# Patient Record
Sex: Male | Born: 1950 | Race: White | Hispanic: No | Smoking: Current every day smoker
Health system: Southern US, Community
[De-identification: ages and names within clinical notes are randomized; demographics above are authoritative.]

## PROBLEM LIST (undated history)

## (undated) DIAGNOSIS — L819 Disorder of pigmentation, unspecified: Secondary | ICD-10-CM

## (undated) DIAGNOSIS — R270 Ataxia, unspecified: Secondary | ICD-10-CM

## (undated) DIAGNOSIS — F329 Major depressive disorder, single episode, unspecified: Secondary | ICD-10-CM

## (undated) DIAGNOSIS — I739 Peripheral vascular disease, unspecified: Secondary | ICD-10-CM

## (undated) DIAGNOSIS — M4640 Discitis, unspecified, site unspecified: Secondary | ICD-10-CM

## (undated) DIAGNOSIS — E7800 Pure hypercholesterolemia, unspecified: Secondary | ICD-10-CM

## (undated) DIAGNOSIS — F411 Generalized anxiety disorder: Secondary | ICD-10-CM

## (undated) DIAGNOSIS — M79606 Pain in leg, unspecified: Secondary | ICD-10-CM

## (undated) DIAGNOSIS — F32A Depression, unspecified: Secondary | ICD-10-CM

## (undated) DIAGNOSIS — G8929 Other chronic pain: Secondary | ICD-10-CM

## (undated) DIAGNOSIS — F17200 Nicotine dependence, unspecified, uncomplicated: Secondary | ICD-10-CM

## (undated) DIAGNOSIS — R7301 Impaired fasting glucose: Secondary | ICD-10-CM

## (undated) DIAGNOSIS — I10 Essential (primary) hypertension: Secondary | ICD-10-CM

## (undated) DIAGNOSIS — M549 Dorsalgia, unspecified: Secondary | ICD-10-CM

## (undated) DIAGNOSIS — T847XXA Infection and inflammatory reaction due to other internal orthopedic prosthetic devices, implants and grafts, initial encounter: Secondary | ICD-10-CM

## (undated) DIAGNOSIS — H3320 Serous retinal detachment, unspecified eye: Secondary | ICD-10-CM

## (undated) DIAGNOSIS — D126 Benign neoplasm of colon, unspecified: Secondary | ICD-10-CM

## (undated) DIAGNOSIS — R030 Elevated blood-pressure reading, without diagnosis of hypertension: Secondary | ICD-10-CM

## (undated) DIAGNOSIS — G5640 Causalgia of unspecified upper limb: Secondary | ICD-10-CM

## (undated) DIAGNOSIS — M1990 Unspecified osteoarthritis, unspecified site: Secondary | ICD-10-CM

## (undated) DIAGNOSIS — L270 Generalized skin eruption due to drugs and medicaments taken internally: Secondary | ICD-10-CM

## (undated) HISTORY — DX: Nicotine dependence, unspecified, uncomplicated: F17.200

## (undated) HISTORY — PX: ANKLE SURGERY: SHX546

## (undated) HISTORY — DX: Depression, unspecified: F32.A

## (undated) HISTORY — DX: Pure hypercholesterolemia, unspecified: E78.00

## (undated) HISTORY — DX: Causalgia of unspecified upper limb: G56.40

## (undated) HISTORY — DX: Disorder of pigmentation, unspecified: L81.9

## (undated) HISTORY — DX: Infection and inflammatory reaction due to other internal orthopedic prosthetic devices, implants and grafts, initial encounter: T84.7XXA

## (undated) HISTORY — DX: Major depressive disorder, single episode, unspecified: F32.9

## (undated) HISTORY — PX: APPENDECTOMY: SHX54

## (undated) HISTORY — DX: Benign neoplasm of colon, unspecified: D12.6

## (undated) HISTORY — PX: COLONOSCOPY: SHX174

## (undated) HISTORY — DX: Elevated blood-pressure reading, without diagnosis of hypertension: R03.0

## (undated) HISTORY — PX: KNEE SURGERY: SHX244

## (undated) HISTORY — DX: Essential (primary) hypertension: I10

## (undated) HISTORY — DX: Peripheral vascular disease, unspecified: I73.9

## (undated) HISTORY — DX: Impaired fasting glucose: R73.01

## (undated) HISTORY — PX: BACK SURGERY: SHX140

## (undated) HISTORY — DX: Serous retinal detachment, unspecified eye: H33.20

## (undated) HISTORY — DX: Generalized anxiety disorder: F41.1

## (undated) HISTORY — DX: Generalized skin eruption due to drugs and medicaments taken internally: L27.0

## (undated) HISTORY — DX: Discitis, unspecified, site unspecified: M46.40

---

## 2001-07-11 ENCOUNTER — Encounter: Admission: RE | Admit: 2001-07-11 | Discharge: 2001-07-11 | Payer: Self-pay | Admitting: Family Medicine

## 2001-07-11 ENCOUNTER — Encounter: Payer: Self-pay | Admitting: Family Medicine

## 2002-05-04 ENCOUNTER — Encounter: Payer: Self-pay | Admitting: Orthopaedic Surgery

## 2002-05-04 ENCOUNTER — Ambulatory Visit (HOSPITAL_COMMUNITY): Admission: RE | Admit: 2002-05-04 | Discharge: 2002-05-04 | Payer: Self-pay | Admitting: Orthopaedic Surgery

## 2002-07-10 ENCOUNTER — Ambulatory Visit (HOSPITAL_BASED_OUTPATIENT_CLINIC_OR_DEPARTMENT_OTHER): Admission: RE | Admit: 2002-07-10 | Discharge: 2002-07-11 | Payer: Self-pay | Admitting: Orthopedic Surgery

## 2002-10-09 ENCOUNTER — Ambulatory Visit (HOSPITAL_BASED_OUTPATIENT_CLINIC_OR_DEPARTMENT_OTHER): Admission: RE | Admit: 2002-10-09 | Discharge: 2002-10-09 | Payer: Self-pay | Admitting: Orthopedic Surgery

## 2002-12-11 ENCOUNTER — Encounter (HOSPITAL_COMMUNITY): Admission: RE | Admit: 2002-12-11 | Discharge: 2003-01-10 | Payer: Self-pay | Admitting: *Deleted

## 2003-05-14 ENCOUNTER — Encounter: Admission: RE | Admit: 2003-05-14 | Discharge: 2003-06-25 | Payer: Self-pay | Admitting: *Deleted

## 2004-11-04 ENCOUNTER — Ambulatory Visit (HOSPITAL_COMMUNITY): Admission: RE | Admit: 2004-11-04 | Discharge: 2004-11-04 | Payer: Self-pay | Admitting: Gastroenterology

## 2009-06-05 ENCOUNTER — Emergency Department (HOSPITAL_COMMUNITY): Admission: EM | Admit: 2009-06-05 | Discharge: 2009-06-05 | Payer: Self-pay | Admitting: Emergency Medicine

## 2010-05-22 ENCOUNTER — Emergency Department (HOSPITAL_COMMUNITY)
Admission: EM | Admit: 2010-05-22 | Discharge: 2010-05-22 | Payer: Self-pay | Source: Home / Self Care | Admitting: Emergency Medicine

## 2010-07-02 ENCOUNTER — Encounter: Payer: Medicare Other | Attending: Family Medicine | Admitting: *Deleted

## 2010-07-02 DIAGNOSIS — Z713 Dietary counseling and surveillance: Secondary | ICD-10-CM | POA: Insufficient documentation

## 2010-07-02 DIAGNOSIS — R7301 Impaired fasting glucose: Secondary | ICD-10-CM | POA: Insufficient documentation

## 2010-07-23 LAB — POCT CARDIAC MARKERS
CKMB, poc: 2.2 ng/mL (ref 1.0–8.0)
Troponin i, poc: 0.07 ng/mL (ref 0.00–0.09)

## 2010-07-23 LAB — BASIC METABOLIC PANEL
BUN: 9 mg/dL (ref 6–23)
CO2: 30 mEq/L (ref 19–32)
Calcium: 9.3 mg/dL (ref 8.4–10.5)
Chloride: 103 mEq/L (ref 96–112)
Creatinine, Ser: 0.96 mg/dL (ref 0.4–1.5)
GFR calc Af Amer: >60 mL/min (ref >60)
GFR calc non Af Amer: >60 mL/min (ref >60)
Glucose, Bld: 100 mg/dL — ABNORMAL HIGH (ref 70–99)
Potassium: 3.4 mEq/L — ABNORMAL LOW (ref 3.5–5.1)
Sodium: 140 mEq/L (ref 135–145)

## 2010-07-23 LAB — CBC
Hemoglobin: 14.6 g/dL (ref 13.0–17.0)
MCHC: 33.9 g/dL (ref 30.0–36.0)
RBC: 4.41 MIL/uL (ref 4.22–5.81)
WBC: 6 10*3/uL (ref 4.0–10.5)

## 2010-07-23 LAB — DIFFERENTIAL
Basophils Absolute: 0 10*3/uL (ref 0.0–0.1)
Basophils Relative: 1 % (ref 0–1)
Lymphocytes Relative: 37 % (ref 12–46)
Monocytes Relative: 11 % (ref 3–12)
Neutro Abs: 3 10*3/uL (ref 1.7–7.7)
Neutrophils Relative %: 50 % (ref 43–77)

## 2010-07-23 LAB — HEPATIC FUNCTION PANEL
ALT: 19 U/L (ref 0–53)
AST: 24 U/L (ref 0–37)
Albumin: 4.1 g/dL (ref 3.5–5.2)
Total Bilirubin: 0.4 mg/dL (ref 0.3–1.2)

## 2010-07-23 LAB — PROTIME-INR: INR: 1 (ref 0.00–1.49)

## 2010-08-11 ENCOUNTER — Encounter: Payer: Medicare Other | Attending: Family Medicine | Admitting: *Deleted

## 2010-08-11 DIAGNOSIS — Z713 Dietary counseling and surveillance: Secondary | ICD-10-CM | POA: Insufficient documentation

## 2010-08-11 DIAGNOSIS — R7301 Impaired fasting glucose: Secondary | ICD-10-CM | POA: Insufficient documentation

## 2010-09-10 ENCOUNTER — Encounter: Payer: Medicare Other | Attending: Family Medicine | Admitting: *Deleted

## 2010-09-10 DIAGNOSIS — R7301 Impaired fasting glucose: Secondary | ICD-10-CM | POA: Insufficient documentation

## 2010-09-10 DIAGNOSIS — Z713 Dietary counseling and surveillance: Secondary | ICD-10-CM | POA: Insufficient documentation

## 2010-09-18 NOTE — Op Note (Signed)
NAMESOUA, CALTAGIRONE                        ACCOUNT NO.:  [id number]   MEDICAL RECORD NO.:  [id number]                   PATIENT TYPE:  AMB   LOCATION:  DSC                                  FACILITY:  [facility]   PHYSICIAN:  Leonides Grills, M.D.                  DATE OF BIRTH:  1951/05/03   DATE OF PROCEDURE:  07/10/2002  DATE OF DISCHARGE:                                 OPERATIVE REPORT   PREOPERATIVE DIAGNOSES:  1. Left ankle instability.  2. Left cavus foot.  3. Left tight gastrocnemius.  4. Anterolateral ankle impingement syndrome.   POSTOPERATIVE DIAGNOSES:  1. Left ankle instability.  2. Left cavus foot.  3. Left tight gastrocnemius.  4. Anterolateral ankle impingement syndrome.   OPERATION PERFORMED:  1. Left modified Brostrom procedure.  2. Left dorsiflexion first metatarsal osteotomy.  3. Left gastroc slide.  4. Left lateralizing calcaneal osteotomy.  5. Excision of accessory tib-fib ligament.   SURGEON:  Leonides Grills, M.D.   ANESTHESIA:  General endotracheal tube.   ASSISTANT:  Lianne Cure, P.A.   ESTIMATED BLOOD LOSS:  Minimal.   TOURNIQUET TIME:  Approximately an hour and 45 minutes.   COMPLICATIONS:  None.   DISPOSITION:  Stable to the PR.   INDICATIONS FOR PROCEDURE:  The patient is a 60 year old gentleman who has  had longstanding left ankle instability despite physical therapy and  conservative management.  He was consented for the above procedure.  All  risks which include infection, neurovascular injury, recurrence of  instability, nonunion, malunion, hardware irritation, hardware failure,  persistent pain, worsening of pain, DVT or possible PE again were all  explained, questions encouraged and answered.   DESCRIPTION OF PROCEDURE:  The patient was brought to the operating room and  placed in supine position after adequate endotracheal tube anesthesia was  administered as well as vancomycin 500 mg IV piggyback.  The left lower  extremity  was prepped and draped in sterile manner with proximally placed  thigh tourniquet after the patient was placed in a sloppy lateral position  with the left operative side up.  We started the procedure with a medial  incision over the medial border of the gastroc musculotendinous junction.  Dissection was carried down to fascia.  The fascia was opened in line with  the incision.  The gastrocnemius tendon was identified, soft tissues then  elevated off the posterior aspect of the gastroc tendon.  Sural nerve was  identified and protected posteriorly.  The conjoined region between the  gastroc soleus was then developed and with two knee retractors placed, using  Mayo scissors, the gastrocnemius was then released.  This had an excellent  release of a tight gastroc.  The wound was copiously irrigated with normal  saline.  The subcu was closed with 3-0 Vicryl, the skin was closed with 4-0  Monocryl stitch.  We then gravity exsanguinated the left lower extremity and  the tourniquet was elevated to 290 mmHg.  We started the procedure with a  longitudinal incision lateral to the EHL tendon.  Dissection was carried  down lateral to the EHL tendon.  The periosteum was incised in line with the  incision as well.  The first tarsometatarsal joint was identified and  approximately 5 to 8 mm distal to this, a closing wedge dorsally based  osteotomy was then made.  This was then closed down and held with a two-  point reduction clamp.  A bur was then used to create a notch approximately  1.5 cm distal to the osteotomy and a 2.5 mm drill hole was then placed.  This was a set screw.  A 3.5 mm fully threaded cortical 45 mm long set screw  was then placed.  This had excellent maintenance of the compression across  the osteotomy site.  We then made a curvilinear incision over the  anterolateral aspect of the ankle.  Dissection was carried down through  skin.  A branch of the superficial peroneal nerve was  identified and  protected throughout the main portion of the case.  The extensor retinaculum  was identified and was freed up for later advancement to the anterior aspect  of the fibula.  The capsule was then opened in a curvilinear fashion and the  accessory tib-fib ligament was identified.  The ankle was then ranged and  was impinging on the anterolateral aspect of the ankle.  The accessory tib-  fib ligament was excised.  Once the accessory tib-fib ligament was identified, the syndesmosis was then  visualized.  With the ankle in full dorsiflexion, this adequately translated  2 to 3 mm.  No scar tissue within this area was identified.  The lateral  gutter was then visualized.  There was spur off the anterolateral aspect of  the fibula which was then debrided with the rongeur.  Once this was done,  the wound was copiously irrigated with normal saline.  A trough was then  made in the anterior aspect of the fibula.  The anterior talofibular  ligament and calcaneofibular ligament with its capsule was then advanced in  a vest over pants manner once 2-0 FiberWire suture anchors were placed  within the fibular trough.  Once this was advanced and repaired, this had an  excellent repair of the ligaments.  We then advanced the extensor  retinaculum to the fibula.  Again, this was repaired with 2-0 FiberWire.  Once this was done, the wound was copiously irrigated with normal saline.  The ankle was ranged and had excellent range of motion.  We then copiously  irrigated the wound with normal saline.  The subcu was closed with 3-0  Vicryl and the skin was closed with 4-0 nylon. We then visualized the heel  and it was still in varus.  Because of this, we did a lateralizing calcaneal  osteotomy.  Through a longitudinal incision over the lateral aspect of the  calcaneal tuber, dissection was carried down to bone.  Soft tissue was elevated both superiorly and inferiorly.  Hohmann's were then placed, then   using the 4000 saw, the osteotomy was then made.  A large straight osteotome  was then placed as well as a large lamina spreader.  The heel was then  translated laterally provisionally held with a 2-0 K-wire and then through a  longitudinal incision, the midline heel area, two 6.5 mm partially threaded  cancellous screws were then placed.  This had excellent maintenance  of  compression across the osteotomy site.  J-wire was then removed.  Final x-  rays in AP and lateral of the foot showed excellent alignment of the  dorsiflexion first metatarsal osteotomy and axillary and lateral views of  the _____ were obtained and showed excellent alignment of the calcaneal  fixation.  The wounds were copiously irrigated with normal saline.  Skin was  closed with 4-0 nylon.  Sterile dressing was applied.  Steri-Strips were  applied to the gastroc slide.  A modified Jones dressing was applied with  the ankle in neutral dorsiflexion.  The patient was stable to the PR.                                               Leonides Grills, M.D.    PB/MEDQ  D:  07/10/2002  T:  07/10/2002  Job:  [id number]

## 2010-09-18 NOTE — Op Note (Signed)
NAME:  Marc Rose, Marc Rose                        ACCOUNT NO.:  [id number]   MEDICAL RECORD NO.:  [id number]                   PATIENT TYPE:  AMB   LOCATION:  DSC                                  FACILITY:  [facility]   PHYSICIAN:  Leonides Grills, M.D.                  DATE OF BIRTH:  27-Nov-1950   DATE OF PROCEDURE:  10/09/2002  DATE OF DISCHARGE:                                 OPERATIVE REPORT   PREOPERATIVE DIAGNOSIS:  Complication of hardware, left foot, deep, status  post cavus foot reconstruction.   POSTOPERATIVE DIAGNOSIS:  Complication of hardware, left foot, deep, status  post cavus foot reconstruction.   OPERATION:  Hardware removal, deep, left foot.   ANESTHESIA:  General endotracheal tube.   SURGEON:  Leonides Grills, M.D.   ASSISTANT:  Lianne Cure, P.A.   ESTIMATED BLOOD LOSS:  Minimal.   TOURNIQUET:  None.   COMPLICATIONS:  None.   DISPOSITION:  Stable to PAR.   INDICATIONS:  This is a 60 year old male who underwent cavus foot  reconstructive procedure with a lateralizing calcaneal osteotomy and  dorsiflexion first metatarsal osteotomy with a modified Brostrom procedure  for ankle instability.  He has been doing well.  He now presents for  hardware removal.  He was consented for the above procedure.  All risks,  which include infection, nerve or vessel injury, nonunion, malunion,  recurrence of instability, were all explained, questions were encouraged and  answered.   OPERATION:  The patient was brought to the operating room and placed in the  supine position after adequate general endotracheal tube anesthesia was  administered as well as vancomycin 500 mg IV piggyback.  The left lower  extremity was then prepped and draped in a sterile manner.  A longitudinal  incision through the previous incision over the heel was then made,  dissection was carried down to screw heads.  Screw heads were identified and  debrided of soft tissue.  Then with the large fragment  screwdriver, the two  calcaneal screws were then removed.  They were intact without any evidence  of hardware failure.  Then through a small incision through the distal  aspect of the dorsal medial foot wound, an incision was made, dissection was  carried down bluntly to the screw head, and with the small fragment  screwdriver, the screw was removed.  The screw was also intact, no evidence  of hardware failure.  The wounds were copiously irrigated with normal  saline.  Skin was closed with 4-0 nylon.  A sterile dressing was applied, a  hard-soled shoe was applied.  Patient stable to the PAR.                                              Leonides Grills, M.D.  PB/MEDQ  D:  10/09/2002  T:  10/09/2002  Job:  [id number]

## 2010-09-18 NOTE — Op Note (Signed)
NAMEKRIST, Marc Rose              ACCOUNT NO.:  [id number]   MEDICAL RECORD NO.:  [id number]          PATIENT TYPE:  AMB   LOCATION:  ENDO                         FACILITY:  [facility]   PHYSICIAN:  Anselmo Rod, M.D.  DATE OF BIRTH:  09-08-1950   DATE OF PROCEDURE:  11/04/2004  DATE OF DISCHARGE:                                 OPERATIVE REPORT   PROCEDURE:  Screening colonoscopy.   ENDOSCOPIST:  Anselmo Rod, M.D.   INSTRUMENT USED:  Olympus video colonoscope.   INDICATIONS FOR PROCEDURE:  This 60 year old white male underwent a  screening colonoscopy to rule ou colonic polyps, masses, etc.   PRE-PROCEDURE PREPARATION:  An informed consent was procured from the  patient.  The patient was fasted for eight hours prior to the procedure and  prepped with a bottle of magnesium citrate and one gallon of GoLYTELY on the  night prior to the procedure.  The risks and benefits of the procedure,  including a 10% mis-rate of cancer and polyps was discussed with him as  well.   PRE-PROCEDURE PHYSICAL EXAMINATION:  VITAL SIGNS:  Stable.  NECK:  Supple.  CHEST:  Clear to auscultation.  HEART:  S1 and S2 regular.  ABDOMEN:  Soft, with normal bowel sounds.   DESCRIPTION OF PROCEDURE:  The patient was placed in the left lateral  decubitus position and sedated with 75 mg of Demerol and 10 mg of Versed in  slow incremental doses.  Once the patient was adequately sedated and  maintained on low-flow oxygen and continuous cardiac monitoring, the Olympus  video colonoscope was advanced from the rectum to the cecum with difficulty.  There was a large amount of residual stool in the colon.  Multiple washings  were done.  The patient's position was changed from the left lateral to the  supine position to view the cecum.  The appendicular orifice and ileocecal  valve were visualized and photographed.  Multiple washings were done.  No  masses, polyps, erosions, ulcerations or diverticula were seen.   Small  lesions could have been missed.  Retroflexion in the rectum revealed small  internal hemorrhoids.   The patient tolerated the procedure well without complications.   IMPRESSION:  1.  Normal colonoscopy to the cecum except for small internal hemorrhoids.      No masses, polyps or diverticula seen.  2.  Significant amount of residual stool in the colon.  Multiple washings      done.  Small lesions could have been missed.   RECOMMENDATIONS:  1.  Continue on a high-fiber diet with liberal fluid intake.  2.  Repeat colonoscopy in the next 10 years, unless the patient develops any      abnormal symptoms in the interim.  3.  Outpatient followup as the need arises in the future.      Anselmo Rod, M.D.  Electronically Signed     JNM/MEDQ  D:  11/04/2004  T:  11/04/2004  Job:  [id number]   cc:   Marjory Lies, M.D.  [address]  [place]  [place] [address]  Fax: [phone]

## 2010-11-02 ENCOUNTER — Encounter: Payer: Medicare HMO | Attending: Family Medicine | Admitting: *Deleted

## 2010-11-02 DIAGNOSIS — R7301 Impaired fasting glucose: Secondary | ICD-10-CM | POA: Insufficient documentation

## 2010-11-02 DIAGNOSIS — Z713 Dietary counseling and surveillance: Secondary | ICD-10-CM | POA: Insufficient documentation

## 2010-12-08 ENCOUNTER — Encounter: Payer: Medicare Other | Attending: Family Medicine | Admitting: *Deleted

## 2010-12-08 DIAGNOSIS — R7301 Impaired fasting glucose: Secondary | ICD-10-CM | POA: Insufficient documentation

## 2010-12-08 DIAGNOSIS — Z713 Dietary counseling and surveillance: Secondary | ICD-10-CM | POA: Insufficient documentation

## 2010-12-08 NOTE — Patient Instructions (Addendum)
Goals:   Continue previous nutrition goals.  Add 1 Tbsp MCT Oil 3 times a day at meals.  Make sure to get enough fluids daily to replace losses through sweat (2400 mL minimum).  Continue to watch carbohydrate and sodium intake.

## 2010-12-08 NOTE — Progress Notes (Signed)
  Medical Nutrition Therapy:  Appt start time:  0900  end time:  10:00.  Assessment:  Primary concerns today: Impaired fasting blood glucose; Follow up.  Pt here for follow up on impaired fasting glucose. FBGs running ~120 mg/dL per pt and last BP was 139/82.  Pt has h/o unintended weight loss and fatigue, and since first visit (07/02/10), pt has only maintained weight.  No weight gain reported even on highly increased calorie intake of ~2200-2400 cal/day. Both mother and sister have hyperthyroidism, but patient's labs negative per MD. Works p/t at [employer] and reports excessive sweating. No GI sx present and energy ok.    MEDICATIONS: Diazepam, Flexeril, Benazepril, Pravastatin, ASA (81 mg), Aleve (prn), Milk Thistle, Fish Oil  DIETARY INTAKE: 5-6 Ensure Plus and 3 meals/3 snacks daily.  Estimated energy needs: 2200-2400 calories 245-270 g carbohydrates 110-120 g protein 70-80 g fat  Progress Towards Goal(s):  Minimal progress.   Nutritional Diagnosis:  [place]-2.2 Altered nutrition-related laboratory related to glucose as evidenced by an A1c of 6.0% and MD referral for dietary counseling..    Intervention/Goals:   Continue previous nutrition goals.  Add 1 Tbsp MCT Oil 3 times a day at meals.  Make sure to get enough fluids daily to replace losses through sweat (2400 mL minimum).  Continue to watch carbohydrate and sodium intake.  Monitoring/Evaluation:  Dietary intake, exercise, A1c, and body weight in 4 week(s).

## 2010-12-09 ENCOUNTER — Encounter: Payer: Self-pay | Admitting: *Deleted

## 2011-01-05 ENCOUNTER — Encounter: Payer: Self-pay | Admitting: *Deleted

## 2011-01-05 ENCOUNTER — Encounter: Payer: Medicare Other | Attending: Family Medicine | Admitting: *Deleted

## 2011-01-05 DIAGNOSIS — Z713 Dietary counseling and surveillance: Secondary | ICD-10-CM | POA: Insufficient documentation

## 2011-01-05 DIAGNOSIS — E119 Type 2 diabetes mellitus without complications: Secondary | ICD-10-CM | POA: Insufficient documentation

## 2011-01-05 NOTE — Progress Notes (Signed)
Medical Nutrition Therapy:  Appt start time:  09:00a  End time:  10:00a.  Assessment:  Primary concerns today: Impaired fasting blood glucose; Follow up.  Pt here for follow up on impaired fasting glucose.  States last BP was 139/82 and HR 119 taken on machine at [facility]. Was drinking 5-6 Boost High Protein plus 2-3 CIB (no added sugar) daily and reports he gained up to 164 lbs. Pt d/c'd Boost last Tues. and now down to 152 lbs.  Pt is highly active (neg labs for hyperthyroid) in and out of work and has been skipping lunch since last visit.  Reports excessive sweating while active. Repletes with Gatorade G2 and water.  No GI sx present and energy ok, though c/o feet hurting and states, "It feels like I'm walking on rocks".  MEDICATIONS:  Diazepam, Flexeril, Benazepril, Pravastatin, ASA (81 mg), Aleve (prn), Milk Thistle, Fish Oil - No changes  DIETARY INTAKE: Stopped Boost; just drinking 2-3 CIB with NAS now; eating 2 meals/0-2+ snacks daily.  Estimated Energy Needs: 2400 calories 270 g carbohydrates 130 g protein 80 g fat  Progress Towards Goal(s):  Minimal progress.   Nutritional Diagnosis:  [place]-2.2 Altered nutrition-related laboratory related to CHO intake as evidenced by an A1c of 6.0% and MD referral for dietary counseling.    Intervention/Goals:   Continue previous nutrition goals - 2400 calories, 275 g carb, 130 g protein, 80 g fat daily.  Resume eating dinner daily - no meal skipping.   If no weight gain after resuming dinner, add Instant Breakfast (no sugar added) at snacks.  Make sure to get enough fluids daily to replace losses through sweat (80+ oz daily - minimum).  Continue to watch carbohydrate and sodium intake.  Recommend contacting MD regarding pain in shoulders and feet.    Monitoring/Evaluation:  Dietary intake, exercise, A1c, and body weight in 4-6 week(s).

## 2011-01-05 NOTE — Patient Instructions (Addendum)
Goals:   Continue previous nutrition goals - 2400 calories, 275 g carb, 130 g protein, 80 g fat daily.  Resume eating dinner daily - no meal skipping.   If no weight gain after resuming dinner, add Instant Breakfast (no sugar added) at snacks.  Make sure to get enough fluids daily to replace losses through sweat (80+ oz daily - minimum).  Continue to watch carbohydrate and sodium intake.  Take breaks between yard work to keep hydrated and have snacks.  Recommend contacting MD regarding pain in shoulders and feet.

## 2011-02-02 ENCOUNTER — Ambulatory Visit: Payer: Medicare HMO | Admitting: *Deleted

## 2011-02-16 ENCOUNTER — Encounter: Payer: Self-pay | Admitting: *Deleted

## 2011-02-16 ENCOUNTER — Encounter: Payer: Medicare Other | Attending: Family Medicine | Admitting: *Deleted

## 2011-02-16 DIAGNOSIS — Z713 Dietary counseling and surveillance: Secondary | ICD-10-CM | POA: Insufficient documentation

## 2011-02-16 DIAGNOSIS — R7301 Impaired fasting glucose: Secondary | ICD-10-CM | POA: Insufficient documentation

## 2011-02-16 NOTE — Progress Notes (Addendum)
Samples given: - CIB (NAS):  10 pkts  LOT: [id number] A Exp:  10/27/11

## 2011-02-16 NOTE — Patient Instructions (Addendum)
Goals:  Continue previous nutrition goals - 2400 calories, 275 g carb, 130 g protein, 80 g fat daily.  Add protein to all snacks and continue to aim for 2000 mg or less of sodium daily.  Follow up with MD regarding referral to Endocrinologist.   Call me if condition worsens.

## 2011-02-16 NOTE — Progress Notes (Addendum)
Medical Nutrition Therapy:  Appt start time:  08:00a  End time:  9:00a.  Primary concerns today: Impaired fasting blood glucose; Follow up.  Pt reports was up to 166 lbs with only CIB 3x/day. States will lose 3-4 lbs overnight; likely d/t intake of processed foods. States MD at [facility] said to d/c statins and supplements (1+ mo ago) and see Endocrinologist. Pt states resolution of pain in arms since off statin. Does c/o DM sx (tingling in feet and fingers; blurry vision). States average BP of 113/83 and HR ~78-80. Still walking ~2 miles/day and on feet all day at work.  MEDICATIONS:  No Pravastatin, Milk Thistle, or Fish Oil for over 1 mo (per MD at [facility])   DIETARY INTAKE: Still no Boost; just drinking 2-3 CIB with NAS now; eating 2 meals + 1 meal replacement bar /0-2+ snacks daily. Watching sodium. Eating fish, [employer] chicken filets (seasoned), sometimes cheeseburgers; no sodas - only G2 low calorie, water, milk.   Estimated Energy Needs: 2400 calories 270 g carbohydrates 130 g protein 80 g fat  Progress Towards Goal(s): Some progress.   Nutritional Diagnosis:  [place]-2.2 Altered nutrition-related laboratory related to CHO intake as evidenced by an A1c of 6.0% and MD referral for dietary counseling.    Intervention/Goals:   Continue previous nutrition goals - 2400 calories, 275 g carb, 130 g protein, 80 g fat daily.  Add protein to all snacks and continue to aim for 2000 mg or less of sodium daily.  Follow up with MD regarding referral to Endocrinologist.   Call me if condition worsens.  Monitoring/Evaluation:  Dietary intake, exercise, A1c, and body weight after appointment with MD.

## 2011-05-28 ENCOUNTER — Emergency Department (HOSPITAL_COMMUNITY)
Admission: EM | Admit: 2011-05-28 | Discharge: 2011-05-28 | Disposition: A | Discharge status: Home / Self Care | Payer: Medicare Other | Attending: Emergency Medicine | Admitting: Emergency Medicine

## 2011-05-28 ENCOUNTER — Encounter (HOSPITAL_COMMUNITY): Payer: Self-pay | Admitting: *Deleted

## 2011-05-28 DIAGNOSIS — M129 Arthropathy, unspecified: Secondary | ICD-10-CM | POA: Insufficient documentation

## 2011-05-28 DIAGNOSIS — M545 Low back pain, unspecified: Secondary | ICD-10-CM | POA: Insufficient documentation

## 2011-05-28 DIAGNOSIS — M25559 Pain in unspecified hip: Secondary | ICD-10-CM | POA: Insufficient documentation

## 2011-05-28 DIAGNOSIS — F17200 Nicotine dependence, unspecified, uncomplicated: Secondary | ICD-10-CM | POA: Insufficient documentation

## 2011-05-28 DIAGNOSIS — Z7982 Long term (current) use of aspirin: Secondary | ICD-10-CM | POA: Insufficient documentation

## 2011-05-28 DIAGNOSIS — E119 Type 2 diabetes mellitus without complications: Secondary | ICD-10-CM | POA: Insufficient documentation

## 2011-05-28 DIAGNOSIS — E7800 Pure hypercholesterolemia, unspecified: Secondary | ICD-10-CM | POA: Insufficient documentation

## 2011-05-28 DIAGNOSIS — F341 Dysthymic disorder: Secondary | ICD-10-CM | POA: Insufficient documentation

## 2011-05-28 DIAGNOSIS — I10 Essential (primary) hypertension: Secondary | ICD-10-CM | POA: Insufficient documentation

## 2011-05-28 DIAGNOSIS — M79605 Pain in left leg: Secondary | ICD-10-CM

## 2011-05-28 DIAGNOSIS — M79609 Pain in unspecified limb: Secondary | ICD-10-CM | POA: Insufficient documentation

## 2011-05-28 DIAGNOSIS — Z79899 Other long term (current) drug therapy: Secondary | ICD-10-CM | POA: Insufficient documentation

## 2011-05-28 HISTORY — DX: Unspecified osteoarthritis, unspecified site: M19.90

## 2011-05-28 HISTORY — DX: Other chronic pain: G89.29

## 2011-05-28 HISTORY — DX: Pain in leg, unspecified: M79.606

## 2011-05-28 MED ORDER — OXYCODONE-ACETAMINOPHEN 5-325 MG PO TABS: 1.0000 | ORAL_TABLET | ORAL | Status: AC | PRN

## 2011-05-28 MED ORDER — HYDROMORPHONE HCL PF 2 MG/ML IJ SOLN
2.0000 mg | Freq: Once | INTRAMUSCULAR | Status: AC
  Administered 2011-05-28: 2 mg via INTRAMUSCULAR
  Filled 2011-05-28: qty 1

## 2011-05-28 NOTE — ED Provider Notes (Signed)
History   This chart was scribed for Marc Co, MD by Clarita Crane. The patient was seen in room APA17/APA17 and the patient's care was started at 9:02AM.   CSN: [id number]  Arrival date & time 05/28/11  0845   First MD Initiated Contact with Patient 05/28/11 [phone]      Chief Complaint  Patient presents with  . Hip Pain    (Consider location/radiation/quality/duration/timing/severity/associated sxs/prior treatment) HPI ELFORD EVILSIZER is a 61 y.o. male who presents to the Emergency Department complaining of constant moderate to severe left hip pain radiating down LLE to level of left ankle onset 3 days ago and worsening since with associated lower back pain. Patient notes history of chronic left hip and LLE pain but notes severity of pain has increased over the past several days. Notes pain not relieved with application of heat. Denies recent injury/trauma, incontinence, urinary symptoms, fever, SOB. Patient with h/o left ankle fusion, chronic leg pain, diabetes, HTN.  PCP- Dr. Almira Bar  Past Medical History  Diagnosis Date  . Depression   . Impaired fasting glucose   . Hypertension   . Benign neoplasm of colon   . Unspecified retinal detachment   . Tobacco use disorder   . Anxiety state, unspecified   . Essential hypertension, benign   . Elevated blood pressure reading without diagnosis of hypertension   . Causalgia of upper limb     Left limb  . Pure hypercholesterolemia   . Lower back pain   . Diabetes mellitus   . Arthritis   . Chronic leg pain     Past Surgical History  Procedure Date  . Ankle surgery     left  . Knee surgery     left  . Appendectomy   . Colonoscopy     Family History  Problem Relation Age of Onset  . COPD Mother   . Hyperthyroidism Mother   . Hyperthyroidism Sister     History  Substance Use Topics  . Smoking status: Current Everyday Smoker -- 0.5 packs/day    Types: Cigarettes  . Smokeless tobacco: Not on file  . Alcohol Use: No        Review of Systems 10 Systems reviewed and are negative for acute change except as noted in the HPI.  Allergies  Codeine; Latex; and Penicillins  Home Medications   Current Outpatient Rx  Name Route Sig Dispense Refill  . ASPIRIN 81 MG PO TABS Oral Take 81 mg by mouth daily.      Marland Kitchen BENAZEPRIL HCL 40 MG PO TABS Oral Take 40 mg by mouth daily.      . CYCLOBENZAPRINE HCL 10 MG PO TABS Oral Take 10 mg by mouth 3 (three) times daily.      Marland Kitchen DIAZEPAM 10 MG PO TABS Oral Take 10 mg by mouth every 6 (six) hours.      Marland Kitchen MILK THISTLE PO Oral Take by mouth daily.      . ALEVE PO Oral Take by mouth as needed.      Marland Kitchen FISH OIL PO Oral Take by mouth daily.      Marland Kitchen PRAVASTATIN SODIUM 40 MG PO TABS Oral Take 40 mg by mouth daily.        BP 145/93  Pulse 100  Temp(Src) 97.7 F (36.5 C) (Oral)  Resp 20  Ht 6' (1.829 m)  Wt 161 lb (73.029 kg)  BMI 21.84 kg/m2  SpO2 100%  Physical Exam  Nursing note and vitals reviewed.  Constitutional: He is oriented to person, place, and time. He appears well-developed and well-nourished.  HENT:  Head: Normocephalic and atraumatic.  Eyes: EOM are normal.  Neck: Normal range of motion. Neck supple.  Pulmonary/Chest: Effort normal. No respiratory distress.  Musculoskeletal:       DP and PT pulses intact within LLE. Feet are symmetric in warmth and size. Pain with passive ROM of left knee.    Neurological: He is alert and oriented to person, place, and time. No sensory deficit.  Skin:       Chronic mottling of bilateral lower extremities.  Psychiatric: He has a normal mood and affect.    ED Course  Procedures (including critical care time)  DIAGNOSTIC STUDIES: Oxygen Saturation is 100% on room air, normal by my interpretation.    COORDINATION OF CARE: 9:12AM- Patient informed of current plan for treatment in ED and agrees with plan set forth at this time.  10:06AM- Patient notes LLE pain has improved mildly after administration of 2mg  Dilaudid.  Patient informed of intent to d/c home. Patient agrees with plan set forth at this time.   Labs Reviewed - No data to display No results found.   1. Leg pain, left       MDM  The patient reports left lower extremity pain.  Is no obvious to formally.  He has no swelling to suggest DVT.  He has no risk factors for DVT.  His pain radiates from his left back down to his thigh this may represent sciatica.  His had long-standing chronic pain in his left lower extremity and this may represent acute exacerbation of that.  He is only on NSAIDs at home for pain.  Has normal pulses in his left foot and thus this is nonischemic leg.  A secondary signs of infection.  He has mottling of his bilateral lower extremities this has been present for years.  His thighs and tibias are warm he does have cool feet bilaterally.  He has strong PT and DP pulses bilaterally that are equal.  His pain is improved in the ER.  His been instructed to followup with his primary care Dr.      I personally performed the services described in this documentation, which was scribed in my presence. The recorded information has been reviewed and considered.    Marc Co, MD 05/28/11 1013

## 2011-05-28 NOTE — ED Notes (Signed)
Pt c/o chronic pain to left hip/leg x 3 days ago.  Denies new injury.

## 2011-05-28 NOTE — ED Notes (Signed)
Pt states pain med has not helped much

## 2013-03-15 ENCOUNTER — Other Ambulatory Visit: Payer: Self-pay | Admitting: Family Medicine

## 2013-03-15 ENCOUNTER — Ambulatory Visit
Admission: RE | Admit: 2013-03-15 | Discharge: 2013-03-15 | Disposition: A | Discharge status: Home / Self Care | Payer: Medicare Other | Source: Ambulatory Visit | Attending: Family Medicine | Admitting: Family Medicine

## 2013-03-15 DIAGNOSIS — R319 Hematuria, unspecified: Secondary | ICD-10-CM

## 2013-03-15 DIAGNOSIS — R1031 Right lower quadrant pain: Secondary | ICD-10-CM

## 2013-03-15 DIAGNOSIS — R1011 Right upper quadrant pain: Secondary | ICD-10-CM

## 2013-04-06 ENCOUNTER — Emergency Department (HOSPITAL_COMMUNITY)
Admission: EM | Admit: 2013-04-06 | Discharge: 2013-04-06 | Disposition: A | Discharge status: Home / Self Care | Payer: Medicare Other | Attending: Emergency Medicine | Admitting: Emergency Medicine

## 2013-04-06 ENCOUNTER — Emergency Department (HOSPITAL_COMMUNITY): Payer: Medicare Other

## 2013-04-06 ENCOUNTER — Encounter (HOSPITAL_COMMUNITY): Payer: Self-pay | Admitting: Emergency Medicine

## 2013-04-06 DIAGNOSIS — G5640 Causalgia of unspecified upper limb: Secondary | ICD-10-CM | POA: Insufficient documentation

## 2013-04-06 DIAGNOSIS — E119 Type 2 diabetes mellitus without complications: Secondary | ICD-10-CM | POA: Insufficient documentation

## 2013-04-06 DIAGNOSIS — K5900 Constipation, unspecified: Secondary | ICD-10-CM | POA: Insufficient documentation

## 2013-04-06 DIAGNOSIS — G8929 Other chronic pain: Secondary | ICD-10-CM | POA: Insufficient documentation

## 2013-04-06 DIAGNOSIS — E7800 Pure hypercholesterolemia, unspecified: Secondary | ICD-10-CM | POA: Insufficient documentation

## 2013-04-06 DIAGNOSIS — Z9889 Other specified postprocedural states: Secondary | ICD-10-CM | POA: Insufficient documentation

## 2013-04-06 DIAGNOSIS — M129 Arthropathy, unspecified: Secondary | ICD-10-CM | POA: Insufficient documentation

## 2013-04-06 DIAGNOSIS — Z79899 Other long term (current) drug therapy: Secondary | ICD-10-CM | POA: Insufficient documentation

## 2013-04-06 DIAGNOSIS — F17200 Nicotine dependence, unspecified, uncomplicated: Secondary | ICD-10-CM | POA: Insufficient documentation

## 2013-04-06 DIAGNOSIS — M79609 Pain in unspecified limb: Secondary | ICD-10-CM | POA: Insufficient documentation

## 2013-04-06 DIAGNOSIS — F411 Generalized anxiety disorder: Secondary | ICD-10-CM | POA: Insufficient documentation

## 2013-04-06 DIAGNOSIS — Z791 Long term (current) use of non-steroidal anti-inflammatories (NSAID): Secondary | ICD-10-CM | POA: Insufficient documentation

## 2013-04-06 DIAGNOSIS — Z7982 Long term (current) use of aspirin: Secondary | ICD-10-CM | POA: Insufficient documentation

## 2013-04-06 DIAGNOSIS — Z860100 Personal history of colon polyps, unspecified: Secondary | ICD-10-CM | POA: Insufficient documentation

## 2013-04-06 DIAGNOSIS — Z91040 Latex allergy status: Secondary | ICD-10-CM | POA: Insufficient documentation

## 2013-04-06 DIAGNOSIS — I10 Essential (primary) hypertension: Secondary | ICD-10-CM | POA: Insufficient documentation

## 2013-04-06 DIAGNOSIS — Z880 Allergy status to penicillin: Secondary | ICD-10-CM | POA: Insufficient documentation

## 2013-04-06 LAB — CBC WITH DIFFERENTIAL/PLATELET
Basophils Absolute: 0.1 10*3/uL (ref 0.0–0.1)
Basophils Relative: 1 % (ref 0–1)
MCHC: 34.8 g/dL (ref 30.0–36.0)
Neutro Abs: 4.7 10*3/uL (ref 1.7–7.7)
Neutrophils Relative %: 65 % (ref 43–77)
RDW: 13.8 % (ref 11.5–15.5)
WBC: 7.2 10*3/uL (ref 4.0–10.5)

## 2013-04-06 LAB — COMPREHENSIVE METABOLIC PANEL
ALT: 16 U/L (ref 0–53)
AST: 21 U/L (ref 0–37)
Albumin: 4.1 g/dL (ref 3.5–5.2)
Alkaline Phosphatase: 59 U/L (ref 39–117)
Chloride: 103 mEq/L (ref 96–112)
Potassium: 4.5 mEq/L (ref 3.5–5.1)
Sodium: 141 mEq/L (ref 135–145)
Total Bilirubin: 0.6 mg/dL (ref 0.3–1.2)

## 2013-04-06 LAB — URINALYSIS, ROUTINE W REFLEX MICROSCOPIC
Glucose, UA: NEGATIVE mg/dL
Hgb urine dipstick: NEGATIVE
Ketones, ur: NEGATIVE mg/dL
Protein, ur: NEGATIVE mg/dL

## 2013-04-06 MED ORDER — SODIUM CHLORIDE 0.9 % IV BOLUS (SEPSIS)
1000.0000 mL | Freq: Once | INTRAVENOUS | Status: AC
  Administered 2013-04-06: 1000 mL via INTRAVENOUS

## 2013-04-06 MED ORDER — DOCUSATE SODIUM 100 MG PO CAPS
100.0000 mg | ORAL_CAPSULE | Freq: Two times a day (BID) | ORAL | Status: DC
Start: 2013-04-06 — End: 2013-04-29

## 2013-04-06 MED ORDER — KETOROLAC TROMETHAMINE 30 MG/ML IJ SOLN
30.0000 mg | Freq: Once | INTRAMUSCULAR | Status: AC
  Administered 2013-04-06: 30 mg via INTRAVENOUS
  Filled 2013-04-06: qty 1

## 2013-04-06 MED ORDER — MAGNESIUM CITRATE PO SOLN: 1.0000 | Freq: Once | ORAL | Status: DC

## 2013-04-06 MED ORDER — METRONIDAZOLE 500 MG PO TABS
500.0000 mg | ORAL_TABLET | Freq: Once | ORAL | Status: AC
  Administered 2013-04-06: 500 mg via ORAL
  Filled 2013-04-06: qty 1

## 2013-04-06 NOTE — ED Provider Notes (Signed)
CSN: [id number]     Arrival date & time 04/06/13  0944 History   First MD Initiated Contact with Patient 04/06/13 [phone]     Chief Complaint  Patient presents with  . Abdominal Pain    HPI  Pt seen by PCP 3 weeks ago with right mid abdominal pain.  Had trace HB on U-dip, but acellular UA.  Ct showed large stool burden. Given Mirilax, but has had continued poor stool output.  Continues with RT mid AP. colicky and intermittent. No Nausea or vomiting.  He states that it will occasionally double him over and then seemed to release and go away   Past Medical History  Diagnosis Date  . Depression   . Impaired fasting glucose   . Hypertension   . Benign neoplasm of colon   . Unspecified retinal detachment   . Tobacco use disorder   . Anxiety state, unspecified   . Essential hypertension, benign   . Elevated blood pressure reading without diagnosis of hypertension   . Causalgia of upper limb     Left limb  . Pure hypercholesterolemia   . Lower back pain   . Diabetes mellitus   . Arthritis   . Chronic leg pain    Past Surgical History  Procedure Laterality Date  . Ankle surgery      left  . Knee surgery      left  . Appendectomy    . Colonoscopy     Family History  Problem Relation Age of Onset  . COPD Mother   . Hyperthyroidism Mother   . Hyperthyroidism Sister    History  Substance Use Topics  . Smoking status: Current Every Day Smoker -- 0.50 packs/day    Types: Cigarettes  . Smokeless tobacco: Not on file  . Alcohol Use: No    Review of Systems  Constitutional: Negative for fever, chills, diaphoresis, appetite change and fatigue.  HENT: Negative for mouth sores, sore throat and trouble swallowing.   Eyes: Negative for visual disturbance.  Respiratory: Negative for cough, chest tightness, shortness of breath and wheezing.   Cardiovascular: Negative for chest pain.  Gastrointestinal: Positive for abdominal pain and constipation. Negative for nausea, vomiting,  diarrhea and abdominal distention.  Endocrine: Negative for polydipsia, polyphagia and polyuria.  Genitourinary: Negative for dysuria, frequency and hematuria.  Musculoskeletal: Negative for gait problem.  Skin: Negative for color change, pallor and rash.  Neurological: Negative for dizziness, syncope, light-headedness and headaches.  Hematological: Does not bruise/bleed easily.  Psychiatric/Behavioral: Negative for behavioral problems and confusion.    Allergies  Codeine; Penicillins; Latex; and Strawberry  Home Medications   Current Outpatient Rx  Name  Route  Sig  Dispense  Refill  . aspirin EC 81 MG tablet   Oral   Take 81 mg by mouth daily.         . benazepril (LOTENSIN) 40 MG tablet   Oral   Take 40 mg by mouth every evening.          . clobetasol cream (TEMOVATE) 0.05 %   Topical   Apply 1 application topically daily as needed (for skin irritation).         . cyclobenzaprine (FLEXERIL) 10 MG tablet   Oral   Take 10 mg by mouth 6 (six) times daily.          . diazepam (VALIUM) 10 MG tablet   Oral   Take 10 mg by mouth every 6 (six) hours as needed for anxiety.          Marland Kitchen  naproxen sodium (ANAPROX) 220 MG tablet   Oral   Take 220 mg by mouth 2 (two) times daily with a meal.         . pravastatin (PRAVACHOL) 20 MG tablet   Oral   Take 20 mg by mouth every evening.         . pregabalin (LYRICA) 75 MG capsule   Oral   Take 75 mg by mouth 2 (two) times daily.          BP 141/92  Pulse 78  Temp(Src) 98.3 F (36.8 C)  Resp 18  Ht 6\' 2"  (1.88 m)  Wt 158 lb (71.668 kg)  BMI 20.28 kg/m2  SpO2 97% Physical Exam  Constitutional: He is oriented to person, place, and time. He appears well-developed and well-nourished. No distress.  HENT:  Head: Normocephalic.  Eyes: Conjunctivae are normal. Pupils are equal, round, and reactive to light. No scleral icterus.  Neck: Normal range of motion. Neck supple. No thyromegaly present.  Cardiovascular:  Normal rate and regular rhythm.  Exam reveals no gallop and no friction rub.   No murmur heard. Pulmonary/Chest: Effort normal and breath sounds normal. No respiratory distress. He has no wheezes. He has no rales.  Abdominal: Soft. Bowel sounds are normal. He exhibits no distension. There is no tenderness. There is no rebound.  Abdomen soft and benign. No peritoneal irritation. He is currently asymptomatic.  Musculoskeletal: Normal range of motion.  Neurological: He is alert and oriented to person, place, and time.  Skin: Skin is warm and dry. No rash noted.  Psychiatric: He has a normal mood and affect. His behavior is normal.    ED Course  Procedures (including critical care time) Labs Review Labs Reviewed  CBC WITH DIFFERENTIAL  COMPREHENSIVE METABOLIC PANEL  URINALYSIS, ROUTINE W REFLEX MICROSCOPIC   Imaging Review Ct Abdomen Pelvis Wo Contrast  04/06/2013   CLINICAL DATA:  Right flank pain, hematuria  EXAM: CT ABDOMEN AND PELVIS WITHOUT CONTRAST  TECHNIQUE: Multidetector CT imaging of the abdomen and pelvis was performed following the standard protocol without intravenous contrast.  COMPARISON:  03/15/2013  FINDINGS: Mild hypoventilation is appreciated within the lung bases.  Noncontrast evaluation of the liver, spleen, adrenals, pancreas is unremarkable. Small bilateral extrarenal pelvises identified within the kidneys. There is no evidence of hydronephrosis, nephrolithiasis, hydroureter, nor ureteral lithiasis. There does not appear to be evidence of an abdominal aortic aneurysm. Areas of the aorta are obscured by volume averaging with surrounding non attenuated soft tissue. Scattered punctate areas of atherosclerotic calcifications appreciated within the aorta.  There is no evidence of bowel obstruction nor secondary signs reflecting enteritis, colitis or diverticulitis. A moderate to large amount of fecal retention is appreciated within the colon. The urinary bladder is distended.   IMPRESSION: No CT evidence of obstructive or inflammatory abnormalities. Specifically there is no evidence of hydronephrosis, nephrolithiasis, nor ureteral lithiasis. Moderate amount of fecal retention within the colon. When compared to the previous study slightly decreased.   Electronically Signed   By: Salome Holmes M.D.   On: 04/06/2013 11:03    EKG Interpretation   None       MDM   1. Constipation    A CT scan showing large amount of stool. Today's CT scan shows no acute abnormalities. Still has large stool burden. He has no is no dilated renal cortex no hydroureteronephrosis no visible stone. Because of episodes again related to intestinal colic related to his constipation well-appearing his medications to a softener  as well as magnesium citrate primary care on Monday if not improved    Roney Marion, MD 04/06/13 1258

## 2013-04-06 NOTE — ED Notes (Signed)
The pt reports that he is very sleepy no pain

## 2013-04-06 NOTE — ED Notes (Signed)
Patient states RLQ pain x 3 wks.   Patient states it got worse last night.  Patient states "bended over double with pain".

## 2013-04-16 ENCOUNTER — Other Ambulatory Visit: Payer: Self-pay | Admitting: Orthopedic Surgery

## 2013-04-16 DIAGNOSIS — M549 Dorsalgia, unspecified: Secondary | ICD-10-CM

## 2013-04-27 ENCOUNTER — Ambulatory Visit
Admission: RE | Admit: 2013-04-27 | Discharge: 2013-04-27 | Disposition: A | Discharge status: Home / Self Care | Payer: Medicare Other | Source: Ambulatory Visit | Attending: Orthopedic Surgery | Admitting: Orthopedic Surgery

## 2013-04-27 DIAGNOSIS — M549 Dorsalgia, unspecified: Secondary | ICD-10-CM

## 2013-04-29 ENCOUNTER — Emergency Department (HOSPITAL_COMMUNITY)
Admission: EM | Admit: 2013-04-29 | Discharge: 2013-04-29 | Disposition: A | Discharge status: Home / Self Care | Payer: Medicare Other | Attending: Emergency Medicine | Admitting: Emergency Medicine

## 2013-04-29 ENCOUNTER — Emergency Department (HOSPITAL_COMMUNITY): Payer: Medicare Other

## 2013-04-29 ENCOUNTER — Encounter (HOSPITAL_COMMUNITY): Payer: Self-pay | Admitting: Emergency Medicine

## 2013-04-29 DIAGNOSIS — R300 Dysuria: Secondary | ICD-10-CM | POA: Insufficient documentation

## 2013-04-29 DIAGNOSIS — Z91040 Latex allergy status: Secondary | ICD-10-CM | POA: Insufficient documentation

## 2013-04-29 DIAGNOSIS — E119 Type 2 diabetes mellitus without complications: Secondary | ICD-10-CM | POA: Insufficient documentation

## 2013-04-29 DIAGNOSIS — I10 Essential (primary) hypertension: Secondary | ICD-10-CM | POA: Insufficient documentation

## 2013-04-29 DIAGNOSIS — G8929 Other chronic pain: Secondary | ICD-10-CM | POA: Insufficient documentation

## 2013-04-29 DIAGNOSIS — Z7982 Long term (current) use of aspirin: Secondary | ICD-10-CM | POA: Insufficient documentation

## 2013-04-29 DIAGNOSIS — Z860100 Personal history of colon polyps, unspecified: Secondary | ICD-10-CM | POA: Insufficient documentation

## 2013-04-29 DIAGNOSIS — M545 Low back pain, unspecified: Secondary | ICD-10-CM | POA: Insufficient documentation

## 2013-04-29 DIAGNOSIS — M549 Dorsalgia, unspecified: Secondary | ICD-10-CM

## 2013-04-29 DIAGNOSIS — Z880 Allergy status to penicillin: Secondary | ICD-10-CM | POA: Insufficient documentation

## 2013-04-29 DIAGNOSIS — R339 Retention of urine, unspecified: Secondary | ICD-10-CM | POA: Insufficient documentation

## 2013-04-29 DIAGNOSIS — F411 Generalized anxiety disorder: Secondary | ICD-10-CM | POA: Insufficient documentation

## 2013-04-29 DIAGNOSIS — F17200 Nicotine dependence, unspecified, uncomplicated: Secondary | ICD-10-CM | POA: Insufficient documentation

## 2013-04-29 DIAGNOSIS — M129 Arthropathy, unspecified: Secondary | ICD-10-CM | POA: Insufficient documentation

## 2013-04-29 DIAGNOSIS — E7800 Pure hypercholesterolemia, unspecified: Secondary | ICD-10-CM | POA: Insufficient documentation

## 2013-04-29 DIAGNOSIS — Z79899 Other long term (current) drug therapy: Secondary | ICD-10-CM | POA: Insufficient documentation

## 2013-04-29 DIAGNOSIS — K5900 Constipation, unspecified: Secondary | ICD-10-CM | POA: Insufficient documentation

## 2013-04-29 LAB — BASIC METABOLIC PANEL
BUN: 11 mg/dL (ref 6–23)
CO2: 31 mEq/L (ref 19–32)
Calcium: 9.5 mg/dL (ref 8.4–10.5)
Chloride: 103 mEq/L (ref 96–112)
Creatinine, Ser: 0.94 mg/dL (ref 0.50–1.35)
GFR calc Af Amer: >90 mL/min (ref >90)
GFR calc non Af Amer: 88 mL/min — ABNORMAL LOW (ref >90)
Glucose, Bld: 90 mg/dL (ref 70–99)
Potassium: 4.8 mEq/L (ref 3.5–5.1)
Sodium: 140 mEq/L (ref 135–145)

## 2013-04-29 LAB — URINE MICROSCOPIC-ADD ON

## 2013-04-29 LAB — URINALYSIS, ROUTINE W REFLEX MICROSCOPIC
Bilirubin Urine: NEGATIVE
Glucose, UA: NEGATIVE mg/dL
Ketones, ur: NEGATIVE mg/dL
Leukocytes, UA: NEGATIVE
Nitrite: NEGATIVE
Protein, ur: NEGATIVE mg/dL
Specific Gravity, Urine: <1.005 — ABNORMAL LOW (ref 1.005–1.030)
Urobilinogen, UA: 0.2 mg/dL (ref 0.0–1.0)
pH: 7 (ref 5.0–8.0)

## 2013-04-29 NOTE — ED Notes (Signed)
Leg bag placed. Teaching performed re: foley care and changing bags. Pt verbalized understanding with demonstration also. NAD.

## 2013-04-29 NOTE — ED Notes (Signed)
Pt denies any abd pain or tenderness.  Says doesn't feel like has to void or have BM at present time.

## 2013-04-29 NOTE — ED Provider Notes (Signed)
CSN: [id number]     Arrival date & time 04/29/13  0840 History   This chart was scribed for Raeford Razor, MD by Bennett Scrape, ED Scribe. This patient was seen in room APA01/APA01 and the patient's care was started at 1:19 PM.   Chief Complaint  Patient presents with  . Fecal Impaction    The history is provided by the patient. No language interpreter was used.    HPI Comments: Marc Rose is a 62 y.o. male who presents to the Emergency Department complaining of persistent constipation since Dec. 1, 2014. He states that he was seen at [facility] for the same and tried Miralax but started having diffuse pruritis. He also tried magnesium citrate, a warm bath, trying enemas and increasing the fiber in his diet with no improvement. He reports passing a small amount of stool this morning but has otherwise not passed any stool in several days. He states that he has associated lower back pain that radiates into his right groin and down his leg with pushing to have a BM. He reports that he had a MRI on Dec 26th for his back that showed 2 lower lumbar slipped discs and arthritic changes. He has an appointment for f/u next month to have a "cage put in my back". He denies any leg weakness or numbness, abdominal pain or changes in appetite. He reports having a colon resection performed on July 5th, 2014 to remove a neoplasm and denies any postsurgery complications.   Pt also states that he has been having issues with difficulty urinating described as trouble emptying the bladder. He was seen by PCP and told that he had blood noted in his urine. He has been f/u with urology (Dr. Loreta Ave) for the same and was told that he need to have his bladder stretched.   Past Medical History  Diagnosis Date  . Depression   . Impaired fasting glucose   . Hypertension   . Benign neoplasm of colon   . Unspecified retinal detachment   . Tobacco use disorder   . Anxiety state, unspecified   . Essential hypertension,  benign   . Elevated blood pressure reading without diagnosis of hypertension   . Causalgia of upper limb     Left limb  . Pure hypercholesterolemia   . Lower back pain   . Diabetes mellitus   . Arthritis   . Chronic leg pain    Past Surgical History  Procedure Laterality Date  . Ankle surgery      left  . Knee surgery      left  . Appendectomy    . Colonoscopy     Family History  Problem Relation Age of Onset  . COPD Mother   . Hyperthyroidism Mother   . Hyperthyroidism Sister    History  Substance Use Topics  . Smoking status: Current Every Day Smoker -- 0.50 packs/day    Types: Cigarettes  . Smokeless tobacco: Not on file  . Alcohol Use: No    Review of Systems  Constitutional: Negative for appetite change.  Gastrointestinal: Positive for constipation.  Genitourinary: Positive for difficulty urinating.  Musculoskeletal: Positive for back pain.  Neurological: Negative for weakness and numbness.  All other systems reviewed and are negative.    Allergies  Codeine; Penicillins; Latex; and Strawberry  Home Medications   Current Outpatient Rx  Name  Route  Sig  Dispense  Refill  . aspirin EC 81 MG tablet   Oral   Take  81 mg by mouth daily.         . benazepril (LOTENSIN) 40 MG tablet   Oral   Take 40 mg by mouth every evening.          . clobetasol cream (TEMOVATE) 0.05 %   Topical   Apply 1 application topically daily as needed (for skin irritation).         . cyclobenzaprine (FLEXERIL) 10 MG tablet   Oral   Take 10 mg by mouth 6 (six) times daily.          . diazepam (VALIUM) 10 MG tablet   Oral   Take 10 mg by mouth every 6 (six) hours as needed for anxiety.          . docusate sodium (COLACE) 100 MG capsule   Oral   Take 1 capsule (100 mg total) by mouth every 12 (twelve) hours.   60 capsule   0   . magnesium citrate SOLN   Oral   Take 296 mLs (1 Bottle total) by mouth once.   195 mL   3     Repeat daily until bowel  movement.   . naproxen sodium (ANAPROX) 220 MG tablet   Oral   Take 220 mg by mouth 2 (two) times daily with a meal.         . pravastatin (PRAVACHOL) 20 MG tablet   Oral   Take 20 mg by mouth every evening.         . pregabalin (LYRICA) 75 MG capsule   Oral   Take 75 mg by mouth 2 (two) times daily.          Triage vitals: BP 143/96  Pulse 107  Temp(Src) 98.4 F (36.9 C) (Oral)  Resp 18  Ht 6\' 2"  (1.88 m)  Wt 165 lb (74.844 kg)  BMI 21.18 kg/m2  SpO2 100%  Physical Exam  Nursing note and vitals reviewed. Constitutional: He appears well-developed and well-nourished. No distress.  HENT:  Head: Normocephalic and atraumatic.  Eyes: Conjunctivae are normal. Right eye exhibits no discharge. Left eye exhibits no discharge.  Neck: Neck supple.  Cardiovascular: Normal rate, regular rhythm and normal heart sounds.  Exam reveals no gallop and no friction rub.   No murmur heard. Pulmonary/Chest: Effort normal and breath sounds normal. No respiratory distress.  Abdominal: Soft. He exhibits no distension. There is no tenderness.  Genitourinary:  Bladder feels distended but is non-tender  Musculoskeletal: He exhibits no edema and no tenderness.  Neurological: He is alert.  5/5 lower extremity strength, sensation is intact to touch in "saddle" distribution. patellar reflexes 1+.   Skin: Skin is warm and dry.  Psychiatric: He has a normal mood and affect. His behavior is normal. Thought content normal.    ED Course  Procedures (including critical care time)  DIAGNOSTIC STUDIES: Oxygen Saturation is 100% on RA, normal by my interpretation.    COORDINATION OF CARE: 1:27 PM- Advised pt that his bladder and bowel symptoms could be caused by his back. Performed US exam at bedside. Pt voided 600 cc of urine but still c/o bladder fullness.   1:30 PM-Bedside US: Bladder visualized and appears distended.   1:33 PM-Advised pt to do enemas regularly. Will check blood work. Pt is  agreeable.  Labs Review Labs Reviewed - No data to display Imaging Review Dg Abd Acute W/chest  04/29/2013   CLINICAL DATA:  Back pain inferior urinary retention.  Constipation.  EXAM: ACUTE ABDOMEN SERIES (ABDOMEN  2 VIEW & CHEST 1 VIEW)  COMPARISON:  CT abdomen and pelvis 04/06/2013 Single view of the abdomen 04/06/2013. Single view of the chest 06/05/2009.  FINDINGS: The chest is hyperexpanded with bullous change in the apices. Prominent nipple shadows are noted. Lungs are clear. No pneumothorax or pleural effusion.  Two views of the abdomen show no free intraperitoneal air. The bowel gas pattern is normal. No focal bony abnormality is identified. No abnormal abdominal calcification is seen.  IMPRESSION: No acute finding.  Emphysema.   Electronically Signed   By: Drusilla Kanner M.D.   On: 04/29/2013 12:52    EKG Interpretation   None       MDM   1. Urinary retention   2. Back pain    This 62 year old male with urinary retention and constipation. I suspect that symptoms are neurogenic. 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 Patient was able to spontaneously void approximately 600 cc in the emergency room. He still had a distended bladder on my exam just after this. Bedside ultrasound was performed which showed over 900 cc of urine. Foley catheter was subsequently placed with return of over 1 liter. Patient will be discharged with catheter. He has an appointment with his neurosurgeon on December 31. Stressed importance of keeping this appointment and precautions to return to the emergency room discussed as well.  I personally preformed the services scribed in my presence. The recorded information has been reviewed is accurate. Raeford Razor, MD.    Raeford Razor, MD 04/29/13 [phone]

## 2013-04-29 NOTE — ED Notes (Deleted)
Pt c/o constipation "f

## 2013-04-29 NOTE — ED Notes (Signed)
Pt reports had xray on Dec 5th and was told he was impacted.  Reports was sent to [facility].  Reports was given IVF, filled a urinal full and was prescribed stool softener and mag citrate to go home with.   Reports did not have good results.   Pt says now is only voiding small amounts.  Reports had very small BM this morning.

## 2013-05-19 ENCOUNTER — Encounter (HOSPITAL_COMMUNITY): Payer: Self-pay | Admitting: Emergency Medicine

## 2013-05-19 ENCOUNTER — Emergency Department (HOSPITAL_COMMUNITY)
Admission: EM | Admit: 2013-05-19 | Discharge: 2013-05-19 | Disposition: A | Discharge status: Home / Self Care | Payer: Medicare Other | Attending: Emergency Medicine | Admitting: Emergency Medicine

## 2013-05-19 DIAGNOSIS — R1032 Left lower quadrant pain: Secondary | ICD-10-CM | POA: Insufficient documentation

## 2013-05-19 DIAGNOSIS — G8929 Other chronic pain: Secondary | ICD-10-CM | POA: Insufficient documentation

## 2013-05-19 DIAGNOSIS — Z8669 Personal history of other diseases of the nervous system and sense organs: Secondary | ICD-10-CM | POA: Insufficient documentation

## 2013-05-19 DIAGNOSIS — Z9889 Other specified postprocedural states: Secondary | ICD-10-CM | POA: Insufficient documentation

## 2013-05-19 DIAGNOSIS — E119 Type 2 diabetes mellitus without complications: Secondary | ICD-10-CM | POA: Insufficient documentation

## 2013-05-19 DIAGNOSIS — Z7982 Long term (current) use of aspirin: Secondary | ICD-10-CM | POA: Insufficient documentation

## 2013-05-19 DIAGNOSIS — Z880 Allergy status to penicillin: Secondary | ICD-10-CM | POA: Insufficient documentation

## 2013-05-19 DIAGNOSIS — F411 Generalized anxiety disorder: Secondary | ICD-10-CM | POA: Insufficient documentation

## 2013-05-19 DIAGNOSIS — Z79899 Other long term (current) drug therapy: Secondary | ICD-10-CM | POA: Insufficient documentation

## 2013-05-19 DIAGNOSIS — I10 Essential (primary) hypertension: Secondary | ICD-10-CM | POA: Insufficient documentation

## 2013-05-19 DIAGNOSIS — M129 Arthropathy, unspecified: Secondary | ICD-10-CM | POA: Insufficient documentation

## 2013-05-19 DIAGNOSIS — F3289 Other specified depressive episodes: Secondary | ICD-10-CM | POA: Insufficient documentation

## 2013-05-19 DIAGNOSIS — Z91040 Latex allergy status: Secondary | ICD-10-CM | POA: Insufficient documentation

## 2013-05-19 DIAGNOSIS — F329 Major depressive disorder, single episode, unspecified: Secondary | ICD-10-CM | POA: Insufficient documentation

## 2013-05-19 DIAGNOSIS — K5900 Constipation, unspecified: Secondary | ICD-10-CM | POA: Insufficient documentation

## 2013-05-19 DIAGNOSIS — Z791 Long term (current) use of non-steroidal anti-inflammatories (NSAID): Secondary | ICD-10-CM | POA: Insufficient documentation

## 2013-05-19 DIAGNOSIS — E7800 Pure hypercholesterolemia, unspecified: Secondary | ICD-10-CM | POA: Insufficient documentation

## 2013-05-19 DIAGNOSIS — F17200 Nicotine dependence, unspecified, uncomplicated: Secondary | ICD-10-CM | POA: Insufficient documentation

## 2013-05-19 LAB — URINALYSIS, ROUTINE W REFLEX MICROSCOPIC
Bilirubin Urine: NEGATIVE
Glucose, UA: NEGATIVE mg/dL
Hgb urine dipstick: NEGATIVE
Ketones, ur: NEGATIVE mg/dL
Leukocytes, UA: NEGATIVE
Nitrite: NEGATIVE
Protein, ur: NEGATIVE mg/dL
Specific Gravity, Urine: 1.015 (ref 1.005–1.030)
Urobilinogen, UA: 0.2 mg/dL (ref 0.0–1.0)
pH: 8 (ref 5.0–8.0)

## 2013-05-19 NOTE — ED Provider Notes (Signed)
CSN: [id number]     Arrival date & time 05/19/13  1400 History  This chart was scribed for Marc Manifold, MD by Jenne Campus, ED Scribe. This patient was seen in room APA16A/APA16A and the patient's care was started at 4:44 PM.   Chief Complaint  Patient presents with  . Abdominal Pain    The history is provided by the patient. No language interpreter was used.    HPI Comments: Marc Rose is a 63 y.o. male with a h/o recent back surgery which required an urinary catheter who presents to the Emergency Department complaining of left inguinal pain that radiates over the left hip and into the LLQ that started after having the urinary catheter removed 8 to 9 days ago. He describes the pain as a sharp intermittent pain with certain positions. During episodes of pain, pt reports that he changes position with resolution. He denies having any pain currently. Last episode occurred this morning. Upon further questioning, the pt also reports that he notices a bulge with standing in the same area. He reports that he called his surgeon's office and was advised to have an UA to rule out an UTI. He also reports that he has plans to f/u with his Urologist (Dr. Collene Mares) as well.  Since his last visit to the ED in December 2014, he states that his constipation and urinary retention problems since his surgery have improved. He is still drinking prune juice with subsequent BMs and has decreased the amount of pain medication that he takes. He states that he feels like he completely empties his bladder during urination. He denies any urine discoloration, malodorous urine or other urinary symptoms. Last BM was this morning. Last colonoscopy was 8 years ago. Plans to have another one soon. He is a current everyday smoker.   Dr. Tollie Pizza is PCP, has office visit in 4 days for 6 month check-up.    Past Medical History  Diagnosis Date  . Depression   . Impaired fasting glucose   . Hypertension   . Benign neoplasm  of colon   . Unspecified retinal detachment   . Tobacco use disorder   . Anxiety state, unspecified   . Essential hypertension, benign   . Elevated blood pressure reading without diagnosis of hypertension   . Causalgia of upper limb     Left limb  . Pure hypercholesterolemia   . Lower back pain   . Diabetes mellitus   . Arthritis   . Chronic leg pain    Past Surgical History  Procedure Laterality Date  . Ankle surgery      left  . Knee surgery      left  . Appendectomy    . Colonoscopy     Family History  Problem Relation Age of Onset  . COPD Mother   . Hyperthyroidism Mother   . Hyperthyroidism Sister    History  Substance Use Topics  . Smoking status: Current Every Day Smoker -- 0.50 packs/day    Types: Cigarettes  . Smokeless tobacco: Not on file  . Alcohol Use: No    Review of Systems  Gastrointestinal: Positive for constipation (ongoing, improving).  Genitourinary: Negative for dysuria, urgency, frequency and decreased urine volume.       + for left groin pain  All other systems reviewed and are negative.    Allergies  Codeine; Penicillins; Latex; and Strawberry  Home Medications   Current Outpatient Rx  Name  Route  Sig  Dispense  Refill  .  aspirin EC 81 MG tablet   Oral   Take 81 mg by mouth daily.         . benazepril (LOTENSIN) 40 MG tablet   Oral   Take 40 mg by mouth every evening.          . cyclobenzaprine (FLEXERIL) 10 MG tablet   Oral   Take 10 mg by mouth 4 (four) times daily.          . diazepam (VALIUM) 10 MG tablet   Oral   Take 10 mg by mouth every 6 (six) hours as needed for anxiety.          . docusate sodium (COLACE) 100 MG capsule   Oral   Take 200 mg by mouth 2 (two) times daily.         . naproxen sodium (ANAPROX) 220 MG tablet   Oral   Take 220 mg by mouth 2 (two) times daily with a meal.         . pravastatin (PRAVACHOL) 20 MG tablet   Oral   Take 20 mg by mouth every evening.         .  pregabalin (LYRICA) 75 MG capsule   Oral   Take 75 mg by mouth 2 (two) times daily.         . tamsulosin (FLOMAX) 0.4 MG CAPS capsule   Oral   Take 0.4 mg by mouth daily.         . traMADol (ULTRAM) 50 MG tablet   Oral   Take 50 mg by mouth every 6 (six) hours as needed. pain          Triage Vitals: BP 140/109  Pulse 89  Temp(Src) 98.8 F (37.1 C) (Oral)  Resp 18  Ht 6\' 2"  (1.88 m)  Wt 161 lb (73.029 kg)  BMI 20.66 kg/m2  SpO2 100%  Physical Exam  Nursing note and vitals reviewed. Constitutional: He is oriented to person, place, and time. He appears well-developed and well-nourished. No distress.  HENT:  Head: Normocephalic and atraumatic.  Eyes: Conjunctivae and EOM are normal.  Neck: Normal range of motion.  Cardiovascular: Normal rate, regular rhythm, normal heart sounds and intact distal pulses.   Pulmonary/Chest: Effort normal and breath sounds normal. No respiratory distress.  Abdominal: Soft. He exhibits no distension. There is no tenderness.  No CVA tenderness   Musculoskeletal: Normal range of motion.  Upper lumbar/lower thoracic midline incision that appears to be healing without complication   Neurological: He is alert and oriented to person, place, and time.  Skin: Skin is warm and dry.  Psychiatric: He has a normal mood and affect. Judgment normal.    ED Course  Procedures (including critical care time)  DIAGNOSTIC STUDIES: Oxygen Saturation is 100% on RA, normal by my interpretation.    COORDINATION OF CARE: 4:52 PM-Discussed treatment plan which includes UA with pt at bedside and pt agreed to plan. Advised pt that the UA may not show any good answer and that he will have to f/u with his Urologist. Pt verbalized his understanding.  Labs Review Labs Reviewed  URINALYSIS, ROUTINE W REFLEX MICROSCOPIC - Abnormal; Notable for the following:    APPearance HAZY (*)    All other components within normal limits   Imaging Review No results  found.  EKG Interpretation   None       MDM   1. Abdominal pain, LLQ (left lower quadrant)    63 year old male known to me  from a recent ER evaluation. He subsequently had surgery. His resolution urinary issues and constipation have improved. His abdominal exam is benign. His urinalysis is unremarkable. Symptoms have been ongoing for at least several weeks. I feel that further workup or imaging was of much utility are indicated. Return cautions were discussed. Outpatient followup otherwise.   I personally preformed the services scribed in my presence. The recorded information has been reviewed is accurate. Marc Manifold, MD.    Marc Manifold, MD 05/22/13 [phone]

## 2013-05-19 NOTE — Discharge Instructions (Signed)
Abdominal Pain, Adult °Many things can cause abdominal pain. Usually, abdominal pain is not caused by a disease and will improve without treatment. It can often be observed and treated at home. Your health care provider will do a physical exam and possibly order blood tests and X-rays to help determine the seriousness of your pain. However, in many cases, more time must pass before a clear cause of the pain can be found. Before that point, your health care provider may not know if you need more testing or further treatment. °HOME CARE INSTRUCTIONS  °Monitor your abdominal pain for any changes. The following actions may help to alleviate any discomfort you are experiencing: °· Only take over-the-counter or prescription medicines as directed by your health care provider. °· Do not take laxatives unless directed to do so by your health care provider. °· Try a clear liquid diet (broth, tea, or water) as directed by your health care provider. Slowly move to a bland diet as tolerated. °SEEK MEDICAL CARE IF: °· You have unexplained abdominal pain. °· You have abdominal pain associated with nausea or diarrhea. °· You have pain when you urinate or have a bowel movement. °· You experience abdominal pain that wakes you in the night. °· You have abdominal pain that is worsened or improved by eating food. °· You have abdominal pain that is worsened with eating fatty foods. °SEEK IMMEDIATE MEDICAL CARE IF:  °· Your pain does not go away within 2 hours. °· You have a fever. °· You keep throwing up (vomiting). °· Your pain is felt only in portions of the abdomen, such as the right side or the left lower portion of the abdomen. °· You pass bloody or black tarry stools. °MAKE SURE YOU: °· Understand these instructions.   °· Will watch your condition.   °· Will get help right away if you are not doing well or get worse.   °Document Released: 01/27/2005 Document Revised: 02/07/2013 Document Reviewed: 12/27/2012 °[employer]® Patient  Information ©2014 [employer]. ° °

## 2013-05-19 NOTE — ED Notes (Signed)
C/o LLQ pain onset this morning. Pt had recent back surgery which required urinary catheter; states has had this pain since having catheter removed the beginning of this month.

## 2013-06-05 ENCOUNTER — Encounter (HOSPITAL_COMMUNITY): Payer: Self-pay | Admitting: Emergency Medicine

## 2013-06-05 ENCOUNTER — Emergency Department (HOSPITAL_COMMUNITY)
Admission: EM | Admit: 2013-06-05 | Discharge: 2013-06-05 | Disposition: A | Discharge status: Home / Self Care | Payer: Medicare Other | Attending: Emergency Medicine | Admitting: Emergency Medicine

## 2013-06-05 DIAGNOSIS — Z791 Long term (current) use of non-steroidal anti-inflammatories (NSAID): Secondary | ICD-10-CM | POA: Insufficient documentation

## 2013-06-05 DIAGNOSIS — I10 Essential (primary) hypertension: Secondary | ICD-10-CM | POA: Insufficient documentation

## 2013-06-05 DIAGNOSIS — F17200 Nicotine dependence, unspecified, uncomplicated: Secondary | ICD-10-CM | POA: Insufficient documentation

## 2013-06-05 DIAGNOSIS — Z79899 Other long term (current) drug therapy: Secondary | ICD-10-CM | POA: Insufficient documentation

## 2013-06-05 DIAGNOSIS — G8929 Other chronic pain: Secondary | ICD-10-CM | POA: Insufficient documentation

## 2013-06-05 DIAGNOSIS — Z880 Allergy status to penicillin: Secondary | ICD-10-CM | POA: Insufficient documentation

## 2013-06-05 DIAGNOSIS — Z7982 Long term (current) use of aspirin: Secondary | ICD-10-CM | POA: Insufficient documentation

## 2013-06-05 DIAGNOSIS — R262 Difficulty in walking, not elsewhere classified: Secondary | ICD-10-CM | POA: Insufficient documentation

## 2013-06-05 DIAGNOSIS — E7800 Pure hypercholesterolemia, unspecified: Secondary | ICD-10-CM | POA: Insufficient documentation

## 2013-06-05 DIAGNOSIS — R339 Retention of urine, unspecified: Secondary | ICD-10-CM

## 2013-06-05 DIAGNOSIS — E119 Type 2 diabetes mellitus without complications: Secondary | ICD-10-CM | POA: Insufficient documentation

## 2013-06-05 DIAGNOSIS — F411 Generalized anxiety disorder: Secondary | ICD-10-CM | POA: Insufficient documentation

## 2013-06-05 DIAGNOSIS — Z9889 Other specified postprocedural states: Secondary | ICD-10-CM | POA: Insufficient documentation

## 2013-06-05 DIAGNOSIS — M129 Arthropathy, unspecified: Secondary | ICD-10-CM | POA: Insufficient documentation

## 2013-06-05 DIAGNOSIS — Z91040 Latex allergy status: Secondary | ICD-10-CM | POA: Insufficient documentation

## 2013-06-05 LAB — URINALYSIS, ROUTINE W REFLEX MICROSCOPIC
BILIRUBIN URINE: NEGATIVE
Glucose, UA: NEGATIVE mg/dL
Ketones, ur: NEGATIVE mg/dL
Nitrite: NEGATIVE
Protein, ur: NEGATIVE mg/dL
Specific Gravity, Urine: 1.01 (ref 1.005–1.030)
Urobilinogen, UA: 0.2 mg/dL (ref 0.0–1.0)
pH: 8 (ref 5.0–8.0)

## 2013-06-05 LAB — URINE MICROSCOPIC-ADD ON

## 2013-06-05 LAB — POCT I-STAT, CHEM 8
BUN: 13 mg/dL (ref 6–23)
CALCIUM ION: 1.15 mmol/L (ref 1.13–1.30)
CREATININE: 1.1 mg/dL (ref 0.50–1.35)
Chloride: 104 mEq/L (ref 96–112)
GLUCOSE: 85 mg/dL (ref 70–99)
HCT: 39 % (ref 39.0–52.0)
HEMOGLOBIN: 13.3 g/dL (ref 13.0–17.0)
POTASSIUM: 4.1 meq/L (ref 3.7–5.3)
Sodium: 142 mEq/L (ref 137–147)
TCO2: 27 mmol/L (ref 0–100)

## 2013-06-05 NOTE — ED Notes (Signed)
Patient had foley cath placed at Dr Marcelle Smiling yesterday, woke this morning with foley cath out. Denies any blood. Patient states "I even was able to pee a little." Denies any pain from foley coming out but reports feeling some retention.

## 2013-06-05 NOTE — ED Notes (Signed)
Catheter switched to leg bag at d/c. Pt given a standard drainage bag kit at d/c. Pt educated on how to switch from leg bag to drainage bag, and to wear drainage bag at night. Pt verbalized understanding. nad noted. 

## 2013-06-05 NOTE — ED Provider Notes (Signed)
CSN: [id number]     Arrival date & time 06/05/13  0730 History   First MD Initiated Contact with Patient 06/05/13 [phone]     Chief Complaint  Patient presents with  . Foley Cath replacement    (Consider location/radiation/quality/duration/timing/severity/associated sxs/prior Treatment) HPI Comments: He reports difficulty with urination and defecation ever since back surgery December 31. He had a cyst excised and he was told that he had nerve damage. He has had a foley catheter ever since. He went to see the urologist yesterday and the catheter was removed. He was unable to void so the catheter was replaced. Overnight the catheter fell out. The balloon was deflated. He denies any bleeding from his penis or penile pain. Denies abdominal pain. Denies any changes back pain. No weakness, numbness or tingling. No saddle anesthesia. No fevers or vomiting.  The history is provided by the patient.    Past Medical History  Diagnosis Date  . Depression   . Impaired fasting glucose   . Hypertension   . Benign neoplasm of colon   . Unspecified retinal detachment   . Tobacco use disorder   . Anxiety state, unspecified   . Essential hypertension, benign   . Elevated blood pressure reading without diagnosis of hypertension   . Causalgia of upper limb     Left limb  . Pure hypercholesterolemia   . Lower back pain   . Diabetes mellitus   . Arthritis   . Chronic leg pain    Past Surgical History  Procedure Laterality Date  . Ankle surgery      left  . Knee surgery      left  . Appendectomy    . Colonoscopy    . Back surgery     Family History  Problem Relation Age of Onset  . COPD Mother   . Hyperthyroidism Mother   . Hyperthyroidism Sister    History  Substance Use Topics  . Smoking status: Current Every Day Smoker -- 0.50 packs/day for 39 years    Types: Cigarettes  . Smokeless tobacco: Never Used  . Alcohol Use: No    Review of Systems  Constitutional: Negative for fever,  activity change and appetite change.  Respiratory: Negative for cough, chest tightness and shortness of breath.   Gastrointestinal: Negative for nausea, vomiting and abdominal pain.  Genitourinary: Positive for difficulty urinating. Negative for dysuria, urgency, hematuria, genital sores and testicular pain.  Musculoskeletal: Positive for back pain.  Skin: Negative for rash.  Neurological: Negative for dizziness, weakness and headaches.  A complete 10 system review of systems was obtained and all systems are negative except as noted in the HPI and PMH.    Allergies  Codeine; Penicillins; Latex; and Strawberry  Home Medications   Current Outpatient Rx  Name  Route  Sig  Dispense  Refill  . aspirin EC 81 MG tablet   Oral   Take 81 mg by mouth daily.         . benazepril (LOTENSIN) 40 MG tablet   Oral   Take 40 mg by mouth every evening.          . cyclobenzaprine (FLEXERIL) 10 MG tablet   Oral   Take 10 mg by mouth 4 (four) times daily.          . diazepam (VALIUM) 10 MG tablet   Oral   Take 10 mg by mouth every 6 (six) hours as needed for anxiety.          Marland Kitchen  docusate sodium (COLACE) 100 MG capsule   Oral   Take 200 mg by mouth 2 (two) times daily.         . naproxen sodium (ANAPROX) 220 MG tablet   Oral   Take 220 mg by mouth 2 (two) times daily with a meal.         . pravastatin (PRAVACHOL) 20 MG tablet   Oral   Take 20 mg by mouth every evening.         . pregabalin (LYRICA) 75 MG capsule   Oral   Take 75 mg by mouth 2 (two) times daily.         . tamsulosin (FLOMAX) 0.4 MG CAPS capsule   Oral   Take 0.4 mg by mouth daily.         . traMADol (ULTRAM) 50 MG tablet   Oral   Take 50 mg by mouth every 6 (six) hours as needed. pain          BP 142/100  Pulse 87  Temp(Src) 97.7 F (36.5 C) (Oral)  Resp 16  Ht 6\' 2"  (1.88 m)  Wt 162 lb (73.483 kg)  BMI 20.79 kg/m2  SpO2 98% Physical Exam  Constitutional: He is oriented to person,  place, and time. He appears well-developed and well-nourished. No distress.  HENT:  Head: Normocephalic and atraumatic.  Mouth/Throat: Oropharynx is clear and moist. No oropharyngeal exudate.  Eyes: Conjunctivae and EOM are normal. Pupils are equal, round, and reactive to light.  Neck: Normal range of motion. Neck supple.  Cardiovascular: Normal rate, regular rhythm and normal heart sounds.   Pulmonary/Chest: Effort normal and breath sounds normal. No respiratory distress.  Abdominal: Soft. There is no tenderness. There is no rebound and no guarding.  Genitourinary:  Normal rectal tone, no fecal impaction, no anesthesia No testicular pain  Musculoskeletal: Normal range of motion. He exhibits tenderness.  CN 2-12 intact, no ataxia on finger to nose, no nystagmus, 5/5 strength throughout, no pronator drift, Romberg negative, normal gait.  Healing lower thoracic upper lumbar incision without evidence of infection  Neurological: He is alert and oriented to person, place, and time. No cranial nerve deficit. He exhibits normal muscle tone. Coordination normal.  Skin: Skin is warm.    ED Course  Procedures (including critical care time) Labs Review Labs Reviewed  URINALYSIS, ROUTINE W REFLEX MICROSCOPIC - Abnormal; Notable for the following:    Hgb urine dipstick LARGE (*)    Leukocytes, UA TRACE (*)    All other components within normal limits  URINE MICROSCOPIC-ADD ON  POCT I-STAT, CHEM 8   Imaging Review No results found.  EKG Interpretation   None       MDM   1. Urinary retention    Hx of urinary retention after back surgery, Foley catheter came out overnight.  Denies new symptoms.  No weakness, numbness, tingling.  Equal strength in legs.  No saddle anesthesia.  Urinary retention is ongoing issue since back surgery.  Foley catheter replaced with greater than 600 cc of urine output. Negative for infection Creatinine is stable. Patient is appropriate for discharge as his  urinary retention has been ongoing issue since his surgery. He has no new weakness, numbness or tingling. Stable for follow up with urology.  Ezequiel Essex, MD 06/05/13 [phone]

## 2013-06-05 NOTE — Discharge Instructions (Signed)
Acute Urinary Retention, Male °Acute urinary retention is the temporary inability to urinate. °This is a common problem in older men. As men age their prostates become larger and block the flow of urine from the bladder. This is usually a problem that has come on gradually.  °HOME CARE INSTRUCTIONS °If you are sent home with a Foley catheter and a drainage system, you will need to discuss the best course of action with your health care provider. While the catheter is in, maintain a good intake of fluids. Keep the drainage bag emptied and lower than your catheter. This is so that contaminated urine will not flow back into your bladder, which could lead to a urinary tract infection. °There are two main types of drainage bags. One is a large bag that usually is used at night. It has a good capacity that will allow you to sleep through the night without having to empty it. The second type is called a leg bag. It has a smaller capacity, so it needs to be emptied more frequently. However, the main advantage is that it can be attached by a leg strap and can go underneath your clothing, allowing you the freedom to move about or leave your home. °Only take over-the-counter or prescription medicines for pain, discomfort, or fever as directed by your health care provider.  °SEEK MEDICAL CARE IF: °· You develop a low-grade fever. °· You experience spasms or leakage of urine with the spasms. °SEEK IMMEDIATE MEDICAL CARE IF:  °· You develop chills or fever. °· Your catheter stops draining urine. °· Your catheter falls out. °· You start to develop increased bleeding that does not respond to rest and increased fluid intake. °MAKE SURE YOU: °· Understand these instructions. °· Will watch your condition. °· Will get help right away if you are not doing well or get worse. °Document Released: 07/26/2000 Document Revised: 12/20/2012 Document Reviewed: 09/28/2012 °Exit[employer]® Patient Information ©2014 [employer]. ° °

## 2013-06-13 ENCOUNTER — Ambulatory Visit: Payer: Medicare Other | Admitting: Gastroenterology

## 2013-07-18 ENCOUNTER — Ambulatory Visit: Payer: Medicare Other | Admitting: Gastroenterology

## 2013-08-20 ENCOUNTER — Other Ambulatory Visit: Payer: Self-pay | Admitting: Orthopedic Surgery

## 2013-08-20 DIAGNOSIS — M545 Low back pain, unspecified: Secondary | ICD-10-CM

## 2013-08-22 ENCOUNTER — Ambulatory Visit
Admission: RE | Admit: 2013-08-22 | Discharge: 2013-08-22 | Disposition: A | Discharge status: Home / Self Care | Payer: Medicare Other | Source: Ambulatory Visit | Attending: Orthopedic Surgery | Admitting: Orthopedic Surgery

## 2013-08-22 DIAGNOSIS — M545 Low back pain, unspecified: Secondary | ICD-10-CM

## 2013-08-24 ENCOUNTER — Emergency Department (HOSPITAL_COMMUNITY): Payer: Medicare Other

## 2013-08-24 ENCOUNTER — Emergency Department (HOSPITAL_COMMUNITY)
Admission: EM | Admit: 2013-08-24 | Discharge: 2013-08-24 | Disposition: A | Discharge status: Home / Self Care | Payer: Medicare Other | Attending: Emergency Medicine | Admitting: Emergency Medicine

## 2013-08-24 ENCOUNTER — Encounter (HOSPITAL_COMMUNITY): Payer: Self-pay | Admitting: Emergency Medicine

## 2013-08-24 DIAGNOSIS — Z7982 Long term (current) use of aspirin: Secondary | ICD-10-CM | POA: Insufficient documentation

## 2013-08-24 DIAGNOSIS — F17200 Nicotine dependence, unspecified, uncomplicated: Secondary | ICD-10-CM | POA: Insufficient documentation

## 2013-08-24 DIAGNOSIS — Z79899 Other long term (current) drug therapy: Secondary | ICD-10-CM | POA: Insufficient documentation

## 2013-08-24 DIAGNOSIS — Z91040 Latex allergy status: Secondary | ICD-10-CM | POA: Insufficient documentation

## 2013-08-24 DIAGNOSIS — Z880 Allergy status to penicillin: Secondary | ICD-10-CM | POA: Insufficient documentation

## 2013-08-24 DIAGNOSIS — K5900 Constipation, unspecified: Secondary | ICD-10-CM

## 2013-08-24 DIAGNOSIS — M545 Low back pain, unspecified: Secondary | ICD-10-CM | POA: Insufficient documentation

## 2013-08-24 DIAGNOSIS — E119 Type 2 diabetes mellitus without complications: Secondary | ICD-10-CM | POA: Insufficient documentation

## 2013-08-24 DIAGNOSIS — Z9889 Other specified postprocedural states: Secondary | ICD-10-CM | POA: Insufficient documentation

## 2013-08-24 DIAGNOSIS — IMO0002 Reserved for concepts with insufficient information to code with codable children: Secondary | ICD-10-CM | POA: Insufficient documentation

## 2013-08-24 DIAGNOSIS — Z791 Long term (current) use of non-steroidal anti-inflammatories (NSAID): Secondary | ICD-10-CM | POA: Insufficient documentation

## 2013-08-24 DIAGNOSIS — G8929 Other chronic pain: Secondary | ICD-10-CM | POA: Insufficient documentation

## 2013-08-24 DIAGNOSIS — M129 Arthropathy, unspecified: Secondary | ICD-10-CM | POA: Insufficient documentation

## 2013-08-24 DIAGNOSIS — Z9089 Acquired absence of other organs: Secondary | ICD-10-CM | POA: Insufficient documentation

## 2013-08-24 DIAGNOSIS — E7800 Pure hypercholesterolemia, unspecified: Secondary | ICD-10-CM | POA: Insufficient documentation

## 2013-08-24 DIAGNOSIS — Z8669 Personal history of other diseases of the nervous system and sense organs: Secondary | ICD-10-CM | POA: Insufficient documentation

## 2013-08-24 DIAGNOSIS — F411 Generalized anxiety disorder: Secondary | ICD-10-CM | POA: Insufficient documentation

## 2013-08-24 LAB — COMPREHENSIVE METABOLIC PANEL
ALT: 10 U/L (ref 0–53)
AST: 14 U/L (ref 0–37)
Albumin: 3.1 g/dL — ABNORMAL LOW (ref 3.5–5.2)
Alkaline Phosphatase: 77 U/L (ref 39–117)
BUN: 15 mg/dL (ref 6–23)
CALCIUM: 9.1 mg/dL (ref 8.4–10.5)
CO2: 30 mEq/L (ref 19–32)
CREATININE: 0.95 mg/dL (ref 0.50–1.35)
Chloride: 100 mEq/L (ref 96–112)
GFR calc Af Amer: >90 mL/min (ref >90)
GFR, EST NON AFRICAN AMERICAN: 87 mL/min — AB (ref >90)
Glucose, Bld: 133 mg/dL — ABNORMAL HIGH (ref 70–99)
Potassium: 4.2 mEq/L (ref 3.7–5.3)
Sodium: 140 mEq/L (ref 137–147)
Total Bilirubin: 0.2 mg/dL — ABNORMAL LOW (ref 0.3–1.2)
Total Protein: 7.1 g/dL (ref 6.0–8.3)

## 2013-08-24 LAB — CBC WITH DIFFERENTIAL/PLATELET
Basophils Absolute: 0 10*3/uL (ref 0.0–0.1)
Basophils Relative: 0 % (ref 0–1)
EOS PCT: 2 % (ref 0–5)
Eosinophils Absolute: 0.2 10*3/uL (ref 0.0–0.7)
HEMATOCRIT: 37.7 % — AB (ref 39.0–52.0)
HEMOGLOBIN: 12.5 g/dL — AB (ref 13.0–17.0)
LYMPHS PCT: 16 % (ref 12–46)
Lymphs Abs: 1.6 10*3/uL (ref 0.7–4.0)
MCH: 31.7 pg (ref 26.0–34.0)
MCHC: 33.2 g/dL (ref 30.0–36.0)
MCV: 95.7 fL (ref 78.0–100.0)
MONO ABS: 1.1 10*3/uL — AB (ref 0.1–1.0)
MONOS PCT: 11 % (ref 3–12)
Neutro Abs: 6.9 10*3/uL (ref 1.7–7.7)
Neutrophils Relative %: 70 % (ref 43–77)
Platelets: 373 10*3/uL (ref 150–400)
RBC: 3.94 MIL/uL — ABNORMAL LOW (ref 4.22–5.81)
RDW: 14.2 % (ref 11.5–15.5)
WBC: 9.8 10*3/uL (ref 4.0–10.5)

## 2013-08-24 MED ORDER — MINERAL OIL RE ENEM: ENEMA | RECTAL | Status: DC

## 2013-08-24 MED ORDER — DOCUSATE SODIUM 250 MG PO CAPS: 250.0000 mg | ORAL_CAPSULE | Freq: Every day | ORAL | Status: DC

## 2013-08-24 MED ORDER — IOHEXOL 300 MG/ML  SOLN
50.0000 mL | Freq: Once | INTRAMUSCULAR | Status: AC | PRN
  Administered 2013-08-24: 50 mL via ORAL

## 2013-08-24 MED ORDER — POLYETHYLENE GLYCOL 3350 17 G PO PACK: 17.0000 g | PACK | Freq: Every day | ORAL | Status: DC

## 2013-08-24 MED ORDER — IOHEXOL 300 MG/ML  SOLN
100.0000 mL | Freq: Once | INTRAMUSCULAR | Status: AC | PRN
  Administered 2013-08-24: 100 mL via INTRAVENOUS

## 2013-08-24 NOTE — ED Notes (Signed)
Patient c/o right lower back pain since having back surgery in December. Patient also reports not constipation with no BM since August 04, 2013. Per patient eating well with no nausea or vomiting.

## 2013-08-24 NOTE — ED Provider Notes (Signed)
CSN: [id number]     Arrival date & time 08/24/13  0907 History   First MD Initiated Contact with Patient 08/24/13 [phone]     Chief Complaint  Patient presents with  . Constipation  . Back Pain     (Consider location/radiation/quality/duration/timing/severity/associated sxs/prior Treatment) Patient is a 63 y.o. male presenting with constipation and back pain. The history is provided by the patient. No language interpreter was used.  Constipation Severity:  Moderate Time since last bowel movement:  3 weeks Timing:  Constant Progression:  Worsening Chronicity:  New Context: not dehydration   Stool description:  None produced Relieved by:  Nothing Worsened by:  Nothing tried Ineffective treatments:  None tried Associated symptoms: back pain   Risk factors: change in medication, recent illness and recent surgery   Back Pain Pt reports he has been on pain medication since his back surgery.   Pt complains of constipation.   Pt reports his MD had him take mineral oil, mom and another laxative yesterday.   Pt reports no results.    Past Medical History  Diagnosis Date  . Depression   . Impaired fasting glucose   . Hypertension   . Benign neoplasm of colon   . Unspecified retinal detachment   . Tobacco use disorder   . Anxiety state, unspecified   . Essential hypertension, benign   . Elevated blood pressure reading without diagnosis of hypertension   . Causalgia of upper limb     Left limb  . Pure hypercholesterolemia   . Lower back pain   . Diabetes mellitus   . Arthritis   . Chronic leg pain    Past Surgical History  Procedure Laterality Date  . Ankle surgery      left  . Knee surgery      left  . Appendectomy    . Colonoscopy    . Back surgery     Family History  Problem Relation Age of Onset  . COPD Mother   . Hyperthyroidism Mother   . Hyperthyroidism Sister    History  Substance Use Topics  . Smoking status: Current Every Day Smoker -- 0.50 packs/day for 39  years    Types: Cigarettes  . Smokeless tobacco: Never Used  . Alcohol Use: No    Review of Systems  Gastrointestinal: Positive for constipation.  Musculoskeletal: Positive for back pain.  All other systems reviewed and are negative.     Allergies  Codeine; Penicillins; Latex; and Strawberry  Home Medications   Prior to Admission medications   Medication Sig Start Date End Date Taking? Authorizing Provider  aspirin EC 81 MG tablet Take 81 mg by mouth daily.   Yes Historical Provider, MD  benazepril (LOTENSIN) 40 MG tablet Take 40 mg by mouth every evening.    Yes Historical Provider, MD  cyclobenzaprine (FLEXERIL) 10 MG tablet Take 10 mg by mouth 4 (four) times daily.    Yes Historical Provider, MD  diazepam (VALIUM) 10 MG tablet Take 10 mg by mouth every 6 (six) hours as needed for anxiety.    Yes Historical Provider, MD  lactulose (CHRONULAC) 10 GM/15ML solution Take 30 mLs by mouth once. 08/23/13  Yes Historical Provider, MD  naproxen sodium (ANAPROX) 220 MG tablet Take 220 mg by mouth 2 (two) times daily with a meal.   Yes Historical Provider, MD  pravastatin (PRAVACHOL) 20 MG tablet Take 20 mg by mouth every evening.   Yes Historical Provider, MD  predniSONE (STERAPRED UNI-PAK) 5 MG  TABS tablet Take 5 mg by mouth daily. 08/17/13  Yes Historical Provider, MD  pregabalin (LYRICA) 75 MG capsule Take 75 mg by mouth 3 (three) times daily.    Yes Historical Provider, MD  sulfamethoxazole-trimethoprim (BACTRIM DS) 800-160 MG per tablet Take 1 tablet by mouth daily. 08/23/13  Yes Historical Provider, MD   BP 110/85  Pulse 92  Temp(Src) 98.2 F (36.8 C) (Oral)  Resp 16  Ht 6\' 2"  (1.88 m)  Wt 155 lb (70.308 kg)  BMI 19.89 kg/m2  SpO2 100% Physical Exam  Nursing note and vitals reviewed. Constitutional: He is oriented to person, place, and time. He appears well-developed and well-nourished.  HENT:  Head: Normocephalic and atraumatic.  Right Ear: External ear normal.  Left Ear:  External ear normal.  Nose: Nose normal.  Mouth/Throat: Oropharynx is clear and moist.  Eyes: Conjunctivae and EOM are normal. Pupils are equal, round, and reactive to light.  Neck: Normal range of motion.  Cardiovascular: Normal rate and normal heart sounds.   Pulmonary/Chest: Effort normal and breath sounds normal.  Abdominal: Soft. He exhibits no distension.  Musculoskeletal: Normal range of motion.  Neurological: He is alert and oriented to person, place, and time.  Skin: Skin is warm.  Psychiatric: He has a normal mood and affect.    ED Course  Procedures (including critical care time) Labs Review Labs Reviewed  CBC WITH DIFFERENTIAL - Abnormal; Notable for the following:    RBC 3.94 (*)    Hemoglobin 12.5 (*)    HCT 37.7 (*)    Monocytes Absolute 1.1 (*)    All other components within normal limits  COMPREHENSIVE METABOLIC PANEL - Abnormal; Notable for the following:    Glucose, Bld 133 (*)    Albumin 3.1 (*)    Total Bilirubin 0.2 (*)    GFR calc non Af Amer 87 (*)    All other components within normal limits    Imaging Review Ct Abdomen Pelvis W Contrast  08/24/2013   CLINICAL DATA:  Right side abdominal pain, change in bowel habit  EXAM: CT ABDOMEN AND PELVIS WITH CONTRAST  TECHNIQUE: Multidetector CT imaging of the abdomen and pelvis was performed using the standard protocol following bolus administration of intravenous contrast.  CONTRAST:  35mL OMNIPAQUE IOHEXOL 300 MG/ML SOLN, 128mL OMNIPAQUE IOHEXOL 300 MG/ML SOLN  COMPARISON:  04/06/2013  FINDINGS: Sagittal images of the spine shows significant disc space flattening with endplate sclerotic changes vacuum disc phenomenon anterior and posterior spurring at L5-S1 level. Mild disc space flattening with mild posterior disc bulge at L4-L5 level. Posterior metallic fixation material noted L1-L2 level.  Lung bases are unremarkable. There is a hiatal hernia measures 3 cm.  Enhanced liver shows no biliary ductal dilatation.  Tiny hepatic cysts are noted. No calcified gallstones are noted within gallbladder. CBD measures 1 cm in diameter.  No aortic aneurysm. The pancreas, spleen and adrenal glands are unremarkable. Significant colonic distention especially right colon and transverse colon with abundant stool. There is some focal narrowing of the colonic lumen in axial image 37. There is some similar narrowing of colonic lumen in right colon axial image 60. Findings may be due to multifocal colonic spasm. Follow-up colonoscopy is recommended to exclude a neoplastic process. No definite colonic mass is identified.  Moderate stool noted in sigmoid colon and descending colon.  No small bowel obstruction. No ascites or free air. No adenopathy. Kidneys are symmetrical in size and enhancement. No hydronephrosis or hydroureter. There is a cyst  in midpole of the right kidney measures 8 mm. Delayed renal images shows bilateral renal symmetrical excretion. Bilateral visualized proximal ureter is unremarkable.  A distended urinary bladder is noted. Small right inguinal carinal hernia containing fat without evidence of acute complication.  IMPRESSION: 1. There is moderate distension of the right colon with abundant stool. Significant distension transverse colon with stool. There are at least 2 focal areas of narrowing of the lumen suspicious for colonic spasm rather than lesion. Follow-up nonemergent colonoscopy is recommended. 2. No small bowel obstruction. 3. No hydronephrosis or hydroureter. 4. Degenerative changes lumbar spine. 5. Mild CBD dilatation up to 1 cm.   Electronically Signed   By: Lahoma Crocker M.D.   On: 08/24/2013 11:58     EKG Interpretation None      MDM   Final diagnoses:  Constipation    Pt counseled,  I advised him to follow up with his GI doctor.   Pt advised of need of followup colonoscopy.   Pt given rx for miralax, enema's and colace.      [place], PA-C 08/24/13 1352

## 2013-08-24 NOTE — Discharge Instructions (Signed)
Constipation, Adult Constipation is when a person has fewer than 3 bowel movements a week; has difficulty having a bowel movement; or has stools that are dry, hard, or larger than normal. As people grow older, constipation is more common. If you try to fix constipation with medicines that make you have a bowel movement (laxatives), the problem may get worse. Long-term laxative use may cause the muscles of the colon to become weak. A low-fiber diet, not taking in enough fluids, and taking certain medicines may make constipation worse. CAUSES   Certain medicines, such as antidepressants, pain medicine, iron supplements, antacids, and water pills.   Certain diseases, such as diabetes, irritable bowel syndrome (IBS), thyroid disease, or depression.   Not drinking enough water.   Not eating enough fiber-rich foods.   Stress or travel.  Lack of physical activity or exercise.  Not going to the restroom when there is the urge to have a bowel movement.  Ignoring the urge to have a bowel movement.  Using laxatives too much. SYMPTOMS   Having fewer than 3 bowel movements a week.   Straining to have a bowel movement.   Having hard, dry, or larger than normal stools.   Feeling full or bloated.   Pain in the lower abdomen.  Not feeling relief after having a bowel movement. DIAGNOSIS  Your caregiver will take a medical history and perform a physical exam. Further testing may be done for severe constipation. Some tests may include:   A barium enema X-ray to examine your rectum, colon, and sometimes, your small intestine.  A sigmoidoscopy to examine your lower colon.  A colonoscopy to examine your entire colon. TREATMENT  Treatment will depend on the severity of your constipation and what is causing it. Some dietary treatments include drinking more fluids and eating more fiber-rich foods. Lifestyle treatments may include regular exercise. If these diet and lifestyle recommendations  do not help, your caregiver may recommend taking over-the-counter laxative medicines to help you have bowel movements. Prescription medicines may be prescribed if over-the-counter medicines do not work.  HOME CARE INSTRUCTIONS   Increase dietary fiber in your diet, such as fruits, vegetables, whole grains, and beans. Limit high-fat and processed sugars in your diet, such as [place] fries, hamburgers, cookies, candies, and soda.   A fiber supplement may be added to your diet if you cannot get enough fiber from foods.   Drink enough fluids to keep your urine clear or pale yellow.   Exercise regularly or as directed by your caregiver.   Go to the restroom when you have the urge to go. Do not hold it.  Only take medicines as directed by your caregiver. Do not take other medicines for constipation without talking to your caregiver first. [place] IF:   You have bright red blood in your stool.   Your constipation lasts for more than 4 days or gets worse.   You have abdominal or rectal pain.   You have thin, pencil-like stools.  You have unexplained weight loss. MAKE SURE YOU:   Understand these instructions.  Will watch your condition.  Will get help right away if you are not doing well or get worse. Document Released: 01/16/2004 Document Revised: 07/12/2011 Document Reviewed: 01/29/2013 [facility] Patient Information 2014 [place], [place].  Fiber Content in Foods Drinking plenty of fluids and consuming foods high in fiber can help with constipation. See the list below for the fiber content of some common foods. Starches and Grains / Dietary  Fiber (g)  Cheerios, 1 cup / 3 g  Kellogg's Corn Flakes, 1 cup / 0.7 g  Rice Krispies, 1  cup / 0.3 g  Quaker Oat Life Cereal,  cup / 2.1 g  Oatmeal, instant (cooked),  cup / 2 g  Kellogg's Frosted Mini Wheats, 1 cup / 5.1 g  Rice, brown, long-grain (cooked), 1 cup / 3.5 g  Rice, white, long-grain (cooked),  1 cup / 0.6 g  Macaroni, cooked, enriched, 1 cup / 2.5 g Legumes / Dietary Fiber (g)  Beans, baked, canned, plain or vegetarian,  cup / 5.2 g  Beans, kidney, canned,  cup / 6.8 g  Beans, pinto, dried (cooked),  cup / 7.7 g  Beans, pinto, canned,  cup / 5.5 g Breads and Crackers / Dietary Fiber (g)  Graham crackers, plain or honey, 2 squares / 0.7 g  Saltine crackers, 3 squares / 0.3 g  Pretzels, plain, salted, 10 pieces / 1.8 g  Bread, whole-wheat, 1 slice / 1.9 g  Bread, white, 1 slice / 0.7 g  Bread, raisin, 1 slice / 1.2 g  Bagel, plain, 3 oz / 2 g  Tortilla, flour, 1 oz / 0.9 g  Tortilla, corn, 1 small / 1.5 g  Bun, hamburger or hotdog, 1 small / 0.9 g Fruits / Dietary Fiber (g)  Apple, raw with skin, 1 medium / 4.4 g  Applesauce, sweetened,  cup / 1.5 g  Banana,  medium / 1.5 g  Grapes, 10 grapes / 0.4 g  Orange, 1 small / 2.3 g  Raisin, 1.5 oz / 1.6 g  Melon, 1 cup / 1.4 g Vegetables / Dietary Fiber (g)  Green beans, canned,  cup / 1.3 g  Carrots (cooked),  cup / 2.3 g  Broccoli (cooked),  cup / 2.8 g  Peas, frozen (cooked),  cup / 4.4 g  Potatoes, mashed,  cup / 1.6 g  Lettuce, 1 cup / 0.5 g  Corn, canned,  cup / 1.6 g  Tomato,  cup / 1.1 g Document Released: 09/05/2006 Document Revised: 07/12/2011 Document Reviewed: 10/31/2006 [employer] Patient Information 2014 [place], [place].

## 2013-08-26 NOTE — ED Provider Notes (Signed)
Medical screening examination/treatment/procedure(s) were performed by non-physician practitioner and as supervising physician I was immediately available for consultation/collaboration.   EKG Interpretation None        Alfonzo Feller, DO 08/26/13 1248

## 2013-09-16 ENCOUNTER — Encounter (HOSPITAL_COMMUNITY): Payer: Self-pay | Admitting: Emergency Medicine

## 2013-09-16 ENCOUNTER — Emergency Department (HOSPITAL_COMMUNITY): Payer: Medicare Other

## 2013-09-16 ENCOUNTER — Inpatient Hospital Stay (HOSPITAL_COMMUNITY)
Admission: EM | Admit: 2013-09-16 | Discharge: 2013-09-19 | DRG: 640 | Disposition: A | Discharge status: Other Acute Inpatient Hospital | Payer: Medicare Other | Attending: Internal Medicine | Admitting: Internal Medicine

## 2013-09-16 DIAGNOSIS — R32 Unspecified urinary incontinence: Secondary | ICD-10-CM | POA: Diagnosis present

## 2013-09-16 DIAGNOSIS — F17200 Nicotine dependence, unspecified, uncomplicated: Secondary | ICD-10-CM | POA: Diagnosis present

## 2013-09-16 DIAGNOSIS — E871 Hypo-osmolality and hyponatremia: Principal | ICD-10-CM | POA: Diagnosis present

## 2013-09-16 DIAGNOSIS — K5900 Constipation, unspecified: Secondary | ICD-10-CM | POA: Diagnosis present

## 2013-09-16 DIAGNOSIS — S32009A Unspecified fracture of unspecified lumbar vertebra, initial encounter for closed fracture: Secondary | ICD-10-CM

## 2013-09-16 DIAGNOSIS — E1169 Type 2 diabetes mellitus with other specified complication: Secondary | ICD-10-CM | POA: Diagnosis present

## 2013-09-16 DIAGNOSIS — K6812 Psoas muscle abscess: Secondary | ICD-10-CM | POA: Diagnosis present

## 2013-09-16 DIAGNOSIS — M869 Osteomyelitis, unspecified: Secondary | ICD-10-CM | POA: Diagnosis present

## 2013-09-16 DIAGNOSIS — E7800 Pure hypercholesterolemia, unspecified: Secondary | ICD-10-CM | POA: Diagnosis present

## 2013-09-16 DIAGNOSIS — I723 Aneurysm of iliac artery: Secondary | ICD-10-CM

## 2013-09-16 DIAGNOSIS — I10 Essential (primary) hypertension: Secondary | ICD-10-CM | POA: Diagnosis present

## 2013-09-16 DIAGNOSIS — Z79899 Other long term (current) drug therapy: Secondary | ICD-10-CM

## 2013-09-16 DIAGNOSIS — E119 Type 2 diabetes mellitus without complications: Secondary | ICD-10-CM

## 2013-09-16 DIAGNOSIS — M9080 Osteopathy in diseases classified elsewhere, unspecified site: Secondary | ICD-10-CM | POA: Diagnosis present

## 2013-09-16 DIAGNOSIS — M4640 Discitis, unspecified, site unspecified: Secondary | ICD-10-CM

## 2013-09-16 DIAGNOSIS — S32000A Wedge compression fracture of unspecified lumbar vertebra, initial encounter for closed fracture: Secondary | ICD-10-CM

## 2013-09-16 DIAGNOSIS — M549 Dorsalgia, unspecified: Secondary | ICD-10-CM | POA: Diagnosis present

## 2013-09-16 DIAGNOSIS — F411 Generalized anxiety disorder: Secondary | ICD-10-CM | POA: Diagnosis present

## 2013-09-16 DIAGNOSIS — I959 Hypotension, unspecified: Secondary | ICD-10-CM | POA: Diagnosis present

## 2013-09-16 DIAGNOSIS — M129 Arthropathy, unspecified: Secondary | ICD-10-CM | POA: Diagnosis present

## 2013-09-16 DIAGNOSIS — F419 Anxiety disorder, unspecified: Secondary | ICD-10-CM

## 2013-09-16 DIAGNOSIS — G8929 Other chronic pain: Secondary | ICD-10-CM | POA: Diagnosis present

## 2013-09-16 DIAGNOSIS — Z7982 Long term (current) use of aspirin: Secondary | ICD-10-CM

## 2013-09-16 HISTORY — DX: Dorsalgia, unspecified: M54.9

## 2013-09-16 HISTORY — DX: Other chronic pain: G89.29

## 2013-09-16 LAB — URINALYSIS, ROUTINE W REFLEX MICROSCOPIC
BILIRUBIN URINE: NEGATIVE
Glucose, UA: 100 mg/dL — AB
Leukocytes, UA: NEGATIVE
NITRITE: NEGATIVE
PH: 6 (ref 5.0–8.0)
Specific Gravity, Urine: 1.025 (ref 1.005–1.030)
Urobilinogen, UA: 1 mg/dL (ref 0.0–1.0)

## 2013-09-16 LAB — COMPREHENSIVE METABOLIC PANEL
ALT: 21 U/L (ref 0–53)
AST: 21 U/L (ref 0–37)
Albumin: 2.9 g/dL — ABNORMAL LOW (ref 3.5–5.2)
Alkaline Phosphatase: 143 U/L — ABNORMAL HIGH (ref 39–117)
BUN: 23 mg/dL (ref 6–23)
CALCIUM: 9.7 mg/dL (ref 8.4–10.5)
CO2: 26 mEq/L (ref 19–32)
CREATININE: 0.9 mg/dL (ref 0.50–1.35)
Chloride: 93 mEq/L — ABNORMAL LOW (ref 96–112)
GFR calc Af Amer: >90 mL/min (ref >90)
GFR calc non Af Amer: 89 mL/min — ABNORMAL LOW (ref >90)
Glucose, Bld: 120 mg/dL — ABNORMAL HIGH (ref 70–99)
Potassium: 4.7 mEq/L (ref 3.7–5.3)
SODIUM: 132 meq/L — AB (ref 137–147)
TOTAL PROTEIN: 7.9 g/dL (ref 6.0–8.3)
Total Bilirubin: 0.4 mg/dL (ref 0.3–1.2)

## 2013-09-16 LAB — CBC WITH DIFFERENTIAL/PLATELET
Basophils Absolute: 0 10*3/uL (ref 0.0–0.1)
Basophils Relative: 0 % (ref 0–1)
EOS ABS: 0 10*3/uL (ref 0.0–0.7)
Eosinophils Relative: 0 % (ref 0–5)
HEMATOCRIT: 31.9 % — AB (ref 39.0–52.0)
HEMOGLOBIN: 11.1 g/dL — AB (ref 13.0–17.0)
LYMPHS ABS: 1 10*3/uL (ref 0.7–4.0)
Lymphocytes Relative: 8 % — ABNORMAL LOW (ref 12–46)
MCH: 31.4 pg (ref 26.0–34.0)
MCHC: 34.8 g/dL (ref 30.0–36.0)
MCV: 90.4 fL (ref 78.0–100.0)
MONO ABS: 1.5 10*3/uL — AB (ref 0.1–1.0)
MONOS PCT: 12 % (ref 3–12)
NEUTROS PCT: 80 % — AB (ref 43–77)
Neutro Abs: 9.8 10*3/uL — ABNORMAL HIGH (ref 1.7–7.7)
Platelets: 591 10*3/uL — ABNORMAL HIGH (ref 150–400)
RBC: 3.53 MIL/uL — AB (ref 4.22–5.81)
RDW: 14 % (ref 11.5–15.5)
WBC: 12.2 10*3/uL — ABNORMAL HIGH (ref 4.0–10.5)

## 2013-09-16 LAB — URINE MICROSCOPIC-ADD ON

## 2013-09-16 LAB — LIPASE, BLOOD: Lipase: 11 U/L (ref 11–59)

## 2013-09-16 LAB — TROPONIN I: Troponin I: <0.3 ng/mL (ref <0.30)

## 2013-09-16 MED ORDER — DOCUSATE SODIUM 100 MG PO CAPS
100.0000 mg | ORAL_CAPSULE | Freq: Two times a day (BID) | ORAL | Status: DC
  Administered 2013-09-17 – 2013-09-19 (×6): 100 mg via ORAL
  Filled 2013-09-16 (×10): qty 1

## 2013-09-16 MED ORDER — CYCLOBENZAPRINE HCL 10 MG PO TABS: 10.0000 mg | ORAL_TABLET | Freq: Four times a day (QID) | ORAL | Status: DC

## 2013-09-16 MED ORDER — IOHEXOL 300 MG/ML  SOLN
50.0000 mL | Freq: Once | INTRAMUSCULAR | Status: AC | PRN
  Administered 2013-09-16: 50 mL via ORAL

## 2013-09-16 MED ORDER — SODIUM CHLORIDE 0.9 % IV SOLN
INTRAVENOUS | Status: DC
  Administered 2013-09-16 – 2013-09-18 (×6): via INTRAVENOUS

## 2013-09-16 MED ORDER — SODIUM CHLORIDE 0.9 % IV BOLUS (SEPSIS)
500.0000 mL | Freq: Once | INTRAVENOUS | Status: AC
  Administered 2013-09-16: 500 mL via INTRAVENOUS

## 2013-09-16 MED ORDER — HEPARIN SODIUM (PORCINE) 5000 UNIT/ML IJ SOLN
5000.0000 [IU] | Freq: Three times a day (TID) | INTRAMUSCULAR | Status: DC
  Administered 2013-09-17 – 2013-09-19 (×8): 5000 [IU] via SUBCUTANEOUS
  Filled 2013-09-16 (×8): qty 1

## 2013-09-16 MED ORDER — SIMVASTATIN 10 MG PO TABS
10.0000 mg | ORAL_TABLET | Freq: Every day | ORAL | Status: DC
  Administered 2013-09-17 – 2013-09-18 (×3): 10 mg via ORAL
  Filled 2013-09-16 (×5): qty 1

## 2013-09-16 MED ORDER — IOHEXOL 300 MG/ML  SOLN
100.0000 mL | Freq: Once | INTRAMUSCULAR | Status: AC | PRN
  Administered 2013-09-16: 100 mL via INTRAVENOUS

## 2013-09-16 MED ORDER — ASPIRIN EC 81 MG PO TBEC
81.0000 mg | DELAYED_RELEASE_TABLET | Freq: Every day | ORAL | Status: DC
  Administered 2013-09-17 – 2013-09-19 (×4): 81 mg via ORAL
  Filled 2013-09-16 (×7): qty 1

## 2013-09-16 MED ORDER — ONDANSETRON HCL 4 MG/2ML IJ SOLN: 4.0000 mg | Freq: Four times a day (QID) | INTRAMUSCULAR | Status: DC | PRN

## 2013-09-16 MED ORDER — ONDANSETRON HCL 4 MG PO TABS: 4.0000 mg | ORAL_TABLET | Freq: Four times a day (QID) | ORAL | Status: DC | PRN

## 2013-09-16 MED ORDER — PREGABALIN 75 MG PO CAPS
75.0000 mg | ORAL_CAPSULE | Freq: Three times a day (TID) | ORAL | Status: DC
  Administered 2013-09-17 – 2013-09-19 (×8): 75 mg via ORAL
  Filled 2013-09-16 (×8): qty 1

## 2013-09-16 MED ORDER — NAPROXEN 250 MG PO TABS
250.0000 mg | ORAL_TABLET | Freq: Two times a day (BID) | ORAL | Status: DC
  Administered 2013-09-17 – 2013-09-19 (×5): 250 mg via ORAL
  Filled 2013-09-16 (×5): qty 1

## 2013-09-16 MED ORDER — CYCLOBENZAPRINE HCL 10 MG PO TABS
10.0000 mg | ORAL_TABLET | Freq: Three times a day (TID) | ORAL | Status: DC | PRN
  Administered 2013-09-17 – 2013-09-19 (×4): 10 mg via ORAL
  Filled 2013-09-16 (×5): qty 1

## 2013-09-16 MED ORDER — DIAZEPAM 5 MG PO TABS
10.0000 mg | ORAL_TABLET | Freq: Four times a day (QID) | ORAL | Status: DC | PRN
  Administered 2013-09-18: 10 mg via ORAL
  Filled 2013-09-16: qty 2

## 2013-09-16 MED ORDER — SODIUM CHLORIDE 0.9 % IV SOLN: INTRAVENOUS | Status: DC

## 2013-09-16 MED ORDER — NAPROXEN SODIUM 220 MG PO TABS: 220.0000 mg | ORAL_TABLET | Freq: Two times a day (BID) | ORAL | Status: DC

## 2013-09-16 NOTE — ED Notes (Signed)
Drinking coke cola, tolerating well.

## 2013-09-16 NOTE — ED Provider Notes (Signed)
CSN: [id number]     Arrival date & time 09/16/13  1757 History   First MD Initiated Contact with Patient 09/16/13 1813     Chief Complaint  Patient presents with  . Emesis      HPI Pt was seen at 1830. Per pt, c/o gradual onset and persistence of constant nausea and decreased appetite for the past 5 to 6 months. Pt states he "just doesn't feel like eating."  Pt c/o mild nausea and "feeling like I'm dehydrated." States he was evaluated by his Neurosurgeon last month for his routine chronic back pain and "he told me I looked dehydrated." Pt states he has an appointment with his PMD in 2 days. Pt also c/o feeing like he "can't empty my bladder all the way" for the past 5 to 6 months. Endorses he "needed a foley catheter once for this" and "think I need another one." Pt also c/o gradual onset and persistence of constant acute flair of his chronic low back "pain" for the past 1 month. Pain worsens with palpation of the area and body position changes. Denies vomiting/diarrhea, no abd pain, no CP/palpitations, no SOB/cough, no fevers, no rash, no dysuria/hematuria, no testicular pain/swelling, no new injury, no saddle anesthesia, no incont of bowel/bladder.    Past Medical History  Diagnosis Date  . Depression   . Impaired fasting glucose   . Hypertension   . Benign neoplasm of colon   . Unspecified retinal detachment   . Tobacco use disorder   . Anxiety state, unspecified   . Essential hypertension, benign   . Elevated blood pressure reading without diagnosis of hypertension   . Causalgia of upper limb     Left limb  . Pure hypercholesterolemia   . Diabetes mellitus   . Arthritis   . Chronic leg pain   . Chronic back pain    Past Surgical History  Procedure Laterality Date  . Ankle surgery      left  . Knee surgery      left  . Appendectomy    . Colonoscopy    . Back surgery     Family History  Problem Relation Age of Onset  . COPD Mother   . Hyperthyroidism Mother   .  Hyperthyroidism Sister    History  Substance Use Topics  . Smoking status: Current Every Day Smoker -- 0.50 packs/day for 39 years    Types: Cigarettes  . Smokeless tobacco: Never Used  . Alcohol Use: No    Review of Systems ROS: Statement: All systems negative except as marked or noted in the HPI; Constitutional: Negative for fever and chills. ; ; Eyes: Negative for eye pain, redness and discharge. ; ; ENMT: Negative for ear pain, hoarseness, nasal congestion, sinus pressure and sore throat. ; ; Cardiovascular: Negative for chest pain, palpitations, diaphoresis, dyspnea and peripheral edema. ; ; Respiratory: Negative for cough, wheezing and stridor. ; ; Gastrointestinal: +nausea, decreased appetite. Negative for vomiting, diarrhea, abdominal pain, blood in stool, hematemesis, jaundice and rectal bleeding. . ; ; Genitourinary: +intemittent urinary retention. Negative for dysuria, flank pain and hematuria. ; ; Genital:  No penile drainage or rash, no testicular pain or swelling, no scrotal rash or swelling.;;  Musculoskeletal: +LBP. Negative for neck pain. Negative for swelling and trauma.; ; Skin: Negative for pruritus, rash, abrasions, blisters, bruising and skin lesion.; ; Neuro: Negative for headache, lightheadedness and neck stiffness. Negative for weakness, altered level of consciousness , altered mental status, extremity weakness, paresthesias, involuntary  movement, seizure and syncope.      Allergies  Codeine; Penicillins; Latex; and Strawberry  Home Medications   Prior to Admission medications   Medication Sig Start Date End Date Taking? Authorizing Provider  aspirin EC 81 MG tablet Take 81 mg by mouth daily.    Historical Provider, MD  benazepril (LOTENSIN) 40 MG tablet Take 40 mg by mouth every evening.     Historical Provider, MD  cyclobenzaprine (FLEXERIL) 10 MG tablet Take 10 mg by mouth 4 (four) times daily.     Historical Provider, MD  diazepam (VALIUM) 10 MG tablet Take 10 mg  by mouth every 6 (six) hours as needed for anxiety.     Historical Provider, MD  docusate sodium (COLACE) 250 MG capsule Take 1 capsule (250 mg total) by mouth daily. 08/24/13   Fransico Meadow, PA-C  lactulose (CHRONULAC) 10 GM/15ML solution Take 30 mLs by mouth once. 08/23/13   Historical Provider, MD  mineral oil enema One enema every 12 hours until relief 08/24/13   Fransico Meadow, PA-C  naproxen sodium (ANAPROX) 220 MG tablet Take 220 mg by mouth 2 (two) times daily with a meal.    Historical Provider, MD  polyethylene glycol (MIRALAX / GLYCOLAX) packet Take 17 g by mouth daily. 08/24/13   Fransico Meadow, PA-C  pravastatin (PRAVACHOL) 20 MG tablet Take 20 mg by mouth every evening.    Historical Provider, MD  predniSONE (STERAPRED UNI-PAK) 5 MG TABS tablet Take 5 mg by mouth daily. 08/17/13   Historical Provider, MD  pregabalin (LYRICA) 75 MG capsule Take 75 mg by mouth 3 (three) times daily.     Historical Provider, MD  sulfamethoxazole-trimethoprim (BACTRIM DS) 800-160 MG per tablet Take 1 tablet by mouth daily. 08/23/13   Historical Provider, MD   BP 100/71  Pulse 105  Temp(Src) 98.8 F (37.1 C) (Oral)  Resp 20  Ht 6\' 2"  (1.88 m)  Wt 148 lb (67.132 kg)  BMI 18.99 kg/m2  SpO2 100% Physical Exam 1835: Physical examination:  Nursing notes reviewed; Vital signs and O2 SAT reviewed;  Constitutional: Well developed, Well nourished, Well hydrated, In no acute distress; Head:  Normocephalic, atraumatic; Eyes: EOMI, PERRL, No scleral icterus; ENMT: Mouth and pharynx normal, Mucous membranes moist; Neck: Supple, Full range of motion, No lymphadenopathy; Cardiovascular: Regular rate and rhythm, No gallop; Respiratory: Breath sounds clear & equal bilaterally, No rales, rhonchi, wheezes.  Speaking full sentences with ease, Normal respiratory effort/excursion; Chest: Nontender, Movement normal; Abdomen: Soft, Nontender, Nondistended, Normal bowel sounds; Genitourinary: No CVA tenderness; Spine:  No midline  CS, TS, LS tenderness. +TTP bilat lumbar paraspinal muscles.;; Extremities: Pulses normal, No tenderness, No edema, No calf edema or asymmetry.; Neuro: AA&Ox3, vague, rambling historian. Major CN grossly intact.  Speech clear. No gross focal motor or sensory deficits in extremities. Climbs on and off stretcher easily by himself. Gait steady..; Skin: Color normal, Warm, Dry.   ED Course  Procedures   1915:  Pt with 272ml urine in his bladder. Pt refuses to try to urinate and is insisting he needs a foley catheter. Will place before CT scan completed.    1930:  Per ED RN, as foley was being placed pt began to spontaneously urinate approximately 29ml+.   2100:  New compression fx on CT scan today compared to MRI and CT scan last month; will need repeat MRI. Pt hypotensive and tachycardic on orthostatic VS with c/o lightheadedness. Will continue IVF, admit. T/C to [facility] Dr. Anastasio Champion,  case discussed, including:  HPI, pertinent PM/SHx, VS/PE, dx testing, ED course and treatment:  Agreeable to observation admit, requests to write temporary orders, obtain medical bed.      EKG Interpretation None      MDM  MDM Reviewed: previous chart, nursing note and vitals Reviewed previous: labs, ECG and MRI Interpretation: labs, ECG and x-ray    Results for orders placed during the hospital encounter of 09/16/13  URINALYSIS, ROUTINE W REFLEX MICROSCOPIC      Result Value Ref Range   Color, Urine YELLOW  YELLOW   APPearance CLEAR  CLEAR   Specific Gravity, Urine 1.025  1.005 - 1.030   pH 6.0  5.0 - 8.0   Glucose, UA 100 (*) NEGATIVE mg/dL   Hgb urine dipstick MODERATE (*) NEGATIVE   Bilirubin Urine NEGATIVE  NEGATIVE   Ketones, ur TRACE (*) NEGATIVE mg/dL   Protein, ur TRACE (*) NEGATIVE mg/dL   Urobilinogen, UA 1.0  0.0 - 1.0 mg/dL   Nitrite NEGATIVE  NEGATIVE   Leukocytes, UA NEGATIVE  NEGATIVE  CBC WITH DIFFERENTIAL      Result Value Ref Range   WBC 12.2 (*) 4.0 - 10.5 K/uL   RBC 3.53  (*) 4.22 - 5.81 MIL/uL   Hemoglobin 11.1 (*) 13.0 - 17.0 g/dL   HCT 31.9 (*) 39.0 - 52.0 %   MCV 90.4  78.0 - 100.0 fL   MCH 31.4  26.0 - 34.0 pg   MCHC 34.8  30.0 - 36.0 g/dL   RDW 14.0  11.5 - 15.5 %   Platelets 591 (*) 150 - 400 K/uL   Neutrophils Relative % 80 (*) 43 - 77 %   Neutro Abs 9.8 (*) 1.7 - 7.7 K/uL   Lymphocytes Relative 8 (*) 12 - 46 %   Lymphs Abs 1.0  0.7 - 4.0 K/uL   Monocytes Relative 12  3 - 12 %   Monocytes Absolute 1.5 (*) 0.1 - 1.0 K/uL   Eosinophils Relative 0  0 - 5 %   Eosinophils Absolute 0.0  0.0 - 0.7 K/uL   Basophils Relative 0  0 - 1 %   Basophils Absolute 0.0  0.0 - 0.1 K/uL  COMPREHENSIVE METABOLIC PANEL      Result Value Ref Range   Sodium 132 (*) 137 - 147 mEq/L   Potassium 4.7  3.7 - 5.3 mEq/L   Chloride 93 (*) 96 - 112 mEq/L   CO2 26  19 - 32 mEq/L   Glucose, Bld 120 (*) 70 - 99 mg/dL   BUN 23  6 - 23 mg/dL   Creatinine, Ser 0.90  0.50 - 1.35 mg/dL   Calcium 9.7  8.4 - 10.5 mg/dL   Total Protein 7.9  6.0 - 8.3 g/dL   Albumin 2.9 (*) 3.5 - 5.2 g/dL   AST 21  0 - 37 U/L   ALT 21  0 - 53 U/L   Alkaline Phosphatase 143 (*) 39 - 117 U/L   Total Bilirubin 0.4  0.3 - 1.2 mg/dL   GFR calc non Af Amer 89 (*) >90 mL/min   GFR calc Af Amer >90  >90 mL/min  LIPASE, BLOOD      Result Value Ref Range   Lipase 11  11 - 59 U/L  TROPONIN I      Result Value Ref Range   Troponin I <0.30  <0.30 ng/mL  URINE MICROSCOPIC-ADD ON      Result Value Ref Range   WBC,  UA 0-2  <3 WBC/hpf   RBC / HPF 3-6  <3 RBC/hpf   Bacteria, UA FEW (*) RARE   Mr Lumbar Spine Wo Contrast 08/22/2013   CLINICAL DATA:  Severe low back pain since surgery 05/02/2013. Bilateral leg pain and weakness.  EXAM: MRI LUMBAR SPINE WITHOUT CONTRAST  TECHNIQUE: Multiplanar, multisequence MR imaging was performed. No intravenous contrast was administered.  COMPARISON:  Lumbar spine radiographs 05/04/2013. Preoperative MRI of the lumbar spine 04/27/2013.  FINDINGS: Normal signal is present  in the conus medullaris which terminates at L1-2. Patient is status post PLIF at L1-2. Slight anterolisthesis at L3-4 and L4-5 is stable. Limited imaging of the abdomen is unremarkable.  Facet hypertrophy at T11-12 results and mild foraminal narrowing bilaterally, right greater than left.  T12-L1:  Negative.  L1-2: The patient is status post laminectomy. Metallic artifact somewhat obscures the level. The posterior impression on the central canal has been removed. The central canal is widely patent. Mild residual foraminal narrowing is present on the right.  L2-3: A broad-based disc herniation and asymmetric right-sided facet hypertrophy are stable. Mild right lateral recess and foraminal stenosis are again seen.  L3-4: Mild disc bulging and moderate facet hypertrophy is present. Mild right foraminal narrowing is stable.  L4-5: A far left lateral disc protrusion and annular tear is noted. Mild facet hypertrophy is present. This results in mild left foraminal narrowing without change.  L5-S1: Mild disc bulging and facet hypertrophy is present. There is no significant stenosis or change.  IMPRESSION: 1. Status post PLIF at L1-2 with decompression of the spinal canal. 2. Residual mild right foraminal narrowing at L1-2. 3. Broad-based disc herniation and asymmetric right-sided facet hypertrophy at L2-3 a lead to mild stable right lateral recess and foraminal narrowing. 4. Mild right foraminal narrowing at L3-4. 5. Stable left foraminal narrowing at L4-5.   Electronically Signed   By: Lawrence Santiago M.D.   On: 08/22/2013 10:32   Ct Abdomen Pelvis W Contrast 09/16/2013   CLINICAL DATA:  Nausea and vomiting.  Difficulty emptying bladder.  EXAM: CT ABDOMEN AND PELVIS WITH CONTRAST  TECHNIQUE: Multidetector CT imaging of the abdomen and pelvis was performed using the standard protocol following bolus administration of intravenous contrast.  CONTRAST:  60mL OMNIPAQUE IOHEXOL 300 MG/ML SOLN, 153mL OMNIPAQUE IOHEXOL 300 MG/ML  SOLN  COMPARISON:  CT ABD - PELV W/ CM dated 08/24/2013; MR L SPINE W/O dated 08/22/2013  FINDINGS: Wall thickening in the distal esophagus, possible hiatal hernia.  Stable small benign-appearing hypodense lesions in the liver left hepatic lobe, likely cysts.  The spleen, pancreas, and adrenal glands appear unremarkable.  Stable 9 mm hypodense lesion of the right mid kidney posteriorly is technically too small to characterize.  Mild infrarenal abdominal aortic ectasia. 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  Prominent stool throughout the colon favors constipation. Urinary bladder is relatively empty. Appendix surgically absent. If  Prior posterior decompression at the L1-2 level with posterolateral rod and pedicle screw fixation. There is worsened lucency around the left L1 pedicle screw and the right L2 pedicle screw. New compression fracture and demineralization of the superior endplate of L2 with surrounding paraspinal soft tissue density probably reflecting hematoma, less likely infection/phlegmon. There is 4 mm of posterior subluxation at L1-2, previously 3 mm. The spinal canal is difficult to assess at this level due to the streak artifact. There filling defects in the small bowel contrast column probably from solid materials in the fecal stream rather than  polyps.  IMPRESSION: 1. New compression fracture along the superior endplate of L2, with abnormal lucency around the left L1 pedicle screw and right L2 pedicle screw which could be due to loosening or infection. There is new paraspinal soft tissue density in the vicinity of the suspected compression fracture, favoring paraspinal hematoma, with paraspinal infection less likely. 2. Prominent constipation. 3. Wall thickening in the distal esophagus and probable small hiatal hernia. 4. 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   Electronically Signed   By: Sherryl Barters M.D.   On:  09/16/2013 20:51   Dg Chest 2 View 09/16/2013   CLINICAL DATA:  Nausea and vomiting since Tuesday  EXAM: CHEST  2 VIEW  COMPARISON:  June 05, 2009  FINDINGS: The heart size and mediastinal contours are within normal limits. There is no focal infiltrate, pulmonary edema, or pleural effusion. Bilateral symmetrical nipple shadows are noted. Patient status post prior fixation of lower thoracic upper lumbar spine.  IMPRESSION: No active cardiopulmonary disease.   Electronically Signed   By: Abelardo Diesel M.D.   On: 09/16/2013 21:10      Alfonzo Feller, DO 09/17/13 [phone]

## 2013-09-16 NOTE — ED Notes (Signed)
Pt c/o n/v since Tuesday, is also having problems with not emptying his bladder since before march, states that he has had to have a foley bag.

## 2013-09-16 NOTE — H&P (Signed)
[facility] History and Physical  Marc Rose PRF:[id number] DOB: 1951-01-25 DOA: 09/16/2013  Referring physician: ER. PCP: Stephens Shire, MD   Chief Complaint: Low back pain, constipation, urinary incontinence.  HPI: Marc Rose is a 63 y.o. male  This is a 63 year old man who is rather vague with his history. Apparently, he had low back surgery at the end of 2014. This was in [facility]. He says that for the last week he has been constipated, has had a poor intake and also has had difficulty in passing urine. When he came to the emergency room, he was actually able to pass urine. It was felt that he was dehydrated and when he was being evaluated in the emergency room, he was found to be hypotensive. He is now being admitted for further management.   Review of Systems:  Constitutional:  No weight loss, night sweats, Fevers, chills, fatigue.  HEENT:  No headaches, Difficulty swallowing,Tooth/dental problems,Sore throat,  No sneezing, itching, ear ache, nasal congestion, post nasal drip,  Cardio-vascular:  No chest pain, Orthopnea, PND, swelling in lower extremities, anasarca, dizziness, palpitations  GI:  No heartburn, indigestion, abdominal pain, nausea, vomiting, diarrhea, change in bowel habits, loss of appetite  Resp:  No shortness of breath with exertion or at rest. No excess mucus, no productive cough, No non-productive cough, No coughing up of blood.No change in color of mucus.No wheezing.No chest wall deformity  Skin:  no rash or lesions.  GU:  no dysuria, change in color of urine, no urgency or frequency. No flank pain.  Musculoskeletal:  No joint pain or swelling. No decreased range of motion. No back pain.  Psych:  No change in mood or affect. No depression or anxiety. No memory loss.   Past Medical History  Diagnosis Date  . Depression   . Impaired fasting glucose   . Hypertension   . Benign neoplasm of colon   . Unspecified  retinal detachment   . Tobacco use disorder   . Anxiety state, unspecified   . Essential hypertension, benign   . Elevated blood pressure reading without diagnosis of hypertension   . Causalgia of upper limb     Left limb  . Pure hypercholesterolemia   . Diabetes mellitus   . Arthritis   . Chronic leg pain   . Chronic back pain    Past Surgical History  Procedure Laterality Date  . Ankle surgery      left  . Knee surgery      left  . Appendectomy    . Colonoscopy    . Back surgery     Social History:  reports that he has been smoking Cigarettes.  He has a 19.5 pack-year smoking history. He has never used smokeless tobacco. He reports that he uses illicit drugs (Marijuana). He reports that he does not drink alcohol.  Allergies  Allergen Reactions  . Codeine Anaphylaxis, Hives and Swelling  . Penicillins Anaphylaxis, Hives and Swelling  . Latex Itching  . Strawberry Hives    Family History  Problem Relation Age of Onset  . COPD Mother   . Hyperthyroidism Mother   . Hyperthyroidism Sister      Prior to Admission medications   Medication Sig Start Date End Date Taking? Authorizing Provider  aspirin EC 81 MG tablet Take 81 mg by mouth daily.   Yes Historical Provider, MD  benazepril (LOTENSIN) 40 MG tablet Take 40 mg by mouth every evening.    Yes Historical  Provider, MD  cyclobenzaprine (FLEXERIL) 10 MG tablet Take 10 mg by mouth 4 (four) times daily.    Yes Historical Provider, MD  diazepam (VALIUM) 10 MG tablet Take 10 mg by mouth every 6 (six) hours as needed for anxiety.    Yes Historical Provider, MD  docusate sodium (COLACE) 100 MG capsule Take 100 mg by mouth 2 (two) times daily.   Yes Historical Provider, MD  mineral oil enema One enema every 12 hours until relief 08/24/13  Yes Fransico Meadow, PA-C  naproxen sodium (ANAPROX) 220 MG tablet Take 220 mg by mouth 2 (two) times daily with a meal.   Yes Historical Provider, MD  pravastatin (PRAVACHOL) 20 MG tablet Take  20 mg by mouth every evening.   Yes Historical Provider, MD  pregabalin (LYRICA) 75 MG capsule Take 75 mg by mouth 3 (three) times daily.    Yes Historical Provider, MD   Physical Exam: Filed Vitals:   09/16/13 2142  BP: 112/68  Pulse: 94  Temp:   Resp: 18    BP 112/68  Pulse 94  Temp(Src) 98.8 F (37.1 C) (Oral)  Resp 18  Ht 6\' 2"  (1.88 m)  Wt 67.132 kg (148 lb)  BMI 18.99 kg/m2  SpO2 99%  General:  Appears calm and comfortable. Anxious. Blood pressure has improved since he was given IV fluids. He is not clinically shocked. There is no increased work of breathing. Eyes: PERRL, normal lids, irises & conjunctiva ENT: grossly normal hearing, lips & tongue Neck: no LAD, masses or thyromegaly Cardiovascular: RRR, no m/r/g. No LE edema. Telemetry: SR, no arrhythmias  Respiratory: CTA bilaterally, no w/r/r. Normal respiratory effort. Abdomen: soft, ntnd Skin: no rash or induration seen on limited exam Musculoskeletal: grossly normal tone BUE/BLE Psychiatric: grossly normal mood and affect, speech fluent and appropriate Neurologic: grossly non-focal.In particular there is no  leg weakness. There is no facial asymmetry. There is no arm weakness.           Labs on Admission:  Basic Metabolic Panel:  Recent Labs Lab 09/16/13 1850  NA 132*  K 4.7  CL 93*  CO2 26  GLUCOSE 120*  BUN 23  CREATININE 0.90  CALCIUM 9.7   Liver Function Tests:  Recent Labs Lab 09/16/13 1850  AST 21  ALT 21  ALKPHOS 143*  BILITOT 0.4  PROT 7.9  ALBUMIN 2.9*    Recent Labs Lab 09/16/13 1850  LIPASE 11    CBC:  Recent Labs Lab 09/16/13 1850  WBC 12.2*  NEUTROABS 9.8*  HGB 11.1*  HCT 31.9*  MCV 90.4  PLT 591*   Cardiac Enzymes:  Recent Labs Lab 09/16/13 1850  TROPONINI <0.30      Radiological Exams on Admission: Dg Chest 2 View  09/16/2013   CLINICAL DATA:  Nausea and vomiting since Tuesday  EXAM: CHEST  2 VIEW  COMPARISON:  June 05, 2009  FINDINGS: The  heart size and mediastinal contours are within normal limits. There is no focal infiltrate, pulmonary edema, or pleural effusion. Bilateral symmetrical nipple shadows are noted. Patient status post prior fixation of lower thoracic upper lumbar spine.  IMPRESSION: No active cardiopulmonary disease.   Electronically Signed   By: Abelardo Diesel M.D.   On: 09/16/2013 21:10   Ct Abdomen Pelvis W Contrast  09/16/2013   CLINICAL DATA:  Nausea and vomiting.  Difficulty emptying bladder.  EXAM: CT ABDOMEN AND PELVIS WITH CONTRAST  TECHNIQUE: Multidetector CT imaging of the abdomen and pelvis was  performed using the standard protocol following bolus administration of intravenous contrast.  CONTRAST:  70mL OMNIPAQUE IOHEXOL 300 MG/ML SOLN, 163mL OMNIPAQUE IOHEXOL 300 MG/ML SOLN  COMPARISON:  CT ABD - PELV W/ CM dated 08/24/2013; MR L SPINE W/O dated 08/22/2013  FINDINGS: Wall thickening in the distal esophagus, possible hiatal hernia.  Stable small benign-appearing hypodense lesions in the liver left hepatic lobe, likely cysts.  The spleen, pancreas, and adrenal glands appear unremarkable.  Stable 9 mm hypodense lesion of the right mid kidney posteriorly is technically too small to characterize.  Mild infrarenal abdominal aortic ectasia. 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  Prominent stool throughout the colon favors constipation. Urinary bladder is relatively empty. Appendix surgically absent. If  Prior posterior decompression at the L1-2 level with posterolateral rod and pedicle screw fixation. There is worsened lucency around the left L1 pedicle screw and the right L2 pedicle screw. New compression fracture and demineralization of the superior endplate of L2 with surrounding paraspinal soft tissue density probably reflecting hematoma, less likely infection/phlegmon. There is 4 mm of posterior subluxation at L1-2, previously 3 mm. The spinal canal is difficult to assess at this level due to the streak  artifact. There filling defects in the small bowel contrast column probably from solid materials in the fecal stream rather than polyps.  IMPRESSION: 1. New compression fracture along the superior endplate of L2, with abnormal lucency around the left L1 pedicle screw and right L2 pedicle screw which could be due to loosening or infection. There is new paraspinal soft tissue density in the vicinity of the suspected compression fracture, favoring paraspinal hematoma, with paraspinal infection less likely. 2. Prominent constipation. 3. Wall thickening in the distal esophagus and probable small hiatal hernia. 4. 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   Electronically Signed   By: Sherryl Barters M.D.   On: 09/16/2013 20:51    EKG: Independently reviewed. Normal sinus rhythm, no acute ST-T wave changes.  Assessment/Plan   1. Hypotension. 2. Dehydration from poor by mouth intake leading to #1. 3. Low back pain with new compression fracture along the superior endplate of L2. There is also a new paraspinal soft tissue density in the vicinity of the suspected L2 fracture.  Plan: 1. Admit to medical floor. 2. IV fluids. 3. MRI lumbar spine tomorrow to further evaluate the abnormality seen on the CT scan of his abdomen/pelvis.  Further recommendations will depend on patient's hospital progress.  Code Status: Full code.  Family Communication: I discussed the plan with the patient at the bedside.  Disposition Plan: Home when medically stable.   Time spent: 60 minutes.  [facility] Pager [phone].

## 2013-09-16 NOTE — ED Notes (Addendum)
Marc Rose, NT and this nurse attempted to insert foley. Unable to get any urine output. Pt was asked to try really hard and urinate. Pt urinated catheter out, urinated all over himself, his bed, nurse, and the floor. Total collected output was 263ml. EDP notified and is aware.

## 2013-09-17 ENCOUNTER — Encounter (HOSPITAL_COMMUNITY): Payer: Self-pay | Admitting: *Deleted

## 2013-09-17 ENCOUNTER — Observation Stay (HOSPITAL_COMMUNITY): Payer: Medicare Other

## 2013-09-17 DIAGNOSIS — K6812 Psoas muscle abscess: Secondary | ICD-10-CM

## 2013-09-17 DIAGNOSIS — M519 Unspecified thoracic, thoracolumbar and lumbosacral intervertebral disc disorder: Secondary | ICD-10-CM

## 2013-09-17 LAB — COMPREHENSIVE METABOLIC PANEL
ALBUMIN: 2.4 g/dL — AB (ref 3.5–5.2)
ALT: 18 U/L (ref 0–53)
AST: 21 U/L (ref 0–37)
Alkaline Phosphatase: 125 U/L — ABNORMAL HIGH (ref 39–117)
BUN: 15 mg/dL (ref 6–23)
CALCIUM: 9.1 mg/dL (ref 8.4–10.5)
CO2: 27 mEq/L (ref 19–32)
CREATININE: 0.8 mg/dL (ref 0.50–1.35)
Chloride: 96 mEq/L (ref 96–112)
GFR calc Af Amer: >90 mL/min (ref >90)
GFR calc non Af Amer: >90 mL/min (ref >90)
Glucose, Bld: 103 mg/dL — ABNORMAL HIGH (ref 70–99)
Potassium: 4 mEq/L (ref 3.7–5.3)
Sodium: 136 mEq/L — ABNORMAL LOW (ref 137–147)
TOTAL PROTEIN: 7 g/dL (ref 6.0–8.3)
Total Bilirubin: 0.5 mg/dL (ref 0.3–1.2)

## 2013-09-17 LAB — CBC
HCT: 30 % — ABNORMAL LOW (ref 39.0–52.0)
HEMOGLOBIN: 10.2 g/dL — AB (ref 13.0–17.0)
MCH: 30.7 pg (ref 26.0–34.0)
MCHC: 34 g/dL (ref 30.0–36.0)
MCV: 90.4 fL (ref 78.0–100.0)
Platelets: 604 10*3/uL — ABNORMAL HIGH (ref 150–400)
RBC: 3.32 MIL/uL — AB (ref 4.22–5.81)
RDW: 13.8 % (ref 11.5–15.5)
WBC: 10.8 10*3/uL — ABNORMAL HIGH (ref 4.0–10.5)

## 2013-09-17 LAB — HEMOGLOBIN A1C
HEMOGLOBIN A1C: 6.5 % — AB (ref <5.7)
MEAN PLASMA GLUCOSE: 140 mg/dL — AB (ref <117)

## 2013-09-17 LAB — GLUCOSE, CAPILLARY: GLUCOSE-CAPILLARY: 110 mg/dL — AB (ref 70–99)

## 2013-09-17 MED ORDER — INSULIN ASPART 100 UNIT/ML ~~LOC~~ SOLN: 0.0000 [IU] | Freq: Every day | SUBCUTANEOUS | Status: DC

## 2013-09-17 MED ORDER — SODIUM CHLORIDE 0.9 % IV BOLUS (SEPSIS)
1000.0000 mL | Freq: Once | INTRAVENOUS | Status: AC
  Administered 2013-09-17: 1000 mL via INTRAVENOUS

## 2013-09-17 MED ORDER — INSULIN ASPART 100 UNIT/ML ~~LOC~~ SOLN
0.0000 [IU] | Freq: Three times a day (TID) | SUBCUTANEOUS | Status: DC
  Administered 2013-09-18: 1 [IU] via SUBCUTANEOUS
  Administered 2013-09-18 (×2): 2 [IU] via SUBCUTANEOUS

## 2013-09-17 MED ORDER — LEVOFLOXACIN IN D5W 750 MG/150ML IV SOLN
750.0000 mg | INTRAVENOUS | Status: DC
  Administered 2013-09-17 – 2013-09-18 (×2): 750 mg via INTRAVENOUS
  Filled 2013-09-17 (×2): qty 150

## 2013-09-17 MED ORDER — VANCOMYCIN HCL 10 G IV SOLR
1250.0000 mg | Freq: Two times a day (BID) | INTRAVENOUS | Status: DC
  Administered 2013-09-17 – 2013-09-19 (×4): 1250 mg via INTRAVENOUS
  Filled 2013-09-17 (×6): qty 1250

## 2013-09-17 NOTE — Care Management Note (Addendum)
    Page 1 of 1   09/18/2013     1:55:24 PM CARE MANAGEMENT NOTE 09/18/2013  Patient:  Marc Rose, Marc Rose   Account Number:  [id number]  Date Initiated:  09/17/2013  Documentation initiated by:  Claretha Cooper  Subjective/Objective Assessment:   Pt admitted from home where he lives at home. Lives alone but states there is a Marine scientist that lives upstairs, currently watching his dog. Will follow for needs at home     Action/Plan:   Anticipated DC Date:     Anticipated DC Plan:  [place]  CM consult      Choice offered to / List presented to:             Status of service:  Completed, signed off Medicare Important Message given?  NA - LOS <3 / Initial given by admissions (If response is "NO", the following Medicare IM given date fields will be blank) Date Medicare IM given:   Date Additional Medicare IM given:    Discharge Disposition:  ACUTE TO ACUTE TRANS  Per UR Regulation:    If discussed at Long Length of Stay Meetings, dates discussed:    Comments:  09/18/13 Claretha Cooper RN BSN CM Pt transferring to [facility] where his spine surgeon is located.  09/17/13 Claretha Cooper RN BNS CM

## 2013-09-17 NOTE — Progress Notes (Signed)
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 Johnnye Sima recommended disc aspiration IR or Orthopedics here at [facility]>> to hold off antibiotic prior to aspiration -I called the patient's orthopedic/Spine surgeon Dr. Sherlyn Lick [phone] and after reviewing his MRI he states that he does not recommend aspiration as the infection/osteo is anteriorly and the laminectomy was the posterior area and he is concerned that 2 in an aspiration will cause contamination at the surgical site>> he recommends to start patient on empiric antibiotics  and have him transfer to the Hospitalist service at [facility] and he will see him in consultation -Have ordered her blood cultures and start the patient on empiric Levaquin and vancomycin -I called [facility] hospitalists>> discussed patient with Dr. Reece Levy [phone] accepted him to a telemetry bed, but they have no beds available today.  Volume depletion/hyponatremia  -continue hydration, sodium improved, follow and recheck Hypotension -Infection versus hypovolemia  -Blood cultures and empiric antibiotics as above  -bolus, Continue hydration and follow  Status post L1-2 laminectomy-12/31 per Dr. Sherlyn Lick History of Diabetes mellitus -No meds listed on his preadmission list -Placed on modified carb diet, monitor Accu-Cheks, obtain A1c and cover with SSI follow and further treat accordingly. History of hypertension Code Status: Is full Family Communication: None at bed side Disposition Plan: Transfer to [facility] when Tele bed available   Consultants:  Orthopedics/spine surgery- Dr. Sherlyn Lick via phone [phone] >>To see pt in consultation at [facility]  Procedures:  none  Antibiotics:  Levaquin started 5/18  Vancomycin  started 5/18  HPI/Subjective: Complaints of back pain, he denies leg weakness, also no numbness or tingling. denies nausea vomiting  Objective: Filed Vitals:   09/17/13 0511  BP: 111/73  Pulse: 88  Temp: 98.8 F (37.1 C)  Resp: 18    Intake/Output Summary (Last 24 hours) at 09/17/13 1319 Last data filed at 09/17/13 0221  Gross per 24 hour  Intake      0 ml  Output    850 ml  Net   -850 ml   Filed Weights   09/16/13 1802 09/16/13 2249  Weight: 67.132 kg (148 lb) 63.7 kg (140 lb 6.9 oz)    Exam:  General: alert & oriented x 3In NAD Cardiovascular: RRR, nl S1 s2 Respiratory: CTAB Abdomen: soft +BS NT/ND, no masses palpable Extremities: No cyanosis and no edema Neuro: Alert and oriented x3 strength 5 out of 5 and symmetric, sensory grossly intact.    Data Reviewed: Basic Metabolic Panel:  Recent Labs Lab 09/16/13 1850 09/17/13 0537  NA 132* 136*  K 4.7 4.0  CL 93* 96  CO2 26 27  GLUCOSE 120* 103*  BUN 23 15  CREATININE 0.90 0.80  CALCIUM 9.7 9.1   Liver Function Tests:  Recent Labs Lab 09/16/13 1850 09/17/13 0537  AST 21 21  ALT 21 18  ALKPHOS 143* 125*  BILITOT 0.4 0.5  PROT 7.9 7.0  ALBUMIN 2.9* 2.4*    Recent Labs Lab 09/16/13 1850  LIPASE 11   No results found for this basename: AMMONIA,  in the last 168 hours CBC:  Recent Labs Lab 09/16/13 1850 09/17/13 0537  WBC 12.2* 10.8*  NEUTROABS 9.8*  --   HGB 11.1* 10.2*  HCT 31.9* 30.0*  MCV 90.4 90.4  PLT 591* 604*   Cardiac Enzymes:  Recent Labs Lab  09/16/13 1850  TROPONINI <0.30   BNP (last 3 results) No results found for this basename: PROBNP,  in the last 8760 hours CBG: No results found for this basename: GLUCAP,  in the last 168 hours  No results found for this or any previous visit (from the past 240 hour(s)).   Studies: Dg Chest 2 View  09/16/2013   CLINICAL DATA:  Nausea and vomiting since Tuesday  EXAM: CHEST  2 VIEW  COMPARISON:  June 05, 2009  FINDINGS: The  heart size and mediastinal contours are within normal limits. There is no focal infiltrate, pulmonary edema, or pleural effusion. Bilateral symmetrical nipple shadows are noted. Patient status post prior fixation of lower thoracic upper lumbar spine.  IMPRESSION: No active cardiopulmonary disease.   Electronically Signed   By: Abelardo Diesel M.D.   On: 09/16/2013 21:10   Mr Lumbar Spine Wo Contrast  09/17/2013   CLINICAL DATA:  Low back pain with constipation and urinary incontinence. Prior lumbar spine surgery.  EXAM: MRI LUMBAR SPINE WITHOUT CONTRAST  TECHNIQUE: Multiplanar, multisequence MR imaging of the lumbar spine was performed. No intravenous contrast was administered.  COMPARISON:  Lumbar spine MRI 08/22/2013. CT abdomen and pelvis 09/16/2013.  FINDINGS: Trace anterolisthesis of L3 on L4 and L4 on L5 is unchanged. There is grade 1 retrolisthesis of L1 on L2, stable to minimally increased from prior MRI. New from the prior MRI is a mild compression deformity involving the L2 superior endplate with approximately 10% vertebral body height loss. There is abnormal fluid signal within the L1-2 disc space, and there is also new edema throughout the L1 and L2 vertebral bodies. There is paraspinal inflammatory change/ phlegmon at this level, and there is a 1.9 x 1.3 cm fluid collection in the left psoas muscle at the L2 level (series 5, image 16).  Advanced disc space height loss at L5-S1 is unchanged, however there is increased fluid signal within the disc space compared to the prior MRI with mildly increased marrow edema within the adjacent L5 and S1 vertebral bodies. The conus medullaris is normal in signal and terminates at the superior aspect of L2.  T11-12: Only imaged sagittally. Facet hypertrophy results in mild bilateral neural foraminal narrowing, right greater than left and unchanged.  T12-L1:  Negative.  L1-2: Sequelae of prior laminectomy and posterior fusion are again identified. Posterior  retropulsion of the superior L2 endplate, greater on the right, with possibly small adjacent epidural fluid collection, results in new narrowing of the right lateral recess and increased right neural foraminal narrowing. No spinal canal stenosis.  L2-3: Mild disc bulge and facet hypertrophy result in mild right lateral recess and neural foraminal narrowing, unchanged.  L3-4: Mild disc bulge and moderate facet hypertrophy result in mild right neural foraminal narrowing, unchanged. No spinal canal stenosis.  L4-5: Left foraminal disc protrusion and mild-to-moderate facet hypertrophy result in mild to moderate left neural foraminal stenosis, unchanged. No spinal canal stenosis.  L5-S1: Disc bulge and mild facet hypertrophy result in mild to moderate bilateral neural foraminal stenosis, unchanged. No spinal canal stenosis.  IMPRESSION: 1. Changes at L1-2 as above concerning for discitis/osteomyelitis with small left psoas abscess. New, mild compression deformity of the L2 superior endplate and mild retropulsion results in new right lateral recess narrowing and increased right neural foraminal narrowing at L1-2. Small ventral epidural fluid collection is not excluded on the right. 2. Advanced degenerative disc disease at L5-S1 with a small amount of new fluid signal in the disc  space and mildly increased adjacent marrow changes. Early discitis/osteomyelitis is not excluded. These results will be called to the ordering clinician or representative by the Radiologist Assistant, and communication documented in the PACS or zVision Dashboard.   Electronically Signed   By: Logan Bores   On: 09/17/2013 09:20   Ct Abdomen Pelvis W Contrast  09/16/2013   CLINICAL DATA:  Nausea and vomiting.  Difficulty emptying bladder.  EXAM: CT ABDOMEN AND PELVIS WITH CONTRAST  TECHNIQUE: Multidetector CT imaging of the abdomen and pelvis was performed using the standard protocol following bolus administration of intravenous contrast.   CONTRAST:  42mL OMNIPAQUE IOHEXOL 300 MG/ML SOLN, 113mL OMNIPAQUE IOHEXOL 300 MG/ML SOLN  COMPARISON:  CT ABD - PELV W/ CM dated 08/24/2013; MR L SPINE W/O dated 08/22/2013  FINDINGS: Wall thickening in the distal esophagus, possible hiatal hernia.  Stable small benign-appearing hypodense lesions in the liver left hepatic lobe, likely cysts.  The spleen, pancreas, and adrenal glands appear unremarkable.  Stable 9 mm hypodense lesion of the right mid kidney posteriorly is technically too small to characterize.  Mild infrarenal abdominal aortic ectasia. 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  Prominent stool throughout the colon favors constipation. Urinary bladder is relatively empty. Appendix surgically absent. If  Prior posterior decompression at the L1-2 level with posterolateral rod and pedicle screw fixation. There is worsened lucency around the left L1 pedicle screw and the right L2 pedicle screw. New compression fracture and demineralization of the superior endplate of L2 with surrounding paraspinal soft tissue density probably reflecting hematoma, less likely infection/phlegmon. There is 4 mm of posterior subluxation at L1-2, previously 3 mm. The spinal canal is difficult to assess at this level due to the streak artifact. There filling defects in the small bowel contrast column probably from solid materials in the fecal stream rather than polyps.  IMPRESSION: 1. New compression fracture along the superior endplate of L2, with abnormal lucency around the left L1 pedicle screw and right L2 pedicle screw which could be due to loosening or infection. There is new paraspinal soft tissue density in the vicinity of the suspected compression fracture, favoring paraspinal hematoma, with paraspinal infection less likely. 2. Prominent constipation. 3. Wall thickening in the distal esophagus and probable small hiatal hernia. 4. Infrarenal abdominal aortic ectasia with small right common iliac artery aneurysm,  2 cm in diameter.   Electronically Signed   By: Sherryl Barters M.D.   On: 09/16/2013 20:51    Scheduled Meds: . aspirin EC  81 mg Oral Daily  . docusate sodium  100 mg Oral BID  . heparin  5,000 Units Subcutaneous 3 times per day  . naproxen  250 mg Oral BID WC  . pregabalin  75 mg Oral TID  . simvastatin  10 mg Oral q1800   Continuous Infusions: . sodium chloride 100 mL/hr at 09/17/13 1117    Active Problems:   Hypotension   HTN (hypertension)   Anxiety   Back pain    Time spent: [place] [facility] Pager [phone]  7PM-7AM, please contact night-coverage at [email or web address] password [facility] 09/17/2013, 1:19 PM  LOS: 1 day

## 2013-09-17 NOTE — Progress Notes (Signed)
Patient receiving a 1 liter bolus of Normal Saline per Dr Christoper Fabian for blood pressure,bolus is to infuse over 2 hours,rate NS at 500 mL/hr.Will continue to monitor patient .No c/o pain or discomfort noted.

## 2013-09-17 NOTE — Care Management Utilization Note (Signed)
UR completed 

## 2013-09-17 NOTE — Progress Notes (Signed)
Patient's MRI results called to Dr Veleta Miners received,and given.Will continue to monitor patien

## 2013-09-17 NOTE — Progress Notes (Signed)
ANTIBIOTIC CONSULT NOTE - INITIAL  Pharmacy Consult for Vancomycin & Levaquin Indication: osteomyelitis. discitis  Allergies  Allergen Reactions  . Codeine Anaphylaxis, Hives and Swelling  . Penicillins Anaphylaxis, Hives and Swelling  . Latex Itching  . Strawberry Hives    Patient Measurements: Height: 6\' 2"  (188 cm) Weight: 140 lb 6.9 oz (63.7 kg) IBW/kg (Calculated) : 82.2  Vital Signs: Temp: 98.8 F (37.1 C) (05/18 0511) Temp src: Oral (05/18 0511) BP: 111/73 mmHg (05/18 0511) Pulse Rate: 88 (05/18 0511) Intake/Output from previous day: 05/17 0701 - 05/18 0700 In: -  Out: 850 [Urine:850] Intake/Output from this shift:    Labs:  Recent Labs  09/16/13 1850 09/17/13 0537  WBC 12.2* 10.8*  HGB 11.1* 10.2*  PLT 591* 604*  CREATININE 0.90 0.80   Estimated Creatinine Clearance: 85.2 ml/min (by C-G formula based on Cr of 0.8). No results found for this basename: VANCOTROUGH, VANCOPEAK, VANCORANDOM, GENTTROUGH, GENTPEAK, GENTRANDOM, TOBRATROUGH, TOBRAPEAK, TOBRARND, AMIKACINPEAK, AMIKACINTROU, AMIKACIN,  in the last 72 hours   Microbiology: No results found for this or any previous visit (from the past 720 hour(s)).  Medical History: Past Medical History  Diagnosis Date  . Depression   . Impaired fasting glucose   . Hypertension   . Benign neoplasm of colon   . Unspecified retinal detachment   . Tobacco use disorder   . Anxiety state, unspecified   . Essential hypertension, benign   . Elevated blood pressure reading without diagnosis of hypertension   . Causalgia of upper limb     Left limb  . Pure hypercholesterolemia   . Diabetes mellitus   . Arthritis   . Chronic leg pain   . Chronic back pain     Medications:  Scheduled:  . aspirin EC  81 mg Oral Daily  . docusate sodium  100 mg Oral BID  . heparin  5,000 Units Subcutaneous 3 times per day  . naproxen  250 mg Oral BID WC  . pregabalin  75 mg Oral TID  . simvastatin  10 mg Oral q1800    Assessment: 63 yo M with hx of spinal surgery in 2014 presented to [facility] with low back pain & dehydration.  MRI + discitis/osteomyelitis with small left psoas abscess at L1-2 and changes at L5-S1 that cannot be excluded as early discitis/osteomyelitis.  Patient is afebrile with mildly elevated WBC.  Renal function is at patient's baseline.  Antibiotics per ID recommendations.   Vancomycin 5/18>> Levaquin 5/18>>  Goal of Therapy:  Vancomycin trough level 15-20 mcg/ml  Plan:  Levaquin 750mg  IV q24h Vancomycin 1250mg  IV q12h Check Vancomycin trough at steady state Monitor renal function and cx data   Biagio Borg 09/17/2013,2:37 PM

## 2013-09-18 DIAGNOSIS — E119 Type 2 diabetes mellitus without complications: Secondary | ICD-10-CM

## 2013-09-18 LAB — URINE CULTURE
COLONY COUNT: NO GROWTH
CULTURE: NO GROWTH

## 2013-09-18 LAB — GLUCOSE, CAPILLARY
GLUCOSE-CAPILLARY: 108 mg/dL — AB (ref 70–99)
GLUCOSE-CAPILLARY: 123 mg/dL — AB (ref 70–99)
GLUCOSE-CAPILLARY: 146 mg/dL — AB (ref 70–99)
Glucose-Capillary: 167 mg/dL — ABNORMAL HIGH (ref 70–99)
Glucose-Capillary: 133 mg/dL — ABNORMAL HIGH (ref 70–99)

## 2013-09-18 LAB — CBC
HCT: 29.9 % — ABNORMAL LOW (ref 39.0–52.0)
Hemoglobin: 9.9 g/dL — ABNORMAL LOW (ref 13.0–17.0)
MCH: 30.5 pg (ref 26.0–34.0)
MCHC: 33.1 g/dL (ref 30.0–36.0)
MCV: 92 fL (ref 78.0–100.0)
PLATELETS: 588 10*3/uL — AB (ref 150–400)
RBC: 3.25 MIL/uL — ABNORMAL LOW (ref 4.22–5.81)
RDW: 14 % (ref 11.5–15.5)
WBC: 8.9 10*3/uL (ref 4.0–10.5)

## 2013-09-18 LAB — BASIC METABOLIC PANEL
BUN: 15 mg/dL (ref 6–23)
CHLORIDE: 104 meq/L (ref 96–112)
CO2: 26 mEq/L (ref 19–32)
Calcium: 8.7 mg/dL (ref 8.4–10.5)
Creatinine, Ser: 0.76 mg/dL (ref 0.50–1.35)
GFR calc non Af Amer: >90 mL/min (ref >90)
Glucose, Bld: 99 mg/dL (ref 70–99)
POTASSIUM: 4.2 meq/L (ref 3.7–5.3)
SODIUM: 141 meq/L (ref 137–147)

## 2013-09-18 MED ORDER — OXYCODONE HCL 5 MG PO TABS
5.0000 mg | ORAL_TABLET | Freq: Four times a day (QID) | ORAL | Status: DC | PRN
  Administered 2013-09-19 (×2): 5 mg via ORAL
  Filled 2013-09-18 (×2): qty 1

## 2013-09-18 MED ORDER — TRAMADOL HCL 50 MG PO TABS
50.0000 mg | ORAL_TABLET | Freq: Four times a day (QID) | ORAL | Status: DC | PRN
  Filled 2013-09-18: qty 1

## 2013-09-18 NOTE — Progress Notes (Signed)
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 Johnnye Sima recommended disc aspiration IR or Orthopedics here at [facility]>> to hold off antibiotic prior to aspiration, spoke with Dr.[facility] here, he dose not do back surgeries or aspiration. -I called the patient's orthopedic/Spine surgeon Dr. Sherlyn Lick [phone]  On 5/18 and after reviewing his MRI he states that he does not recommend aspiration as the infection/osteo is anteriorly and the laminectomy was in the posterior area and he is concerned that doing an aspiration will cause contamination of the surgical site>> he recommends to start patient on empiric antibiotics  and have him transfer to the Hospitalist service at [facility] and he will see him in consultation -blood cultures Ordered on 5/18, continue on 5/18 on empiric Levaquin and vancomycin -I called [place] hospitalists [phone]  on 5/18>> discussed patient with Dr. Reece Levy [phone] accepted him to a telemetry bed>>.  awaiting bed availability Volume depletion/hyponatremia  - hyponatremia resolved with hydration  Hypotension -Infection versus hypovolemia  -Improved with hydration -Blood cultures a pending continue  empiric antibiotics as above  -BP stable today Status post L1-2 laminectomy -12/31 per Dr. Sherlyn Lick History of Diabetes mellitus -No meds listed on his preadmission list - continue modified carb diet, monitoring Accu-Cheks,  A1c 6.5 and covering with SSI follow a History of hypertension Code Status: full Family Communication: None at bed side Disposition Plan: Transfer to [facility] when Tele bed available, spoke with transfer coordinator/nursing supervisor at [facility] today am 5/19-no beds at this time, she will call when a bed  becomes available he   Consultants: Orthopedics/spine surgery- Dr. Sherlyn Lick via phone [phone] on 5/18 >> he plans To see pt in consultation at [facility]  Procedures:  none  Antibiotics:  Levaquin started 5/18  Vancomycin started 5/18  HPI/Subjective: C/o of back pain, he denies leg weakness, today he admits to some tingling in his toes. denies nausea vomiting  Objective: Filed Vitals:   09/18/13 0526  BP: 119/78  Pulse: 85  Temp: 98.2 F (36.8 C)  Resp: 20    Intake/Output Summary (Last 24 hours) at 09/18/13 0924 Last data filed at 09/18/13 0457  Gross per 24 hour  Intake 3213.33 ml  Output   3050 ml  Net 163.33 ml   Filed Weights   09/16/13 1802 09/16/13 2249 09/18/13 0526  Weight: 67.132 kg (148 lb) 63.7 kg (140 lb 6.9 oz) 64.2 kg (141 lb 8.6 oz)    Exam:  General: alert & oriented x 3In NAD Cardiovascular: RRR, nl S1 s2 Respiratory: CTAB Abdomen: soft +BS NT/ND, no masses palpable Extremities: No cyanosis and no edema Neuro: Alert and oriented x3 strength 4-5 out of 5 and symmetric, sensory grossly intact.    Data Reviewed: Basic Metabolic Panel:  Recent Labs Lab 09/16/13 1850 09/17/13 0537 09/18/13 0541  NA 132* 136* 141  K 4.7 4.0 4.2  CL 93* 96 104  CO2 26 27 26   GLUCOSE 120* 103* 99  BUN 23 15 15   CREATININE 0.90 0.80 0.76  CALCIUM 9.7 9.1 8.7   Liver Function Tests:  Recent Labs Lab 09/16/13 1850 09/17/13 0537  AST 21 21  ALT 21 18  ALKPHOS 143* 125*  BILITOT 0.4 0.5  PROT 7.9 7.0  ALBUMIN 2.9* 2.4*    Recent Labs Lab 09/16/13 1850  LIPASE  11   No results found for this basename: AMMONIA,  in the last 168 hours CBC:  Recent Labs Lab 09/16/13 1850 09/17/13 0537 09/18/13 0541  WBC 12.2* 10.8* 8.9  NEUTROABS 9.8*  --   --   HGB 11.1* 10.2* 9.9*  HCT 31.9* 30.0* 29.9*  MCV 90.4 90.4 92.0  PLT 591* 604* 588*   Cardiac Enzymes:  Recent Labs Lab 09/16/13 1850  TROPONINI <0.30   BNP (last 3  results) No results found for this basename: PROBNP,  in the last 8760 hours CBG:  Recent Labs Lab 09/17/13 2150 09/18/13 0802  GLUCAP 110* 108*    No results found for this or any previous visit (from the past 240 hour(s)).   Studies: Dg Chest 2 View  09/16/2013   CLINICAL DATA:  Nausea and vomiting since Tuesday  EXAM: CHEST  2 VIEW  COMPARISON:  June 05, 2009  FINDINGS: The heart size and mediastinal contours are within normal limits. There is no focal infiltrate, pulmonary edema, or pleural effusion. Bilateral symmetrical nipple shadows are noted. Patient status post prior fixation of lower thoracic upper lumbar spine.  IMPRESSION: No active cardiopulmonary disease.   Electronically Signed   By: Abelardo Diesel M.D.   On: 09/16/2013 21:10   Mr Lumbar Spine Wo Contrast  09/17/2013   CLINICAL DATA:  Low back pain with constipation and urinary incontinence. Prior lumbar spine surgery.  EXAM: MRI LUMBAR SPINE WITHOUT CONTRAST  TECHNIQUE: Multiplanar, multisequence MR imaging of the lumbar spine was performed. No intravenous contrast was administered.  COMPARISON:  Lumbar spine MRI 08/22/2013. CT abdomen and pelvis 09/16/2013.  FINDINGS: Trace anterolisthesis of L3 on L4 and L4 on L5 is unchanged. There is grade 1 retrolisthesis of L1 on L2, stable to minimally increased from prior MRI. New from the prior MRI is a mild compression deformity involving the L2 superior endplate with approximately 10% vertebral body height loss. There is abnormal fluid signal within the L1-2 disc space, and there is also new edema throughout the L1 and L2 vertebral bodies. There is paraspinal inflammatory change/ phlegmon at this level, and there is a 1.9 x 1.3 cm fluid collection in the left psoas muscle at the L2 level (series 5, image 16).  Advanced disc space height loss at L5-S1 is unchanged, however there is increased fluid signal within the disc space compared to the prior MRI with mildly increased marrow edema  within the adjacent L5 and S1 vertebral bodies. The conus medullaris is normal in signal and terminates at the superior aspect of L2.  T11-12: Only imaged sagittally. Facet hypertrophy results in mild bilateral neural foraminal narrowing, right greater than left and unchanged.  T12-L1:  Negative.  L1-2: Sequelae of prior laminectomy and posterior fusion are again identified. Posterior retropulsion of the superior L2 endplate, greater on the right, with possibly small adjacent epidural fluid collection, results in new narrowing of the right lateral recess and increased right neural foraminal narrowing. No spinal canal stenosis.  L2-3: Mild disc bulge and facet hypertrophy result in mild right lateral recess and neural foraminal narrowing, unchanged.  L3-4: Mild disc bulge and moderate facet hypertrophy result in mild right neural foraminal narrowing, unchanged. No spinal canal stenosis.  L4-5: Left foraminal disc protrusion and mild-to-moderate facet hypertrophy result in mild to moderate left neural foraminal stenosis, unchanged. No spinal canal stenosis.  L5-S1: Disc bulge and mild facet hypertrophy result in mild to moderate bilateral neural foraminal stenosis, unchanged. No spinal canal stenosis.  IMPRESSION:  1. Changes at L1-2 as above concerning for discitis/osteomyelitis with small left psoas abscess. New, mild compression deformity of the L2 superior endplate and mild retropulsion results in new right lateral recess narrowing and increased right neural foraminal narrowing at L1-2. Small ventral epidural fluid collection is not excluded on the right. 2. Advanced degenerative disc disease at L5-S1 with a small amount of new fluid signal in the disc space and mildly increased adjacent marrow changes. Early discitis/osteomyelitis is not excluded. These results will be called to the ordering clinician or representative by the Radiologist Assistant, and communication documented in the PACS or zVision Dashboard.    Electronically Signed   By: Logan Bores   On: 09/17/2013 09:20   Ct Abdomen Pelvis W Contrast  09/16/2013   CLINICAL DATA:  Nausea and vomiting.  Difficulty emptying bladder.  EXAM: CT ABDOMEN AND PELVIS WITH CONTRAST  TECHNIQUE: Multidetector CT imaging of the abdomen and pelvis was performed using the standard protocol following bolus administration of intravenous contrast.  CONTRAST:  24mL OMNIPAQUE IOHEXOL 300 MG/ML SOLN, 197mL OMNIPAQUE IOHEXOL 300 MG/ML SOLN  COMPARISON:  CT ABD - PELV W/ CM dated 08/24/2013; MR L SPINE W/O dated 08/22/2013  FINDINGS: Wall thickening in the distal esophagus, possible hiatal hernia.  Stable small benign-appearing hypodense lesions in the liver left hepatic lobe, likely cysts.  The spleen, pancreas, and adrenal glands appear unremarkable.  Stable 9 mm hypodense lesion of the right mid kidney posteriorly is technically too small to characterize.  Mild infrarenal abdominal aortic ectasia. 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  Prominent stool throughout the colon favors constipation. Urinary bladder is relatively empty. Appendix surgically absent. If  Prior posterior decompression at the L1-2 level with posterolateral rod and pedicle screw fixation. There is worsened lucency around the left L1 pedicle screw and the right L2 pedicle screw. New compression fracture and demineralization of the superior endplate of L2 with surrounding paraspinal soft tissue density probably reflecting hematoma, less likely infection/phlegmon. There is 4 mm of posterior subluxation at L1-2, previously 3 mm. The spinal canal is difficult to assess at this level due to the streak artifact. There filling defects in the small bowel contrast column probably from solid materials in the fecal stream rather than polyps.  IMPRESSION: 1. New compression fracture along the superior endplate of L2, with abnormal lucency around the left L1 pedicle screw and right L2 pedicle screw which could be due  to loosening or infection. There is new paraspinal soft tissue density in the vicinity of the suspected compression fracture, favoring paraspinal hematoma, with paraspinal infection less likely. 2. Prominent constipation. 3. Wall thickening in the distal esophagus and probable small hiatal hernia. 4. 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   Electronically Signed   By: Sherryl Barters M.D.   On: 09/16/2013 20:51    Scheduled Meds: . aspirin EC  81 mg Oral Daily  . docusate sodium  100 mg Oral BID  . heparin  5,000 Units Subcutaneous 3 times per day  . insulin aspart  0-5 Units Subcutaneous QHS  . insulin aspart  0-9 Units Subcutaneous TID WC  . levofloxacin (LEVAQUIN) IV  750 mg Intravenous Q24H  . naproxen  250 mg Oral BID WC  . pregabalin  75 mg Oral TID  . simvastatin  10 mg Oral q1800  . vancomycin  1,250 mg Intravenous Q12H   Continuous Infusions: . sodium chloride 125 mL/hr at  09/18/13 0735    Active Problems:   Hypotension   HTN (hypertension)   Anxiety   Back pain    Time spent: [facility] Pager [phone]  7PM-7AM, please contact night-coverage at [email or web address] password [facility] 09/18/2013, 9:24 AM  LOS: 2 days

## 2013-09-19 LAB — BASIC METABOLIC PANEL
BUN: 11 mg/dL (ref 6–23)
CHLORIDE: 107 meq/L (ref 96–112)
CO2: 26 meq/L (ref 19–32)
CREATININE: 0.74 mg/dL (ref 0.50–1.35)
Calcium: 8.7 mg/dL (ref 8.4–10.5)
GFR calc Af Amer: >90 mL/min (ref >90)
GFR calc non Af Amer: >90 mL/min (ref >90)
Glucose, Bld: 98 mg/dL (ref 70–99)
Potassium: 4.3 mEq/L (ref 3.7–5.3)
SODIUM: 144 meq/L (ref 137–147)

## 2013-09-19 LAB — GLUCOSE, CAPILLARY
Glucose-Capillary: 90 mg/dL (ref 70–99)
Glucose-Capillary: 119 mg/dL — ABNORMAL HIGH (ref 70–99)

## 2013-09-19 MED ORDER — NAPROXEN 250 MG PO TABS: 250.0000 mg | ORAL_TABLET | Freq: Two times a day (BID) | ORAL | Status: DC

## 2013-09-19 MED ORDER — SODIUM CHLORIDE 0.9 % IV SOLN: 125.0000 mL | INTRAVENOUS | Status: DC

## 2013-09-19 MED ORDER — INSULIN ASPART 100 UNIT/ML ~~LOC~~ SOLN: 0.0000 [IU] | Freq: Every day | SUBCUTANEOUS | Status: DC

## 2013-09-19 MED ORDER — CYCLOBENZAPRINE HCL 10 MG PO TABS: 10.0000 mg | ORAL_TABLET | Freq: Three times a day (TID) | ORAL | Status: DC | PRN

## 2013-09-19 MED ORDER — ONDANSETRON HCL 4 MG PO TABS: 4.0000 mg | ORAL_TABLET | Freq: Four times a day (QID) | ORAL | Status: DC | PRN

## 2013-09-19 MED ORDER — VANCOMYCIN HCL 10 G IV SOLR: 1250.0000 mg | Freq: Two times a day (BID) | INTRAVENOUS | Status: DC

## 2013-09-19 MED ORDER — INSULIN ASPART 100 UNIT/ML ~~LOC~~ SOLN: 0.0000 [IU] | Freq: Three times a day (TID) | SUBCUTANEOUS | Status: DC

## 2013-09-19 MED ORDER — OXYCODONE HCL 5 MG PO TABS: 5.0000 mg | ORAL_TABLET | Freq: Four times a day (QID) | ORAL | Status: DC | PRN

## 2013-09-19 MED ORDER — LEVOFLOXACIN IN D5W 750 MG/150ML IV SOLN: 750.0000 mg | INTRAVENOUS | Status: DC

## 2013-09-19 MED ORDER — DIAZEPAM 10 MG PO TABS: 5.0000 mg | ORAL_TABLET | Freq: Four times a day (QID) | ORAL | Status: DC | PRN

## 2013-09-19 NOTE — Discharge Summary (Signed)
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 Apparently, he had low back surgery at the end of 2014. This was in [facility]. He says that for the last week he has been constipated, has had a poor intake and also has had difficulty in passing urine. When he came to the emergency room, he was actually able to pass urine. It was felt that he was dehydrated and when he was being evaluated in the emergency room, he was found to be hypotensive. He was admitted for further management.   Hospital Course:  L1 to 2 discitis/osteomyelitis and psoas abscess  -Initially discussed patient with ID on 5/18 and Dr. Johnnye Sima recommended disc aspiration IR or Orthopedics here at [facility]>> to hold off antibiotic prior to aspiration, spoke with Dr.[facility] here, he dose not do back surgeries or aspiration.  -I called the patient's orthopedic/Spine surgeon Dr. Sherlyn Lick [phone] On 5/18 and after reviewing his MRI he states that he does not recommend aspiration as the infection/osteo is anteriorly and the laminectomy was in the posterior area and he is concerned that doing an aspiration will cause contamination of the surgical site>> he recommends to start patient on empiric  antibiotics and have him transfer to the Hospitalist service at [facility] and he will see him in consultation  -blood cultures Ordered on 5/18 and to date show no growth, continue on 5/18 on empiric Levaquin and vancomycin  -I called [facility] hospitalists -[phone] on 5/18>> discussed patient with Dr. Reece Levy [phone] accepted him to a telemetry bed>> bed available at [facility] today and he will be transferred to Dr. Reddy's service.  -He remains afebrile and hemodynamically stable at this time. Volume depletion/hyponatremia  - hyponatremia resolved with hydration  Hypotension  -Infection versus hypovolemia  -Resolved with hydration  -Blood cultures obtained following admission show no growth to date. -BP remains stable today  Status post L1-2 laminectomy  -12/31 per Dr. Sherlyn Lick  History of Diabetes mellitus  -No meds listed on his preadmission list  - continue modified carb diet, monitoring Accu-Cheks, A1c 6.5 and covering with SSI follow a  History of hypertension -His been no thrill has been on hold secondary to hypotension as discussed above Consultants:  Orthopedics/spine surgery- Dr. Sherlyn Lick via phone [phone] on 5/18 >> he plans To see pt in consultation at [facility]  Procedures:  none Antibiotics:  Levaquin started 5/18  Vancomycin started 5/18   Discharge Exam: Filed Vitals:   09/19/13 0429  BP: 106/63  Pulse: 87  Temp: 98.1 F (36.7 C)  Resp: 20    Exam:  General: alert & oriented x 3In NAD  Cardiovascular: RRR, nl S1 s2  Respiratory: CTAB  Abdomen: soft +BS NT/ND, no masses palpable  Extremities: No cyanosis  and no edema  Neuro: Alert and oriented x3 strength 4-5 out of 5 and symmetric, sensory grossly intact.  Discharge Instructions You were cared for by a hospitalist during your hospital stay. If you have any questions about your discharge medications or the care you received while you were in the hospital after  you are discharged, you can call the unit and asked to speak with the hospitalist on call if the hospitalist that took care of you is not available. Once you are discharged, your primary care physician will handle any further medical issues. Please note that NO REFILLS for any discharge medications will be authorized once you are discharged, as it is imperative that you return to your primary care physician (or establish a relationship with a primary care physician if you do not have one) for your aftercare needs so that they can reassess your need for medications and monitor your lab values.  Discharge Instructions   Diet Carb Modified    Complete by:  As directed      Increase activity slowly    Complete by:  As directed             Medication List    STOP taking these medications       benazepril 40 MG tablet  Commonly known as:  LOTENSIN     docusate sodium 100 MG capsule  Commonly known as:  COLACE     naproxen sodium 220 MG tablet  Commonly known as:  ANAPROX      TAKE these medications       aspirin EC 81 MG tablet  Take 81 mg by mouth daily.     cyclobenzaprine 10 MG tablet  Commonly known as:  FLEXERIL  Take 1 tablet (10 mg total) by mouth 3 (three) times daily as needed for muscle spasms.     diazepam 10 MG tablet  Commonly known as:  VALIUM  Take 0.5 tablets (5 mg total) by mouth every 6 (six) hours as needed for anxiety (back spasms).     insulin aspart 100 UNIT/ML injection  Commonly known as:  novoLOG  Inject 0-5 Units into the skin at bedtime.     insulin aspart 100 UNIT/ML injection  Commonly known as:  novoLOG  Inject 0-9 Units into the skin 3 (three) times daily with meals.     levofloxacin 750 MG/150ML Soln  Commonly known as:  LEVAQUIN  Inject 150 mLs (750 mg total) into the vein daily.     mineral oil enema  One enema every 12 hours until relief     naproxen 250 MG tablet  Commonly known as:  NAPROSYN  Take 1 tablet (250 mg total) by mouth 2  (two) times daily with a meal.     ondansetron 4 MG tablet  Commonly known as:  ZOFRAN  Take 1 tablet (4 mg total) by mouth every 6 (six) hours as needed for nausea.     oxyCODONE 5 MG immediate release tablet  Commonly known as:  Oxy IR/ROXICODONE  Take 1 tablet (5 mg total) by mouth every 6 (six) hours as needed for severe pain.     pravastatin 20 MG tablet  Commonly known as:  PRAVACHOL  Take 20 mg by mouth every evening.     pregabalin 75 MG capsule  Commonly known as:  LYRICA  Take 75 mg by mouth 3 (three) times daily.     sodium chloride 0.9 % infusion  Inject 125 mLs into the vein continuous.  vancomycin 1,250 mg in sodium chloride 0.9 % 250 mL  Inject 1,250 mg into the vein every 12 (twelve) hours.       Allergies  Allergen Reactions  . Codeine Anaphylaxis, Hives and Swelling  . Penicillins Anaphylaxis, Hives and Swelling  . Latex Itching  . Strawberry Hives      The results of significant diagnostics from this hospitalization (including imaging, microbiology, ancillary and laboratory) are listed below for reference.    Significant Diagnostic Studies: Dg Chest 2 View  09/16/2013   CLINICAL DATA:  Nausea and vomiting since Tuesday  EXAM: CHEST  2 VIEW  COMPARISON:  June 05, 2009  FINDINGS: The heart size and mediastinal contours are within normal limits. There is no focal infiltrate, pulmonary edema, or pleural effusion. Bilateral symmetrical nipple shadows are noted. Patient status post prior fixation of lower thoracic upper lumbar spine.  IMPRESSION: No active cardiopulmonary disease.   Electronically Signed   By: Abelardo Diesel M.D.   On: 09/16/2013 21:10   Mr Lumbar Spine Wo Contrast  09/17/2013   CLINICAL DATA:  Low back pain with constipation and urinary incontinence. Prior lumbar spine surgery.  EXAM: MRI LUMBAR SPINE WITHOUT CONTRAST  TECHNIQUE: Multiplanar, multisequence MR imaging of the lumbar spine was performed. No intravenous contrast was  administered.  COMPARISON:  Lumbar spine MRI 08/22/2013. CT abdomen and pelvis 09/16/2013.  FINDINGS: Trace anterolisthesis of L3 on L4 and L4 on L5 is unchanged. There is grade 1 retrolisthesis of L1 on L2, stable to minimally increased from prior MRI. New from the prior MRI is a mild compression deformity involving the L2 superior endplate with approximately 10% vertebral body height loss. There is abnormal fluid signal within the L1-2 disc space, and there is also new edema throughout the L1 and L2 vertebral bodies. There is paraspinal inflammatory change/ phlegmon at this level, and there is a 1.9 x 1.3 cm fluid collection in the left psoas muscle at the L2 level (series 5, image 16).  Advanced disc space height loss at L5-S1 is unchanged, however there is increased fluid signal within the disc space compared to the prior MRI with mildly increased marrow edema within the adjacent L5 and S1 vertebral bodies. The conus medullaris is normal in signal and terminates at the superior aspect of L2.  T11-12: Only imaged sagittally. Facet hypertrophy results in mild bilateral neural foraminal narrowing, right greater than left and unchanged.  T12-L1:  Negative.  L1-2: Sequelae of prior laminectomy and posterior fusion are again identified. Posterior retropulsion of the superior L2 endplate, greater on the right, with possibly small adjacent epidural fluid collection, results in new narrowing of the right lateral recess and increased right neural foraminal narrowing. No spinal canal stenosis.  L2-3: Mild disc bulge and facet hypertrophy result in mild right lateral recess and neural foraminal narrowing, unchanged.  L3-4: Mild disc bulge and moderate facet hypertrophy result in mild right neural foraminal narrowing, unchanged. No spinal canal stenosis.  L4-5: Left foraminal disc protrusion and mild-to-moderate facet hypertrophy result in mild to moderate left neural foraminal stenosis, unchanged. No spinal canal stenosis.   L5-S1: Disc bulge and mild facet hypertrophy result in mild to moderate bilateral neural foraminal stenosis, unchanged. No spinal canal stenosis.  IMPRESSION: 1. Changes at L1-2 as above concerning for discitis/osteomyelitis with small left psoas abscess. New, mild compression deformity of the L2 superior endplate and mild retropulsion results in new right lateral recess narrowing and increased right neural foraminal narrowing at L1-2. Small ventral  epidural fluid collection is not excluded on the right. 2. Advanced degenerative disc disease at L5-S1 with a small amount of new fluid signal in the disc space and mildly increased adjacent marrow changes. Early discitis/osteomyelitis is not excluded. These results will be called to the ordering clinician or representative by the Radiologist Assistant, and communication documented in the PACS or zVision Dashboard.   Electronically Signed   By: Logan Bores   On: 09/17/2013 09:20   Mr Lumbar Spine Wo Contrast  08/22/2013   CLINICAL DATA:  Severe low back pain since surgery 05/02/2013. Bilateral leg pain and weakness.  EXAM: MRI LUMBAR SPINE WITHOUT CONTRAST  TECHNIQUE: Multiplanar, multisequence MR imaging was performed. No intravenous contrast was administered.  COMPARISON:  Lumbar spine radiographs 05/04/2013. Preoperative MRI of the lumbar spine 04/27/2013.  FINDINGS: Normal signal is present in the conus medullaris which terminates at L1-2. Patient is status post PLIF at L1-2. Slight anterolisthesis at L3-4 and L4-5 is stable. Limited imaging of the abdomen is unremarkable.  Facet hypertrophy at T11-12 results and mild foraminal narrowing bilaterally, right greater than left.  T12-L1:  Negative.  L1-2: The patient is status post laminectomy. Metallic artifact somewhat obscures the level. The posterior impression on the central canal has been removed. The central canal is widely patent. Mild residual foraminal narrowing is present on the right.  L2-3: A broad-based  disc herniation and asymmetric right-sided facet hypertrophy are stable. Mild right lateral recess and foraminal stenosis are again seen.  L3-4: Mild disc bulging and moderate facet hypertrophy is present. Mild right foraminal narrowing is stable.  L4-5: A far left lateral disc protrusion and annular tear is noted. Mild facet hypertrophy is present. This results in mild left foraminal narrowing without change.  L5-S1: Mild disc bulging and facet hypertrophy is present. There is no significant stenosis or change.  IMPRESSION: 1. Status post PLIF at L1-2 with decompression of the spinal canal. 2. Residual mild right foraminal narrowing at L1-2. 3. Broad-based disc herniation and asymmetric right-sided facet hypertrophy at L2-3 a lead to mild stable right lateral recess and foraminal narrowing. 4. Mild right foraminal narrowing at L3-4. 5. Stable left foraminal narrowing at L4-5.   Electronically Signed   By: Lawrence Santiago M.D.   On: 08/22/2013 10:32   Ct Abdomen Pelvis W Contrast  09/16/2013   CLINICAL DATA:  Nausea and vomiting.  Difficulty emptying bladder.  EXAM: CT ABDOMEN AND PELVIS WITH CONTRAST  TECHNIQUE: Multidetector CT imaging of the abdomen and pelvis was performed using the standard protocol following bolus administration of intravenous contrast.  CONTRAST:  65mL OMNIPAQUE IOHEXOL 300 MG/ML SOLN, 144mL OMNIPAQUE IOHEXOL 300 MG/ML SOLN  COMPARISON:  CT ABD - PELV W/ CM dated 08/24/2013; MR L SPINE W/O dated 08/22/2013  FINDINGS: Wall thickening in the distal esophagus, possible hiatal hernia.  Stable small benign-appearing hypodense lesions in the liver left hepatic lobe, likely cysts.  The spleen, pancreas, and adrenal glands appear unremarkable.  Stable 9 mm hypodense lesion of the right mid kidney posteriorly is technically too small to characterize.  Mild infrarenal abdominal aortic ectasia. 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  Prominent stool throughout the colon favors constipation.  Urinary bladder is relatively empty. Appendix surgically absent. If  Prior posterior decompression at the L1-2 level with posterolateral rod and pedicle screw fixation. There is worsened lucency around the left L1 pedicle screw and the right L2 pedicle screw. New compression fracture and demineralization of the superior endplate of  L2 with surrounding paraspinal soft tissue density probably reflecting hematoma, less likely infection/phlegmon. There is 4 mm of posterior subluxation at L1-2, previously 3 mm. The spinal canal is difficult to assess at this level due to the streak artifact. There filling defects in the small bowel contrast column probably from solid materials in the fecal stream rather than polyps.  IMPRESSION: 1. New compression fracture along the superior endplate of L2, with abnormal lucency around the left L1 pedicle screw and right L2 pedicle screw which could be due to loosening or infection. There is new paraspinal soft tissue density in the vicinity of the suspected compression fracture, favoring paraspinal hematoma, with paraspinal infection less likely. 2. Prominent constipation. 3. Wall thickening in the distal esophagus and probable small hiatal hernia. 4. 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   Electronically Signed   By: Sherryl Barters M.D.   On: 09/16/2013 20:51   Ct Abdomen Pelvis W Contrast  08/24/2013   CLINICAL DATA:  Right side abdominal pain, change in bowel habit  EXAM: CT ABDOMEN AND PELVIS WITH CONTRAST  TECHNIQUE: Multidetector CT imaging of the abdomen and pelvis was performed using the standard protocol following bolus administration of intravenous contrast.  CONTRAST:  85mL OMNIPAQUE IOHEXOL 300 MG/ML SOLN, 165mL OMNIPAQUE IOHEXOL 300 MG/ML SOLN  COMPARISON:  04/06/2013  FINDINGS: Sagittal images of the spine shows significant disc space flattening with endplate sclerotic changes vacuum disc phenomenon  anterior and posterior spurring at L5-S1 level. Mild disc space flattening with mild posterior disc bulge at L4-L5 level. Posterior metallic fixation material noted L1-L2 level.  Lung bases are unremarkable. There is a hiatal hernia measures 3 cm.  Enhanced liver shows no biliary ductal dilatation. Tiny hepatic cysts are noted. No calcified gallstones are noted within gallbladder. CBD measures 1 cm in diameter.  No aortic aneurysm. The pancreas, spleen and adrenal glands are unremarkable. Significant colonic distention especially right colon and transverse colon with abundant stool. There is some focal narrowing of the colonic lumen in axial image 37. There is some similar narrowing of colonic lumen in right colon axial image 60. Findings may be due to multifocal colonic spasm. Follow-up colonoscopy is recommended to exclude a neoplastic process. No definite colonic mass is identified.  Moderate stool noted in sigmoid colon and descending colon.  No small bowel obstruction. No ascites or free air. No adenopathy. Kidneys are symmetrical in size and enhancement. No hydronephrosis or hydroureter. There is a cyst in midpole of the right kidney measures 8 mm. Delayed renal images shows bilateral renal symmetrical excretion. Bilateral visualized proximal ureter is unremarkable.  A distended urinary bladder is noted. Small right inguinal carinal hernia containing fat without evidence of acute complication.  IMPRESSION: 1. There is moderate distension of the right colon with abundant stool. Significant distension transverse colon with stool. There are at least 2 focal areas of narrowing of the lumen suspicious for colonic spasm rather than lesion. Follow-up nonemergent colonoscopy is recommended. 2. No small bowel obstruction. 3. No hydronephrosis or hydroureter. 4. Degenerative changes lumbar spine. 5. Mild CBD dilatation up to 1 cm.   Electronically Signed   By: Lahoma Crocker M.D.   On: 08/24/2013 11:58     Microbiology: Recent Results (from the past 240 hour(s))  URINE CULTURE     Status: None   Collection Time    09/16/13  8:01 PM      Result Value Ref Range Status  Specimen Description URINE, CLEAN CATCH   Final   Special Requests NONE   Final   Culture  Setup Time     Final   Value: 09/17/2013 15:18     Performed at [facility] Count     Final   Value: NO GROWTH     Performed at [facility]   Culture     Final   Value: NO GROWTH     Performed at [facility]   Report Status 09/18/2013 FINAL   Final  CULTURE, BLOOD (ROUTINE X 2)     Status: None   Collection Time    09/17/13  6:34 PM      Result Value Ref Range Status   Specimen Description BLOOD LEFT BRACHIAL ARTERY   Final   Special Requests BOTTLES DRAWN AEROBIC AND ANAEROBIC 12CC   Final   Culture NO GROWTH 2 DAYS   Final   Report Status PENDING   Incomplete  CULTURE, BLOOD (ROUTINE X 2)     Status: None   Collection Time    09/17/13  6:45 PM      Result Value Ref Range Status   Specimen Description BLOOD LEFT ARM   Final   Special Requests BOTTLES DRAWN AEROBIC AND ANAEROBIC 10CC   Final   Culture NO GROWTH 2 DAYS   Final   Report Status PENDING   Incomplete     Labs: Basic Metabolic Panel:  Recent Labs Lab 09/16/13 1850 09/17/13 0537 09/18/13 0541 09/19/13 0541  NA 132* 136* 141 144  K 4.7 4.0 4.2 4.3  CL 93* 96 104 107  CO2 26 27 26 26   GLUCOSE 120* 103* 99 98  BUN 23 15 15 11   CREATININE 0.90 0.80 0.76 0.74  CALCIUM 9.7 9.1 8.7 8.7   Liver Function Tests:  Recent Labs Lab 09/16/13 1850 09/17/13 0537  AST 21 21  ALT 21 18  ALKPHOS 143* 125*  BILITOT 0.4 0.5  PROT 7.9 7.0  ALBUMIN 2.9* 2.4*    Recent Labs Lab 09/16/13 1850  LIPASE 11   No results found for this basename: AMMONIA,  in the last 168 hours CBC:  Recent Labs Lab 09/16/13 1850 09/17/13 0537 09/18/13 0541  WBC 12.2* 10.8* 8.9  NEUTROABS 9.8*  --   --   HGB 11.1* 10.2* 9.9*   HCT 31.9* 30.0* 29.9*  MCV 90.4 90.4 92.0  PLT 591* 604* 588*   Cardiac Enzymes:  Recent Labs Lab 09/16/13 1850  TROPONINI <0.30   BNP: BNP (last 3 results) No results found for this basename: PROBNP,  in the last 8760 hours CBG:  Recent Labs Lab 09/18/13 1112 09/18/13 1639 09/18/13 1807 09/18/13 2046 09/19/13 0742  GLUCAP 167* 123* 146* 133* 90       Signed:  Takyra Cantrall C Katrese Shell  [facility] 09/19/2013, 10:20 AM

## 2013-09-19 NOTE — Progress Notes (Signed)
Pt being d/c to [facility] room 721, report called to [place], [place]. Pt in stable condition at time of transport, and his belongings were sent with him. Pt was transported via [facility], report was also given to [facility] staff. Marc Rose

## 2013-09-22 LAB — CULTURE, BLOOD (ROUTINE X 2)
CULTURE: NO GROWTH
Culture: NO GROWTH

## 2014-07-04 ENCOUNTER — Telehealth (HOSPITAL_COMMUNITY): Payer: Self-pay | Admitting: Cardiovascular Disease

## 2014-07-04 NOTE — Telephone Encounter (Signed)
Received records from [facility] @ [place] for appointment with Dr Gwenlyn Found on 07/05/14.  Records given to [facility] (medical records) for Dr Kennon Holter schedule on 07/05/14.  lp

## 2014-07-05 ENCOUNTER — Ambulatory Visit (INDEPENDENT_AMBULATORY_CARE_PROVIDER_SITE_OTHER): Payer: Medicare Other | Admitting: Cardiovascular Disease

## 2014-07-05 ENCOUNTER — Encounter: Payer: Self-pay | Admitting: Cardiovascular Disease

## 2014-07-05 VITALS — BP 118/82 | HR 66 | Ht 74.0 in | Wt 169.4 lb

## 2014-07-05 DIAGNOSIS — E785 Hyperlipidemia, unspecified: Secondary | ICD-10-CM | POA: Insufficient documentation

## 2014-07-05 DIAGNOSIS — I739 Peripheral vascular disease, unspecified: Secondary | ICD-10-CM

## 2014-07-05 DIAGNOSIS — I10 Essential (primary) hypertension: Secondary | ICD-10-CM

## 2014-07-05 NOTE — Progress Notes (Signed)
07/05/2014 Marc Rose   [birth year]  [id number]  Primary Physician Stephens Shire, MD Primary Cardiologist: Lorretta Harp MD Renae Gloss   HPI:  Marc Rose is a 64 year old thin appearing widowed Caucasian male father of 2 stepchildren referred by Dr. Tollie Pizza at [facility] for evaluation of peripheral arterial disease. His cardiovascular risk factor profile is notable for 20-pack-years of tobacco abuse, smoking a few cigarettes a day. This was factor profile otherwise is benign. He has been disabled since 2006 when he had a work-related accident to his legs. He's had multiple back and spine operations beginning 05/02/13 and currently wears a brace. There is no family history of heart disease. He denies chest pain or shortness of breath. He does complain of lower extremity coolness but denies claudication.   Current Outpatient Prescriptions  Medication Sig Dispense Refill  . aspirin EC 81 MG tablet Take 81 mg by mouth daily.    . cyclobenzaprine (FLEXERIL) 10 MG tablet Take 1 tablet (10 mg total) by mouth 3 (three) times daily as needed for muscle spasms. 30 tablet 0  . diazepam (VALIUM) 10 MG tablet Take 0.5 tablets (5 mg total) by mouth every 6 (six) hours as needed for anxiety (back spasms). 30 tablet 0  . fluticasone (FLONASE) 50 MCG/ACT nasal spray Place into the nose.    . neomycin-polymyxin-hydrocortisone (CORTISPORIN) otic solution Place in ear(s).    . pravastatin (PRAVACHOL) 20 MG tablet Take 20 mg by mouth every evening.     No current facility-administered medications for this visit.    Allergies  Allergen Reactions  . Codeine Anaphylaxis, Hives and Swelling  . Penicillins Anaphylaxis, Hives and Swelling  . Latex Itching  . Strawberry Hives    History   Social History  . Marital Status: Widowed    Spouse Name: N/A  . Number of Children: N/A  . Years of Education: N/A   Occupational History  . Not on file.   Social History  Main Topics  . Smoking status: Current Every Day Smoker -- 0.50 packs/day for 39 years    Types: Cigarettes  . Smokeless tobacco: Never Used  . Alcohol Use: No     Comment: occasionally  . Drug Use: Yes    Special: Marijuana  . Sexual Activity: Not on file   Other Topics Concern  . Not on file   Social History Narrative     Review of Systems: General: negative for chills, fever, night sweats or weight changes.  Cardiovascular: negative for chest pain, dyspnea on exertion, edema, orthopnea, palpitations, paroxysmal nocturnal dyspnea or shortness of breath Dermatological: negative for rash Respiratory: negative for cough or wheezing Urologic: negative for hematuria Abdominal: negative for nausea, vomiting, diarrhea, bright red blood per rectum, melena, or hematemesis Neurologic: negative for visual changes, syncope, or dizziness All other systems reviewed and are otherwise negative except as noted above.    Blood pressure 118/82, pulse 66, height 6\' 2"  (1.88 m), weight 169 lb 6.4 oz (76.839 kg).  General appearance: alert and no distress Neck: no adenopathy, no carotid bruit, no JVD, supple, symmetrical, trachea midline and thyroid not enlarged, symmetric, no tenderness/mass/nodules Lungs: clear to auscultation bilaterally Heart: regular rate and rhythm, S1, S2 normal, no murmur, click, rub or gallop Extremities: extremities normal, atraumatic, no cyanosis or edema  EKG not performed today  ASSESSMENT AND PLAN:   HTN (hypertension) History of hypertension with blood pressure measured at 118/82. He is not on any antihypertensive medications currently  Hyperlipidemia History of hyperlipidemia on pravastatin 20 mg a day followed by his PCP   Peripheral arterial disease The patient was reversed referred for evaluation of PAD. He denies claudication but says his feet feel cold at night. He has had multiple back operations. He has palpable pedal pulses. I'm going to obtain  lower extremity arterial Doppler studies at these are abnormal we'll see him back in follow-up otherwise when necessary       Lorretta Harp MD [facility], [facility] 07/05/2014 10:06 AM

## 2014-07-05 NOTE — Patient Instructions (Signed)
Lower extremity arterial doppler- During this test, ultrasound is used to evaluate arterial blood flow in the legs. Allow approximately one hour for this exam.   Please follow up with Dr. Gwenlyn Found as needed.

## 2014-07-05 NOTE — Assessment & Plan Note (Signed)
The patient was reversed referred for evaluation of PAD. He denies claudication but says his feet feel cold at night. He has had multiple back operations. He has palpable pedal pulses. I'm going to obtain lower extremity arterial Doppler studies at these are abnormal we'll see him back in follow-up otherwise when necessary

## 2014-07-05 NOTE — Assessment & Plan Note (Signed)
History of hyperlipidemia on pravastatin 20 mg a day followed by his PCP

## 2014-07-05 NOTE — Assessment & Plan Note (Signed)
History of hypertension with blood pressure measured at 118/82. He is not on any antihypertensive medications currently

## 2014-07-11 ENCOUNTER — Inpatient Hospital Stay (HOSPITAL_COMMUNITY): Admission: RE | Admit: 2014-07-11 | Payer: Medicare Other | Source: Ambulatory Visit

## 2014-07-12 ENCOUNTER — Ambulatory Visit (HOSPITAL_COMMUNITY)
Admission: RE | Admit: 2014-07-12 | Discharge: 2014-07-12 | Disposition: A | Discharge status: Home / Self Care | Payer: Medicare Other | Source: Ambulatory Visit | Attending: Internal Medicine | Admitting: Internal Medicine

## 2014-07-12 DIAGNOSIS — I739 Peripheral vascular disease, unspecified: Secondary | ICD-10-CM

## 2014-07-12 NOTE — Progress Notes (Signed)
Lower Extremity Arterial Duplex Completed. °Brianna L Mazza,RVT °

## 2014-08-13 ENCOUNTER — Ambulatory Visit: Payer: Self-pay | Admitting: Cardiovascular Disease

## 2015-02-05 ENCOUNTER — Encounter: Payer: Self-pay | Admitting: Infectious Disease

## 2015-02-05 ENCOUNTER — Ambulatory Visit (INDEPENDENT_AMBULATORY_CARE_PROVIDER_SITE_OTHER): Payer: Medicare Other | Admitting: Infectious Disease

## 2015-02-05 VITALS — BP 136/91 | HR 96 | Temp 98.6°F | Ht 74.0 in | Wt 179.0 lb

## 2015-02-05 DIAGNOSIS — M545 Low back pain, unspecified: Secondary | ICD-10-CM | POA: Insufficient documentation

## 2015-02-05 DIAGNOSIS — K6812 Psoas muscle abscess: Secondary | ICD-10-CM | POA: Insufficient documentation

## 2015-02-05 DIAGNOSIS — Z23 Encounter for immunization: Secondary | ICD-10-CM | POA: Insufficient documentation

## 2015-02-05 DIAGNOSIS — M4646 Discitis, unspecified, lumbar region: Secondary | ICD-10-CM | POA: Diagnosis not present

## 2015-02-05 DIAGNOSIS — T847XXA Infection and inflammatory reaction due to other internal orthopedic prosthetic devices, implants and grafts, initial encounter: Secondary | ICD-10-CM

## 2015-02-05 DIAGNOSIS — R1084 Generalized abdominal pain: Secondary | ICD-10-CM | POA: Insufficient documentation

## 2015-02-05 DIAGNOSIS — K5900 Constipation, unspecified: Secondary | ICD-10-CM | POA: Insufficient documentation

## 2015-02-05 DIAGNOSIS — H9319 Tinnitus, unspecified ear: Secondary | ICD-10-CM | POA: Insufficient documentation

## 2015-02-05 DIAGNOSIS — M4800 Spinal stenosis, site unspecified: Secondary | ICD-10-CM | POA: Insufficient documentation

## 2015-02-05 DIAGNOSIS — J069 Acute upper respiratory infection, unspecified: Secondary | ICD-10-CM | POA: Insufficient documentation

## 2015-02-05 DIAGNOSIS — M792 Neuralgia and neuritis, unspecified: Secondary | ICD-10-CM | POA: Insufficient documentation

## 2015-02-05 DIAGNOSIS — G629 Polyneuropathy, unspecified: Secondary | ICD-10-CM | POA: Insufficient documentation

## 2015-02-05 DIAGNOSIS — Z792 Long term (current) use of antibiotics: Secondary | ICD-10-CM | POA: Insufficient documentation

## 2015-02-05 DIAGNOSIS — E639 Nutritional deficiency, unspecified: Secondary | ICD-10-CM | POA: Insufficient documentation

## 2015-02-05 DIAGNOSIS — M4640 Discitis, unspecified, site unspecified: Secondary | ICD-10-CM

## 2015-02-05 DIAGNOSIS — R799 Abnormal finding of blood chemistry, unspecified: Secondary | ICD-10-CM | POA: Insufficient documentation

## 2015-02-05 DIAGNOSIS — I10 Essential (primary) hypertension: Secondary | ICD-10-CM | POA: Insufficient documentation

## 2015-02-05 DIAGNOSIS — M549 Dorsalgia, unspecified: Secondary | ICD-10-CM

## 2015-02-05 DIAGNOSIS — Z91199 Patient's noncompliance with other medical treatment and regimen due to unspecified reason: Secondary | ICD-10-CM | POA: Insufficient documentation

## 2015-02-05 DIAGNOSIS — G8929 Other chronic pain: Secondary | ICD-10-CM

## 2015-02-05 DIAGNOSIS — R600 Localized edema: Secondary | ICD-10-CM

## 2015-02-05 DIAGNOSIS — L259 Unspecified contact dermatitis, unspecified cause: Secondary | ICD-10-CM | POA: Insufficient documentation

## 2015-02-05 DIAGNOSIS — R739 Hyperglycemia, unspecified: Secondary | ICD-10-CM | POA: Insufficient documentation

## 2015-02-05 DIAGNOSIS — R339 Retention of urine, unspecified: Secondary | ICD-10-CM

## 2015-02-05 DIAGNOSIS — E7800 Pure hypercholesterolemia, unspecified: Secondary | ICD-10-CM | POA: Insufficient documentation

## 2015-02-05 DIAGNOSIS — G5640 Causalgia of unspecified upper limb: Secondary | ICD-10-CM | POA: Insufficient documentation

## 2015-02-05 DIAGNOSIS — R110 Nausea: Secondary | ICD-10-CM | POA: Insufficient documentation

## 2015-02-05 DIAGNOSIS — F17200 Nicotine dependence, unspecified, uncomplicated: Secondary | ICD-10-CM | POA: Insufficient documentation

## 2015-02-05 DIAGNOSIS — Z9119 Patient's noncompliance with other medical treatment and regimen: Secondary | ICD-10-CM | POA: Insufficient documentation

## 2015-02-05 DIAGNOSIS — R634 Abnormal weight loss: Secondary | ICD-10-CM | POA: Insufficient documentation

## 2015-02-05 DIAGNOSIS — I739 Peripheral vascular disease, unspecified: Secondary | ICD-10-CM | POA: Insufficient documentation

## 2015-02-05 DIAGNOSIS — R1031 Right lower quadrant pain: Secondary | ICD-10-CM | POA: Insufficient documentation

## 2015-02-05 HISTORY — DX: Discitis, unspecified, site unspecified: M46.40

## 2015-02-05 HISTORY — DX: Infection and inflammatory reaction due to other internal orthopedic prosthetic devices, implants and grafts, initial encounter: T84.7XXA

## 2015-02-05 LAB — COMPLETE METABOLIC PANEL WITH GFR
ALBUMIN: 4.1 g/dL (ref 3.6–5.1)
ALT: 12 U/L (ref 9–46)
AST: 17 U/L (ref 10–35)
Alkaline Phosphatase: 79 U/L (ref 40–115)
BUN: 17 mg/dL (ref 7–25)
CALCIUM: 9 mg/dL (ref 8.6–10.3)
CO2: 30 mmol/L (ref 20–31)
CREATININE: 1.07 mg/dL (ref 0.70–1.25)
Chloride: 106 mmol/L (ref 98–110)
GFR, Est African American: 84 mL/min (ref >=60)
GFR, Est Non African American: 73 mL/min (ref >=60)
Glucose, Bld: 87 mg/dL (ref 65–99)
Potassium: 4.7 mmol/L (ref 3.5–5.3)
Sodium: 143 mmol/L (ref 135–146)
Total Bilirubin: 0.3 mg/dL (ref 0.2–1.2)
Total Protein: 6 g/dL — ABNORMAL LOW (ref 6.1–8.1)

## 2015-02-05 LAB — CBC WITH DIFFERENTIAL/PLATELET
Basophils Absolute: 0.1 10*3/uL (ref 0.0–0.1)
Basophils Relative: 1 % (ref 0–1)
EOS PCT: 2 % (ref 0–5)
Eosinophils Absolute: 0.1 10*3/uL (ref 0.0–0.7)
HEMATOCRIT: 38.2 % — AB (ref 39.0–52.0)
Hemoglobin: 13.3 g/dL (ref 13.0–17.0)
Lymphocytes Relative: 24 % (ref 12–46)
Lymphs Abs: 1.3 10*3/uL (ref 0.7–4.0)
MCH: 32.5 pg (ref 26.0–34.0)
MCHC: 34.8 g/dL (ref 30.0–36.0)
MCV: 93.4 fL (ref 78.0–100.0)
MONO ABS: 0.6 10*3/uL (ref 0.1–1.0)
MPV: 9.3 fL (ref 8.6–12.4)
Monocytes Relative: 10 % (ref 3–12)
Neutro Abs: 3.5 10*3/uL (ref 1.7–7.7)
Neutrophils Relative %: 63 % (ref 43–77)
Platelets: 220 10*3/uL (ref 150–400)
RBC: 4.09 MIL/uL — AB (ref 4.22–5.81)
RDW: 15 % (ref 11.5–15.5)
WBC: 5.5 10*3/uL (ref 4.0–10.5)

## 2015-02-05 LAB — C-REACTIVE PROTEIN: CRP: <0.5 mg/dL (ref <0.60)

## 2015-02-05 MED ORDER — MINOCYCLINE HCL 100 MG PO TABS: 100.0000 mg | ORAL_TABLET | Freq: Two times a day (BID) | ORAL | Status: DC

## 2015-02-05 NOTE — Progress Notes (Signed)
Reason for consult: diskitis associated with hardware  Requesting physician: Juanita Craver Subjective:    Patient ID: Marc Rose, male    DOB: 22-Mar-1951, 64 y.o.   MRN: [id number]  HPI  64 year old man with history of DM, multiple spinal surgeries who was admitted to [facility] on May 20th, 2015. At that time he was found to have abnormal fluid in L1-L2 concerning for osteomyelitis and left psoas muscle abscess. The abscess was felt not amenable to IR guided aspiration and he had already been on IV antibiotics so yield of cultures was felt to be low and it was not  Pursued and he was treated with I Vancomycin and oral levaquin.  Then on June 29th, 2015 he was again admitted. At that time he was found to have a pathological fracture in the L1-L2 area. He was taken to the OR and underwent T10-L4 posterior spinal fusion and instrumentation as well as removal of hardware at L1 and L2 area and bone grafting. Afterwards on July 6th, 2015 he had L1, L2 corpectomy, placement of pyarmesh cage in corpectomy defect, anterior plating from T12-L3 with Medtronic vantage plate and local rib graft. Multiple surgical cultures were negative including AFB, fungal and bacterial cultures (though I do not know if he had antecedent antibiotics--that is unfortunately typical including for our own hospital), He was initially placed on IV daptomycin and cefepime in the hospital. However at time of DC this was too costly a regimen and he was changed to IV vancomycin and oral ciprofloxacin. He then left AMA but ID at [facility],--Dr. Garnette Scheuermann, was able to start him on his IV vanco and po cipro.   He finished an 8 week course of IV vancomycin and oral cipro on 01/19/14 and was then changed to oral minocycline and cipro. The patient tells me that he felt the best when he was getting the IV abx. He was followed very closely by Dr. Garnette Scheuermann who monitored response to abx and followed ESR, CRP. Dr Garnette Scheuermann changed the patient to oral minocycline  alone this June of 2016.   The patient apparently has been frustrated with long drive to [facility] in [place] from where he currently lives. He is also frustrated that "they cant figure out what kind of infection I have after 2 years and somehow expects blood work to be something that we could use to pin down a specific infection. "   He has been referred to [facility] and seen by a physician there though I am not familiar with her name. He was told that he should never have spinal surgery again--which is not surprising.   Pt is worried about the idea of longterm potentially lifelong abx but I told him there are some people for whom this is needed and that he will need protracted if not lifelong suppressive abx.   Past Medical History  Diagnosis Date  . Depression   . Impaired fasting glucose   . Hypertension   . Benign neoplasm of colon   . Unspecified retinal detachment   . Tobacco use disorder   . Anxiety state, unspecified   . Essential hypertension, benign   . Elevated blood pressure reading without diagnosis of hypertension   . Causalgia of upper limb     Left limb  . Pure hypercholesterolemia   . Diabetes mellitus   . Arthritis   . Chronic leg pain   . Chronic back pain   . Peripheral arterial disease (HCC)     Past  Surgical History  Procedure Laterality Date  . Ankle surgery      left  . Knee surgery      left  . Appendectomy    . Colonoscopy    . Back surgery      Family History  Problem Relation Age of Onset  . COPD Mother   . Hyperthyroidism Mother   . Hyperthyroidism Sister       Social History   Social History  . Marital Status: Widowed    Spouse Name: N/A  . Number of Children: N/A  . Years of Education: N/A   Social History Main Topics  . Smoking status: Current Every Day Smoker -- 0.50 packs/day for 39 years    Types: Cigarettes  . Smokeless tobacco: Never Used  . Alcohol Use: No     Comment: occasionally  . Drug Use: Yes     Special: Marijuana  . Sexual Activity: Not Asked   Other Topics Concern  . None   Social History Narrative    Allergies  Allergen Reactions  . Codeine Anaphylaxis, Hives and Swelling  . Penicillins Anaphylaxis, Hives and Swelling  . Latex Itching  . Strawberry Hives     Current outpatient prescriptions:  .  aspirin EC 81 MG tablet, Take 81 mg by mouth daily., Disp: , Rfl:  .  cyclobenzaprine (FLEXERIL) 10 MG tablet, Take 1 tablet (10 mg total) by mouth 3 (three) times daily as needed for muscle spasms., Disp: 30 tablet, Rfl: 0 .  cyclobenzaprine (FLEXERIL) 10 MG tablet, Take by mouth., Disp: , Rfl:  .  diazepam (VALIUM) 10 MG tablet, Take 0.5 tablets (5 mg total) by mouth every 6 (six) hours as needed for anxiety (back spasms)., Disp: 30 tablet, Rfl: 0 .  fluticasone (FLONASE) 50 MCG/ACT nasal spray, Place into the nose., Disp: , Rfl:  .  fluticasone (FLONASE) 50 MCG/ACT nasal spray, Place into the nose., Disp: , Rfl:  .  minocycline (DYNACIN) 100 MG tablet, Take 1 tablet (100 mg total) by mouth 2 (two) times daily., Disp: 60 tablet, Rfl: 11 .  neomycin-polymyxin-hydrocortisone (CORTISPORIN) otic solution, Place in ear(s)., Disp: , Rfl:  .  pravastatin (PRAVACHOL) 20 MG tablet, Take 20 mg by mouth every evening., Disp: , Rfl:     Review of Systems  Constitutional: Positive for fatigue. Negative for fever, chills, diaphoresis, activity change, appetite change and unexpected weight change.  HENT: Negative for congestion, rhinorrhea, sinus pressure, sneezing, sore throat and trouble swallowing.   Eyes: Negative for photophobia and visual disturbance.  Respiratory: Negative for cough, chest tightness, shortness of breath, wheezing and stridor.   Cardiovascular: Positive for leg swelling. Negative for chest pain and palpitations.  Gastrointestinal: Negative for nausea, vomiting, abdominal pain, diarrhea, constipation, blood in stool, abdominal distention and anal bleeding.    Genitourinary: Negative for dysuria, hematuria, flank pain and difficulty urinating.  Musculoskeletal: Positive for myalgias, back pain, joint swelling and arthralgias. Negative for gait problem.  Skin: Negative for color change, pallor, rash and wound.  Neurological: Negative for dizziness, tremors, weakness and light-headedness.  Hematological: Negative for adenopathy. Does not bruise/bleed easily.  Psychiatric/Behavioral: Negative for behavioral problems, confusion, sleep disturbance, dysphoric mood, decreased concentration and agitation.       Objective:   Physical Exam  Constitutional: He is oriented to person, place, and time. He appears well-developed and well-nourished.  HENT:  Head: Normocephalic and atraumatic.  Eyes: Conjunctivae and EOM are normal.  Neck: Normal range of motion. Neck supple.  Cardiovascular:  Normal rate and regular rhythm.   Pulmonary/Chest: Effort normal. No respiratory distress. He has no wheezes.  Abdominal: Soft. He exhibits no distension.  Musculoskeletal: Normal range of motion. He exhibits edema. He exhibits no tenderness.       Arms: Neurological: He is alert and oriented to person, place, and time.  Skin: Skin is warm and dry. No rash noted. No erythema. No pallor.  Psychiatric: He exhibits a depressed mood.          Assessment & Plan:   Hardware associated diskitis, vertebral osteomyelitis, psoas abscess:  I agree with Dr. Laney Pastor choice of minocycline given that many features of the patient's story would point to MSSA, MRSA or Coag neg staph as likely culprit. I explained exhaustively to the pt that the only way to pin point a pathogen would be to take him off abx for several weeks to [facility], find a target in his back and obtain a deep specimen for culture--which would likely require surgery unless there was an easy target for IR and that even then we might not isolate a pathogen. Since repeat surgery is not something that is in the works  I would continue him on oral minocycline. He feels no worse on this than when on combined therapy with cipro  I will check labs today and he can followup in 2-3 months time  If he has significant worsening we can get a repeat CT with contrast of his L spine to re-asess this area.   I would not launch into repeat IV abx in this patient without a very good reason. I am somewhat suspicious of the fact that he stated that he "felt better on iV" abs though this is possible. That fact along with his having left AMA would make me concerned that he might be someone who would use IVD. I do not see opiates on his profile though he certainly perseverates about how much pain he is in   Lower extremity edema: he states that this is due to his lyrica but that his PCP and pain management MDs want him taking it. I will defer to them  I spent greater than 60 minutes with the patient including greater than 50% of time in face to face counsel of the patient re his hardware associate diskitis, chronic pain, edema and in coordination of their care.

## 2015-02-06 LAB — SEDIMENTATION RATE: Sed Rate: 4 mm/hr (ref 0–20)

## 2015-02-26 ENCOUNTER — Telehealth: Payer: Self-pay | Admitting: *Deleted

## 2015-02-26 NOTE — Telephone Encounter (Signed)
Excellent

## 2015-02-26 NOTE — Telephone Encounter (Signed)
Patient called back, RN relayed message. Confirmed return appointment. Landis Gandy, RN

## 2015-02-26 NOTE — Telephone Encounter (Signed)
Pt wanting to know results of recent lab work.  Phone call to pt.  Left message with information related to his results, infection markers are back to normal.  Pt has a return appt in January.

## 2015-05-12 ENCOUNTER — Ambulatory Visit: Payer: Medicare Other | Admitting: Infectious Disease

## 2015-05-15 ENCOUNTER — Ambulatory Visit (INDEPENDENT_AMBULATORY_CARE_PROVIDER_SITE_OTHER): Payer: PPO | Admitting: Infectious Disease

## 2015-05-15 ENCOUNTER — Encounter: Payer: Self-pay | Admitting: Infectious Disease

## 2015-05-15 VITALS — BP 143/88 | HR 93 | Temp 98.5°F | Ht 74.0 in | Wt 187.5 lb

## 2015-05-15 DIAGNOSIS — T847XXS Infection and inflammatory reaction due to other internal orthopedic prosthetic devices, implants and grafts, sequela: Secondary | ICD-10-CM

## 2015-05-15 DIAGNOSIS — L819 Disorder of pigmentation, unspecified: Secondary | ICD-10-CM | POA: Diagnosis not present

## 2015-05-15 DIAGNOSIS — K6812 Psoas muscle abscess: Secondary | ICD-10-CM

## 2015-05-15 DIAGNOSIS — M4646 Discitis, unspecified, lumbar region: Secondary | ICD-10-CM

## 2015-05-15 HISTORY — DX: Disorder of pigmentation, unspecified: L81.9

## 2015-05-15 MED ORDER — MINOCYCLINE HCL 100 MG PO TABS: 100.0000 mg | ORAL_TABLET | Freq: Two times a day (BID) | ORAL | Status: DC

## 2015-05-15 NOTE — Progress Notes (Signed)
Chief complaint: persistent lower back pain and swelling in his left leg (chronic) along with darker pigmentation of his left foot  Subjective:    Patient ID: Marc Rose, male    DOB: 08/14/1950, 65 y.o.   MRN: [id number]  HPI   64 year old man with history of DM, multiple spinal surgeries who was admitted to [facility] on May 20th, 2015. At that time he was found to have abnormal fluid in L1-L2 concerning for osteomyelitis and left psoas muscle abscess. The abscess was felt not amenable to IR guided aspiration and he had already been on IV antibiotics so yield of cultures was felt to be low and it was not  Pursued and he was treated with I Vancomycin and oral levaquin.  Then on June 29th, 2015 he was again admitted. At that time he was found to have a pathological fracture in the L1-L2 area. He was taken to the OR and underwent T10-L4 posterior spinal fusion and instrumentation as well as removal of hardware at L1 and L2 area and bone grafting. Afterwards on July 6th, 2015 he had L1, L2 corpectomy, placement of pyarmesh cage in corpectomy defect, anterior plating from T12-L3 with Medtronic vantage plate and local rib graft. Multiple surgical cultures were negative including AFB, fungal and bacterial cultures (though I do not know if he had antecedent antibiotics--that is unfortunately typical including for our own hospital), He was initially placed on IV daptomycin and cefepime in the hospital. However at time of DC this was too costly a regimen and he was changed to IV vancomycin and oral ciprofloxacin. He then left AMA but ID at [facility],--Dr. Garnette Scheuermann, was able to start him on his IV vanco and po cipro.   He finished an 8 week course of IV vancomycin and oral cipro on 01/19/14 and was then changed to oral minocycline and cipro. The patient tells me that he felt the best when he was getting the IV abx. He was followed very closely by Dr. Garnette Scheuermann who monitored response to abx and followed ESR, CRP. Dr Garnette Scheuermann  changed the patient to oral minocycline alone this June of 2016.   The patient apparently had been frustrated with long drive to [facility] in [place] from where he currently lives. He is also frustrated that "they cant figure out what kind of infection I have after 2 years and somehow expects blood work to be something that we could use to pin down a specific infection. "   He has been referred to [facility] and seen by a physician there though I am not familiar with her name. He was told that he should never have spinal surgery again--which is not surprising.   Pt is worried about the idea of longterm potentially lifelong abx but I told him there are some people for whom this is needed and that he will need protracted if not lifelong suppressive abx.   His ESR and CRP were normal when we checked them though I DO NOT think he should come off abx at this point.   He has continued to have LBP and he also has swelling and pain in his left leg and darker pigmentation of his left foot. He has been seen by [facility] for this and had this worked up.  Past Medical History  Diagnosis Date  . Depression   . Impaired fasting glucose   . Hypertension   . Benign neoplasm of colon   . Unspecified retinal detachment   . Tobacco use  disorder   . Anxiety state, unspecified   . Essential hypertension, benign   . Elevated blood pressure reading without diagnosis of hypertension   . Causalgia of upper limb     Left limb  . Pure hypercholesterolemia   . Diabetes mellitus   . Arthritis   . Chronic leg pain   . Chronic back pain   . Peripheral arterial disease ([place])   . Diskitis 02/05/2015  . Hardware complicating wound infection ([place]) 02/05/2015    Past Surgical History  Procedure Laterality Date  . Ankle surgery      left  . Knee surgery      left  . Appendectomy    . Colonoscopy    . Back surgery      Family History  Problem Relation Age of Onset  . COPD Mother   .  Hyperthyroidism Mother   . Hyperthyroidism Sister       Social History   Social History  . Marital Status: Widowed    Spouse Name: N/A  . Number of Children: N/A  . Years of Education: N/A   Social History Main Topics  . Smoking status: Current Every Day Smoker -- 0.50 packs/day for 39 years    Types: Cigarettes  . Smokeless tobacco: Never Used  . Alcohol Use: No     Comment: occasionally  . Drug Use: Yes    Special: Marijuana  . Sexual Activity: Not Asked   Other Topics Concern  . None   Social History Narrative    Allergies  Allergen Reactions  . Codeine Anaphylaxis, Hives and Swelling  . Penicillins Anaphylaxis, Hives and Swelling  . Latex Itching  . Strawberry Extract Hives     Current outpatient prescriptions:  .  aspirin EC 81 MG tablet, Take 81 mg by mouth daily., Disp: , Rfl:  .  cyclobenzaprine (FLEXERIL) 10 MG tablet, Take 1 tablet (10 mg total) by mouth 3 (three) times daily as needed for muscle spasms., Disp: 30 tablet, Rfl: 0 .  cyclobenzaprine (FLEXERIL) 10 MG tablet, Take by mouth., Disp: , Rfl:  .  diazepam (VALIUM) 10 MG tablet, Take 0.5 tablets (5 mg total) by mouth every 6 (six) hours as needed for anxiety (back spasms)., Disp: 30 tablet, Rfl: 0 .  fluticasone (FLONASE) 50 MCG/ACT nasal spray, Place into the nose., Disp: , Rfl:  .  fluticasone (FLONASE) 50 MCG/ACT nasal spray, Place into the nose., Disp: , Rfl:  .  minocycline (DYNACIN) 100 MG tablet, Take 1 tablet (100 mg total) by mouth 2 (two) times daily., Disp: 60 tablet, Rfl: 11 .  NEOMYCIN-POLYMYXIN-HYDROCORTISONE (CORTISPORIN) 1 % SOLN otic solution, Place in ear(s)., Disp: , Rfl:  .  neomycin-polymyxin-hydrocortisone (CORTISPORIN) otic solution, Place in ear(s)., Disp: , Rfl:  .  pravastatin (PRAVACHOL) 20 MG tablet, Take 20 mg by mouth every evening., Disp: , Rfl:  .  pregabalin (LYRICA) 75 MG capsule, Take by mouth., Disp: , Rfl:     Review of Systems  Constitutional: Positive for  fatigue. Negative for fever, chills, diaphoresis, activity change, appetite change and unexpected weight change.  HENT: Negative for congestion, rhinorrhea, sinus pressure, sneezing, sore throat and trouble swallowing.   Eyes: Negative for photophobia and visual disturbance.  Respiratory: Negative for cough, chest tightness, shortness of breath, wheezing and stridor.   Cardiovascular: Positive for leg swelling. Negative for chest pain and palpitations.  Gastrointestinal: Negative for nausea, vomiting, abdominal pain, diarrhea, constipation, blood in stool, abdominal distention and anal bleeding.  Genitourinary: Negative  for dysuria, hematuria, flank pain and difficulty urinating.  Musculoskeletal: Positive for myalgias, back pain, joint swelling and arthralgias. Negative for gait problem.  Skin: Positive for color change and rash. Negative for pallor and wound.  Neurological: Negative for dizziness, tremors, weakness and light-headedness.  Hematological: Negative for adenopathy. Does not bruise/bleed easily.  Psychiatric/Behavioral: Negative for behavioral problems, confusion, sleep disturbance, dysphoric mood, decreased concentration and agitation.       Objective:   Physical Exam  Constitutional: He is oriented to person, place, and time. He appears well-developed and well-nourished.  HENT:  Head: Normocephalic and atraumatic.  Eyes: Conjunctivae and EOM are normal.  Neck: Normal range of motion. Neck supple.  Cardiovascular: Normal rate and regular rhythm.   Pulmonary/Chest: Effort normal. No respiratory distress. He has no wheezes.  Abdominal: Soft. He exhibits no distension.  Musculoskeletal: Normal range of motion. He exhibits edema. He exhibits no tenderness.       Arms: Neurological: He is alert and oriented to person, place, and time.  Skin: Skin is warm and dry. No rash noted. No erythema. No pallor.  Psychiatric: He exhibits a depressed mood.    Skin with hyperpigmentation  left foot 05/15/15:          Assessment & Plan:   Hardware associated diskitis, vertebral osteomyelitis, psoas abscess:  Continue minocycline and recheck labs and RTC in 6 months  Lower extremity edema: he states that this is due to his lyrica but that his PCP and pain management MDs want him taking it. I will defer to them  Darker pigmented skin on foot. I DONT THINK this is due to minocycline but if thought to be due to this could change to bactrim DS BID for his suppressive abx  I spent greater than 25 minutes with the patient including greater than 50% of time in face to face counsel of the patient re his hardware associate diskitis, chronic pain, edema, hyperpigmented skin rash and in coordination of their care.

## 2015-05-16 LAB — SEDIMENTATION RATE: Sed Rate: 4 mm/hr (ref 0–20)

## 2015-05-16 LAB — BASIC METABOLIC PANEL WITH GFR
BUN: 14 mg/dL (ref 7–25)
CHLORIDE: 105 mmol/L (ref 98–110)
CO2: 25 mmol/L (ref 20–31)
Calcium: 9.2 mg/dL (ref 8.6–10.3)
Creat: 1 mg/dL (ref 0.70–1.25)
GFR, Est Non African American: 79 mL/min (ref >=60)
GLUCOSE: 96 mg/dL (ref 65–99)
POTASSIUM: 4.4 mmol/L (ref 3.5–5.3)
Sodium: 144 mmol/L (ref 135–146)

## 2015-05-16 LAB — C-REACTIVE PROTEIN

## 2015-09-18 DIAGNOSIS — M5442 Lumbago with sciatica, left side: Secondary | ICD-10-CM | POA: Diagnosis not present

## 2015-09-18 DIAGNOSIS — R7301 Impaired fasting glucose: Secondary | ICD-10-CM | POA: Diagnosis not present

## 2015-09-18 DIAGNOSIS — I10 Essential (primary) hypertension: Secondary | ICD-10-CM | POA: Diagnosis not present

## 2015-09-18 DIAGNOSIS — E7800 Pure hypercholesterolemia, unspecified: Secondary | ICD-10-CM | POA: Diagnosis not present

## 2015-09-18 DIAGNOSIS — M5441 Lumbago with sciatica, right side: Secondary | ICD-10-CM | POA: Diagnosis not present

## 2015-09-18 DIAGNOSIS — G63 Polyneuropathy in diseases classified elsewhere: Secondary | ICD-10-CM | POA: Diagnosis not present

## 2015-09-18 DIAGNOSIS — K5904 Chronic idiopathic constipation: Secondary | ICD-10-CM | POA: Diagnosis not present

## 2015-09-18 DIAGNOSIS — R600 Localized edema: Secondary | ICD-10-CM | POA: Diagnosis not present

## 2015-09-18 DIAGNOSIS — G8929 Other chronic pain: Secondary | ICD-10-CM | POA: Diagnosis not present

## 2015-09-18 DIAGNOSIS — F419 Anxiety disorder, unspecified: Secondary | ICD-10-CM | POA: Diagnosis not present

## 2015-10-08 ENCOUNTER — Ambulatory Visit: Payer: PPO | Admitting: Infectious Disease

## 2015-10-22 ENCOUNTER — Ambulatory Visit: Payer: PPO | Admitting: Infectious Disease

## 2015-10-29 ENCOUNTER — Ambulatory Visit: Payer: PPO | Admitting: Infectious Disease

## 2015-11-12 ENCOUNTER — Ambulatory Visit (INDEPENDENT_AMBULATORY_CARE_PROVIDER_SITE_OTHER): Payer: PPO | Admitting: Infectious Disease

## 2015-11-12 ENCOUNTER — Encounter: Payer: Self-pay | Admitting: Infectious Disease

## 2015-11-12 VITALS — BP 112/78 | HR 84 | Temp 98.0°F | Wt 178.0 lb

## 2015-11-12 DIAGNOSIS — T847XXD Infection and inflammatory reaction due to other internal orthopedic prosthetic devices, implants and grafts, subsequent encounter: Secondary | ICD-10-CM | POA: Diagnosis not present

## 2015-11-12 DIAGNOSIS — M4646 Discitis, unspecified, lumbar region: Secondary | ICD-10-CM | POA: Diagnosis not present

## 2015-11-12 DIAGNOSIS — M549 Dorsalgia, unspecified: Secondary | ICD-10-CM | POA: Diagnosis not present

## 2015-11-12 DIAGNOSIS — L270 Generalized skin eruption due to drugs and medicaments taken internally: Secondary | ICD-10-CM

## 2015-11-12 DIAGNOSIS — K6812 Psoas muscle abscess: Secondary | ICD-10-CM | POA: Diagnosis not present

## 2015-11-12 DIAGNOSIS — G8929 Other chronic pain: Secondary | ICD-10-CM

## 2015-11-12 HISTORY — DX: Generalized skin eruption due to drugs and medicaments taken internally: L27.0

## 2015-11-12 LAB — BASIC METABOLIC PANEL WITH GFR
BUN: 14 mg/dL (ref 7–25)
CHLORIDE: 107 mmol/L (ref 98–110)
CO2: 27 mmol/L (ref 20–31)
CREATININE: 1.11 mg/dL (ref 0.70–1.25)
Calcium: 8.8 mg/dL (ref 8.6–10.3)
GFR, EST NON AFRICAN AMERICAN: 69 mL/min (ref >=60)
GFR, Est African American: 80 mL/min (ref >=60)
Glucose, Bld: 76 mg/dL (ref 65–99)
Potassium: 4.5 mmol/L (ref 3.5–5.3)
SODIUM: 140 mmol/L (ref 135–146)

## 2015-11-12 LAB — C-REACTIVE PROTEIN: CRP: <0.5 mg/dL (ref <0.60)

## 2015-11-12 MED ORDER — SULFAMETHOXAZOLE-TRIMETHOPRIM 800-160 MG PO TABS: 1.0000 | ORAL_TABLET | Freq: Two times a day (BID) | ORAL | Status: DC

## 2015-11-12 NOTE — Progress Notes (Signed)
Chief complaint: persistent lower back pain and swelling in his left lega(chronic) along with darker pigmentation of his left foot  Subjective:    Patient ID: Marc Rose, male    DOB: 1950/11/18, 65 y.o.   MRN: [id number]  HPI   65 year old man with history of DM, multiple spinal surgeries who was admitted to [facility] on May 20th, 2015. At that time he was found to have abnormal fluid in L1-L2 concerning for osteomyelitis and left psoas muscle abscess. The abscess was felt not amenable to IR guided aspiration and he had already been on IV antibiotics so yield of cultures was felt to be low and it was not  Pursued and he was treated with I Vancomycin and oral levaquin.  Then on June 29th, 2015 he was again admitted. At that time he was found to have a pathological fracture in the L1-L2 area. He was taken to the OR and underwent T10-L4 posterior spinal fusion and instrumentation as well as removal of hardware at L1 and L2 area and bone grafting. Afterwards on July 6th, 2015 he had L1, L2 corpectomy, placement of pyarmesh cage in corpectomy defect, anterior plating from T12-L3 with Medtr[employer] vantage plate and local rib graft. Multiple surgical cultures were negative including AFB, fungal and bacterial cultures (though I do not know if he had antecedent antibiotics--that is unfortunately typical including for our own hospital), He was initially placed on IV daptomycin and cefepime in the hospital. However at time of DC this was too costly a regimen and he was changed to IV vancomycin and oral ciprofloxacin. He then left AMA but ID at [facility],--Dr. Garnette Scheuermann, was able to start him on his IV vanco and po cipro.   He finished an 8 week course of IV vancomycin and oral cipro on 01/19/14 and was then changed to oral minocycline and cipro. The patient tells me that he felt the best when he was getting the IV abx. He was followed very closely by Dr. Garnette Scheuermann who monitored response to abx and followed ESR, CRP. Dr Garnette Scheuermann  changed the patient to oral minocycline alone this June of 2016.   The patient apparently had been frustrated with long drive to [facility] from where he currently lives. He is also frustrated that "they cant figure out what kind of infection I have after 2 years and somehow expects blood work to be something that we could use to pin down a specific infection. "   He has been referred to [facility] and seen by a physician there though I am not familiar with her name. He was told that he should never have spinal surgery again--which is not surprising.   Pt is worried about the idea of longterm potentially lifelong abx but I told him there are some people for whom this is needed and that he will need protracted if not lifelong suppressive abx.   His ESR and CRP were normal when we checked them though I DID NOT think he should come off abx at this point.   He has continued to have LBP and he also has swelling and pain in his left leg and darker pigmentation of his left foot. He has been seen by Vascular for this and had this worked up. Today he has persistent pain and again asks me if I have "found something out re his infection" to which I explained once again that I will not be able to ever do so unless he comes off infection  and has surgical bx for culture.  Past Medical History  Diagnosis Date  . Depression   . Impaired fasting glucose   . Hypertension   . Benign neoplasm of colon   . Unspecified retinal detachment   . Tobacco use disorder   . Anxiety state, unspecified   . Essential hypertension, benign   . Elevated blood pressure reading without diagnosis of hypertension   . Causalgia of upper limb     Left limb  . Pure hypercholesterolemia   . Diabetes mellitus   . Arthritis   . Chronic leg pain   . Chronic back pain   . Peripheral arterial disease ([place])   . Diskitis 02/05/2015  . Hardware complicating wound infection ([place]) 02/05/2015  . Hyperpigmentation  05/15/2015  . Drug rash 11/12/2015    Past Surgical History  Procedure Laterality Date  . Ankle surgery      left  . Knee surgery      left  . Appendectomy    . Colonoscopy    . Back surgery      Family History  Problem Relation Age of Onset  . COPD Mother   . Hyperthyroidism Mother   . Hyperthyroidism Sister       Social History   Social History  . Marital Status: Widowed    Spouse Name: N/A  . Number of Children: N/A  . Years of Education: N/A   Social History Main Topics  . Smoking status: Current Every Day Smoker -- 0.50 packs/day for 39 years    Types: Cigarettes  . Smokeless tobacco: Never Used  . Alcohol Use: No     Comment: occasionally  . Drug Use: Yes    Special: Marijuana  . Sexual Activity: Not Asked   Other Topics Concern  . None   Social History Narrative    Allergies  Allergen Reactions  . Codeine Anaphylaxis, Hives and Swelling  . Penicillins Anaphylaxis, Hives and Swelling  . Latex Itching  . Strawberry Extract Hives     Current outpatient prescriptions:  .  aspirin EC 81 MG tablet, Take 81 mg by mouth daily., Disp: , Rfl:  .  cyclobenzaprine (FLEXERIL) 10 MG tablet, Take 1 tablet (10 mg total) by mouth 3 (three) times daily as needed for muscle spasms., Disp: 30 tablet, Rfl: 0 .  cyclobenzaprine (FLEXERIL) 10 MG tablet, Take by mouth., Disp: , Rfl:  .  diazepam (VALIUM) 10 MG tablet, Take 0.5 tablets (5 mg total) by mouth every 6 (six) hours as needed for anxiety (back spasms)., Disp: 30 tablet, Rfl: 0 .  fluticasone (FLONASE) 50 MCG/ACT nasal spray, Place into the nose., Disp: , Rfl:  .  fluticasone (FLONASE) 50 MCG/ACT nasal spray, Place into the nose., Disp: , Rfl:  .  pravastatin (PRAVACHOL) 20 MG tablet, Take 20 mg by mouth every evening., Disp: , Rfl:  .  pregabalin (LYRICA) 75 MG capsule, Take by mouth., Disp: , Rfl:  .  NEOMYCIN-POLYMYXIN-HYDROCORTISONE (CORTISPORIN) 1 % SOLN otic solution, Place in ear(s). Reported on  11/12/2015, Disp: , Rfl:  .  neomycin-polymyxin-hydrocortisone (CORTISPORIN) otic solution, Place in ear(s). Reported on 11/12/2015, Disp: , Rfl:  .  sulfamethoxazole-trimethoprim (BACTRIM DS,SEPTRA DS) 800-160 MG tablet, Take 1 tablet by mouth 2 (two) times daily., Disp: 60 tablet, Rfl: 11    Review of Systems  Constitutional: Positive for fatigue. Negative for fever, chills, diaphoresis, activity change, appetite change and unexpected weight change.  HENT: Negative for congestion, rhinorrhea, sinus pressure, sneezing, sore throat and  trouble swallowing.   Eyes: Negative for photophobia and visual disturbance.  Respiratory: Negative for cough, chest tightness, shortness of breath, wheezing and stridor.   Cardiovascular: Positive for leg swelling. Negative for chest pain and palpitations.  Gastrointestinal: Negative for nausea, vomiting, abdominal pain, diarrhea, constipation, blood in stool, abdominal distention and anal bleeding.  Genitourinary: Negative for dysuria, hematuria, flank pain and difficulty urinating.  Musculoskeletal: Positive for myalgias, back pain, joint swelling and arthralgias. Negative for gait problem.  Skin: Positive for color change and rash. Negative for pallor and wound.  Neurological: Positive for numbness. Negative for dizziness, tremors, weakness and light-headedness.  Hematological: Negative for adenopathy. Does not bruise/bleed easily.  Psychiatric/Behavioral: Negative for behavioral problems, confusion, sleep disturbance, dysphoric mood, decreased concentration and agitation.       Objective:   Physical Exam  Constitutional: He is oriented to person, place, and time. He appears well-developed and well-nourished.  HENT:  Head: Normocephalic and atraumatic.  Eyes: Conjunctivae and EOM are normal.  Neck: Normal range of motion. Neck supple.  Cardiovascular: Normal rate and regular rhythm.   Pulmonary/Chest: Effort normal. No respiratory distress. He has no  wheezes.  Abdominal: Soft. He exhibits no distension.  Musculoskeletal: Normal range of motion. He exhibits edema. He exhibits no tenderness.  Neurological: He is alert and oriented to person, place, and time.  Skin: Skin is warm and dry. Rash noted. No erythema. No pallor.  Psychiatric: He exhibits a depressed mood.    Skin with hyperpigmentation left foot 05/15/15:     11/12/15:             Assessment & Plan:   Hardware associated diskitis, vertebral osteomyelitis, psoas abscess:  Recheck labs, change to Bactrim DS BID and check BMP in a week  Lower extremity edema:defer to PCP  Darker pigmented skin on foot: IN CASE this is due to minocycline will change from this to bactrim DS bid  I spent greater than 25 minutes with the patient including greater than 50% of time in face to face counsel of the patient re his hardware associate diskitis, chronic pain, edema, hyperpigmented skin rash and in coordination of his care.

## 2015-11-12 NOTE — Patient Instructions (Signed)
We will get labs today  I will change you from MInocycline which I would like you to stop since it may be causing the dark rash to   Septra DS 1 tablet twice daily  Please come back for repeat blood work in 2 weeks to ensure that you are doing ok on these meds

## 2015-11-13 LAB — SEDIMENTATION RATE: Sed Rate: 1 mm/hr (ref 0–20)

## 2015-11-26 ENCOUNTER — Other Ambulatory Visit: Payer: PPO

## 2015-11-26 DIAGNOSIS — T847XXD Infection and inflammatory reaction due to other internal orthopedic prosthetic devices, implants and grafts, subsequent encounter: Secondary | ICD-10-CM

## 2015-11-26 LAB — BASIC METABOLIC PANEL WITH GFR
BUN: 13 mg/dL (ref 7–25)
CO2: 24 mmol/L (ref 20–31)
Calcium: 9.2 mg/dL (ref 8.6–10.3)
Chloride: 106 mmol/L (ref 98–110)
Creat: 1.52 mg/dL — ABNORMAL HIGH (ref 0.70–1.25)
GFR, Est African American: 55 mL/min — ABNORMAL LOW (ref >=60)
GFR, Est Non African American: 47 mL/min — ABNORMAL LOW (ref >=60)
Glucose, Bld: 109 mg/dL — ABNORMAL HIGH (ref 65–99)
Potassium: 4.6 mmol/L (ref 3.5–5.3)
Sodium: 139 mmol/L (ref 135–146)

## 2015-11-27 ENCOUNTER — Other Ambulatory Visit: Payer: Self-pay | Admitting: Infectious Disease

## 2015-11-27 ENCOUNTER — Other Ambulatory Visit: Payer: Self-pay | Admitting: *Deleted

## 2015-11-27 ENCOUNTER — Telehealth: Payer: Self-pay | Admitting: *Deleted

## 2015-11-27 DIAGNOSIS — R7989 Other specified abnormal findings of blood chemistry: Secondary | ICD-10-CM

## 2015-11-27 MED ORDER — DOXYCYCLINE HYCLATE 100 MG PO TABS: 100.0000 mg | ORAL_TABLET | Freq: Two times a day (BID) | ORAL | 11 refills | Status: DC

## 2015-11-27 NOTE — Telephone Encounter (Signed)
Patient notified and stated he lived too far to come in to speak to pharmacist and does not really have any questions. Rx sent

## 2015-11-27 NOTE — Telephone Encounter (Signed)
Ok that is OK but he SHOULD have a repeat BMP drawn regardless

## 2015-11-27 NOTE — Telephone Encounter (Signed)
-----   Message from Truman Hayward, MD sent at 11/27/2015 12:56 PM EDT ----- Can we call Marc Rose and have him #1 stop his Bactrim, #2 start on script of doxycycline 100mg  twice daily with #60 and 11 refills #3 come back for repeat labs one week after making this change. If he needs someone to talk this over with maybe good idea to have him meet with Minh or Cassie> I had stopped his minocycline to imporove his rash but his kidney fxn looks worseon the bactrim

## 2015-11-28 NOTE — Telephone Encounter (Signed)
Ok very good thanks! 

## 2015-11-28 NOTE — Telephone Encounter (Signed)
He will come in one week for a repeat BMP.

## 2015-12-05 ENCOUNTER — Other Ambulatory Visit (INDEPENDENT_AMBULATORY_CARE_PROVIDER_SITE_OTHER): Payer: PPO

## 2015-12-05 DIAGNOSIS — R7989 Other specified abnormal findings of blood chemistry: Secondary | ICD-10-CM

## 2015-12-05 DIAGNOSIS — R748 Abnormal levels of other serum enzymes: Secondary | ICD-10-CM | POA: Diagnosis not present

## 2015-12-05 LAB — BASIC METABOLIC PANEL
BUN: 11 mg/dL (ref 7–25)
CALCIUM: 8.7 mg/dL (ref 8.6–10.3)
CO2: 28 mmol/L (ref 20–31)
CREATININE: 1.09 mg/dL (ref 0.70–1.25)
Chloride: 107 mmol/L (ref 98–110)
Glucose, Bld: 115 mg/dL — ABNORMAL HIGH (ref 65–99)
Potassium: 4.2 mmol/L (ref 3.5–5.3)
Sodium: 142 mmol/L (ref 135–146)

## 2015-12-18 DIAGNOSIS — L308 Other specified dermatitis: Secondary | ICD-10-CM | POA: Diagnosis not present

## 2015-12-18 DIAGNOSIS — L853 Xerosis cutis: Secondary | ICD-10-CM | POA: Diagnosis not present

## 2015-12-31 ENCOUNTER — Ambulatory Visit: Payer: PPO | Admitting: Infectious Disease

## 2016-01-21 ENCOUNTER — Encounter: Payer: Self-pay | Admitting: Infectious Disease

## 2016-01-21 ENCOUNTER — Ambulatory Visit (INDEPENDENT_AMBULATORY_CARE_PROVIDER_SITE_OTHER): Payer: PPO | Admitting: Infectious Disease

## 2016-01-21 VITALS — BP 139/96 | HR 108 | Temp 98.6°F | Wt 175.0 lb

## 2016-01-21 DIAGNOSIS — Z792 Long term (current) use of antibiotics: Secondary | ICD-10-CM | POA: Diagnosis not present

## 2016-01-21 DIAGNOSIS — M5441 Lumbago with sciatica, right side: Secondary | ICD-10-CM

## 2016-01-21 DIAGNOSIS — T847XXD Infection and inflammatory reaction due to other internal orthopedic prosthetic devices, implants and grafts, subsequent encounter: Secondary | ICD-10-CM | POA: Diagnosis not present

## 2016-01-21 DIAGNOSIS — M5442 Lumbago with sciatica, left side: Secondary | ICD-10-CM

## 2016-01-21 DIAGNOSIS — M4802 Spinal stenosis, cervical region: Secondary | ICD-10-CM | POA: Diagnosis not present

## 2016-01-21 LAB — CBC WITH DIFFERENTIAL/PLATELET
BASOS PCT: 1 %
Basophils Absolute: 50 cells/uL (ref 0–200)
EOS PCT: 2 %
Eosinophils Absolute: 100 cells/uL (ref 15–500)
HEMATOCRIT: 42.9 % (ref 38.5–50.0)
Hemoglobin: 14.3 g/dL (ref 13.2–17.1)
Lymphocytes Relative: 31 %
Lymphs Abs: 1550 cells/uL (ref 850–3900)
MCH: 32.2 pg (ref 27.0–33.0)
MCHC: 33.3 g/dL (ref 32.0–36.0)
MCV: 96.6 fL (ref 80.0–100.0)
MONO ABS: 600 {cells}/uL (ref 200–950)
MONOS PCT: 12 %
MPV: 9.2 fL (ref 7.5–12.5)
Neutro Abs: 2700 cells/uL (ref 1500–7800)
Neutrophils Relative %: 54 %
PLATELETS: 268 10*3/uL (ref 140–400)
RBC: 4.44 MIL/uL (ref 4.20–5.80)
RDW: 15.3 % — AB (ref 11.0–15.0)
WBC: 5 10*3/uL (ref 3.8–10.8)

## 2016-01-21 NOTE — Progress Notes (Signed)
Chief complaint: persistent lower back pain but improvement in rash on his foot  Subjective:    Patient ID: Marc Rose, male    DOB: 21-Jan-1951, 65 y.o.   MRN: [id number]  HPI  65 year old man with history of DM, multiple spinal surgeries who was admitted to [facility] on May 20th, 2015. At that time he was found to have abnormal fluid in L1-L2 concerning for osteomyelitis and left psoas muscle abscess. The abscess was felt not amenable to IR guided aspiration and he had already been on IV antibiotics so yield of cultures was felt to be low and it was not  Pursued and he was treated with I Vancomycin and oral levaquin.  Then on June 29th, 2015 he was again admitted. At that time he was found to have a pathological fracture in the L1-L2 area. He was taken to the OR and underwent T10-L4 posterior spinal fusion and instrumentation as well as removal of hardware at L1 and L2 area and bone grafting. Afterwards on July 6th, 2015 he had L1, L2 corpectomy, placement of pyarmesh cage in corpectomy defect, anterior plating from T12-L3 with [employer]tr[employer] vantage plate and local rib graft. Multiple surgical cultures were negative including AFB, fungal and bacterial cultures (though I do not know if he had antecedent antibiotics--that is unfortunately typical including for our own hospital), He was initially placed on IV daptomycin and cefepime in the hospital. However at time of DC this was too costly a regimen and he was changed to IV vancomycin and oral ciprofloxacin. He then left AMA but ID at [facility],--Dr. Garnette Scheuermann, was able to start him on his IV vanco and po cipro.   He finished an 8 week course of IV vancomycin and oral cipro on 01/19/14 and was then changed to oral minocycline and cipro. The patient tells me that he felt the best when he was getting the IV abx. He was followed very closely by Dr. Garnette Scheuermann who monitored response to abx and followed ESR, CRP. Dr Garnette Scheuermann changed the patient to oral minocycline alone this  June of 2016.   The patient apparently had been frustrated with long drive to [facility] in [facility] from where he currently lives. He is also frustrated that "they cant figure out what kind of infection I have after 2 years and somehow expects blood work to be something that we could use to pin down a specific infection. "   He has been referred to [facility] and seen by a physician there though I am not familiar with her name. He was told that he should never have spinal surgery again--which is not surprising.   Pt is worried about the idea of longterm potentially lifelong abx but I told him there are some people for whom this is needed and that he will need protracted if not lifelong suppressive abx.   His ESR and CRP were normal when we checked them though I DID NOT think he should come off abx at this point.   At last visit he continued to have LBP and he also has swelling and pain in his left leg and darker pigmentation of his left foot. He has been seen by Vascular for this and had this worked up. Today he has persistent pain and again asks me if I have "found something out re his infection" to which I explained once again that I will not be able to ever do so unless he comes off infection and has surgical bx for culture.  I changed him to bactrim due to his chronic skin changes but he had signficant bump in his serum creatinine and we changed him back to a doxycycline. He states that since that visit he has seen a Dermatologist who had determined that two of his other medicines (one was lyrica) were responsible for his skin rash and had DC them and rx clobetasol.  Back pain is stable though he wondered about his kidneys because he had some newer low back pain that coincided with the drinking water becoming very opaque.   Past Medical History:  Diagnosis Date  . Anxiety state, unspecified   . Arthritis   . Benign neoplasm of colon   . Causalgia of upper limb    Left limb  .  Chronic back pain   . Chronic leg pain   . Depression   . Diabetes mellitus   . Diskitis 02/05/2015  . Drug rash 11/12/2015  . Elevated blood pressure reading without diagnosis of hypertension   . Essential hypertension, benign   . Hardware complicating wound infection ([place]) 02/05/2015  . Hyperpigmentation 05/15/2015  . Hypertension   . Impaired fasting glucose   . Peripheral arterial disease ([place])   . Pure hypercholesterolemia   . Tobacco use disorder   . Unspecified retinal detachment     Past Surgical History:  Procedure Laterality Date  . ANKLE SURGERY     left  . APPENDECTOMY    . BACK SURGERY    . COLONOSCOPY    . KNEE SURGERY     left    Family History  Problem Relation Age of Onset  . COPD Mother   . Hyperthyroidism Mother   . Hyperthyroidism Sister       Social History   Social History  . Marital status: Widowed    Spouse name: N/A  . Number of children: N/A  . Years of education: N/A   Social History Main Topics  . Smoking status: Current Every Day Smoker    Packs/day: 0.50    Years: 39.00    Types: Cigarettes  . Smokeless tobacco: Never Used  . Alcohol use No     Comment: occasionally  . Drug use:     Types: Marijuana  . Sexual activity: Not on file   Other Topics Concern  . Not on file   Social History Narrative  . No narrative on file    Allergies  Allergen Reactions  . Codeine Anaphylaxis, Hives and Swelling  . Penicillins Anaphylaxis, Hives and Swelling  . Latex Itching  . Strawberry Extract Hives     Current Outpatient Prescriptions:  .  aspirin EC 81 MG tablet, Take 81 mg by mouth daily., Disp: , Rfl:  .  cyclobenzaprine (FLEXERIL) 10 MG tablet, Take 1 tablet (10 mg total) by mouth 3 (three) times daily as needed for muscle spasms., Disp: 30 tablet, Rfl: 0 .  cyclobenzaprine (FLEXERIL) 10 MG tablet, Take by mouth., Disp: , Rfl:  .  diazepam (VALIUM) 10 MG tablet, Take 0.5 tablets (5 mg total) by mouth every 6 (six) hours as  needed for anxiety (back spasms)., Disp: 30 tablet, Rfl: 0 .  doxycycline (VIBRA-TABS) 100 MG tablet, Take 1 tablet (100 mg total) by mouth 2 (two) times daily., Disp: 60 tablet, Rfl: 11 .  fluticasone (FLONASE) 50 MCG/ACT nasal spray, Place into the nose., Disp: , Rfl:  .  fluticasone (FLONASE) 50 MCG/ACT nasal spray, Place into the nose., Disp: , Rfl:  .  NEOMYCIN-POLYMYXIN-HYDROCORTISONE (CORTISPORIN) 1 %  SOLN otic solution, Place in ear(s). Reported on 11/12/2015, Disp: , Rfl:  .  neomycin-polymyxin-hydrocortisone (CORTISPORIN) otic solution, Place in ear(s). Reported on 11/12/2015, Disp: , Rfl:  .  pravastatin (PRAVACHOL) 20 MG tablet, Take 20 mg by mouth every evening., Disp: , Rfl:  .  pregabalin (LYRICA) 75 MG capsule, Take by mouth., Disp: , Rfl:     Review of Systems  Constitutional: Positive for fatigue. Negative for activity change, appetite change, chills, diaphoresis, fever and unexpected weight change.  HENT: Negative for congestion, rhinorrhea, sinus pressure, sneezing, sore throat and trouble swallowing.   Eyes: Negative for photophobia and visual disturbance.  Respiratory: Negative for cough, chest tightness, shortness of breath, wheezing and stridor.   Cardiovascular: Positive for leg swelling. Negative for chest pain and palpitations.  Gastrointestinal: Negative for abdominal distention, abdominal pain, anal bleeding, blood in stool, constipation, diarrhea, nausea and vomiting.  Genitourinary: Negative for difficulty urinating, dysuria, flank pain and hematuria.  Musculoskeletal: Positive for arthralgias, back pain, joint swelling and myalgias. Negative for gait problem.  Skin: Positive for color change and rash. Negative for pallor and wound.  Neurological: Positive for numbness. Negative for dizziness, tremors, weakness and light-headedness.  Hematological: Negative for adenopathy. Does not bruise/bleed easily.  Psychiatric/Behavioral: Negative for agitation, behavioral  problems, confusion, decreased concentration, dysphoric mood and sleep disturbance.       Objective:   Physical Exam  Constitutional: He is oriented to person, place, and time. He appears well-developed and well-nourished.  HENT:  Head: Normocephalic and atraumatic.  Eyes: Conjunctivae and EOM are normal.  Neck: Normal range of motion. Neck supple.  Cardiovascular: Normal rate and regular rhythm.   Pulmonary/Chest: Effort normal. No respiratory distress. He has no wheezes.  Abdominal: Soft. He exhibits no distension.  Musculoskeletal: Normal range of motion. He exhibits edema. He exhibits no tenderness.  Neurological: He is alert and oriented to person, place, and time.  Skin: Skin is warm and dry. Rash noted. No erythema. No pallor.    Skin with hyperpigmentation left foot 05/15/15:     11/12/15:         01/21/16:          Assessment & Plan:   Hardware associated diskitis, vertebral osteomyelitis, psoas abscess:  Continue doxycycline indefinitely and recheck ESR and CRP along with CBC  w diff and CMP w GFR    Darker pigmented skin on foot: not clear to me what the origin is but we are going back to tetracycline. Doxycycline

## 2016-01-22 LAB — COMPLETE METABOLIC PANEL WITHOUT GFR
ALT: 16 U/L (ref 9–46)
AST: 21 U/L (ref 10–35)
Albumin: 4.2 g/dL (ref 3.6–5.1)
Alkaline Phosphatase: 74 U/L (ref 40–115)
BUN: 11 mg/dL (ref 7–25)
CO2: 26 mmol/L (ref 20–31)
Calcium: 9.5 mg/dL (ref 8.6–10.3)
Chloride: 107 mmol/L (ref 98–110)
Creat: 1 mg/dL (ref 0.70–1.25)
GFR, Est African American: >89 mL/min
GFR, Est Non African American: 79 mL/min
Glucose, Bld: 89 mg/dL (ref 65–99)
Potassium: 4.4 mmol/L (ref 3.5–5.3)
Sodium: 143 mmol/L (ref 135–146)
Total Bilirubin: 0.5 mg/dL (ref 0.2–1.2)
Total Protein: 6.3 g/dL (ref 6.1–8.1)

## 2016-01-22 LAB — C-REACTIVE PROTEIN: CRP: 1.2 mg/L (ref <8.0)

## 2016-01-22 LAB — SEDIMENTATION RATE: Sed Rate: 4 mm/h (ref 0–20)

## 2016-02-17 DIAGNOSIS — I10 Essential (primary) hypertension: Secondary | ICD-10-CM | POA: Diagnosis not present

## 2016-02-17 DIAGNOSIS — Z23 Encounter for immunization: Secondary | ICD-10-CM | POA: Diagnosis not present

## 2016-02-17 DIAGNOSIS — M5442 Lumbago with sciatica, left side: Secondary | ICD-10-CM | POA: Diagnosis not present

## 2016-02-17 DIAGNOSIS — E785 Hyperlipidemia, unspecified: Secondary | ICD-10-CM | POA: Diagnosis not present

## 2016-02-17 DIAGNOSIS — F419 Anxiety disorder, unspecified: Secondary | ICD-10-CM | POA: Diagnosis not present

## 2016-02-17 DIAGNOSIS — G8929 Other chronic pain: Secondary | ICD-10-CM | POA: Diagnosis not present

## 2016-02-17 DIAGNOSIS — R7301 Impaired fasting glucose: Secondary | ICD-10-CM | POA: Diagnosis not present

## 2016-02-17 DIAGNOSIS — M5441 Lumbago with sciatica, right side: Secondary | ICD-10-CM | POA: Diagnosis not present

## 2016-02-20 DIAGNOSIS — M549 Dorsalgia, unspecified: Secondary | ICD-10-CM | POA: Diagnosis not present

## 2016-02-20 DIAGNOSIS — M961 Postlaminectomy syndrome, not elsewhere classified: Secondary | ICD-10-CM | POA: Diagnosis not present

## 2016-02-20 DIAGNOSIS — M4625 Osteomyelitis of vertebra, thoracolumbar region: Secondary | ICD-10-CM | POA: Diagnosis not present

## 2016-02-20 DIAGNOSIS — M960 Pseudarthrosis after fusion or arthrodesis: Secondary | ICD-10-CM | POA: Diagnosis not present

## 2016-03-01 DIAGNOSIS — M4625 Osteomyelitis of vertebra, thoracolumbar region: Secondary | ICD-10-CM | POA: Diagnosis not present

## 2016-03-01 DIAGNOSIS — M4716 Other spondylosis with myelopathy, lumbar region: Secondary | ICD-10-CM | POA: Diagnosis not present

## 2016-03-18 DIAGNOSIS — I10 Essential (primary) hypertension: Secondary | ICD-10-CM | POA: Diagnosis not present

## 2016-03-18 DIAGNOSIS — E785 Hyperlipidemia, unspecified: Secondary | ICD-10-CM | POA: Diagnosis not present

## 2016-03-18 DIAGNOSIS — R7301 Impaired fasting glucose: Secondary | ICD-10-CM | POA: Diagnosis not present

## 2016-04-01 DIAGNOSIS — M4716 Other spondylosis with myelopathy, lumbar region: Secondary | ICD-10-CM | POA: Diagnosis not present

## 2016-04-01 DIAGNOSIS — M4625 Osteomyelitis of vertebra, thoracolumbar region: Secondary | ICD-10-CM | POA: Diagnosis not present

## 2016-05-17 ENCOUNTER — Encounter (HOSPITAL_COMMUNITY): Payer: Self-pay | Admitting: Emergency Medicine

## 2016-05-17 ENCOUNTER — Emergency Department (HOSPITAL_COMMUNITY)
Admission: EM | Admit: 2016-05-17 | Discharge: 2016-05-17 | Disposition: A | Discharge status: Home / Self Care | Payer: PPO | Attending: Emergency Medicine | Admitting: Emergency Medicine

## 2016-05-17 DIAGNOSIS — I10 Essential (primary) hypertension: Secondary | ICD-10-CM | POA: Diagnosis not present

## 2016-05-17 DIAGNOSIS — F17210 Nicotine dependence, cigarettes, uncomplicated: Secondary | ICD-10-CM | POA: Insufficient documentation

## 2016-05-17 DIAGNOSIS — G8929 Other chronic pain: Secondary | ICD-10-CM | POA: Diagnosis not present

## 2016-05-17 DIAGNOSIS — Z7982 Long term (current) use of aspirin: Secondary | ICD-10-CM | POA: Diagnosis not present

## 2016-05-17 DIAGNOSIS — E119 Type 2 diabetes mellitus without complications: Secondary | ICD-10-CM | POA: Insufficient documentation

## 2016-05-17 DIAGNOSIS — R109 Unspecified abdominal pain: Secondary | ICD-10-CM | POA: Insufficient documentation

## 2016-05-17 MED ORDER — KETOROLAC TROMETHAMINE 30 MG/ML IJ SOLN
30.0000 mg | Freq: Once | INTRAMUSCULAR | Status: AC
  Administered 2016-05-17: 30 mg via INTRAMUSCULAR
  Filled 2016-05-17: qty 1

## 2016-05-17 NOTE — ED Notes (Signed)
Pt states that his left foot is black and I asked him to remove his left shoe.  Pt has bruising to the ankle on both sides.  Pt is expressing his feelings about his health and his meds.

## 2016-05-17 NOTE — ED Provider Notes (Signed)
[facility] DEPT Provider Note   CSN: [id number] Arrival date & time: 05/17/16  [id number]  By signing my name below, I, Evelene Croon, attest that this documentation has been prepared under the direction and in the presence of Forde Dandy, MD . Electronically Signed: Evelene Croon, Scribe. 05/17/2016. 10:26 AM.  History   Chief Complaint Chief Complaint  Patient presents with  . Flank Pain    The history is provided by the patient. No language interpreter was used.    HPI Comments:  Marc Rose is a 66 y.o. male with a history of DM, HTN, and chronic back pain, who presents to the Emergency Department complaining of moderate pain from the left lower abdomen around to the left flank. He has had this pain since 2014 2/2 back surgeries. He has had this pain more over past 3 days. No PCP for follow-up. Marland Kitchen  He reports associated urine discoloration, describes pinkish/orange tinged urine which he's had daily since 2014. He notes he has been taking antibiotics daily since having emergency back surgery in 2014 and has also been experiencing this same pain since that time. He has been prescribed Lyrica for pain but has not been taking it due to insurance issues. He will however be getting his Flexril and Aleve prescriptions filled within the next month. He has been evaluated by GI, and infectious disease, and has had his kidneys evaluated for this same pain over the years. No alleviating factors noted; no treatments tried PTA.  He denies fever, chills, dysuria, vomiting, and diarrhea.    Infectious Disease: Tommy Medal  No PCP  Past Medical History:  Diagnosis Date  . Anxiety state, unspecified   . Arthritis   . Benign neoplasm of colon   . Causalgia of upper limb    Left limb  . Chronic back pain   . Chronic leg pain   . Depression   . Diabetes mellitus   . Diskitis 02/05/2015  . Drug rash 11/12/2015  . Elevated blood pressure reading without diagnosis of hypertension   . Essential  hypertension, benign   . Hardware complicating wound infection ([place]) 02/05/2015  . Hyperpigmentation 05/15/2015  . Hypertension   . Impaired fasting glucose   . Peripheral arterial disease ([place])   . Pure hypercholesterolemia   . Tobacco use disorder   . Unspecified retinal detachment     Patient Active Problem List   Diagnosis Date Noted  . Drug rash 11/12/2015  . Hyperpigmentation 05/15/2015  . Abdominal pain, generalized 02/05/2015  . Abdominal pain, right lower quadrant 02/05/2015  . Abscess, psoas ([place]) 02/05/2015  . Acute upper respiratory infection 02/05/2015  . Long term current use of antibiotics 02/05/2015  . Benign essential HTN 02/05/2015  . Edema leg 02/05/2015  . Complex regional pain syndrome type 2 of upper extremity 02/05/2015  . Back pain, chronic 02/05/2015  . CN (constipation) 02/05/2015  . CD (contact dermatitis) 02/05/2015  . Elevated fasting blood sugar 02/05/2015  . LBP (low back pain) 02/05/2015  . Discitis of lumbar region 02/05/2015  . Disorder of nutrition 02/05/2015  . Feeling bilious 02/05/2015  . Flu vaccine need 02/05/2015  . Peripheral neuropathic pain 02/05/2015  . Non compliance with medical treatment 02/05/2015  . Abnormal blood chemistry 02/05/2015  . Angiopathy, peripheral ([place]) 02/05/2015  . Nerve disease, peripheral ([place]) 02/05/2015  . Hypercholesterolemia without hypertriglyceridemia 02/05/2015  . Spinal stenosis 02/05/2015  . Buzzing in ear 02/05/2015  . Compulsive tobacco user syndrome 02/05/2015  . Bladder retention  02/05/2015  . Abnormal weight loss 02/05/2015  . Diskitis 02/05/2015  . Hardware complicating wound infection ([place]) 02/05/2015  . Hyperlipidemia 07/05/2014  . Peripheral arterial disease ([place]) 07/05/2014  . Hypotension 09/16/2013  . HTN (hypertension) 09/16/2013  . Anxiety 09/16/2013  . Back pain 09/16/2013    Past Surgical History:  Procedure Laterality Date  . ANKLE SURGERY     left  . APPENDECTOMY    .  BACK SURGERY    . COLONOSCOPY    . KNEE SURGERY     left       Home Medications    Prior to Admission medications   Medication Sig Start Date End Date Taking? Authorizing Provider  aspirin EC 81 MG tablet Take 81 mg by mouth daily.    Historical Provider, MD  cyclobenzaprine (FLEXERIL) 10 MG tablet Take 1 tablet (10 mg total) by mouth 3 (three) times daily as needed for muscle spasms. 09/19/13   Sheila Oats, MD  diazepam (VALIUM) 10 MG tablet Take 0.5 tablets (5 mg total) by mouth every 6 (six) hours as needed for anxiety (back spasms). 09/19/13   Sheila Oats, MD  doxycycline (VIBRA-TABS) 100 MG tablet Take 1 tablet (100 mg total) by mouth 2 (two) times daily. 11/27/15   Truman Hayward, MD  fluticasone [facility]) 50 MCG/ACT nasal spray Place into the nose. 07/04/14   Historical Provider, MD  NEOMYCIN-POLYMYXIN-HYDROCORTISONE (CORTISPORIN) 1 % SOLN otic solution Place in ear(s). Reported on 11/12/2015 07/04/14   Historical Provider, MD  pravastatin (PRAVACHOL) 20 MG tablet Take 20 mg by mouth every evening.    Historical Provider, MD  pregabalin (LYRICA) 75 MG capsule Take by mouth. 05/07/13   Historical Provider, MD    Family History Family History  Problem Relation Age of Onset  . COPD Mother   . Hyperthyroidism Mother   . Hyperthyroidism Sister     Social History Social History  Substance Use Topics  . Smoking status: Current Every Day Smoker    Packs/day: 0.50    Years: 39.00    Types: Cigarettes  . Smokeless tobacco: Never Used  . Alcohol use No     Comment: occasionally     Allergies   Codeine; Penicillins; Latex; and Strawberry extract   Review of Systems Review of Systems 10 systems reviewed and all are negative for acute change except as noted in the HPI.    Physical Exam Updated Vital Signs BP (!) 134/101 (BP Location: Left Arm)   Pulse 96   Temp 98.4 F (36.9 C) (Oral)   Resp 22   Ht 6\' 2"  (1.88 m)   Wt 165 lb (74.8 kg)   SpO2 99%   BMI  21.18 kg/m   Physical Exam  Physical Exam  Nursing note and vitals reviewed. Constitutional: Well developed, well nourished, non-toxic, and in no acute distress Head: Normocephalic and atraumatic.  Mouth/Throat: Oropharynx is clear and moist.  Neck: Normal range of motion. Neck supple.  Cardiovascular: Normal rate and regular rhythm.   Pulmonary/Chest: Effort normal and breath sounds normal.  Abdominal: Soft. There is no tenderness. There is no rebound and no guarding.  Musculoskeletal: Normal range of motion. Left flank numbness with palpation. Multiple post surgical wounds involving low lumbar spine, clean dry and intact  Neurological: Alert, no facial droop, fluent speech, moves all extremities symmetrically Skin: Skin is warm and dry.  Psychiatric: Cooperative   ED Treatments / Results  DIAGNOSTIC STUDIES:  Oxygen Saturation is 99% on RA, normal  by my interpretation.    COORDINATION OF CARE:  10:09 AM Discussed treatment plan with pt at bedside and pt agreed to plan.  Labs (all labs ordered are listed, but only abnormal results are displayed) Labs Reviewed - No data to display  EKG  EKG Interpretation None       Radiology No results found.  Procedures Procedures (including critical care time)  Medications Ordered in ED Medications  ketorolac (TORADOL) 30 MG/ML injection 30 mg (not administered)     Initial Impression / Assessment and Plan / ED Course  I have reviewed the triage vital signs and the nursing notes.  Pertinent labs & imaging results that were available during my care of the patient were reviewed by me and considered in my medical decision making (see chart for details).  Clinical Course     Records reviewed. He has had multiple back surgeries, including complication with chronic discitis, osteomyelitis since 2015. Recently seen by Dr. Drucilla Schmidt from ID, recommending chronic antibiotics with doxycycline. States that pain that he is feeling as  his chronic pain and having chronic numbness of the left flank as well. He is well-appearing, stable vital signs. Abdomen is soft and benign without any tenderness on my exam. No new symptoms or features that would be concerning for new spinal cord or spine process, retroperitoneal process, or intra-abdominal process. Referrals provided for primary care follow-up. Strict return and follow-up instructions reviewed. He expressed understanding of all discharge instructions and felt comfortable with the plan of care.   Final Clinical Impressions(s) / ED Diagnoses   Final diagnoses:  Chronic flank pain    New Prescriptions New Prescriptions   No medications on file   Medical screening examination/treatment/procedure(s) were performed by non-physician practitioner and as supervising physician I was immediately available for consultation/collaboration.   EKG Interpretation None          Forde Dandy, MD 05/17/16 1036

## 2016-05-17 NOTE — ED Triage Notes (Signed)
Patient complaining of left flank pain x 1 week. States "this has been going on since 2014 when I had surgery on my back."

## 2016-05-17 NOTE — Discharge Instructions (Signed)
Please continue home pregabalin, aleve, flexeril and valium for pain.   Please establish a primary care doctor for ongoing pain control.  Return for worsening symptoms, including fever, intractable vomiting, inability to walk, new numbness/weakness or any other symptoms concerning to you.

## 2016-05-26 ENCOUNTER — Telehealth: Payer: Self-pay | Admitting: *Deleted

## 2016-05-26 NOTE — Telephone Encounter (Signed)
Thanks Jackie 

## 2016-05-26 NOTE — Telephone Encounter (Signed)
Patient called stating he feels like he has ticks or bugs coming out of his skin. He thought it was coming from the doxycycline, but he has been on this medication for two years. Advised patient that he needs to be seen by a PCP and I gave him the number for [facility] who is taking new patients.

## 2016-05-28 ENCOUNTER — Ambulatory Visit (INDEPENDENT_AMBULATORY_CARE_PROVIDER_SITE_OTHER): Payer: PPO | Admitting: Urology

## 2016-05-28 ENCOUNTER — Other Ambulatory Visit (HOSPITAL_COMMUNITY)
Admission: RE | Admit: 2016-05-28 | Discharge: 2016-05-28 | Disposition: A | Discharge status: Home / Self Care | Payer: PPO | Source: Other Acute Inpatient Hospital | Attending: Urology | Admitting: Urology

## 2016-05-28 DIAGNOSIS — R338 Other retention of urine: Secondary | ICD-10-CM | POA: Insufficient documentation

## 2016-05-28 DIAGNOSIS — N312 Flaccid neuropathic bladder, not elsewhere classified: Secondary | ICD-10-CM | POA: Diagnosis not present

## 2016-05-28 LAB — URINALYSIS, COMPLETE (UACMP) WITH MICROSCOPIC
BILIRUBIN URINE: NEGATIVE
GLUCOSE, UA: NEGATIVE mg/dL
KETONES UR: NEGATIVE mg/dL
Nitrite: NEGATIVE
PROTEIN: 100 mg/dL — AB
Specific Gravity, Urine: 1.019 (ref 1.005–1.030)
pH: 5 (ref 5.0–8.0)

## 2016-05-31 LAB — URINE CULTURE

## 2016-06-01 DIAGNOSIS — R339 Retention of urine, unspecified: Secondary | ICD-10-CM | POA: Diagnosis not present

## 2016-06-03 ENCOUNTER — Inpatient Hospital Stay (HOSPITAL_COMMUNITY): Payer: PPO

## 2016-06-03 ENCOUNTER — Inpatient Hospital Stay (HOSPITAL_COMMUNITY)
Admission: EM | Admit: 2016-06-03 | Discharge: 2016-06-25 | DRG: 698 | Disposition: A | Discharge status: Other Acute Inpatient Hospital | Payer: PPO | Attending: Internal Medicine | Admitting: Internal Medicine

## 2016-06-03 ENCOUNTER — Encounter (HOSPITAL_COMMUNITY): Payer: Self-pay | Admitting: Emergency Medicine

## 2016-06-03 ENCOUNTER — Emergency Department (HOSPITAL_COMMUNITY): Payer: PPO

## 2016-06-03 DIAGNOSIS — J9601 Acute respiratory failure with hypoxia: Secondary | ICD-10-CM | POA: Diagnosis not present

## 2016-06-03 DIAGNOSIS — E871 Hypo-osmolality and hyponatremia: Secondary | ICD-10-CM | POA: Diagnosis not present

## 2016-06-03 DIAGNOSIS — Y95 Nosocomial condition: Secondary | ICD-10-CM | POA: Diagnosis not present

## 2016-06-03 DIAGNOSIS — R1311 Dysphagia, oral phase: Secondary | ICD-10-CM | POA: Diagnosis not present

## 2016-06-03 DIAGNOSIS — M4646 Discitis, unspecified, lumbar region: Secondary | ICD-10-CM | POA: Diagnosis present

## 2016-06-03 DIAGNOSIS — E1151 Type 2 diabetes mellitus with diabetic peripheral angiopathy without gangrene: Secondary | ICD-10-CM | POA: Diagnosis not present

## 2016-06-03 DIAGNOSIS — R531 Weakness: Secondary | ICD-10-CM

## 2016-06-03 DIAGNOSIS — R404 Transient alteration of awareness: Secondary | ICD-10-CM | POA: Diagnosis not present

## 2016-06-03 DIAGNOSIS — E1136 Type 2 diabetes mellitus with diabetic cataract: Secondary | ICD-10-CM | POA: Diagnosis present

## 2016-06-03 DIAGNOSIS — R571 Hypovolemic shock: Secondary | ICD-10-CM | POA: Diagnosis not present

## 2016-06-03 DIAGNOSIS — E43 Unspecified severe protein-calorie malnutrition: Secondary | ICD-10-CM | POA: Diagnosis not present

## 2016-06-03 DIAGNOSIS — Z789 Other specified health status: Secondary | ICD-10-CM

## 2016-06-03 DIAGNOSIS — Z7189 Other specified counseling: Secondary | ICD-10-CM | POA: Diagnosis not present

## 2016-06-03 DIAGNOSIS — J90 Pleural effusion, not elsewhere classified: Secondary | ICD-10-CM | POA: Diagnosis not present

## 2016-06-03 DIAGNOSIS — T83518A Infection and inflammatory reaction due to other urinary catheter, initial encounter: Secondary | ICD-10-CM | POA: Diagnosis not present

## 2016-06-03 DIAGNOSIS — N319 Neuromuscular dysfunction of bladder, unspecified: Secondary | ICD-10-CM | POA: Diagnosis not present

## 2016-06-03 DIAGNOSIS — E872 Acidosis: Secondary | ICD-10-CM | POA: Diagnosis not present

## 2016-06-03 DIAGNOSIS — R1084 Generalized abdominal pain: Secondary | ICD-10-CM | POA: Diagnosis present

## 2016-06-03 DIAGNOSIS — T50Z15A Adverse effect of immunoglobulin, initial encounter: Secondary | ICD-10-CM | POA: Diagnosis not present

## 2016-06-03 DIAGNOSIS — Y92239 Unspecified place in hospital as the place of occurrence of the external cause: Secondary | ICD-10-CM | POA: Diagnosis not present

## 2016-06-03 DIAGNOSIS — R1312 Dysphagia, oropharyngeal phase: Secondary | ICD-10-CM

## 2016-06-03 DIAGNOSIS — J690 Pneumonitis due to inhalation of food and vomit: Secondary | ICD-10-CM | POA: Diagnosis not present

## 2016-06-03 DIAGNOSIS — R569 Unspecified convulsions: Secondary | ICD-10-CM | POA: Diagnosis not present

## 2016-06-03 DIAGNOSIS — Y833 Surgical operation with formation of external stoma as the cause of abnormal reaction of the patient, or of later complication, without mention of misadventure at the time of the procedure: Secondary | ICD-10-CM | POA: Diagnosis not present

## 2016-06-03 DIAGNOSIS — M7989 Other specified soft tissue disorders: Secondary | ICD-10-CM

## 2016-06-03 DIAGNOSIS — R6521 Severe sepsis with septic shock: Secondary | ICD-10-CM | POA: Diagnosis not present

## 2016-06-03 DIAGNOSIS — R911 Solitary pulmonary nodule: Secondary | ICD-10-CM | POA: Diagnosis present

## 2016-06-03 DIAGNOSIS — M549 Dorsalgia, unspecified: Secondary | ICD-10-CM | POA: Diagnosis not present

## 2016-06-03 DIAGNOSIS — M6281 Muscle weakness (generalized): Secondary | ICD-10-CM | POA: Diagnosis not present

## 2016-06-03 DIAGNOSIS — I959 Hypotension, unspecified: Secondary | ICD-10-CM | POA: Diagnosis not present

## 2016-06-03 DIAGNOSIS — G64 Other disorders of peripheral nervous system: Secondary | ICD-10-CM | POA: Diagnosis not present

## 2016-06-03 DIAGNOSIS — G629 Polyneuropathy, unspecified: Secondary | ICD-10-CM | POA: Diagnosis not present

## 2016-06-03 DIAGNOSIS — R000 Tachycardia, unspecified: Secondary | ICD-10-CM

## 2016-06-03 DIAGNOSIS — S2221XA Fracture of manubrium, initial encounter for closed fracture: Secondary | ICD-10-CM | POA: Diagnosis not present

## 2016-06-03 DIAGNOSIS — I700 Atherosclerosis of aorta: Secondary | ICD-10-CM | POA: Diagnosis present

## 2016-06-03 DIAGNOSIS — J9811 Atelectasis: Secondary | ICD-10-CM

## 2016-06-03 DIAGNOSIS — J151 Pneumonia due to Pseudomonas: Secondary | ICD-10-CM | POA: Diagnosis not present

## 2016-06-03 DIAGNOSIS — R2689 Other abnormalities of gait and mobility: Secondary | ICD-10-CM | POA: Diagnosis not present

## 2016-06-03 DIAGNOSIS — G610 Guillain-Barre syndrome: Secondary | ICD-10-CM | POA: Diagnosis not present

## 2016-06-03 DIAGNOSIS — R296 Repeated falls: Secondary | ICD-10-CM | POA: Diagnosis not present

## 2016-06-03 DIAGNOSIS — R270 Ataxia, unspecified: Secondary | ICD-10-CM

## 2016-06-03 DIAGNOSIS — R0603 Acute respiratory distress: Secondary | ICD-10-CM | POA: Diagnosis not present

## 2016-06-03 DIAGNOSIS — E1143 Type 2 diabetes mellitus with diabetic autonomic (poly)neuropathy: Secondary | ICD-10-CM | POA: Diagnosis not present

## 2016-06-03 DIAGNOSIS — G92 Toxic encephalopathy: Secondary | ICD-10-CM | POA: Diagnosis not present

## 2016-06-03 DIAGNOSIS — Z79899 Other long term (current) drug therapy: Secondary | ICD-10-CM

## 2016-06-03 DIAGNOSIS — N390 Urinary tract infection, site not specified: Secondary | ICD-10-CM | POA: Diagnosis not present

## 2016-06-03 DIAGNOSIS — G8929 Other chronic pain: Secondary | ICD-10-CM | POA: Diagnosis not present

## 2016-06-03 DIAGNOSIS — Z515 Encounter for palliative care: Secondary | ICD-10-CM

## 2016-06-03 DIAGNOSIS — N179 Acute kidney failure, unspecified: Secondary | ICD-10-CM | POA: Diagnosis present

## 2016-06-03 DIAGNOSIS — R740 Nonspecific elevation of levels of transaminase and lactic acid dehydrogenase [LDH]: Secondary | ICD-10-CM | POA: Diagnosis not present

## 2016-06-03 DIAGNOSIS — Z792 Long term (current) use of antibiotics: Secondary | ICD-10-CM | POA: Diagnosis not present

## 2016-06-03 DIAGNOSIS — L8990 Pressure ulcer of unspecified site, unspecified stage: Secondary | ICD-10-CM | POA: Insufficient documentation

## 2016-06-03 DIAGNOSIS — K922 Gastrointestinal hemorrhage, unspecified: Secondary | ICD-10-CM | POA: Diagnosis not present

## 2016-06-03 DIAGNOSIS — R918 Other nonspecific abnormal finding of lung field: Secondary | ICD-10-CM | POA: Diagnosis not present

## 2016-06-03 DIAGNOSIS — Z66 Do not resuscitate: Secondary | ICD-10-CM | POA: Diagnosis not present

## 2016-06-03 DIAGNOSIS — R29810 Facial weakness: Secondary | ICD-10-CM | POA: Diagnosis present

## 2016-06-03 DIAGNOSIS — R0989 Other specified symptoms and signs involving the circulatory and respiratory systems: Secondary | ICD-10-CM | POA: Diagnosis not present

## 2016-06-03 DIAGNOSIS — G039 Meningitis, unspecified: Secondary | ICD-10-CM | POA: Diagnosis not present

## 2016-06-03 DIAGNOSIS — Z9911 Dependence on respirator [ventilator] status: Secondary | ICD-10-CM | POA: Diagnosis not present

## 2016-06-03 DIAGNOSIS — I10 Essential (primary) hypertension: Secondary | ICD-10-CM | POA: Diagnosis not present

## 2016-06-03 DIAGNOSIS — R600 Localized edema: Secondary | ICD-10-CM | POA: Diagnosis not present

## 2016-06-03 DIAGNOSIS — I5033 Acute on chronic diastolic (congestive) heart failure: Secondary | ICD-10-CM | POA: Diagnosis not present

## 2016-06-03 DIAGNOSIS — G909 Disorder of the autonomic nervous system, unspecified: Secondary | ICD-10-CM | POA: Diagnosis not present

## 2016-06-03 DIAGNOSIS — N312 Flaccid neuropathic bladder, not elsewhere classified: Secondary | ICD-10-CM | POA: Diagnosis not present

## 2016-06-03 DIAGNOSIS — A4102 Sepsis due to Methicillin resistant Staphylococcus aureus: Secondary | ICD-10-CM | POA: Diagnosis not present

## 2016-06-03 DIAGNOSIS — E7800 Pure hypercholesterolemia, unspecified: Secondary | ICD-10-CM | POA: Diagnosis present

## 2016-06-03 DIAGNOSIS — B952 Enterococcus as the cause of diseases classified elsewhere: Secondary | ICD-10-CM | POA: Diagnosis present

## 2016-06-03 DIAGNOSIS — Z885 Allergy status to narcotic agent status: Secondary | ICD-10-CM

## 2016-06-03 DIAGNOSIS — R4182 Altered mental status, unspecified: Secondary | ICD-10-CM | POA: Diagnosis not present

## 2016-06-03 DIAGNOSIS — G9340 Encephalopathy, unspecified: Secondary | ICD-10-CM

## 2016-06-03 DIAGNOSIS — R339 Retention of urine, unspecified: Secondary | ICD-10-CM | POA: Diagnosis not present

## 2016-06-03 DIAGNOSIS — S8001XA Contusion of right knee, initial encounter: Secondary | ICD-10-CM | POA: Diagnosis not present

## 2016-06-03 DIAGNOSIS — M869 Osteomyelitis, unspecified: Secondary | ICD-10-CM | POA: Diagnosis present

## 2016-06-03 DIAGNOSIS — J9690 Respiratory failure, unspecified, unspecified whether with hypoxia or hypercapnia: Secondary | ICD-10-CM

## 2016-06-03 DIAGNOSIS — R7989 Other specified abnormal findings of blood chemistry: Secondary | ICD-10-CM | POA: Diagnosis not present

## 2016-06-03 DIAGNOSIS — E870 Hyperosmolality and hypernatremia: Secondary | ICD-10-CM | POA: Diagnosis not present

## 2016-06-03 DIAGNOSIS — R471 Dysarthria and anarthria: Secondary | ICD-10-CM | POA: Diagnosis present

## 2016-06-03 DIAGNOSIS — W19XXXA Unspecified fall, initial encounter: Secondary | ICD-10-CM

## 2016-06-03 DIAGNOSIS — K311 Adult hypertrophic pyloric stenosis: Secondary | ICD-10-CM | POA: Diagnosis not present

## 2016-06-03 DIAGNOSIS — K209 Esophagitis, unspecified: Secondary | ICD-10-CM | POA: Diagnosis present

## 2016-06-03 DIAGNOSIS — E440 Moderate protein-calorie malnutrition: Secondary | ICD-10-CM | POA: Diagnosis not present

## 2016-06-03 DIAGNOSIS — H499 Unspecified paralytic strabismus: Secondary | ICD-10-CM | POA: Diagnosis present

## 2016-06-03 DIAGNOSIS — J9600 Acute respiratory failure, unspecified whether with hypoxia or hypercapnia: Secondary | ICD-10-CM | POA: Diagnosis not present

## 2016-06-03 DIAGNOSIS — Z4682 Encounter for fitting and adjustment of non-vascular catheter: Secondary | ICD-10-CM | POA: Diagnosis not present

## 2016-06-03 DIAGNOSIS — G9341 Metabolic encephalopathy: Secondary | ICD-10-CM | POA: Diagnosis not present

## 2016-06-03 DIAGNOSIS — Z880 Allergy status to penicillin: Secondary | ICD-10-CM

## 2016-06-03 DIAGNOSIS — Z981 Arthrodesis status: Secondary | ICD-10-CM

## 2016-06-03 DIAGNOSIS — R269 Unspecified abnormalities of gait and mobility: Secondary | ICD-10-CM

## 2016-06-03 DIAGNOSIS — R001 Bradycardia, unspecified: Secondary | ICD-10-CM | POA: Diagnosis not present

## 2016-06-03 DIAGNOSIS — Z7982 Long term (current) use of aspirin: Secondary | ICD-10-CM

## 2016-06-03 DIAGNOSIS — T07XXXA Unspecified multiple injuries, initial encounter: Secondary | ICD-10-CM

## 2016-06-03 DIAGNOSIS — Z91040 Latex allergy status: Secondary | ICD-10-CM

## 2016-06-03 DIAGNOSIS — R29898 Other symptoms and signs involving the musculoskeletal system: Secondary | ICD-10-CM

## 2016-06-03 DIAGNOSIS — R338 Other retention of urine: Secondary | ICD-10-CM

## 2016-06-03 DIAGNOSIS — F17210 Nicotine dependence, cigarettes, uncomplicated: Secondary | ICD-10-CM | POA: Diagnosis present

## 2016-06-03 DIAGNOSIS — J9621 Acute and chronic respiratory failure with hypoxia: Secondary | ICD-10-CM | POA: Diagnosis not present

## 2016-06-03 DIAGNOSIS — R1030 Lower abdominal pain, unspecified: Secondary | ICD-10-CM | POA: Diagnosis present

## 2016-06-03 DIAGNOSIS — E1169 Type 2 diabetes mellitus with other specified complication: Secondary | ICD-10-CM | POA: Diagnosis not present

## 2016-06-03 DIAGNOSIS — R260 Ataxic gait: Secondary | ICD-10-CM | POA: Diagnosis not present

## 2016-06-03 DIAGNOSIS — S9032XA Contusion of left foot, initial encounter: Secondary | ICD-10-CM | POA: Diagnosis not present

## 2016-06-03 DIAGNOSIS — J449 Chronic obstructive pulmonary disease, unspecified: Secondary | ICD-10-CM | POA: Diagnosis present

## 2016-06-03 DIAGNOSIS — S8002XA Contusion of left knee, initial encounter: Secondary | ICD-10-CM | POA: Diagnosis not present

## 2016-06-03 DIAGNOSIS — M62838 Other muscle spasm: Secondary | ICD-10-CM | POA: Diagnosis present

## 2016-06-03 DIAGNOSIS — F331 Major depressive disorder, recurrent, moderate: Secondary | ICD-10-CM | POA: Diagnosis not present

## 2016-06-03 DIAGNOSIS — S3991XA Unspecified injury of abdomen, initial encounter: Secondary | ICD-10-CM | POA: Diagnosis not present

## 2016-06-03 DIAGNOSIS — M79672 Pain in left foot: Secondary | ICD-10-CM

## 2016-06-03 HISTORY — DX: Ataxia, unspecified: R27.0

## 2016-06-03 LAB — BASIC METABOLIC PANEL
ANION GAP: 10 (ref 5–15)
BUN: 41 mg/dL — ABNORMAL HIGH (ref 6–20)
CALCIUM: 9 mg/dL (ref 8.9–10.3)
CO2: 25 mmol/L (ref 22–32)
Chloride: 95 mmol/L — ABNORMAL LOW (ref 101–111)
Creatinine, Ser: 1.72 mg/dL — ABNORMAL HIGH (ref 0.61–1.24)
GFR, EST AFRICAN AMERICAN: 46 mL/min — AB (ref >60)
GFR, EST NON AFRICAN AMERICAN: 40 mL/min — AB (ref >60)
Glucose, Bld: 115 mg/dL — ABNORMAL HIGH (ref 65–99)
POTASSIUM: 3.8 mmol/L (ref 3.5–5.1)
SODIUM: 130 mmol/L — AB (ref 135–145)

## 2016-06-03 LAB — I-STAT CG4 LACTIC ACID, ED
LACTIC ACID, VENOUS: 2.28 mmol/L — AB (ref 0.5–1.9)
Lactic Acid, Venous: 1.18 mmol/L (ref 0.5–1.9)

## 2016-06-03 LAB — URINALYSIS, ROUTINE W REFLEX MICROSCOPIC
Bilirubin Urine: NEGATIVE
GLUCOSE, UA: 50 mg/dL — AB
Ketones, ur: 5 mg/dL — AB
Nitrite: NEGATIVE
Protein, ur: 100 mg/dL — AB
SPECIFIC GRAVITY, URINE: 1.015 (ref 1.005–1.030)
pH: 5 (ref 5.0–8.0)

## 2016-06-03 LAB — CBC WITH DIFFERENTIAL/PLATELET
BASOS ABS: 0 10*3/uL (ref 0.0–0.1)
BASOS PCT: 0 %
EOS ABS: 0 10*3/uL (ref 0.0–0.7)
EOS PCT: 0 %
HCT: 37.2 % — ABNORMAL LOW (ref 39.0–52.0)
Hemoglobin: 13.3 g/dL (ref 13.0–17.0)
LYMPHS PCT: 4 %
Lymphs Abs: 0.7 10*3/uL (ref 0.7–4.0)
MCH: 32.4 pg (ref 26.0–34.0)
MCHC: 35.8 g/dL (ref 30.0–36.0)
MCV: 90.7 fL (ref 78.0–100.0)
Monocytes Absolute: 1.5 10*3/uL — ABNORMAL HIGH (ref 0.1–1.0)
Monocytes Relative: 8 %
Neutro Abs: 17.2 10*3/uL — ABNORMAL HIGH (ref 1.7–7.7)
Neutrophils Relative %: 88 %
PLATELETS: 339 10*3/uL (ref 150–400)
RBC: 4.1 MIL/uL — ABNORMAL LOW (ref 4.22–5.81)
RDW: 13.5 % (ref 11.5–15.5)
WBC: 19.4 10*3/uL — ABNORMAL HIGH (ref 4.0–10.5)

## 2016-06-03 MED ORDER — ACETAMINOPHEN 325 MG PO TABS
650.0000 mg | ORAL_TABLET | Freq: Four times a day (QID) | ORAL | Status: DC | PRN
  Administered 2016-06-07: 650 mg via ORAL
  Filled 2016-06-03: qty 2

## 2016-06-03 MED ORDER — TAMSULOSIN HCL 0.4 MG PO CAPS
0.4000 mg | ORAL_CAPSULE | Freq: Every day | ORAL | Status: DC
  Administered 2016-06-03 – 2016-06-13 (×10): 0.4 mg via ORAL
  Filled 2016-06-03 (×10): qty 1

## 2016-06-03 MED ORDER — ONDANSETRON HCL 4 MG/2ML IJ SOLN: 4.0000 mg | Freq: Four times a day (QID) | INTRAMUSCULAR | Status: DC | PRN

## 2016-06-03 MED ORDER — DIAZEPAM 5 MG PO TABS
10.0000 mg | ORAL_TABLET | Freq: Four times a day (QID) | ORAL | Status: DC | PRN
  Administered 2016-06-07 (×2): 10 mg via ORAL
  Filled 2016-06-03 (×2): qty 2

## 2016-06-03 MED ORDER — ASPIRIN EC 81 MG PO TBEC
81.0000 mg | DELAYED_RELEASE_TABLET | Freq: Every day | ORAL | Status: DC
  Administered 2016-06-04 – 2016-06-07 (×4): 81 mg via ORAL
  Filled 2016-06-03 (×5): qty 1

## 2016-06-03 MED ORDER — SODIUM CHLORIDE 0.9 % IV BOLUS (SEPSIS)
1700.0000 mL | Freq: Once | INTRAVENOUS | Status: AC
  Administered 2016-06-03: 1700 mL via INTRAVENOUS

## 2016-06-03 MED ORDER — DEXTROSE 5 % IV SOLN
2.0000 g | Freq: Once | INTRAVENOUS | Status: AC
  Administered 2016-06-03: 2 g via INTRAVENOUS
  Filled 2016-06-03: qty 2

## 2016-06-03 MED ORDER — ONDANSETRON HCL 4 MG PO TABS: 4.0000 mg | ORAL_TABLET | Freq: Four times a day (QID) | ORAL | Status: DC | PRN

## 2016-06-03 MED ORDER — SODIUM CHLORIDE 0.9 % IV BOLUS (SEPSIS)
500.0000 mL | Freq: Once | INTRAVENOUS | Status: AC
  Administered 2016-06-03: 500 mL via INTRAVENOUS

## 2016-06-03 MED ORDER — FLUTICASONE PROPIONATE 50 MCG/ACT NA SUSP
1.0000 | Freq: Every day | NASAL | Status: DC | PRN
  Administered 2016-06-06: 1 via NASAL
  Filled 2016-06-03: qty 16

## 2016-06-03 MED ORDER — ACETAMINOPHEN 650 MG RE SUPP
650.0000 mg | Freq: Four times a day (QID) | RECTAL | Status: DC | PRN
  Administered 2016-06-17 – 2016-06-18 (×2): 650 mg via RECTAL
  Filled 2016-06-03 (×2): qty 1

## 2016-06-03 MED ORDER — HEPARIN SODIUM (PORCINE) 5000 UNIT/ML IJ SOLN
5000.0000 [IU] | Freq: Three times a day (TID) | INTRAMUSCULAR | Status: DC
  Administered 2016-06-03 – 2016-06-08 (×13): 5000 [IU] via SUBCUTANEOUS
  Filled 2016-06-03 (×13): qty 1

## 2016-06-03 MED ORDER — PREGABALIN 75 MG PO CAPS
75.0000 mg | ORAL_CAPSULE | Freq: Two times a day (BID) | ORAL | Status: DC
  Administered 2016-06-03 – 2016-06-08 (×11): 75 mg via ORAL
  Filled 2016-06-03 (×12): qty 1

## 2016-06-03 MED ORDER — CYCLOBENZAPRINE HCL 10 MG PO TABS
10.0000 mg | ORAL_TABLET | Freq: Three times a day (TID) | ORAL | Status: DC | PRN
  Administered 2016-06-04: 10 mg via ORAL
  Filled 2016-06-03: qty 1

## 2016-06-03 MED ORDER — LEVOFLOXACIN IN D5W 500 MG/100ML IV SOLN
500.0000 mg | INTRAVENOUS | Status: DC
  Administered 2016-06-03 – 2016-06-06 (×4): 500 mg via INTRAVENOUS
  Filled 2016-06-03 (×4): qty 100

## 2016-06-03 MED ORDER — SODIUM CHLORIDE 0.9 % IV SOLN
INTRAVENOUS | Status: AC
  Administered 2016-06-03 – 2016-06-05 (×5): via INTRAVENOUS

## 2016-06-03 NOTE — Progress Notes (Signed)
Pharmacy Antibiotic Note  Marc Rose is a 66 y.o. male admitted on 06/03/2016 with UTI.  Pharmacy has been consulted for Levaquin dosing.  Plan: Levaquin 500mg  IV q4h F/U cultures and clinical progress Monitor V/S and labs  Height: 6\' 2"  (188 cm) Weight: 172 lb (78 kg) IBW/kg (Calculated) : 82.2  Temp (24hrs), Avg:97.6 F (36.4 C), Min:97.4 F (36.3 C), Max:97.7 F (36.5 C)   Recent Labs Lab 06/03/16 1040 06/03/16 1136 06/03/16 1444  WBC 19.4*  --   --   CREATININE 1.72*  --   --   LATICACIDVEN  --  2.28* 1.18    Estimated Creatinine Clearance: 47.2 mL/min (by C-G formula based on SCr of 1.72 mg/dL (H)).    Allergies  Allergen Reactions  . Codeine Anaphylaxis, Hives and Swelling  . Penicillins Anaphylaxis, Hives and Swelling  . Latex Itching  . Strawberry Extract Hives    Antimicrobials this admission: levaquin 2/1 >>  Ceftriaxone x 1 dose 2/1  Microbiology results: 2/1 UCx: pending  1/26 UCx: E. Faecalis s- amp,levaquin, vanc  Thank you for allowing pharmacy to be a part of this patient's care.  Isac Sarna, BS [place], [place] Clinical Pharmacist Pager [phone] 06/03/2016 3:31 PM

## 2016-06-03 NOTE — H&P (Signed)
History and Physical    [address] [id number] DOB: 04/23/1951 DOA: 06/03/2016  PCP: [name] Patient   Patient coming from: Home   Chief Complaint: Lower abdominal pain and lower extremity weakness.   HPI: Marc Rose is a 66 y.o. male with medical history significant of urinary retention, self bladder catheterization since 2014. For last 4 weeks patient has been developing worsening lower extremity weakness that has resulted in multiple falls and traumatic skin lesions in his feet and knees. Last night he developed lower abdominal pain, severe in intensity, no improving or worsening factors, associated with a fullness sensation in his lower abdomen and the perception of incomplete bladder emptying. The symptoms occurred while he was trying to empty his bladder using a urinary catheter. He reports being careful in the cleaning of his urinary catheter.  He has had multiple surgeries on his left lower extremity. Patient has been on chronic doxycycline for chronic discitis and osteomyelitis since 2015. Patient has a urine analysis positive for infection from January 26 with culture positive for Enterococcus faecalis.   ED Course: Patient was found to be very weak, multiple traumatic skin lesions related to falls. Referred for admission for treatment of the urinary tract infection.   Review of Systems:    Past Medical History:  Diagnosis Date  . Anxiety state, unspecified   . Arthritis   . Benign neoplasm of colon   . Causalgia of upper limb    Left limb  . Chronic back pain   . Chronic leg pain   . Depression   . Diabetes mellitus   . Diskitis 02/05/2015  . Drug rash 11/12/2015  . Elevated blood pressure reading without diagnosis of hypertension   . Essential hypertension, benign   . Hardware complicating wound infection ([place]) 02/05/2015  . Hyperpigmentation 05/15/2015  . Hypertension   . Impaired fasting glucose   . Peripheral arterial disease ([place])   . Pure  hypercholesterolemia   . Tobacco use disorder   . Unspecified retinal detachment     Past Surgical History:  Procedure Laterality Date  . ANKLE SURGERY     left  . APPENDECTOMY    . BACK SURGERY    . COLONOSCOPY    . KNEE SURGERY     left     reports that he has been smoking Cigarettes.  He has a 19.50 pack-year smoking history. He has never used smokeless tobacco. He reports that he uses drugs, including Marijuana. He reports that he does not drink alcohol.  Allergies  Allergen Reactions  . Codeine Anaphylaxis, Hives and Swelling  . Penicillins Anaphylaxis, Hives and Swelling  . Latex Itching  . Strawberry Extract Hives    Family History  Problem Relation Age of Onset  . COPD Mother   . Hyperthyroidism Mother   . Hyperthyroidism Sister    Unacceptable: Noncontributory, unremarkable, or negative. Acceptable: Family history reviewed and not pertinent (If you reviewed it)  Prior to Admission medications   Medication Sig Start Date End Date Taking? Authorizing Provider  aspirin EC 81 MG tablet Take 81 mg by mouth daily.   Yes Historical Provider, MD  benazepril (LOTENSIN) 40 MG tablet Take 40 mg by mouth daily.   Yes Historical Provider, MD  cyclobenzaprine (FLEXERIL) 10 MG tablet Take 1 tablet (10 mg total) by mouth 3 (three) times daily as needed for muscle spasms. 09/19/13  Yes Adeline C Viyuoh, MD  diazepam (VALIUM) 10 MG tablet Take 0.5 tablets (5 mg total)  by mouth every 6 (six) hours as needed for anxiety (back spasms). Patient taking differently: Take 10 mg by mouth 4 (four) times daily as needed for anxiety (back spasms).  09/19/13  Yes Adeline Saralyn Pilar, MD  doxycycline (VIBRA-TABS) 100 MG tablet Take 1 tablet (100 mg total) by mouth 2 (two) times daily. 11/27/15  Yes Truman Hayward, MD  fluticasone [facility]) 50 MCG/ACT nasal spray Place 1 spray into the nose daily as needed for allergies.  07/04/14  Yes Historical Provider, MD  NEOMYCIN-POLYMYXIN-HYDROCORTISONE  (CORTISPORIN) 1 % SOLN otic solution Place 2 drops in ear(s) daily. Reported on 11/12/2015 07/04/14  Yes Historical Provider, MD  pregabalin (LYRICA) 75 MG capsule Take 75 mg by mouth 2 (two) times daily.  05/07/13  Yes Historical Provider, MD  tamsulosin (FLOMAX) 0.4 MG CAPS capsule Take 0.4 mg by mouth.   Yes Historical Provider, MD    Physical Exam: Vitals:   06/03/16 0946 06/03/16 0948 06/03/16 1130 06/03/16 1200  BP:  130/83 169/77 125/93  Pulse:  74    Resp:  18 23 (!) 31  Temp:  97.7 F (36.5 C)    TempSrc:  Oral    SpO2:  95%    Weight: 74.8 kg (165 lb)     Height: 6' 2.5" (1.892 m)         Constitutional: deconditioned and ill looking appearing.  Vitals:   06/03/16 0946 06/03/16 0948 06/03/16 1130 06/03/16 1200  BP:  130/83 169/77 125/93  Pulse:  74    Resp:  18 23 (!) 31  Temp:  97.7 F (36.5 C)    TempSrc:  Oral    SpO2:  95%    Weight: 74.8 kg (165 lb)     Height: 6' 2.5" (1.892 m)      Eyes: PERRL, lids and conjunctivae normal Head normocephalic, nose and ears with no deformities.  ENMT: Mucous membranes are dry. Posterior pharynx clear of any exudate or lesions.Normal dentition.  Neck: normal, supple, no masses, no thyromegaly Respiratory: clear to auscultation bilaterally, no wheezing, no crackles. Normal respiratory effort. No accessory muscle use. Mild decreased breath sounds at bases.  Cardiovascular: Regular rate and rhythm, no murmurs / rubs / gallops. No extremity edema. 2+ pedal pulses. No carotid bruits.  Abdomen: no tenderness, no masses palpated. No hepatosplenomegaly. Bowel sounds positive.  Musculoskeletal: no clubbing / cyanosis. No joint deformity upper and lower extremities. Good ROM, no contractures. Normal muscle tone.  Skin: no rashes, lesions, ulcers. No induration. Multiple skin traumatic skin lesions to feet and knees.  Neurologic: CN 2-12 grossly intact. Sensation intact, DTR normal. Strength 5/5 in all 4.       Labs on Admission: I have  personally reviewed following labs and imaging studies  CBC:  Recent Labs Lab 06/03/16 1040  WBC 19.4*  NEUTROABS 17.2*  HGB 13.3  HCT 37.2*  MCV 90.7  PLT [id number]   Basic Metabolic Panel:  Recent Labs Lab 06/03/16 1040  NA 130*  K 3.8  CL 95*  CO2 25  GLUCOSE 115*  BUN 41*  CREATININE 1.72*  CALCIUM 9.0   GFR: Estimated Creatinine Clearance: 45.3 mL/min (by C-G formula based on SCr of 1.72 mg/dL (H)). Liver Function Tests: No results for input(s): AST, ALT, ALKPHOS, BILITOT, PROT, ALBUMIN in the last 168 hours. No results for input(s): LIPASE, AMYLASE in the last 168 hours. No results for input(s): AMMONIA in the last 168 hours. Coagulation Profile: No results for input(s): INR, PROTIME in  the last 168 hours. Cardiac Enzymes: No results for input(s): CKTOTAL, CKMB, CKMBINDEX, TROPONINI in the last 168 hours. BNP (last 3 results) No results for input(s): PROBNP in the last 8760 hours. HbA1C: No results for input(s): HGBA1C in the last 72 hours. CBG: No results for input(s): GLUCAP in the last 168 hours. Lipid Profile: No results for input(s): CHOL, HDL, LDLCALC, TRIG, CHOLHDL, LDLDIRECT in the last 72 hours. Thyroid Function Tests: No results for input(s): TSH, T4TOTAL, FREET4, T3FREE, THYROIDAB in the last 72 hours. Anemia Panel: No results for input(s): VITAMINB12, FOLATE, FERRITIN, TIBC, IRON, RETICCTPCT in the last 72 hours. Urine analysis:    Component Value Date/Time   COLORURINE AMBER (A) 05/28/2016 1440   APPEARANCEUR HAZY (A) 05/28/2016 1440   LABSPEC 1.019 05/28/2016 1440   PHURINE 5.0 05/28/2016 1440   GLUCOSEU NEGATIVE 05/28/2016 1440   HGBUR MODERATE (A) 05/28/2016 1440   BILIRUBINUR NEGATIVE 05/28/2016 1440   KETONESUR NEGATIVE 05/28/2016 1440   PROTEINUR 100 (A) 05/28/2016 1440   UROBILINOGEN 1.0 09/16/2013 2001   NITRITE NEGATIVE 05/28/2016 1440   LEUKOCYTESUR SMALL (A) 05/28/2016 1440   Sepsis Labs:  !!!!!!!!!!!!!!!!!!!!!!!!!!!!!!!!!!!!!!!!!!!! @LABRCNTIP (procalcitonin:4,lacticidven:4) ) Recent Results (from the past 240 hour(s))  Culture, Urine     Status: Abnormal   Collection Time: 05/28/16  2:41 PM  Result Value Ref Range Status   Specimen Description URINE, RANDOM  Final   Special Requests NONE  Final   Culture 80,000 COLONIES/mL ENTEROCOCCUS FAECALIS (A)  Final   Report Status 05/31/2016 FINAL  Final   Organism ID, Bacteria ENTEROCOCCUS FAECALIS (A)  Final      Susceptibility   Enterococcus faecalis - MIC*    AMPICILLIN <=2 SENSITIVE Sensitive     LEVOFLOXACIN 1 SENSITIVE Sensitive     NITROFURANTOIN <=16 SENSITIVE Sensitive     VANCOMYCIN 1 SENSITIVE Sensitive     * 80,000 COLONIES/mL ENTEROCOCCUS FAECALIS     Radiological Exams on Admission: Dg Chest 1 View  Result Date: 06/03/2016 CLINICAL DATA:  Increased weakness x 2 weeks. Multiple falls. Hx of smoking and htn. EXAM: CHEST 1 VIEW COMPARISON:  09/16/2013 FINDINGS: There is a well demarcated opacity projecting in the left mid to lower lung, new since the prior exam. There are lung markings at extending peripheral to this. This does not appear to be a pneumothorax although potentially could reflect partial collapse of the lingular segment of the left upper lobe. This could reflect a pleural based opacity. Remainder of the lungs is clear.  Lungs are mildly hyperexpanded. No pleural effusion.  No pneumothorax. Cardiac silhouette is normal in size. No mediastinal or hilar masses or convincing adenopathy. Posterior fusion from the lower thoracic to the lumbar spine, incompletely imaged, is new since the prior study. IMPRESSION: 1. Opacity projecting in the left mid to lower lung, new since the prior exam. Although new since the prior study, this still may reflect a chronic finding. This is unlikely to reflect pneumonia. This could be further assessed with either chest CT or PA and lateral views of the chest. 2. No other lung  opacities. Electronically Signed   By: Lajean Manes M.D.   On: 06/03/2016 11:22   Dg Foot Complete Left  Result Date: 06/03/2016 CLINICAL DATA:  Increased weakness x 2 weeks. Has hard time walking, multiple falls. Pt says that he has been crawling around the house the last few days due to inability to walk. Pain and bruising lt foot. Pt held for imaging. Hx of surgery on the left  knee and ankle. He has had problems since the surgery. EXAM: LEFT FOOT - COMPLETE 3+ VIEW COMPARISON:  None. FINDINGS: No fracture.  No bone lesion. The joints are normally aligned. Small amount of calcification at the base of the plantar fascia adjacent to the calcaneal tuberosity. Soft tissues are otherwise unremarkable. IMPRESSION: No fracture, bone lesion or significant joint abnormality. No acute finding. Electronically Signed   By: Lajean Manes M.D.   On: 06/03/2016 11:24    EKG: Independently reviewed. Normal sinus rhythm, no ST elevations, no ST depressions, no significant T wave abnormalities.   Assessment/Plan Active Problems:   * No active hospital problems. *  This is a 66 year old male with significant history for chronic urinary retention, self catheterization since 2014 and ambulatory dysfunction. Developed symptoms of acute urinary retention despite self-catheterization, complicated by worsening weakness and multiple falls. On initial physical examination, temperature 97.7, blood pressure 130/83, heart rate 74, respiratory rate 18, oxygen saturation 95% on room air. His oral mucosa is dry, his lungs are clear to auscultation, heart S1-S2 present rhythmic, his abdomen is soft, nontender, Foley catheter has been placed. Lower extremity with no edema. Skin with multiple traumatic lesions at the feet and knees. Sodium 130, potassium 3.8, chloride 95, bicarbonate 25, glucose 115, BUN 41, creatinine 1.72, white count 19.4, hemoglobin 13.3, hematocrit 37.2, platelets 339. Urine was cloudy, large leukocyte esterase,  protein 100. Urine culture from 05/29/2015, Enterococcus faecalis 80,000 colony-forming units, sensitive to ampicillin, levofloxacin, vancomycin and nitrofurantoin. Chest film, personally review, rotation to the left side, opacity at the left base possibly lingular atelectasis.   Working diagnosis. Enterococcus faecalis urinary tract infection complicated by acute kidney injury and generalized weakness with ambulatory dysfunction and lingular atelectasis.  1. Urinary tract infection. Will start antibiotic therapy with levofloxacin, patient is allergic to penicillins, (reaction: hives and anaphylaxis). Will continue supportive IV fluids, pain control. Will follow-up on new culture. As part of the workup will do a CT noncontrast of the abdomen and pelvis, rule out obstructive uropathy. Will follow cell count and temperature curve. Continue Flomax.   2. Acute kidney injury. Will continue IV fluids with isotonic saline at 100 mL per hour, will follow-up kidney function in the morning, avoid hypotension and nephrotoxic agents. Follow urine output.  3. Ambulatory dysfunction. Physical therapy evaluation, out of bed as tolerated. Patient may need home health services at discharge. Continue Lyrica and Flexeril. Hold doxycycline for now.   4. Hypertension. Will hold on an as approval to avoid further hypotension or kidney injury.   5. Lingular atelectasis. Will order incentive spirometer and will follow chest film in am.   DVT prophylaxis: enoxaparin  Code Status: Full  Family Communication: No family at bedside.  Disposition Plan: Home with home health.  Consults called:  Admission status: inpatient   Tawni Millers MD [facility] Pager [phone]  If 7PM-7AM, please contact night-coverage [email or web address] Password [email or web address]  06/03/2016, 1:21 PM

## 2016-06-03 NOTE — ED Provider Notes (Signed)
[facility] DEPT Provider Note   CSN: [id number] Arrival date & time: 06/03/16  [id number]  By signing my name below, I, Hilbert Odor, attest that this documentation has been prepared under the direction and in the presence of Daleen Bo, MD. Electronically Signed: Hilbert Odor, Scribe. 06/03/16. 10:40 AM. History   Chief Complaint Chief Complaint  Patient presents with  . Weakness    The history is provided by the patient. No language interpreter was used.   HPI Comments: Marc Rose is a 66 y.o. male brought in by ambulance, who presents to the Emergency Department complaining of worsening generalized weakness for the past 2 weeks. Per triage: He reports fatigue and poor appetite. The patient also reports left sided CP for the past 3 months. The patient states that he has been crawling around his house for the past couple of days due to the inability to walk. He denies sneezing and coughing. He has a hx of smoking. He also reports a hx of frequent falls. He states that he doesn't smoke every day. He lives alone. He states that he hasn't been compliant with his medications for the past 2 days due to his weakness.  He also states that he has had problems with his legs and feet in the past. He reports pain in both feet but the pain is worse in his left foot that has been present for a long time. He reports that he has not had the ability to fully control his left leg since 2004 and he hasn't been able to walk correctly since 2014. He denies fever, chills, nausea, vomiting, dysuria, or diarrhea. There are no other known modifying factors.  Past Medical History:  Diagnosis Date  . Anxiety state, unspecified   . Arthritis   . Benign neoplasm of colon   . Causalgia of upper limb    Left limb  . Chronic back pain   . Chronic leg pain   . Depression   . Diabetes mellitus   . Diskitis 02/05/2015  . Drug rash 11/12/2015  . Elevated blood pressure reading without diagnosis of  hypertension   . Essential hypertension, benign   . Hardware complicating wound infection ([place]) 02/05/2015  . Hyperpigmentation 05/15/2015  . Hypertension   . Impaired fasting glucose   . Peripheral arterial disease ([place])   . Pure hypercholesterolemia   . Tobacco use disorder   . Unspecified retinal detachment     Patient Active Problem List   Diagnosis Date Noted  . Drug rash 11/12/2015  . Hyperpigmentation 05/15/2015  . Abdominal pain, generalized 02/05/2015  . Abdominal pain, right lower quadrant 02/05/2015  . Abscess, psoas ([place]) 02/05/2015  . Acute upper respiratory infection 02/05/2015  . Long term current use of antibiotics 02/05/2015  . Benign essential HTN 02/05/2015  . Edema leg 02/05/2015  . Complex regional pain syndrome type 2 of upper extremity 02/05/2015  . Back pain, chronic 02/05/2015  . CN (constipation) 02/05/2015  . CD (contact dermatitis) 02/05/2015  . Elevated fasting blood sugar 02/05/2015  . LBP (low back pain) 02/05/2015  . Discitis of lumbar region 02/05/2015  . Disorder of nutrition 02/05/2015  . Feeling bilious 02/05/2015  . Flu vaccine need 02/05/2015  . Peripheral neuropathic pain 02/05/2015  . Non compliance with medical treatment 02/05/2015  . Abnormal blood chemistry 02/05/2015  . Angiopathy, peripheral ([place]) 02/05/2015  . Nerve disease, peripheral ([place]) 02/05/2015  . Hypercholesterolemia without hypertriglyceridemia 02/05/2015  . Spinal stenosis 02/05/2015  . Buzzing in ear 02/05/2015  .  Compulsive tobacco user syndrome 02/05/2015  . Bladder retention 02/05/2015  . Abnormal weight loss 02/05/2015  . Diskitis 02/05/2015  . Hardware complicating wound infection ([place]) 02/05/2015  . Hyperlipidemia 07/05/2014  . Peripheral arterial disease ([place]) 07/05/2014  . Hypotension 09/16/2013  . HTN (hypertension) 09/16/2013  . Anxiety 09/16/2013  . Back pain 09/16/2013    Past Surgical History:  Procedure Laterality Date  . ANKLE SURGERY      left  . APPENDECTOMY    . BACK SURGERY    . COLONOSCOPY    . KNEE SURGERY     left       Home Medications    Prior to Admission medications   Medication Sig Start Date End Date Taking? Authorizing Provider  aspirin EC 81 MG tablet Take 81 mg by mouth daily.   Yes Historical Provider, MD  benazepril (LOTENSIN) 40 MG tablet Take 40 mg by mouth daily.   Yes Historical Provider, MD  cyclobenzaprine (FLEXERIL) 10 MG tablet Take 1 tablet (10 mg total) by mouth 3 (three) times daily as needed for muscle spasms. 09/19/13  Yes Adeline Saralyn Pilar, MD  diazepam (VALIUM) 10 MG tablet Take 0.5 tablets (5 mg total) by mouth every 6 (six) hours as needed for anxiety (back spasms). Patient taking differently: Take 10 mg by mouth 4 (four) times daily as needed for anxiety (back spasms).  09/19/13  Yes Adeline Saralyn Pilar, MD  doxycycline (VIBRA-TABS) 100 MG tablet Take 1 tablet (100 mg total) by mouth 2 (two) times daily. 11/27/15  Yes Truman Hayward, MD  fluticasone [facility]) 50 MCG/ACT nasal spray Place 1 spray into the nose daily as needed for allergies.  07/04/14  Yes Historical Provider, MD  NEOMYCIN-POLYMYXIN-HYDROCORTISONE (CORTISPORIN) 1 % SOLN otic solution Place 2 drops in ear(s) daily. Reported on 11/12/2015 07/04/14  Yes Historical Provider, MD  pregabalin (LYRICA) 75 MG capsule Take 75 mg by mouth 2 (two) times daily.  05/07/13  Yes Historical Provider, MD  tamsulosin (FLOMAX) 0.4 MG CAPS capsule Take 0.4 mg by mouth.   Yes Historical Provider, MD    Family History Family History  Problem Relation Age of Onset  . COPD Mother   . Hyperthyroidism Mother   . Hyperthyroidism Sister     Social History Social History  Substance Use Topics  . Smoking status: Current Every Day Smoker    Packs/day: 0.50    Years: 39.00    Types: Cigarettes  . Smokeless tobacco: Never Used  . Alcohol use No     Comment: occasionally     Allergies   Codeine; Penicillins; Latex; and Strawberry  extract   Review of Systems Review of Systems  Constitutional: Positive for appetite change.  HENT: Negative for sneezing.   Respiratory: Negative for cough.   Neurological: Positive for weakness.  All other systems reviewed and are negative.    Physical Exam Updated Vital Signs BP 125/93   Pulse 74   Temp 97.7 F (36.5 C) (Oral)   Resp (!) 31   Ht 6' 2.5" (1.892 m)   Wt 165 lb (74.8 kg)   SpO2 95%   BMI 20.90 kg/m   Physical Exam  Constitutional: He is oriented to person, place, and time. He appears well-developed. He has a sickly appearance.  Appears older than stated age. He is disheveled  HENT:  Head: Normocephalic and atraumatic.  Right Ear: External ear normal.  Left Ear: External ear normal.  Eyes: Conjunctivae and EOM are normal. Pupils are equal,  round, and reactive to light.  Neck: Normal range of motion and phonation normal. Neck supple.  Cardiovascular: Normal rate, regular rhythm and normal heart sounds.   Pulmonary/Chest: Effort normal and breath sounds normal. No respiratory distress. He exhibits no bony tenderness.  Abdominal: Soft. He exhibits no distension and no mass. There is no tenderness. There is no guarding.  Musculoskeletal: Normal range of motion. He exhibits no deformity.  Contusion of his knees bilaterally and his left forefoot.  Neurological: He is alert and oriented to person, place, and time. No cranial nerve deficit or sensory deficit. He exhibits normal muscle tone. Coordination normal.  Skin: Skin is warm and dry. Abrasion noted.  Scattered abrasions on his lower back, anterior knees, and dorsal feet.  Psychiatric: He has a normal mood and affect. His behavior is normal.  Nursing note and vitals reviewed.    ED Treatments / Results  DIAGNOSTIC STUDIES: Oxygen Saturation is 98% on RA, normal by my interpretation.    COORDINATION OF CARE: 10:02 AM Discussed treatment plan with pt at bedside, which includes CXR, foot Xray, and EKG,  and pt agreed to plan.  Labs (all labs ordered are listed, but only abnormal results are displayed) Labs Reviewed  CBC WITH DIFFERENTIAL/PLATELET - Abnormal; Notable for the following:       Result Value   WBC 19.4 (*)    RBC 4.10 (*)    HCT 37.2 (*)    Neutro Abs 17.2 (*)    Monocytes Absolute 1.5 (*)    All other components within normal limits  BASIC METABOLIC PANEL - Abnormal; Notable for the following:    Sodium 130 (*)    Chloride 95 (*)    Glucose, Bld 115 (*)    BUN 41 (*)    Creatinine, Ser 1.72 (*)    GFR calc non Af Amer 40 (*)    GFR calc Af Amer 46 (*)    All other components within normal limits  I-STAT CG4 LACTIC ACID, ED - Abnormal; Notable for the following:    Lactic Acid, Venous 2.28 (*)    All other components within normal limits  URINE CULTURE  URINALYSIS, ROUTINE W REFLEX MICROSCOPIC    EKG  EKG Interpretation  Date/Time:  Thursday June 03 2016 09:51:37 EST Ventricular Rate:  104 PR Interval:    QRS Duration: 90 QT Interval:  327 QTC Calculation: 431 R Axis:   62 Text Interpretation:  Sinus tachycardia Low voltage, extremity leads since last tracing no significant change Confirmed by Eulis Foster  MD, Ferman Basilio [phone]) on 06/03/2016 10:21:49 AM       Radiology Dg Chest 1 View  Result Date: 06/03/2016 CLINICAL DATA:  Increased weakness x 2 weeks. Multiple falls. Hx of smoking and htn. EXAM: CHEST 1 VIEW COMPARISON:  09/16/2013 FINDINGS: There is a well demarcated opacity projecting in the left mid to lower lung, new since the prior exam. There are lung markings at extending peripheral to this. This does not appear to be a pneumothorax although potentially could reflect partial collapse of the lingular segment of the left upper lobe. This could reflect a pleural based opacity. Remainder of the lungs is clear.  Lungs are mildly hyperexpanded. No pleural effusion.  No pneumothorax. Cardiac silhouette is normal in size. No mediastinal or hilar masses or  convincing adenopathy. Posterior fusion from the lower thoracic to the lumbar spine, incompletely imaged, is new since the prior study. IMPRESSION: 1. Opacity projecting in the left mid to lower lung, new  since the prior exam. Although new since the prior study, this still may reflect a chronic finding. This is unlikely to reflect pneumonia. This could be further assessed with either chest CT or PA and lateral views of the chest. 2. No other lung opacities. Electronically Signed   By: Lajean Manes M.D.   On: 06/03/2016 11:22   Dg Foot Complete Left  Result Date: 06/03/2016 CLINICAL DATA:  Increased weakness x 2 weeks. Has hard time walking, multiple falls. Pt says that he has been crawling around the house the last few days due to inability to walk. Pain and bruising lt foot. Pt held for imaging. Hx of surgery on the left knee and ankle. He has had problems since the surgery. EXAM: LEFT FOOT - COMPLETE 3+ VIEW COMPARISON:  None. FINDINGS: No fracture.  No bone lesion. The joints are normally aligned. Small amount of calcification at the base of the plantar fascia adjacent to the calcaneal tuberosity. Soft tissues are otherwise unremarkable. IMPRESSION: No fracture, bone lesion or significant joint abnormality. No acute finding. Electronically Signed   By: Lajean Manes M.D.   On: 06/03/2016 11:24    Procedures Procedures (including critical care time)  Medications Ordered in ED Medications  cefTRIAXone (ROCEPHIN) 2 g in dextrose 5 % 50 mL IVPB (not administered)  sodium chloride 0.9 % bolus 1,700 mL (not administered)  sodium chloride 0.9 % bolus 500 mL (0 mLs Intravenous Stopped 06/03/16 1227)     Initial Impression / Assessment and Plan / ED Course  I have reviewed the triage vital signs and the nursing notes.  Pertinent labs & imaging results that were available during my care of the patient were reviewed by me and considered in my medical decision making (see chart for details).  Clinical  Course as of Jun 03 1320  Thu Jun 03, 2016  1228 Case discussed with on-call urology regarding his recent evaluation by them in the office. Patient does chronic self-catheterization, when he feels the urge to void. On the recent visit, the patient was not symptomatic for urinary tract infection. A urine culture was sent at that time, which returned showing enterococcus. Today the patient is symptomatic with suspected infection. The most likely source of infection at this time appears to be urinary tract. Therefore, the patient will need aggressive treatment. He will also require Foley catheterization, a cousin of his history of urinary retention, self-catheterization, and the need for high volume fluid resuscitation. Bladder scan shows urinary retention at this time, greater than 250 cc.  [EW]  1320 High Lactic Acid, Venous: (!!) 2.28 [EW]  1320 Low Sodium: (!) 130 [EW]  1321 High Creatinine: (!) 1.72 [EW]  1321 High WBC: (!) 19.4 [EW]    Clinical Course User Index [EW] Daleen Bo, MD    Medications  cefTRIAXone (ROCEPHIN) 2 g in dextrose 5 % 50 mL IVPB (not administered)  sodium chloride 0.9 % bolus 1,700 mL (not administered)  sodium chloride 0.9 % bolus 500 mL (0 mLs Intravenous Stopped 06/03/16 1227)    Patient Vitals for the past 24 hrs:  BP Temp Temp src Pulse Resp SpO2 Height Weight  06/03/16 1200 125/93 - - - (!) 31 - - -  06/03/16 1130 169/77 - - - 23 - - -  06/03/16 0948 130/83 97.7 F (36.5 C) Oral 74 18 95 % - -  06/03/16 0946 - - - - - - 6' 2.5" (1.892 m) 165 lb (74.8 kg)    12:30 PM Reevaluation  with update and discussion. After initial assessment and treatment, an updated evaluation reveals he remains comfortable, cooperative. Findings discussed with the patient and all questions were answered. Sherrilynn Gudgel L   Sepsis - Repeat Assessment  Performed at:    12:30  Vitals     Blood pressure 125/93, pulse 74, temperature 97.7 F (36.5 C), temperature source Oral, resp.  rate (!) 31, height 6' 2.5" (1.892 m), weight 165 lb (74.8 kg), SpO2 95 %.  Heart:     Regular rate and rhythm  Lungs:    CTA  Capillary Refill:   <2 sec  Peripheral Pulse:   Radial pulse palpable  Skin:     Normal Color     12:35 PM-Consult complete with hospitalist. Patient case explained and discussed. He agrees to admit patient for further evaluation and treatment. Call ended at 12:45  CRITICAL CARE Performed by: Richarda Blade Total critical care time: 45 minutes Critical care time was exclusive of separately billable procedures and treating other patients. Critical care was necessary to treat or prevent imminent or life-threatening deterioration. Critical care was time spent personally by me on the following activities: development of treatment plan with patient and/or surrogate as well as nursing, discussions with consultants, evaluation of patient's response to treatment, examination of patient, obtaining history from patient or surrogate, ordering and performing treatments and interventions, ordering and review of laboratory studies, ordering and review of radiographic studies, pulse oximetry and re-evaluation of patient's condition.  Final Clinical Impressions(s) / ED Diagnoses   Final diagnoses:  Urinary tract infection without hematuria, site unspecified  Weakness  Abrasion, multiple sites  Left foot pain  Urinary retention    Nursing Notes Reviewed/ Care Coordinated, and agree without changes. Applicable Imaging Reviewed.  Interpretation of Laboratory Data incorporated into ED treatment  Plan: Admit  New Prescriptions New Prescriptions   No medications on file   I personally performed the services described in this documentation, which was scribed in my presence. The recorded information has been reviewed and is accurate.    Daleen Bo, MD 06/03/16 1323

## 2016-06-03 NOTE — ED Triage Notes (Signed)
Pt c/o generalized weakness x 2 weeks, fatigue, poor appetite. Pt states has bruising and skin tears to bilateral knees and feet from sliding around house because he is too weak to walk. CBG-84 per EMS.

## 2016-06-03 NOTE — ED Notes (Signed)
Bladder scan done.  240ml present.  Pt has hx of urinary retention.

## 2016-06-04 ENCOUNTER — Inpatient Hospital Stay (HOSPITAL_COMMUNITY): Payer: PPO

## 2016-06-04 DIAGNOSIS — N179 Acute kidney failure, unspecified: Secondary | ICD-10-CM | POA: Diagnosis present

## 2016-06-04 DIAGNOSIS — N319 Neuromuscular dysfunction of bladder, unspecified: Secondary | ICD-10-CM | POA: Diagnosis present

## 2016-06-04 DIAGNOSIS — R1084 Generalized abdominal pain: Secondary | ICD-10-CM

## 2016-06-04 DIAGNOSIS — G64 Other disorders of peripheral nervous system: Secondary | ICD-10-CM

## 2016-06-04 LAB — COMPREHENSIVE METABOLIC PANEL
ALT: 50 U/L (ref 17–63)
ANION GAP: 7 (ref 5–15)
AST: 82 U/L — ABNORMAL HIGH (ref 15–41)
Albumin: 3 g/dL — ABNORMAL LOW (ref 3.5–5.0)
Alkaline Phosphatase: 54 U/L (ref 38–126)
BILIRUBIN TOTAL: 0.9 mg/dL (ref 0.3–1.2)
BUN: 27 mg/dL — ABNORMAL HIGH (ref 6–20)
CO2: 23 mmol/L (ref 22–32)
Calcium: 8 mg/dL — ABNORMAL LOW (ref 8.9–10.3)
Chloride: 103 mmol/L (ref 101–111)
Creatinine, Ser: 0.89 mg/dL (ref 0.61–1.24)
GFR calc Af Amer: >60 mL/min (ref >60)
Glucose, Bld: 109 mg/dL — ABNORMAL HIGH (ref 65–99)
POTASSIUM: 3.7 mmol/L (ref 3.5–5.1)
Sodium: 133 mmol/L — ABNORMAL LOW (ref 135–145)
TOTAL PROTEIN: 5.5 g/dL — AB (ref 6.5–8.1)

## 2016-06-04 LAB — CBC
HEMATOCRIT: 32.9 % — AB (ref 39.0–52.0)
Hemoglobin: 11.7 g/dL — ABNORMAL LOW (ref 13.0–17.0)
MCH: 32.2 pg (ref 26.0–34.0)
MCHC: 35.6 g/dL (ref 30.0–36.0)
MCV: 90.6 fL (ref 78.0–100.0)
Platelets: 335 10*3/uL (ref 150–400)
RBC: 3.63 MIL/uL — ABNORMAL LOW (ref 4.22–5.81)
RDW: 13.6 % (ref 11.5–15.5)
WBC: 12.5 10*3/uL — ABNORMAL HIGH (ref 4.0–10.5)

## 2016-06-04 MED ORDER — POTASSIUM CHLORIDE CRYS ER 20 MEQ PO TBCR
20.0000 meq | EXTENDED_RELEASE_TABLET | Freq: Two times a day (BID) | ORAL | Status: AC
  Administered 2016-06-04 (×2): 20 meq via ORAL
  Filled 2016-06-04 (×2): qty 1

## 2016-06-04 MED ORDER — BENAZEPRIL HCL 10 MG PO TABS
20.0000 mg | ORAL_TABLET | Freq: Every day | ORAL | Status: DC
  Administered 2016-06-04 – 2016-06-07 (×4): 20 mg via ORAL
  Filled 2016-06-04 (×6): qty 2

## 2016-06-04 NOTE — Evaluation (Signed)
Physical Therapy Evaluation Patient Details Name: Marc Rose MRN: [id number] DOB: [birth year] Today's Date: 06/04/2016   History of Present Illness  66 year old man with multiple lumbar/spinal surgeries, history of lumbar discitis and osteomyelitis-on chronic oral doxycycline, urinary retention requiring self bladder catheterizations since 2014, chronic lower abdominal pain, and HTN, who presented to the ED on 06/03/16 for lower abdominal pain and generalized weakness. He has had multiple surgeries on his left lower extremity. Patient has been on chronic doxycycline for chronic discitis and osteomyelitis since 2015. Patient has a urine analysis positive for infection from January 26 with culture positive for Enterococcus faecalis.   Clinical Impression  Pt received sitting up in the chair, with friend present - who leaves during evaluation.  Pt states that he normally ambulates with a cane or a piece of PVC pipe, and he is normally independent with dressing and bathing.  He reports having "over 100" falls in the past 6 months with noted lesions all over his body, especially on his knees.  Pt states that is why he falls - his L knee gives out.  Pt demonstrates ataxia in all 4 extremities with R side seemingly worse than the left  He also demonstrates truncal ataxia while sitting on the EOB, and therefore requires assistance to maintain static sitting balance.  Pt required Max A for sit<>stand, and Max A +2 for stand pivot from the chair<>bed due to inability to take steps and poor motor planning.  This patient is not safe to return home at this time due to extensive falls, and high risk for recurrent falls.  He is also requiring significantly more assistance for all functional mobility tasks, but he will not have this assistance at home - he lives alone.  He truly should be mobilizing in a w/c at this point to reduce his risk for additional falls and is recommended to have a full w/c evaluation completed at  d/c.  He is recommended for SNF at this point due to the aforementioned issues.      Follow Up Recommendations SNF;Supervision/Assistance - 24 hour (w/c evaluation)    Equipment Recommendations  Other (comment)    Recommendations for Other Services OT consult     Precautions / Restrictions Precautions Precautions: Fall Precaution Comments: 100 in the past 6 months.  L knee is popping in and out.  Pt is very ataxic  Restrictions Weight Bearing Restrictions: No      Mobility  Bed Mobility Overal bed mobility: Needs Assistance Bed Mobility: Sit to Supine;Rolling Rolling: Min assist     Sit to supine: Min assist;+2 for physical assistance;+2 for safety/equipment   General bed mobility comments: Pt was incontinent of stool during transfer.  Pt requires assistance for hygiene afterwards.   Transfers Overall transfer level: Needs assistance Equipment used: Rolling walker (2 wheeled) Transfers: Sit to/from [facility] Sit to Stand: Max assist (Strong posterior lean) Stand pivot transfers: Max assist;+2 physical assistance;+2 safety/equipment (Pt was not able to pick feet up to actually take steps to transfer chair to bed.  )          Ambulation/Gait Ambulation/Gait assistance:  (Not appropriate at this time due to high fall risk with ataxia. )              Stairs            Wheelchair Mobility    Modified Rankin (Stroke Patients Only)       Balance Overall balance assessment: History of Falls;Needs assistance Sitting-balance  support: Bilateral upper extremity supported;Feet supported Sitting balance-Leahy Scale: Poor Sitting balance - Comments: Pt demonstrates truncal ataxia when seated on the EOB and requires constant assistance to maintain static seated balance.   Postural control: Posterior lean Standing balance support: Bilateral upper extremity supported Standing balance-Leahy Scale: Zero Standing balance comment: continued  posterior lean                             Pertinent Vitals/Pain Pain Assessment: 0-10 Pain Score: 7  Pain Location: mid back.  Pain Descriptors / Indicators: Sharp Pain Intervention(s): Limited activity within patient's tolerance;Monitored during session;Repositioned    Home Living Family/patient expects to be discharged to:: Private residence Living Arrangements: Alone Available Help at Discharge: Neighbor (Pt states that their is a Engineer, structural neighbor that calls him every day, and his other neighbor Vaughan Basta also checks on him. ) Type of Home: House Home Access: Stairs to enter   [employer] of Steps: 2 steps, 7 steps at the back Home Layout: One level Home Equipment: Cane - single point;Bedside commode;Shower seat;Wheelchair - [employer] - 2 wheels      Prior Function Level of Independence: Independent with assistive device(s)   Gait / Transfers Assistance Needed: pt states he normally uses his cane or a piece of PVC pipe for ambulation.   ADL's / Homemaking Assistance Needed: independent with dressing and bathing.  Driving and is able to go run errands.          Hand Dominance   Dominant Hand: Right    Extremity/Trunk Assessment   Upper Extremity Assessment Upper Extremity Assessment: Generalized weakness (Ataxia noted in all extremities, but worse in the R UE.)    Lower Extremity Assessment Lower Extremity Assessment: Generalized weakness (Pt is noted to be ataxic in all extremitiesss)       Communication   Communication: No difficulties  Cognition Arousal/Alertness: Awake/alert Behavior During Therapy: Impulsive Overall Cognitive Status: Impaired/Different from baseline Area of Impairment: Orientation Orientation Level: Situation;Disoriented to             General Comments: Pt is very tangental with conversation    General Comments General comments (skin integrity, edema, etc.): multiple skin lesions, evidence of multiple  falls     Exercises     Assessment/Plan    PT Assessment Patient needs continued PT services  PT Problem List Decreased strength;Decreased activity tolerance;Decreased balance;Decreased mobility;Decreased coordination;Decreased cognition;Decreased knowledge of use of DME;Decreased safety awareness;Decreased knowledge of precautions;Decreased skin integrity          PT Treatment Interventions DME instruction;Gait training;Functional mobility training;Neuromuscular re-education;Balance training;Wheelchair mobility training;Therapeutic exercise;Therapeutic activities;Cognitive remediation;Patient/family education    PT Goals (Current goals can be found in the Care Plan section)  Acute Rehab PT Goals Patient Stated Goal: None stated Time For Goal Achievement: 06/18/16 Potential to Achieve Goals: Fair    Frequency Min 3X/week   Barriers to discharge        Co-evaluation               End of Session Equipment Utilized During Treatment: Gait belt Activity Tolerance: Patient tolerated treatment well Patient left: in bed;with call bell/phone within reach;with bed alarm set Nurse Communication: Mobility status Jenny Reichmann, RN assisted with tx.  Mobility sheet left hanging in the room. )    Functional Assessment Tool Used: [employer]	[place] AM-PAC "6-clicks" Functional Limitation: Mobility: Walking and moving around Mobility: Walking and Moving Around Current Status [phone]): At least 60 percent but  less than 80 percent impaired, limited or restricted Mobility: Walking and Moving Around Goal Status [phone]): At least 40 percent but less than 60 percent impaired, limited or restricted    Time: [id number] PT Time Calculation (min) (ACUTE ONLY): 36 min   Charges:   PT Evaluation $PT Eval Moderate Complexity: 1 Procedure PT Treatments $Therapeutic Activity: 8-22 mins   PT G Codes:   PT G-Codes **NOT FOR INPATIENT CLASS** Functional Assessment Tool Used: [employer]  "6-clicks" Functional Limitation: Mobility: Walking and moving around Mobility: Walking and Moving Around Current Status [phone]): At least 60 percent but less than 80 percent impaired, limited or restricted Mobility: Walking and Moving Around Goal Status [phone]): At least 40 percent but less than 60 percent impaired, limited or restricted   Beth Zachory Mangual, PT, DPT X: [phone]

## 2016-06-04 NOTE — Progress Notes (Addendum)
PROGRESS NOTE    Marc Rose  [id number] DOB: 12-02-1950 DOA: 06/03/2016 PCP: [name] Patient   ID-Dr. Tommy Medal   Brief Narrative:  Patient is a 66 year old man with multiple lumbar/spinal surgeries, history of lumbar discitis and osteomyelitis-on chronic oral doxycycline, urinary retention requiring self bladder catheterizations since 2014, chronic lower abdominal pain, and HTN, who presented to the ED on 06/03/16 for lower abdominal pain and generalized weakness. Apparently, urinalysis was ordered on 05/28/16 and became positive for Enterococcus faecalis. In the ED, he was afebrile and hemodynamically stable. His UA in the ED was cloudy, nitrite negative, too numerous to count WBCs, and rare bacteria. His sodium was 130, creatinine 1.72, and lactic acid 2.28. His WBC was 19.4. He was admitted for further evaluation and management.  Assessment & Plan:   Principal Problem:   UTI (urinary tract infection) due to Enterococcus Active Problems:   Abdominal pain, generalized   Neurogenic urinary bladder disorder   AKI (acute kidney injury) ([place])   HTN (hypertension)   Long term current use of antibiotics   Back pain, chronic   Discitis of lumbar region   Nerve disease, peripheral ([place])    1. Urinary tract infection secondary to enterococcus, associated with in and out self bladder catheterizations. -Apparently an outpatient UA on 05/28/16 grew out Enterococcus faecalis. -Another UA on admission was ordered and the culture is pending. -Enterococcus was noted to be sensitive to Levaquin and the patient is penicillin allergic, so IV Levaquin was started. -Indwelling Foley catheter was inserted. Flomax was re-ordered. [name] continue IV fluids and supportive treatment.  Mild lactic acidosis without sepsis. Patient's lactic acid was 2.28 on admission. It has normalized to 1.18 following IV fluids and start of antibiotics.  Chronic abdominal pain. Patient apparently has a history of  chronic abdominal pain. CT scan of his abdomen and pelvis revealed no gross acute finding, possible constipation, and distal wall thickening suggesting esophagitis. (The study was moderately degraded). -We will add Protonix.  Acute kidney injury, secondary to prerenal azotemia. Patient has no history of chronic kidney disease. His creatinine was 1.721 admission. -IV fluids were started. His creatinine has normalized.  Hyponatremia. Patient is serum sodium was 130 on admission, likely from hypovolemia hyponatremia. He was started on normal saline infusion. His sodium has improved 133. -We'll continued normal saline IV fluids for another 24 hours or until sodium approaches normal range.  Hypertension. Patient is treated chronically with Lotensin. It was held on admission due to AKI and low-normal blood pressures. -His blood pressure is trending up, so we'll restart Lotensin at half the dose and continue to monitor his renal function and blood pressure.  History of chronic discitis, vertebral osteomyelitis. -Patient has a history of hardware associated lumbar discitis, osteomyelitis, and less so as muscle abscess in 2014-2015. Apparently the hardware was removed in 2015. He was treated with IV antibiotics and then subsequently started on oral treatment with doxycycline ? indefinitely. It was not restarted on admission due to the start of IV Levaquin for treatment of UTI. -He is followed by ID, Dr. Tommy Medal.  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 -Patient reported progressive weakness all over, not excluding his legs. He reported crawling around at home because he was too weak to walk. He attributes this to his "back problems". -Patient is treated chronically with Lyrica, Flexeril as needed and diazepam as needed. All were continued. -On exam, he is able to lift  both legs against gravity without difficulty and  without pain. -PT evaluation has been ordered; evaluation pending. The patient may need short-term SNF placement. -We'll order B12 and TSH for further evaluation.   DVT prophylaxis: Subcutaneous heparin Code Status: Full code Family Communication: Family not available. Disposition Plan: Discharge when clinically appropriate; patient may need short-term SNF.   Consultants:   None  Procedures:   None  Antimicrobials:   Levaquin 06/03/16>>    Subjective: Patient says that he feels better. He has no active back pain or subjective fever or chills.  Objective: Vitals:   06/03/16 1430 06/03/16 1500 06/03/16 2303 06/04/16 0630  BP: 111/92 130/72 (!) 147/84 128/74  Pulse:   84 82  Resp: 18 18 18 16   Temp:  97.4 F (36.3 C) 97.9 F (36.6 C) 97.6 F (36.4 C)  TempSrc:  Oral Oral Oral  SpO2:  91% 100% 98%  Weight:  78 kg (172 lb)    Height:  6\' 2"  (1.88 m)      Intake/Output Summary (Last 24 hours) at 06/04/16 1402 Last data filed at 06/04/16 0559  Gross per 24 hour  Intake          5428.33 ml  Output             2250 ml  Net          3178.33 ml   Filed Weights   06/03/16 0946 06/03/16 1500  Weight: 74.8 kg (165 lb) 78 kg (172 lb)    Examination:  General exam: Appears calm and comfortable  Respiratory system: Clear to auscultation. Respiratory effort normal. Cardiovascular system: S1 & S2 heard, RRR. No JVD, murmurs, rubs, gallops or clicks. No pedal edema. Gastrointestinal system: Abdomen is nondistended, soft and nontender. No organomegaly or masses felt. Normal bowel sounds heard. Central nervous system: Patient is initially confused, but became alert and oriented 3. He appears to have some spastic movements of his upper extremities as he reaches for a cup. He is able to raise each leg against gravity approximately 40. Cranial nerves II through XII appear grossly intact. Extremities: Symmetric 5 x 5 power. Skin: No rashes, lesions or ulcers Psychiatry:  Judgement and insight appear normal after initial confusion. Mood & affect appropriate.     Data Reviewed: I have personally reviewed following labs and imaging studies  CBC:  Recent Labs Lab 06/03/16 1040 06/04/16 0531  WBC 19.4* 12.5*  NEUTROABS 17.2*  --   HGB 13.3 11.7*  HCT 37.2* 32.9*  MCV 90.7 90.6  PLT 339 [id number]   Basic Metabolic Panel:  Recent Labs Lab 06/03/16 1040 06/04/16 0531  NA 130* 133*  K 3.8 3.7  CL 95* 103  CO2 25 23  GLUCOSE 115* 109*  BUN 41* 27*  CREATININE 1.72* 0.89  CALCIUM 9.0 8.0*   GFR: Estimated Creatinine Clearance: 91.3 mL/min (by C-G formula based on SCr of 0.89 mg/dL). Liver Function Tests:  Recent Labs Lab 06/04/16 0531  AST 82*  ALT 50  ALKPHOS 54  BILITOT 0.9  PROT 5.5*  ALBUMIN 3.0*   No results for input(s): LIPASE, AMYLASE in the last 168 hours. No results for input(s): AMMONIA in the last 168 hours. Coagulation Profile: No results for input(s): INR, PROTIME in the last 168 hours. Cardiac Enzymes: No results for input(s): CKTOTAL, CKMB, CKMBINDEX, TROPONINI in the last 168 hours. BNP (last 3 results) No results for input(s): PROBNP in the last 8760 hours. HbA1C: No results for input(s): HGBA1C in the last  72 hours. CBG: No results for input(s): GLUCAP in the last 168 hours. Lipid Profile: No results for input(s): CHOL, HDL, LDLCALC, TRIG, CHOLHDL, LDLDIRECT in the last 72 hours. Thyroid Function Tests: No results for input(s): TSH, T4TOTAL, FREET4, T3FREE, THYROIDAB in the last 72 hours. Anemia Panel: No results for input(s): VITAMINB12, FOLATE, FERRITIN, TIBC, IRON, RETICCTPCT in the last 72 hours. Sepsis Labs:  Recent Labs Lab 06/03/16 1136 06/03/16 1444  LATICACIDVEN 2.28* 1.18    Recent Results (from the past 240 hour(s))  Culture, Urine     Status: Abnormal   Collection Time: 05/28/16  2:41 PM  Result Value Ref Range Status   Specimen Description URINE, RANDOM  Final   Special Requests NONE   Final   Culture 80,000 COLONIES/mL ENTEROCOCCUS FAECALIS (A)  Final   Report Status 05/31/2016 FINAL  Final   Organism ID, Bacteria ENTEROCOCCUS FAECALIS (A)  Final      Susceptibility   Enterococcus faecalis - MIC*    AMPICILLIN <=2 SENSITIVE Sensitive     LEVOFLOXACIN 1 SENSITIVE Sensitive     NITROFURANTOIN <=16 SENSITIVE Sensitive     VANCOMYCIN 1 SENSITIVE Sensitive     * 80,000 COLONIES/mL ENTEROCOCCUS FAECALIS         Radiology Studies: Ct Abdomen Pelvis Wo Contrast  Result Date: 06/03/2016 CLINICAL DATA:  Urinary retention with self bladder catheterization to since 2014. Lower extremity weakness. Multiple falls. Lower abdominal pain starting last night. EXAM: CT ABDOMEN AND PELVIS WITHOUT CONTRAST TECHNIQUE: Multidetector CT imaging of the abdomen and pelvis was performed following the standard protocol without IV contrast. COMPARISON:  08/24/2013 FINDINGS: Lower chest: Left base atelectasis. Normal heart size. Persistent distal esophageal wall thickening. Normal heart size without pericardial or pleural effusion. Hepatobiliary: Multifactorial degradation, including lack of IV contrast extensive beam hardening artifact from lumbar spine fixation. Mild hepatic steatosis. Grossly normal gallbladder, without biliary duct dilatation. Pancreas: Normal, without mass or ductal dilatation. Spleen: Normal in size, without focal abnormality. Adrenals/Urinary Tract: Grossly normal adrenal glands. No renal calculi or hydronephrosis. The urinary bladder is positioned eccentric left, primarily decompressed around a Foley catheter. No pericystic fluid identified. Stomach/Bowel: Normal remainder of the stomach. Colonic stool burden suggests constipation. The cecum is positioned within the central pelvis. Appendix not visualized. Normal small bowel caliber. Vascular/Lymphatic: Aortic and branch vessel atherosclerosis. Right greater than left common iliac artery dilatation is again identified. Example at  2.1 cm. No gross abdominal adenopathy. No pelvic sidewall adenopathy. Reproductive: Mild prostatomegaly. Other: No significant free fluid. Musculoskeletal: Osteopenia. Lumbosacral spine fixation from T10 through L4. IMPRESSION: 1. Moderate multifactorial degradation, as detailed above. 2. No gross acute finding in the abdomen or pelvis. 3.  Possible constipation. 4. Common iliac artery enlargement, worse on the right, similar. 5. Persistent distal esophageal wall thickening, suggesting esophagitis. Electronically Signed   By: Abigail Miyamoto M.D.   On: 06/03/2016 17:41   Dg Chest 1 View  Result Date: 06/03/2016 CLINICAL DATA:  Increased weakness x 2 weeks. Multiple falls. Hx of smoking and htn. EXAM: CHEST 1 VIEW COMPARISON:  09/16/2013 FINDINGS: There is a well demarcated opacity projecting in the left mid to lower lung, new since the prior exam. There are lung markings at extending peripheral to this. This does not appear to be a pneumothorax although potentially could reflect partial collapse of the lingular segment of the left upper lobe. This could reflect a pleural based opacity. Remainder of the lungs is clear.  Lungs are mildly hyperexpanded. No pleural effusion.  No pneumothorax. Cardiac silhouette is normal in size. No mediastinal or hilar masses or convincing adenopathy. Posterior fusion from the lower thoracic to the lumbar spine, incompletely imaged, is new since the prior study. IMPRESSION: 1. Opacity projecting in the left mid to lower lung, new since the prior exam. Although new since the prior study, this still may reflect a chronic finding. This is unlikely to reflect pneumonia. This could be further assessed with either chest CT or PA and lateral views of the chest. 2. No other lung opacities. Electronically Signed   By: Lajean Manes M.D.   On: 06/03/2016 11:22   Dg Chest 2 View  Result Date: 06/04/2016 CLINICAL DATA:  Abnormal chest x-ray. EXAM: CHEST  2 VIEW COMPARISON:  Radiograph of  June 03, 2016. FINDINGS: The heart size and mediastinal contours are within normal limits. Both lungs are clear. No pneumothorax or pleural effusion is noted. Probable minimally displaced manubrial fracture is noted on lateral projection. IMPRESSION: No active cardiopulmonary disease. Probable minimally displaced manubrial fracture is noted on lateral projection. Clinical correlation is recommended. Electronically Signed   By: Marijo Conception, M.D.   On: 06/04/2016 09:24   Dg Foot Complete Left  Result Date: 06/03/2016 CLINICAL DATA:  Increased weakness x 2 weeks. Has hard time walking, multiple falls. Pt says that he has been crawling around the house the last few days due to inability to walk. Pain and bruising lt foot. Pt held for imaging. Hx of surgery on the left knee and ankle. He has had problems since the surgery. EXAM: LEFT FOOT - COMPLETE 3+ VIEW COMPARISON:  None. FINDINGS: No fracture.  No bone lesion. The joints are normally aligned. Small amount of calcification at the base of the plantar fascia adjacent to the calcaneal tuberosity. Soft tissues are otherwise unremarkable. IMPRESSION: No fracture, bone lesion or significant joint abnormality. No acute finding. Electronically Signed   By: Lajean Manes M.D.   On: 06/03/2016 11:24        Scheduled Meds: . aspirin EC  81 mg Oral Daily  . heparin  5,000 Units Subcutaneous Q8H  . levofloxacin (LEVAQUIN) IV  500 mg Intravenous Q24H  . pregabalin  75 mg Oral BID  . tamsulosin  0.4 mg Oral QPC supper   Continuous Infusions: . sodium chloride 100 mL/hr at 06/04/16 0717     LOS: 1 day    Time spent: 25 minutes    Rexene Alberts, MD [facility] Pager [phone]  If 7PM-7AM, please contact night-coverage [email or web address] Password [email or web address] 06/04/2016, 2:02 PM

## 2016-06-05 ENCOUNTER — Encounter (HOSPITAL_COMMUNITY): Payer: Self-pay | Admitting: Internal Medicine

## 2016-06-05 DIAGNOSIS — Z792 Long term (current) use of antibiotics: Secondary | ICD-10-CM

## 2016-06-05 DIAGNOSIS — R270 Ataxia, unspecified: Secondary | ICD-10-CM

## 2016-06-05 HISTORY — DX: Ataxia, unspecified: R27.0

## 2016-06-05 LAB — CBC
HEMATOCRIT: 33.1 % — AB (ref 39.0–52.0)
HEMOGLOBIN: 11.8 g/dL — AB (ref 13.0–17.0)
MCH: 32.9 pg (ref 26.0–34.0)
MCHC: 35.6 g/dL (ref 30.0–36.0)
MCV: 92.2 fL (ref 78.0–100.0)
Platelets: 332 10*3/uL (ref 150–400)
RBC: 3.59 MIL/uL — ABNORMAL LOW (ref 4.22–5.81)
RDW: 14 % (ref 11.5–15.5)
WBC: 8.2 10*3/uL (ref 4.0–10.5)

## 2016-06-05 LAB — BASIC METABOLIC PANEL
ANION GAP: 6 (ref 5–15)
BUN: 15 mg/dL (ref 6–20)
CALCIUM: 8.3 mg/dL — AB (ref 8.9–10.3)
CO2: 27 mmol/L (ref 22–32)
Chloride: 101 mmol/L (ref 101–111)
Creatinine, Ser: 0.73 mg/dL (ref 0.61–1.24)
Glucose, Bld: 118 mg/dL — ABNORMAL HIGH (ref 65–99)
POTASSIUM: 3.9 mmol/L (ref 3.5–5.1)
SODIUM: 134 mmol/L — AB (ref 135–145)

## 2016-06-05 LAB — TSH: TSH: 2.985 u[IU]/mL (ref 0.350–4.500)

## 2016-06-05 LAB — VITAMIN B12: Vitamin B-12: 684 pg/mL (ref 180–914)

## 2016-06-05 MED ORDER — SENNOSIDES-DOCUSATE SODIUM 8.6-50 MG PO TABS
1.0000 | ORAL_TABLET | Freq: Every day | ORAL | Status: DC
  Administered 2016-06-05 – 2016-06-24 (×12): 1 via ORAL
  Filled 2016-06-05 (×13): qty 1

## 2016-06-05 MED ORDER — MAGNESIUM HYDROXIDE 400 MG/5ML PO SUSP
5.0000 mL | Freq: Every day | ORAL | Status: DC | PRN
  Administered 2016-06-13: 5 mL via ORAL
  Filled 2016-06-05: qty 30

## 2016-06-05 MED ORDER — CYCLOBENZAPRINE HCL 10 MG PO TABS
10.0000 mg | ORAL_TABLET | Freq: Three times a day (TID) | ORAL | Status: DC
  Administered 2016-06-05 – 2016-06-08 (×11): 10 mg via ORAL
  Filled 2016-06-05 (×12): qty 1

## 2016-06-05 MED ORDER — POLYVINYL ALCOHOL 1.4 % OP SOLN
1.0000 [drp] | Freq: Three times a day (TID) | OPHTHALMIC | Status: DC
  Administered 2016-06-05 – 2016-06-25 (×60): 1 [drp] via OPHTHALMIC
  Filled 2016-06-05 (×3): qty 15

## 2016-06-05 MED ORDER — FAMOTIDINE 20 MG PO TABS
20.0000 mg | ORAL_TABLET | Freq: Every day | ORAL | Status: DC
  Administered 2016-06-05 – 2016-06-23 (×16): 20 mg via ORAL
  Filled 2016-06-05 (×17): qty 1

## 2016-06-05 NOTE — Progress Notes (Signed)
Pt noted sleeping with arms and legs thrashing in bed hitting side rails.  Seizure pad applied to bed.  Pt now sleeping quietly with no movement.
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
## 2016-06-06 ENCOUNTER — Encounter (HOSPITAL_COMMUNITY): Payer: Self-pay | Admitting: Radiology

## 2016-06-06 ENCOUNTER — Inpatient Hospital Stay (HOSPITAL_COMMUNITY): Payer: PPO

## 2016-06-06 DIAGNOSIS — M6281 Muscle weakness (generalized): Secondary | ICD-10-CM

## 2016-06-06 LAB — CBC
HCT: 35.8 % — ABNORMAL LOW (ref 39.0–52.0)
HEMOGLOBIN: 12.4 g/dL — AB (ref 13.0–17.0)
MCH: 32.2 pg (ref 26.0–34.0)
MCHC: 34.6 g/dL (ref 30.0–36.0)
MCV: 93 fL (ref 78.0–100.0)
Platelets: 371 10*3/uL (ref 150–400)
RBC: 3.85 MIL/uL — AB (ref 4.22–5.81)
RDW: 14 % (ref 11.5–15.5)
WBC: 7.8 10*3/uL (ref 4.0–10.5)

## 2016-06-06 LAB — BASIC METABOLIC PANEL
ANION GAP: 5 (ref 5–15)
BUN: 11 mg/dL (ref 6–20)
CO2: 30 mmol/L (ref 22–32)
Calcium: 8.7 mg/dL — ABNORMAL LOW (ref 8.9–10.3)
Chloride: 99 mmol/L — ABNORMAL LOW (ref 101–111)
Creatinine, Ser: 0.76 mg/dL (ref 0.61–1.24)
Glucose, Bld: 114 mg/dL — ABNORMAL HIGH (ref 65–99)
POTASSIUM: 3.9 mmol/L (ref 3.5–5.1)
SODIUM: 134 mmol/L — AB (ref 135–145)

## 2016-06-06 LAB — URINE CULTURE: Culture: >=100000 — AB

## 2016-06-06 LAB — D-DIMER, QUANTITATIVE: D-Dimer, Quant: 3.05 ug/mL-FEU — ABNORMAL HIGH (ref 0.00–0.50)

## 2016-06-06 MED ORDER — IOPAMIDOL (ISOVUE-370) INJECTION 76%
100.0000 mL | Freq: Once | INTRAVENOUS | Status: AC | PRN
  Administered 2016-06-06: 100 mL via INTRAVENOUS
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
## 2016-06-06 NOTE — Progress Notes (Signed)
Pharmacy Antibiotic Note  Marc Rose is a 65 y.o. male admitted on 06/03/2016 with UTI.  Pharmacy has been consulted for Levaquin dosing. UC with Enterococcus faecalis  Plan: Continue Levaquin 500mg  IV q24h F/U clinical progress  Height: 6\' 2"  (188 cm) Weight: 172 lb (78 kg) IBW/kg (Calculated) : 82.2  Temp (24hrs), Avg:99.3 F (37.4 C), Min:98.5 F (36.9 C), Max:100.3 F (37.9 C)   Recent Labs Lab 06/03/16 1040 06/03/16 1136 06/03/16 1444 06/04/16 0531 06/05/16 0731 06/06/16 0722  WBC 19.4*  --   --  12.5* 8.2 7.8  CREATININE 1.72*  --   --  0.89 0.73 0.76  LATICACIDVEN  --  2.28* 1.18  --   --   --     Estimated Creatinine Clearance: 101.6 mL/min (by C-G formula based on SCr of 0.76 mg/dL).    Allergies  Allergen Reactions  . Codeine Anaphylaxis, Hives and Swelling  . Penicillins Anaphylaxis, Hives and Swelling  . Latex Itching  . Strawberry Extract Hives    Antimicrobials this admission: levaquin 2/1 >>  Ceftriaxone x 1 dose 2/1  Microbiology results: 2/1 UCx: E faecalis 1/26 UCx: E. Faecalis s- amp,levaquin, vanc  Thank you for allowing pharmacy to be a part of this patient's care.  Excell Seltzer, PharmD Clinical Pharmacist  06/06/2016 9:45 AM

## 2016-06-07 ENCOUNTER — Inpatient Hospital Stay (HOSPITAL_COMMUNITY): Payer: PPO

## 2016-06-07 DIAGNOSIS — R000 Tachycardia, unspecified: Secondary | ICD-10-CM

## 2016-06-07 DIAGNOSIS — R260 Ataxic gait: Secondary | ICD-10-CM

## 2016-06-07 LAB — COMPREHENSIVE METABOLIC PANEL
ALT: 39 U/L (ref 17–63)
ANION GAP: 7 (ref 5–15)
AST: 33 U/L (ref 15–41)
Albumin: 2.9 g/dL — ABNORMAL LOW (ref 3.5–5.0)
Alkaline Phosphatase: 54 U/L (ref 38–126)
BUN: 14 mg/dL (ref 6–20)
CALCIUM: 8.6 mg/dL — AB (ref 8.9–10.3)
CHLORIDE: 99 mmol/L — AB (ref 101–111)
CO2: 28 mmol/L (ref 22–32)
CREATININE: 0.83 mg/dL (ref 0.61–1.24)
Glucose, Bld: 115 mg/dL — ABNORMAL HIGH (ref 65–99)
Potassium: 3.9 mmol/L (ref 3.5–5.1)
SODIUM: 134 mmol/L — AB (ref 135–145)
Total Bilirubin: 0.6 mg/dL (ref 0.3–1.2)
Total Protein: 5.5 g/dL — ABNORMAL LOW (ref 6.5–8.1)

## 2016-06-07 LAB — ECHOCARDIOGRAM COMPLETE
HEIGHTINCHES: 74 in
WEIGHTICAEL: 2752 [oz_av]

## 2016-06-07 LAB — CBC
HCT: 33.8 % — ABNORMAL LOW (ref 39.0–52.0)
Hemoglobin: 11.7 g/dL — ABNORMAL LOW (ref 13.0–17.0)
MCH: 32.2 pg (ref 26.0–34.0)
MCHC: 34.6 g/dL (ref 30.0–36.0)
MCV: 93.1 fL (ref 78.0–100.0)
PLATELETS: 402 10*3/uL — AB (ref 150–400)
RBC: 3.63 MIL/uL — ABNORMAL LOW (ref 4.22–5.81)
RDW: 14.3 % (ref 11.5–15.5)
WBC: 8.8 10*3/uL (ref 4.0–10.5)

## 2016-06-07 MED ORDER — CARVEDILOL 3.125 MG PO TABS
3.1250 mg | ORAL_TABLET | Freq: Two times a day (BID) | ORAL | Status: DC
  Administered 2016-06-07 (×2): 3.125 mg via ORAL
  Filled 2016-06-07 (×4): qty 1

## 2016-06-07 MED ORDER — LEVOFLOXACIN 500 MG PO TABS
500.0000 mg | ORAL_TABLET | Freq: Every day | ORAL | Status: DC
  Administered 2016-06-07 – 2016-06-08 (×2): 500 mg via ORAL
  Filled 2016-06-07 (×3): qty 1

## 2016-06-07 NOTE — Progress Notes (Signed)
Physical Therapy Treatment Patient Details Name: Marc Rose MRN: [id number] DOB: Feb 27, 1951 Today's Date: 06/07/2016    History of Present Illness 66 year old man with multiple lumbar/spinal surgeries, history of lumbar discitis and osteomyelitis-on chronic oral doxycycline, urinary retention requiring self bladder catheterizations since 2014, chronic lower abdominal pain, and HTN, who presented to the ED on 06/03/16 for lower abdominal pain and generalized weakness. He has had multiple surgeries on his left lower extremity. Patient has been on chronic doxycycline for chronic discitis and osteomyelitis since 2015. Patient has a urine analysis positive for infection from January 26 with culture positive for Enterococcus faecalis.     PT Comments    Pt received in bed, and was agreeable to PT tx.  Pt expressed frustration with ataxia, and states that this is something new that only started of the past 3-4 weeks.  He expressed that he is not able to feed himself because of it the ataxia, and has to have the nurses assist him with eating.  He was able to participate with [facility] for all 4 extremities with a reduction in ataxia noted with tactile cues as well as pressure through the joints.  Pt was assisted with supine<>sit and sitting balance on the EOB, but again, he is limited due to truncal ataxia.  He also strongly relies on his UE's to support his trunk when sitting on the EOB.   When attempting to work on upright sitting posture and leaning forward he demonstrates an uncontrolled extension of either one or both of his legs.  Pt expressed fatigue and required assistance to transfer back into supine position.  At that point, he told the therapist to take the "other weird people in the room with you when you go."  Pt expressed that he saw several people, and one without a head all in the room.  Continue to recommend SNF and strongly recommend both OT and SLP consult for cognition.      Follow Up  Recommendations  SNF;Supervision/Assistance - 24 hour     Equipment Recommendations       Recommendations for Other Services OT consult;Speech consult     Precautions / Restrictions Precautions Precautions: Fall Precaution Comments: 100 in the past 6 months.  L knee is popping in and out.  Pt is very ataxic  Restrictions Weight Bearing Restrictions: No    Mobility  Bed Mobility Overal bed mobility: Needs Assistance Bed Mobility: Supine to Sit     Supine to sit: Mod assist Sit to supine: Max assist   General bed mobility comments: Pt requires 1 step commands to be able to sit on the EOB, as well as tc's and assistance.  PT standing in front of pt, blocking knees due to strong ataxia in all 4 extremities.  Pt assisted with planting his hands on the bed to assist with static sitting balance.  Worked on posture and balance while sitting on the EOB.    Transfers Overall transfer level:  (NA - this pt needs +2 person assist for safety due to strong ataxic movement.)                  Ambulation/Gait                 Stairs            Wheelchair Mobility    Modified Rankin (Stroke Patients Only)       Balance Overall balance assessment: History of Falls;Needs assistance Sitting-balance support: Bilateral upper extremity supported;Feet  supported   Sitting balance - Comments: Pt continues to demonstrate truncal ataxia whiel seated on the EOB - he heavily relies on his UE's to help maintain his balance.  If he does not use his UE's, he demonstrates posterior lean.  When given vc's to sit up and bring chest forward he demonstrates accessory movement in his LE's - kicking them up.  Pt requires manual assistance to place feet back on the floor and underneath his knees.                              Cognition Arousal/Alertness: Awake/alert Behavior During Therapy: Impulsive;Anxious;Restless Overall Cognitive Status: Impaired/Different from baseline                  General Comments: Pt is having visual hallucinations of people in the room that are not actually present, pt states he is able to see several "weird" people, including one without a head.      Exercises General Exercises - Upper Extremity Shoulder Flexion: AAROM;Both;10 reps;Supine Elbow Flexion: AAROM;Both;10 reps;Supine General Exercises - Lower Extremity Ankle Circles/Pumps: AAROM;Both;10 reps;Supine Heel Slides: AAROM;Both;10 reps;Supine    General Comments        Pertinent Vitals/Pain Pain Assessment: 0-10 Pain Score: 7  Pain Location: mid back.  Pain Descriptors / Indicators: Sharp Pain Intervention(s): Limited activity within patient's tolerance;Monitored during session;Repositioned    Home Living                      Prior Function            PT Goals (current goals can now be found in the care plan section) Acute Rehab PT Goals Patient Stated Goal: None stated Potential to Achieve Goals: Fair Progress towards PT goals: Not progressing toward goals - comment (Due to extensive ataxia, as well as cognitive issues with hallucinations. )    Frequency    Min 3X/week      PT Plan Current plan remains appropriate    Co-evaluation             End of Session Equipment Utilized During Treatment: Gait belt Activity Tolerance: Patient tolerated treatment well Patient left: in bed;with call bell/phone within reach;with bed alarm set     Time: 1451-1526 PT Time Calculation (min) (ACUTE ONLY): 35 min  Charges:  $Therapeutic Exercise: 8-22 mins $Therapeutic Activity: 8-22 mins                    G Codes:      Beth Miraj Truss, PT, DPT X: [phone]

## 2016-06-07 NOTE — NC FL2 (Deleted)
[place] LEVEL OF CARE SCREENING TOOL     IDENTIFICATION  Patient Name: Marc Rose Birthdate: Jul 06, 1950 Sex: male Admission Date (Current Location): 06/03/2016  [facility] and [place] Number:  [employer] and Address:  [place] [address] [place]      Provider Number: [id number]  Attending Physician Name and Address:  Jani Gravel, MD  Relative Name and Phone Number:       Current Level of Care: Hospital Recommended Level of Care: [place] Prior Approval Number:    Date Approved/Denied:   PASRR Number: [id number]  Discharge Plan: SNF    Current Diagnoses: Patient Active Problem List   Diagnosis Date Noted  . Ataxic gait   . Tachycardia   . Muscle weakness (generalized)   . 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 AKI (acute kidney injury) ([place]) 06/04/2016  . UTI (urinary tract infection) due to Enterococcus 06/03/2016  . Drug rash 11/12/2015  . Hyperpigmentation 05/15/2015  . Abdominal pain, generalized 02/05/2015  . Abdominal pain, right lower quadrant 02/05/2015  . Abscess, psoas ([place]) 02/05/2015  . Acute upper respiratory infection 02/05/2015  . Long term current use of antibiotics 02/05/2015  . Benign essential HTN 02/05/2015  . Edema leg 02/05/2015  . Complex regional pain syndrome type 2 of upper extremity 02/05/2015  . Back pain, chronic 02/05/2015  . CN (constipation) 02/05/2015  . CD (contact dermatitis) 02/05/2015  . Elevated fasting blood sugar 02/05/2015  . LBP (low back pain) 02/05/2015  . Discitis of lumbar region 02/05/2015  . Disorder of nutrition 02/05/2015  . Feeling bilious 02/05/2015  . Flu vaccine need 02/05/2015  . Peripheral neuropathic pain 02/05/2015  . Non compliance with medical treatment 02/05/2015  . Abnormal blood chemistry 02/05/2015  . Angiopathy, peripheral ([place]) 02/05/2015  . Nerve disease, peripheral ([place]) 02/05/2015   . Hypercholesterolemia without hypertriglyceridemia 02/05/2015  . Spinal stenosis 02/05/2015  . Buzzing in ear 02/05/2015  . Compulsive tobacco user syndrome 02/05/2015  . Bladder retention 02/05/2015  . Abnormal weight loss 02/05/2015  . Diskitis 02/05/2015  . Hardware complicating wound infection ([place]) 02/05/2015  . Hyperlipidemia 07/05/2014  . Peripheral arterial disease ([place]) 07/05/2014  . Hypotension 09/16/2013  . HTN (hypertension) 09/16/2013  . Anxiety 09/16/2013  . Back pain 09/16/2013    Orientation RESPIRATION BLADDER Height & Weight     Self, Time, Situation, Place  Normal Indwelling catheter Weight: 172 lb (78 kg) Height:  6\' 2"  (188 cm)  BEHAVIORAL SYMPTOMS/MOOD NEUROLOGICAL BOWEL NUTRITION STATUS  Other (Comment) (none)  (n/a) Continent Diet (Heart healthy)  AMBULATORY STATUS COMMUNICATION OF NEEDS Skin   Total Care Verbally Skin abrasions                       Personal Care Assistance Level of Assistance  Bathing, Feeding, Dressing Bathing Assistance: Maximum assistance Feeding assistance: Limited assistance Dressing Assistance: Maximum assistance     Functional Limitations Info  Sight, Hearing, Speech Sight Info: Impaired Hearing Info: Adequate Speech Info: Adequate    SPECIAL CARE FACTORS FREQUENCY  PT (By licensed PT)     PT Frequency: 5              Contractures      Additional Factors Info  Psychotropic Code Status Info: Full code Allergies Info: Codeine, Penicillins, Latex, Strawberry extract Psychotropic Info: Valium         Current Medications (06/07/2016):  This  is the current hospital active medication list Current Facility-Administered Medications  Medication Dose Route Frequency Provider Last Rate Last Dose  . acetaminophen (TYLENOL) tablet 650 mg  650 mg Oral Q6H PRN Tawni Millers, MD       Or  . acetaminophen (TYLENOL) suppository 650 mg  650 mg Rectal Q6H PRN Tawni Millers, MD      . aspirin EC  tablet 81 mg  81 mg Oral Daily Tawni Millers, MD   81 mg at 06/06/16 0914  . benazepril (LOTENSIN) tablet 20 mg  20 mg Oral Daily Rexene Alberts, MD   20 mg at 06/06/16 0914  . carvedilol (COREG) tablet 3.125 mg  3.125 mg Oral BID WC Jani Gravel, MD      . cyclobenzaprine (FLEXERIL) tablet 10 mg  10 mg Oral TID Rexene Alberts, MD   10 mg at 06/06/16 2102  . diazepam (VALIUM) tablet 10 mg  10 mg Oral QID PRN Mauricio Gerome Apley, MD   10 mg at 06/07/16 0151  . famotidine (PEPCID) tablet 20 mg  20 mg Oral Daily Rexene Alberts, MD   20 mg at 06/06/16 0915  . fluticasone (FLONASE) 50 MCG/ACT nasal spray 1 spray  1 spray Each Nare Daily PRN Tawni Millers, MD   1 spray at 06/06/16 2121  . heparin injection 5,000 Units  5,000 Units Subcutaneous Q8H Mauricio Gerome Apley, MD   5,000 Units at 06/07/16 0620  . levofloxacin (LEVAQUIN) tablet 500 mg  500 mg Oral Daily Jani Gravel, MD      . magnesium hydroxide (MILK OF MAGNESIA) suspension 5 mL  5 mL Oral Daily PRN Rexene Alberts, MD      . ondansetron [facility]) tablet 4 mg  4 mg Oral Q6H PRN Mauricio Gerome Apley, MD       Or  . ondansetron [facility]) injection 4 mg  4 mg Intravenous Q6H PRN Tawni Millers, MD      . polyvinyl alcohol (LIQUIFILM TEARS) 1.4 % ophthalmic solution 1 drop  1 drop Both Eyes TID Rexene Alberts, MD   1 drop at 06/06/16 2103  . pregabalin (LYRICA) capsule 75 mg  75 mg Oral BID Tawni Millers, MD   75 mg at 06/06/16 2102  . senna-docusate (Senokot-S) tablet 1 tablet  1 tablet Oral QHS Rexene Alberts, MD   1 tablet at 06/06/16 2102  . tamsulosin (FLOMAX) capsule 0.4 mg  0.4 mg Oral QPC supper Tawni Millers, MD   0.4 mg at 06/06/16 1722     Discharge Medications: Please see discharge summary for a list of discharge medications.  Relevant Imaging Results:  Relevant Lab Results:   Additional Information SSN: [id number]  Salome Arnt, [place]

## 2016-06-07 NOTE — NC FL2 (Signed)
[place] LEVEL OF CARE SCREENING TOOL     IDENTIFICATION  Patient Name: Marc Rose Birthdate: 1951/01/03 Sex: male Admission Date (Current Location): 06/03/2016  [facility] and [place] Number:  [employer] and Address:  [place] [address] [place]      Provider Number: [id number]  Attending Physician Name and Address:  Jani Gravel, MD  Relative Name and Phone Number:       Current Level of Care: Hospital Recommended Level of Care: [place] Prior Approval Number:    Date Approved/Denied:   PASRR Number: [id number]  Discharge Plan: SNF    Current Diagnoses: Patient Active Problem List   Diagnosis Date Noted  . Ataxic gait   . Tachycardia   . Muscle weakness (generalized)   . 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 AKI (acute kidney injury) ([place]) 06/04/2016  . UTI (urinary tract infection) due to Enterococcus 06/03/2016  . Drug rash 11/12/2015  . Hyperpigmentation 05/15/2015  . Abdominal pain, generalized 02/05/2015  . Abdominal pain, right lower quadrant 02/05/2015  . Abscess, psoas ([place]) 02/05/2015  . Acute upper respiratory infection 02/05/2015  . Long term current use of antibiotics 02/05/2015  . Benign essential HTN 02/05/2015  . Edema leg 02/05/2015  . Complex regional pain syndrome type 2 of upper extremity 02/05/2015  . Back pain, chronic 02/05/2015  . CN (constipation) 02/05/2015  . CD (contact dermatitis) 02/05/2015  . Elevated fasting blood sugar 02/05/2015  . LBP (low back pain) 02/05/2015  . Discitis of lumbar region 02/05/2015  . Disorder of nutrition 02/05/2015  . Feeling bilious 02/05/2015  . Flu vaccine need 02/05/2015  . Peripheral neuropathic pain 02/05/2015  . Non compliance with medical treatment 02/05/2015  . Abnormal blood chemistry 02/05/2015  . Angiopathy, peripheral ([place]) 02/05/2015  . Nerve disease, peripheral ([place]) 02/05/2015   . Hypercholesterolemia without hypertriglyceridemia 02/05/2015  . Spinal stenosis 02/05/2015  . Buzzing in ear 02/05/2015  . Compulsive tobacco user syndrome 02/05/2015  . Bladder retention 02/05/2015  . Abnormal weight loss 02/05/2015  . Diskitis 02/05/2015  . Hardware complicating wound infection ([place]) 02/05/2015  . Hyperlipidemia 07/05/2014  . Peripheral arterial disease ([place]) 07/05/2014  . Hypotension 09/16/2013  . HTN (hypertension) 09/16/2013  . Anxiety 09/16/2013  . Back pain 09/16/2013    Orientation RESPIRATION BLADDER Height & Weight     Self, Place  Normal Indwelling catheter Weight: 172 lb (78 kg) Height:  6\' 2"  (188 cm)  BEHAVIORAL SYMPTOMS/MOOD NEUROLOGICAL BOWEL NUTRITION STATUS  Other (Comment) (none)  (n/a) Continent Diet (Heart healthy)  AMBULATORY STATUS COMMUNICATION OF NEEDS Skin   Total Care Verbally Skin abrasions                       Personal Care Assistance Level of Assistance  Bathing, Feeding, Dressing Bathing Assistance: Maximum assistance Feeding assistance: Limited assistance Dressing Assistance: Maximum assistance     Functional Limitations Info  Sight, Hearing, Speech Sight Info: Impaired Hearing Info: Adequate Speech Info: Adequate    SPECIAL CARE FACTORS FREQUENCY  PT (By licensed PT)     PT Frequency: 5              Contractures      Additional Factors Info  Psychotropic Code Status Info: Full code Allergies Info: Codeine, Penicillins, Latex, Strawberry extract Psychotropic Info: Valium         Current Medications (06/07/2016):  This is the  current hospital active medication list Current Facility-Administered Medications  Medication Dose Route Frequency Provider Last Rate Last Dose  . acetaminophen (TYLENOL) tablet 650 mg  650 mg Oral Q6H PRN Tawni Millers, MD       Or  . acetaminophen (TYLENOL) suppository 650 mg  650 mg Rectal Q6H PRN Tawni Millers, MD      . aspirin EC tablet 81 mg  81 mg  Oral Daily Tawni Millers, MD   81 mg at 06/07/16 1104  . benazepril (LOTENSIN) tablet 20 mg  20 mg Oral Daily Rexene Alberts, MD   20 mg at 06/07/16 1104  . carvedilol (COREG) tablet 3.125 mg  3.125 mg Oral BID WC Jani Gravel, MD   3.125 mg at 06/07/16 1104  . cyclobenzaprine (FLEXERIL) tablet 10 mg  10 mg Oral TID Rexene Alberts, MD   10 mg at 06/07/16 1104  . diazepam (VALIUM) tablet 10 mg  10 mg Oral QID PRN Mauricio Gerome Apley, MD   10 mg at 06/07/16 0151  . famotidine (PEPCID) tablet 20 mg  20 mg Oral Daily Rexene Alberts, MD   20 mg at 06/07/16 1104  . fluticasone (FLONASE) 50 MCG/ACT nasal spray 1 spray  1 spray Each Nare Daily PRN Tawni Millers, MD   1 spray at 06/06/16 2121  . heparin injection 5,000 Units  5,000 Units Subcutaneous Q8H Mauricio Gerome Apley, MD   5,000 Units at 06/07/16 0620  . levofloxacin (LEVAQUIN) tablet 500 mg  500 mg Oral Daily Jani Gravel, MD   500 mg at 06/07/16 1103  . magnesium hydroxide (MILK OF MAGNESIA) suspension 5 mL  5 mL Oral Daily PRN Rexene Alberts, MD      . ondansetron [facility]) tablet 4 mg  4 mg Oral Q6H PRN Mauricio Gerome Apley, MD       Or  . ondansetron [facility]) injection 4 mg  4 mg Intravenous Q6H PRN Mauricio Gerome Apley, MD      . polyvinyl alcohol (LIQUIFILM TEARS) 1.4 % ophthalmic solution 1 drop  1 drop Both Eyes TID Rexene Alberts, MD   1 drop at 06/07/16 1105  . pregabalin (LYRICA) capsule 75 mg  75 mg Oral BID Tawni Millers, MD   75 mg at 06/07/16 1104  . senna-docusate (Senokot-S) tablet 1 tablet  1 tablet Oral QHS Rexene Alberts, MD   1 tablet at 06/06/16 2102  . tamsulosin (FLOMAX) capsule 0.4 mg  0.4 mg Oral QPC supper Tawni Millers, MD   0.4 mg at 06/06/16 1722     Discharge Medications: Please see discharge summary for a list of discharge medications.  Relevant Imaging Results:  Relevant Lab Results:   Additional Information SSN: [id number]  Salome Arnt,  [place]

## 2016-06-07 NOTE — Clinical Social Work Note (Signed)
Clinical Social Work Assessment  Patient Details  Name: Marc Rose MRN: [id number] Date of Birth: 07-20-1950  Date of referral:  06/07/16               Reason for consult:  Discharge Planning                Permission sought to share information with:    Permission granted to share information::  Yes, Verbal Permission Granted  Name::     Volney Presser  Agency::     Relationship::  friend  Contact Information:     Housing/Transportation Living arrangements for the past 2 months:  Single Family Home Source of Information:  Patient Patient Interpreter Needed:  None Criminal Activity/Legal Involvement Pertinent to Current Situation/Hospitalization:  No - Comment as needed Significant Relationships:  Friend Lives with:  Self Do you feel safe going back to the place where you live?   (needs rehab first) Need for family participation in patient care:  No (Coment)  Care giving concerns:  Pt lives alone and is requiring extensive assist.    Facilities manager / plan:  CSW met with pt at bedside. Pt alert and oriented to self, place, and somewhat to situation. He reports he lives alone and manages fine on his own. At baseline, pt ambulates with a cane and still drives. He states his wife died in 37 and his children live out of state. His best support is friends and they check on him regularly. Pt particularly mentioned Freida who he said is an Therapist, sports. CSW left voicemail for Volney Presser with pt's permission. Pt states he fell at home and his friends finally convinced him to go to the hospital. CSW discussed PT evaluation and recommendation for SNF. He indicates he was aware of this and admits that he will require placement. Aware of insurance authorization requirement. Pt requests [facility] or would also consider [facility] if needed. CSW started authorization.    Employment status:  Retired Nurse, adult PT Recommendations:  [place] / Referral  to community resources:  [place]  Patient/Family's Response to care:  Pt is agreeable to short term rehab prior to return home.   Patient/Family's Understanding of and Emotional Response to Diagnosis, Current Treatment, and Prognosis:  Pt has some confusion, but appeared to be aware of admission diagnosis.   Emotional Assessment Appearance:  Appears stated age Attitude/Demeanor/Rapport:  Other (Cooperative) Affect (typically observed):  Accepting Orientation:  Oriented to Self, Oriented to Place, Oriented to Situation Alcohol / Substance use:  Not Applicable Psych involvement (Current and /or in the community):  No (Comment)  Discharge Needs  Concerns to be addressed:  Discharge Planning Concerns Readmission within the last 30 days:  No Current discharge risk:  Physical Impairment, Lives alone Barriers to Discharge:  Continued Medical Work up   Salome Arnt, [place] 06/07/2016, 11:46 AM [phone]

## 2016-06-07 NOTE — Care Management Note (Signed)
Case Management Note  Patient Details  Name: Marc Rose MRN: [id number] Date of Birth: [birth year]  Subjective/Objective:                  Pt admitted with AMS and UTI. Pt is from home and PT has recommended SNF. Pt agreeable, CSW working with pt on placement arrangements.   Action/Plan: Anticipate DC to SNF tomorrow. No CM needs.   Expected Discharge Date:  06/08/2016               Expected Discharge Plan:  [place]  In-House Referral:  Clinical Social Work  Discharge planning Services  CM Consult  Post Acute Care Choice:  NA Choice offered to:  NA  Status of Service:  Completed, signed off  Sherald Barge, RN 06/07/2016, 3:47 PM

## 2016-06-07 NOTE — Progress Notes (Signed)
*  PRELIMINARY RESULTS* Echocardiogram 2D Echocardiogram has been performed.  Samuel Germany 06/07/2016, 12:30 PM

## 2016-06-07 NOTE — NC FL2 (Deleted)
[place] LEVEL OF CARE SCREENING TOOL     IDENTIFICATION  Patient Name: Marc Rose Birthdate: January 06, 1951 Sex: male Admission Date (Current Location): 06/03/2016  [facility] and [place] Number:  [employer] and Address:  [place] [address] [place]      Provider Number: [id number]  Attending Physician Name and Address:  Jani Gravel, MD  Relative Name and Phone Number:       Current Level of Care: Hospital Recommended Level of Care: [place] Prior Approval Number:    Date Approved/Denied:   PASRR Number: [id number]  Discharge Plan: SNF    Current Diagnoses: Patient Active Problem List   Diagnosis Date Noted  . Ataxic gait   . Tachycardia   . Muscle weakness (generalized)   . 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 AKI (acute kidney injury) ([place]) 06/04/2016  . UTI (urinary tract infection) due to Enterococcus 06/03/2016  . Drug rash 11/12/2015  . Hyperpigmentation 05/15/2015  . Abdominal pain, generalized 02/05/2015  . Abdominal pain, right lower quadrant 02/05/2015  . Abscess, psoas ([place]) 02/05/2015  . Acute upper respiratory infection 02/05/2015  . Long term current use of antibiotics 02/05/2015  . Benign essential HTN 02/05/2015  . Edema leg 02/05/2015  . Complex regional pain syndrome type 2 of upper extremity 02/05/2015  . Back pain, chronic 02/05/2015  . CN (constipation) 02/05/2015  . CD (contact dermatitis) 02/05/2015  . Elevated fasting blood sugar 02/05/2015  . LBP (low back pain) 02/05/2015  . Discitis of lumbar region 02/05/2015  . Disorder of nutrition 02/05/2015  . Feeling bilious 02/05/2015  . Flu vaccine need 02/05/2015  . Peripheral neuropathic pain 02/05/2015  . Non compliance with medical treatment 02/05/2015  . Abnormal blood chemistry 02/05/2015  . Angiopathy, peripheral ([place]) 02/05/2015  . Nerve disease, peripheral ([place]) 02/05/2015   . Hypercholesterolemia without hypertriglyceridemia 02/05/2015  . Spinal stenosis 02/05/2015  . Buzzing in ear 02/05/2015  . Compulsive tobacco user syndrome 02/05/2015  . Bladder retention 02/05/2015  . Abnormal weight loss 02/05/2015  . Diskitis 02/05/2015  . Hardware complicating wound infection ([place]) 02/05/2015  . Hyperlipidemia 07/05/2014  . Peripheral arterial disease ([place]) 07/05/2014  . Hypotension 09/16/2013  . HTN (hypertension) 09/16/2013  . Anxiety 09/16/2013  . Back pain 09/16/2013    Orientation RESPIRATION BLADDER Height & Weight     Self  Normal Indwelling catheter Weight: 172 lb (78 kg) Height:  6\' 2"  (188 cm)  BEHAVIORAL SYMPTOMS/MOOD NEUROLOGICAL BOWEL NUTRITION STATUS  Other (Comment) (none)  (n/a) Continent Diet (Heart healthy)  AMBULATORY STATUS COMMUNICATION OF NEEDS Skin   Total Care Verbally Skin abrasions                       Personal Care Assistance Level of Assistance  Bathing, Feeding, Dressing Bathing Assistance: Maximum assistance Feeding assistance: Limited assistance Dressing Assistance: Maximum assistance     Functional Limitations Info  Sight, Hearing, Speech Sight Info: Impaired Hearing Info: Adequate Speech Info: Adequate    SPECIAL CARE FACTORS FREQUENCY  PT (By licensed PT)     PT Frequency: 5              Contractures      Additional Factors Info  Psychotropic Code Status Info: Full code Allergies Info: Codeine, Penicillins, Latex, Strawberry extract Psychotropic Info: Valium         Current Medications (06/07/2016):  This is the current  hospital active medication list Current Facility-Administered Medications  Medication Dose Route Frequency Provider Last Rate Last Dose  . acetaminophen (TYLENOL) tablet 650 mg  650 mg Oral Q6H PRN Tawni Millers, MD       Or  . acetaminophen (TYLENOL) suppository 650 mg  650 mg Rectal Q6H PRN Tawni Millers, MD      . aspirin EC tablet 81 mg  81 mg Oral  Daily Tawni Millers, MD   81 mg at 06/06/16 0914  . benazepril (LOTENSIN) tablet 20 mg  20 mg Oral Daily Rexene Alberts, MD   20 mg at 06/06/16 0914  . carvedilol (COREG) tablet 3.125 mg  3.125 mg Oral BID WC Jani Gravel, MD      . cyclobenzaprine (FLEXERIL) tablet 10 mg  10 mg Oral TID Rexene Alberts, MD   10 mg at 06/06/16 2102  . diazepam (VALIUM) tablet 10 mg  10 mg Oral QID PRN Mauricio Gerome Apley, MD   10 mg at 06/07/16 0151  . famotidine (PEPCID) tablet 20 mg  20 mg Oral Daily Rexene Alberts, MD   20 mg at 06/06/16 0915  . fluticasone (FLONASE) 50 MCG/ACT nasal spray 1 spray  1 spray Each Nare Daily PRN Tawni Millers, MD   1 spray at 06/06/16 2121  . heparin injection 5,000 Units  5,000 Units Subcutaneous Q8H Mauricio Gerome Apley, MD   5,000 Units at 06/07/16 0620  . levofloxacin (LEVAQUIN) tablet 500 mg  500 mg Oral Daily Jani Gravel, MD      . magnesium hydroxide (MILK OF MAGNESIA) suspension 5 mL  5 mL Oral Daily PRN Rexene Alberts, MD      . ondansetron [facility]) tablet 4 mg  4 mg Oral Q6H PRN Mauricio Gerome Apley, MD       Or  . ondansetron [facility]) injection 4 mg  4 mg Intravenous Q6H PRN Tawni Millers, MD      . polyvinyl alcohol (LIQUIFILM TEARS) 1.4 % ophthalmic solution 1 drop  1 drop Both Eyes TID Rexene Alberts, MD   1 drop at 06/06/16 2103  . pregabalin (LYRICA) capsule 75 mg  75 mg Oral BID Tawni Millers, MD   75 mg at 06/06/16 2102  . senna-docusate (Senokot-S) tablet 1 tablet  1 tablet Oral QHS Rexene Alberts, MD   1 tablet at 06/06/16 2102  . tamsulosin (FLOMAX) capsule 0.4 mg  0.4 mg Oral QPC supper Tawni Millers, MD   0.4 mg at 06/06/16 1722     Discharge Medications: Please see discharge summary for a list of discharge medications.  Relevant Imaging Results:  Relevant Lab Results:   Additional Information SSN: [id number]  Salome Arnt, [place]

## 2016-06-07 NOTE — Clinical Social Work Placement (Signed)
   CLINICAL SOCIAL WORK PLACEMENT  NOTE  Date:  06/07/2016  Patient Details  Name: Marc Rose MRN: [id number] Date of Birth: [birth year]  Clinical Social Work is seeking post-discharge placement for this patient at the [place] level of care (*CSW will initial, date and re-position this form in  chart as items are completed):  Yes   Patient/family provided with [place] Work Department's list of facilities offering this level of care within the geographic area requested by the patient (or if unable, by the patient's family).  Yes   Patient/family informed of their freedom to choose among providers that offer the needed level of care, that participate in Medicare, Medicaid or managed care program needed by the patient, have an available bed and are willing to accept the patient.  Yes   Patient/family informed of [facility]'s ownership interest in [facility] and [facility], as well as of the fact that they are under no obligation to receive care at these facilities.  PASRR submitted to EDS on       PASRR number received on       Existing PASRR number confirmed on 06/07/16     FL2 transmitted to all facilities in geographic area requested by pt/family on 06/07/16     FL2 transmitted to all facilities within larger geographic area on       Patient informed that his/her managed care company has contracts with or will negotiate with certain facilities, including the following:            Patient/family informed of bed offers received.  Patient chooses bed at       Physician recommends and patient chooses bed at      Patient to be transferred to   on  .  Patient to be transferred to facility by       Patient family notified on   of transfer.  Name of family member notified:        PHYSICIAN       Additional Comment:    _______________________________________________ Salome Arnt, [place] 06/07/2016, 9:54  AM [phone]
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
## 2016-06-08 DIAGNOSIS — R4182 Altered mental status, unspecified: Secondary | ICD-10-CM | POA: Diagnosis not present

## 2016-06-08 DIAGNOSIS — N179 Acute kidney failure, unspecified: Secondary | ICD-10-CM

## 2016-06-08 DIAGNOSIS — R270 Ataxia, unspecified: Secondary | ICD-10-CM

## 2016-06-08 DIAGNOSIS — I10 Essential (primary) hypertension: Secondary | ICD-10-CM

## 2016-06-08 DIAGNOSIS — R2689 Other abnormalities of gait and mobility: Secondary | ICD-10-CM | POA: Diagnosis not present

## 2016-06-08 DIAGNOSIS — B952 Enterococcus as the cause of diseases classified elsewhere: Secondary | ICD-10-CM

## 2016-06-08 DIAGNOSIS — N390 Urinary tract infection, site not specified: Secondary | ICD-10-CM

## 2016-06-08 DIAGNOSIS — G629 Polyneuropathy, unspecified: Secondary | ICD-10-CM | POA: Diagnosis not present

## 2016-06-08 DIAGNOSIS — M6281 Muscle weakness (generalized): Secondary | ICD-10-CM

## 2016-06-08 LAB — C-REACTIVE PROTEIN: CRP: 8.7 mg/dL — AB (ref <1.0)

## 2016-06-08 LAB — COMPREHENSIVE METABOLIC PANEL
ALK PHOS: 59 U/L (ref 38–126)
ALT: 41 U/L (ref 17–63)
AST: 42 U/L — ABNORMAL HIGH (ref 15–41)
Albumin: 2.8 g/dL — ABNORMAL LOW (ref 3.5–5.0)
Anion gap: 7 (ref 5–15)
BUN: 22 mg/dL — ABNORMAL HIGH (ref 6–20)
CALCIUM: 8.7 mg/dL — AB (ref 8.9–10.3)
CHLORIDE: 102 mmol/L (ref 101–111)
CO2: 27 mmol/L (ref 22–32)
CREATININE: 0.98 mg/dL (ref 0.61–1.24)
Glucose, Bld: 109 mg/dL — ABNORMAL HIGH (ref 65–99)
Potassium: 4.3 mmol/L (ref 3.5–5.1)
SODIUM: 136 mmol/L (ref 135–145)
Total Bilirubin: 0.8 mg/dL (ref 0.3–1.2)
Total Protein: 5.4 g/dL — ABNORMAL LOW (ref 6.5–8.1)

## 2016-06-08 LAB — CBC
HCT: 33.6 % — ABNORMAL LOW (ref 39.0–52.0)
Hemoglobin: 11.7 g/dL — ABNORMAL LOW (ref 13.0–17.0)
MCH: 32.7 pg (ref 26.0–34.0)
MCHC: 34.8 g/dL (ref 30.0–36.0)
MCV: 93.9 fL (ref 78.0–100.0)
PLATELETS: 405 10*3/uL — AB (ref 150–400)
RBC: 3.58 MIL/uL — ABNORMAL LOW (ref 4.22–5.81)
RDW: 14.5 % (ref 11.5–15.5)
WBC: 10.1 10*3/uL (ref 4.0–10.5)

## 2016-06-08 LAB — TSH: TSH: 5.323 u[IU]/mL — AB (ref 0.350–4.500)

## 2016-06-08 LAB — SEDIMENTATION RATE: SED RATE: 33 mm/h — AB (ref 0–16)

## 2016-06-08 LAB — VITAMIN B12: Vitamin B-12: 605 pg/mL (ref 180–914)

## 2016-06-08 MED ORDER — SODIUM CHLORIDE 0.9 % IV BOLUS (SEPSIS)
500.0000 mL | Freq: Once | INTRAVENOUS | Status: AC
  Administered 2016-06-08: 500 mL via INTRAVENOUS

## 2016-06-08 MED ORDER — THIAMINE HCL 100 MG/ML IJ SOLN
100.0000 mg | Freq: Every day | INTRAMUSCULAR | Status: DC
  Administered 2016-06-08 – 2016-06-19 (×12): 100 mg via INTRAVENOUS
  Filled 2016-06-08 (×12): qty 2

## 2016-06-08 MED ORDER — IMMUNE GLOBULIN (HUMAN) 10 GM/100ML IV SOLN
400.0000 mg/kg | INTRAVENOUS | Status: AC
  Administered 2016-06-08 – 2016-06-12 (×5): 30 g via INTRAVENOUS
  Filled 2016-06-08: qty 100
  Filled 2016-06-08: qty 300
  Filled 2016-06-08: qty 100
  Filled 2016-06-08 (×2): qty 200

## 2016-06-08 NOTE — Progress Notes (Signed)
Day RN aware to hold AM Carvedilol and Lotensin per MD orders.

## 2016-06-08 NOTE — Progress Notes (Signed)
Pharmacy Antibiotic Note  Marc Rose is a 66 y.o. male admitted on 06/03/2016 with UTI.  Pharmacy has been consulted for Levaquin dosing. UC with Enterococcus faecalis  Plan: Continue Levaquin 500mg  PO q24h F/U clinical progress  Height: 6\' 2"  (188 cm) Weight: 172 lb (78 kg) IBW/kg (Calculated) : 82.2  Temp (24hrs), Avg:98.9 F (37.2 C), Min:98.6 F (37 C), Max:99.1 F (37.3 C)   Recent Labs Lab 06/03/16 1136 06/03/16 1444 06/04/16 0531 06/05/16 0731 06/06/16 0722 06/07/16 0524 06/08/16 0526  WBC  --   --  12.5* 8.2 7.8 8.8 10.1  CREATININE  --   --  0.89 0.73 0.76 0.83 0.98  LATICACIDVEN 2.28* 1.18  --   --   --   --   --     Estimated Creatinine Clearance: 82.9 mL/min (by C-G formula based on SCr of 0.98 mg/dL).    Allergies  Allergen Reactions  . Codeine Anaphylaxis, Hives and Swelling  . Penicillins Anaphylaxis, Hives and Swelling  . Latex Itching  . Strawberry Extract Hives    Antimicrobials this admission: levaquin 2/1 >>  Ceftriaxone x 1 dose 2/1  Microbiology results: 2/1 UCx: E faecalis 1/26 UCx: E. Faecalis s- amp,levaquin, vanc  Urine culture [id number] (Abnormal)  Collected: 06/03/16 1259  Order Status: Completed Specimen: Urine from Urine, Clean Catch Updated: 06/06/16 0732   Specimen Description URINE, CLEAN CATCH   Special Requests NONE   Culture >=100,000 COLONIES/mL ENTEROCOCCUS FAECALIS (A)   Report Status 06/06/2016 FINAL   Organism ID, Bacteria ENTEROCOCCUS FAECALIS (A)  Culture & Susceptibility   ENTEROCOCCUS FAECALIS   Antibiotic Sensitivity Microscan Status  AMPICILLIN Sensitive <=2 SENSITIVE Final  Method: MIC  LEVOFLOXACIN Sensitive 1 SENSITIVE Final  Method: MIC  NITROFURANTOIN Sensitive <=16 SENSITIVE Final  Method: MIC  VANCOMYCIN Sensitive 1 SENSITIVE Final  Method: MIC  Comments ENTEROCOCCUS FAECALIS (MIC)   >=100,000 COLONIES/mL ENTEROCOCCUS FAECALIS           Thank you for allowing pharmacy to be a part  of this patient's care.  Hart Robinsons, PharmD Clinical Pharmacist Pager:  [phone] 06/08/2016   06/08/2016 11:20 AM
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
## 2016-06-08 NOTE — Consult Note (Addendum)
Mertens A. [name], MD     [email or web address]          FEDERICK Rose is an 66 y.o. male.   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 The patient's symptoms are very severe and therefore I think I will recommend treatment with IV immunoglobulins. How therefore recommend standard treatment of 400 mg/kg for 5 days. The patient should have physical and occupational therapy. Spinal tap will also be arranged to have with the workup. Extensive labs also be done to rule out for other potential mimickers such as vasculitis. The patient seemed to be on the nurse suggestive of possible thiamine deficiency. Thiamine replacement will also be started.  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 The patient apparently also has a UTI which make explain some of his symptoms including the encephalopathy.  Low back pain status post L1-L2 laminectomy.  The patient is 66 year old white male who presents with a week history of severe gait impairment and leg weakness. The patient also complains of abdominal pain. He has had multiple falls with skin abrasions and lacerations involving the legs. The patient lives alone. The workup was significant for UTI otherwise imaging of the brain is unremarkable. The patient is moderately confused during the evaluation with nonsensical speaking and some confabulation. The history is therefore difficult. The review of systems is quite difficult given the confusion.  GENERAL: This is a pleasant man who is somewhat restless. He is disheveled appearing and appears to be on the nurse.  HEENT: Supple. Atraumatic normocephalic. Dense cataracts both sides.  ABDOMEN: soft  EXTREMITIES:  No edema. Multiple abrasions and lacerations involving the knees and legs.   BACK: Normal.  SKIN: Normal by inspection.    MENTAL STATUS: He is awake and alert. He has some confabulation and nonsensical speech at time. He does reorient however with prompting. He know that it is 2018. He also states that it is January. He is not oriented to location or why he is in the hospital.  CRANIAL NERVES: Pupils are equal, round and reactive to light; extra ocular movements shows limited upgaze, there is full movements to the left but impaired movements to the right involving both eyes with a left going about 80% over and the right not crossing midline even passively; there is no significant nystagmus; visual fields are full; upper and lower facial muscles are normal in strength and symmetric, there is no flattening of the nasolabial folds; tongue is midline; uvula is midline; shoulder elevation is normal.  MOTOR: Normal tone, bulk and strength; no pronator drift.  COORDINATION: The patient has severe truncal and appendicular ataxia. He is not able to sit up first maintain his posture. He again has severe appendicular ataxia involving the upper and lower extremities.  REFLEXES: The patient is areflexic in the legs and upper extremities.  SENSATION: Normal to pain.    Blood pressure 103/70, pulse 93, temperature 98.6 F (37 C), temperature source Oral, resp. rate (!) 22, height '6\' 2"'  (1.88 m), weight 172 lb (78 kg), SpO2 92 %.  Past Medical History:  Diagnosis Date  . Anxiety state, unspecified   . Arthritis   . 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 Benign neoplasm of colon   . Causalgia of upper limb    Left  limb  . Chronic back pain   . Chronic leg pain   . Depression   . Diabetes mellitus   . Diskitis 02/05/2015  . Drug rash 11/12/2015  . Elevated blood pressure reading without diagnosis of hypertension   . Essential hypertension, benign   . Hardware complicating wound infection ([place]) 02/05/2015  .  Hyperpigmentation 05/15/2015  . Hypertension   . Impaired fasting glucose   . Peripheral arterial disease ([place])   . Pure hypercholesterolemia   . Tobacco use disorder   . Unspecified retinal detachment     Past Surgical History:  Procedure Laterality Date  . ANKLE SURGERY     left  . APPENDECTOMY    . BACK SURGERY    . COLONOSCOPY    . KNEE SURGERY     left    Family History  Problem Relation Age of Onset  . COPD Mother   . Hyperthyroidism Mother   . Hyperthyroidism Sister     Social History:  reports that he has been smoking Cigarettes.  He has a 19.50 pack-year smoking history. He has never used smokeless tobacco. He reports that he uses drugs, including Marijuana. He reports that he does not drink alcohol.  Allergies:  Allergies  Allergen Reactions  . Codeine Anaphylaxis, Hives and Swelling  . Penicillins Anaphylaxis, Hives and Swelling  . Latex Itching  . Strawberry Extract Hives    Medications: Prior to Admission medications   Medication Sig Start Date End Date Taking? Authorizing Provider  aspirin EC 81 MG tablet Take 81 mg by mouth daily.   Yes Historical Provider, MD  benazepril (LOTENSIN) 40 MG tablet Take 40 mg by mouth daily.   Yes Historical Provider, MD  cyclobenzaprine (FLEXERIL) 10 MG tablet Take 1 tablet (10 mg total) by mouth 3 (three) times daily as needed for muscle spasms. 09/19/13  Yes Adeline Saralyn Pilar, MD  diazepam (VALIUM) 10 MG tablet Take 0.5 tablets (5 mg total) by mouth every 6 (six) hours as needed for anxiety (back spasms). Patient taking differently: Take 10 mg by mouth 4 (four) times daily as needed for anxiety (back spasms).  09/19/13  Yes Adeline Saralyn Pilar, MD  doxycycline (VIBRA-TABS) 100 MG tablet Take 1 tablet (100 mg total) by mouth 2 (two) times daily. 11/27/15  Yes Truman Hayward, MD  fluticasone [facility]) 50 MCG/ACT nasal spray Place 1 spray into the nose daily as needed for allergies.  07/04/14  Yes Historical Provider, MD    NEOMYCIN-POLYMYXIN-HYDROCORTISONE (CORTISPORIN) 1 % SOLN otic solution Place 2 drops in ear(s) daily. Reported on 11/12/2015 07/04/14  Yes Historical Provider, MD  pregabalin (LYRICA) 75 MG capsule Take 75 mg by mouth 2 (two) times daily.  05/07/13  Yes Historical Provider, MD  tamsulosin (FLOMAX) 0.4 MG CAPS capsule Take 0.4 mg by mouth.   Yes Historical Provider, MD    Scheduled Meds: . aspirin EC  81 mg Oral Daily  . benazepril  20 mg Oral Daily  . carvedilol  3.125 mg Oral BID WC  . cyclobenzaprine  10 mg Oral TID  . famotidine  20 mg Oral Daily  . heparin  5,000 Units Subcutaneous Q8H  . levofloxacin  500 mg Oral Daily  . polyvinyl alcohol  1 drop Both Eyes TID  . pregabalin  75 mg Oral BID  . senna-docusate  1 tablet Oral QHS  . tamsulosin  0.4 mg Oral QPC supper   Continuous Infusions: PRN Meds:.acetaminophen **OR** acetaminophen, diazepam, fluticasone, magnesium hydroxide, ondansetron **OR**  ondansetron [facility]) IV     Results for orders placed or performed during the hospital encounter of 06/03/16 (from the past 48 hour(s))  D-dimer, quantitative (not at [facility])     Status: Abnormal   Collection Time: 06/06/16 10:51 AM  Result Value Ref Range   D-Dimer, Quant 3.05 (H) 0.00 - 0.50 ug/mL-FEU    Comment: (NOTE) At the manufacturer cut-off of 0.50 ug/mL FEU, this assay has been documented to exclude PE with a sensitivity and negative predictive value of 97 to 99%.  At this time, this assay has not been approved by the FDA to exclude DVT/VTE. Results should be correlated with clinical presentation.   CBC     Status: Abnormal   Collection Time: 06/07/16  5:24 AM  Result Value Ref Range   WBC 8.8 4.0 - 10.5 K/uL   RBC 3.63 (L) 4.22 - 5.81 MIL/uL   Hemoglobin 11.7 (L) 13.0 - 17.0 g/dL   HCT 33.8 (L) 39.0 - 52.0 %   MCV 93.1 78.0 - 100.0 fL   MCH 32.2 26.0 - 34.0 pg   MCHC 34.6 30.0 - 36.0 g/dL   RDW 14.3 11.5 - 15.5 %   Platelets 402 (H) 150 - 400 K/uL  Comprehensive  metabolic panel     Status: Abnormal   Collection Time: 06/07/16  5:24 AM  Result Value Ref Range   Sodium 134 (L) 135 - 145 mmol/L   Potassium 3.9 3.5 - 5.1 mmol/L   Chloride 99 (L) 101 - 111 mmol/L   CO2 28 22 - 32 mmol/L   Glucose, Bld 115 (H) 65 - 99 mg/dL   BUN 14 6 - 20 mg/dL   Creatinine, Ser 0.83 0.61 - 1.24 mg/dL   Calcium 8.6 (L) 8.9 - 10.3 mg/dL   Total Protein 5.5 (L) 6.5 - 8.1 g/dL   Albumin 2.9 (L) 3.5 - 5.0 g/dL   AST 33 15 - 41 U/L   ALT 39 17 - 63 U/L   Alkaline Phosphatase 54 38 - 126 U/L   Total Bilirubin 0.6 0.3 - 1.2 mg/dL   GFR calc non Af Amer >60 >60 mL/min   GFR calc Af Amer >60 >60 mL/min    Comment: (NOTE) The eGFR has been calculated using the CKD EPI equation. This calculation has not been validated in all clinical situations. eGFR's persistently <60 mL/min signify possible Chronic Kidney Disease.    Anion gap 7 5 - 15  CBC     Status: Abnormal   Collection Time: 06/08/16  5:26 AM  Result Value Ref Range   WBC 10.1 4.0 - 10.5 K/uL   RBC 3.58 (L) 4.22 - 5.81 MIL/uL   Hemoglobin 11.7 (L) 13.0 - 17.0 g/dL   HCT 33.6 (L) 39.0 - 52.0 %   MCV 93.9 78.0 - 100.0 fL   MCH 32.7 26.0 - 34.0 pg   MCHC 34.8 30.0 - 36.0 g/dL   RDW 14.5 11.5 - 15.5 %   Platelets 405 (H) 150 - 400 K/uL  Comprehensive metabolic panel     Status: Abnormal   Collection Time: 06/08/16  5:26 AM  Result Value Ref Range   Sodium 136 135 - 145 mmol/L   Potassium 4.3 3.5 - 5.1 mmol/L   Chloride 102 101 - 111 mmol/L   CO2 27 22 - 32 mmol/L   Glucose, Bld 109 (H) 65 - 99 mg/dL   BUN 22 (H) 6 - 20 mg/dL   Creatinine, Ser 0.98 0.61 - 1.24 mg/dL  Calcium 8.7 (L) 8.9 - 10.3 mg/dL   Total Protein 5.4 (L) 6.5 - 8.1 g/dL   Albumin 2.8 (L) 3.5 - 5.0 g/dL   AST 42 (H) 15 - 41 U/L   ALT 41 17 - 63 U/L   Alkaline Phosphatase 59 38 - 126 U/L   Total Bilirubin 0.8 0.3 - 1.2 mg/dL   GFR calc non Af Amer >60 >60 mL/min   GFR calc Af Amer >60 >60 mL/min    Comment: (NOTE) The eGFR has  been calculated using the CKD EPI equation. This calculation has not been validated in all clinical situations. eGFR's persistently <60 mL/min signify possible Chronic Kidney Disease.    Anion gap 7 5 - 15    Studies/Results:  CTA LUNG IMPRESSION: Manubrial fracture  No evidence of pulmonary emboli.  Left lower lobe infiltrate and effusion.     BRAIN MRI FINDINGS: Brain: Cerebral volume is within normal limits for age. No restricted diffusion to suggest acute infarction. No midline shift, mass effect, evidence of mass lesion, ventriculomegaly, extra-axial collection or acute intracranial hemorrhage. Cervicomedullary junction and pituitary are within normal limits.  Pearline Cables and white matter signal is within normal limits for age throughout the brain. No cortical encephalomalacia or chronic cerebral blood products.  Vascular: Major intracranial vascular flow voids are preserved.  Skull and upper cervical spine: Negative. Normal bone marrow signal.  Sinuses/Orbits: Normal orbits soft tissues. Trace paranasal sinus mucosal thickening.  Other: Visible internal auditory structures appear normal. Mastoids are clear. Negative scalp soft tissues.  IMPRESSION: No acute intracranial abnormality. Normal for age noncontrast MRI appearance of the brain.            L SPINE MRI 2015 FINDINGS: Trace anterolisthesis of L3 on L4 and L4 on L5 is unchanged. There is grade 1 retrolisthesis of L1 on L2, stable to minimally increased from prior MRI. New from the prior MRI is a mild compression deformity involving the L2 superior endplate with approximately 10% vertebral body height loss. There is abnormal fluid signal within the L1-2 disc space, and there is also new edema throughout the L1 and L2 vertebral bodies. There is paraspinal inflammatory change/ phlegmon at this level, and there is a 1.9 x 1.3 cm fluid collection in the left psoas muscle at the L2 level  (series 5, image 16).  Advanced disc space height loss at L5-S1 is unchanged, however there is increased fluid signal within the disc space compared to the prior MRI with mildly increased marrow edema within the adjacent L5 and S1 vertebral bodies. The conus medullaris is normal in signal and terminates at the superior aspect of L2.  T11-12: Only imaged sagittally. Facet hypertrophy results in mild bilateral neural foraminal narrowing, right greater than left and unchanged.  T12-L1:  Negative.  L1-2: Sequelae of prior laminectomy and posterior fusion are again identified. Posterior retropulsion of the superior L2 endplate, greater on the right, with possibly small adjacent epidural fluid collection, results in new narrowing of the right lateral recess and increased right neural foraminal narrowing. No spinal canal stenosis.  L2-3: Mild disc bulge and facet hypertrophy result in mild right lateral recess and neural foraminal narrowing, unchanged.  L3-4: Mild disc bulge and moderate facet hypertrophy result in mild right neural foraminal narrowing, unchanged. No spinal canal stenosis.  L4-5: Left foraminal disc protrusion and mild-to-moderate facet hypertrophy result in mild to moderate left neural foraminal stenosis, unchanged. No spinal canal stenosis.  L5-S1: Disc bulge and mild facet hypertrophy result in  mild to moderate bilateral neural foraminal stenosis, unchanged. No spinal canal stenosis.  IMPRESSION: 1. Changes at L1-2 as above concerning for discitis/osteomyelitis with small left psoas abscess. New, mild compression deformity of the L2 superior endplate and mild retropulsion results in new right lateral recess narrowing and increased right neural foraminal narrowing at L1-2. Small ventral epidural fluid collection is not excluded on the right. 2. Advanced degenerative disc disease at L5-S1 with a small amount of new fluid signal in the disc space and  mildly increased adjacent marrow changes. Early discitis/osteomyelitis is not excluded.       Brittainy Bucker A. [name], M.D.  Diplomate, Tax adviser of Psychiatry and Neurology ( Neurology). 06/08/2016, 8:21 AM

## 2016-06-08 NOTE — Progress Notes (Signed)
Patient has had questionable hallucinations this shift and yesterday. Patient talks about thing "over there" when they are not and speaks about people who are not there. Patient thinks he has objects, like pills, in his hands when he does not.

## 2016-06-08 NOTE — Progress Notes (Signed)
Notified Nursing Supervisor to do safety assessment on pt. Pt attempting to get out of bed and had a documented fall this a.m. Pt impulsive and confused. Notified NP who ordered safety sitter for pt. Will continue to monitor pt.

## 2016-06-08 NOTE — Progress Notes (Signed)
At approximately 0405, patient found sitting on floor, leaning back on bed. Bed alarm was going off. Patient confused, A&O to person and date onlly. Patient denies pain. No injury noted. Patient assisted back to be x4 assist. BP 87/58, HR 100. Dr. Marin Comment notified. Steffanie Dunn, Agricultural consultant notified. Orders per Dr. Marin Comment.

## 2016-06-08 NOTE — Progress Notes (Signed)
Physical Therapy Note  Patient Details  Name: Marc Rose MRN: [id number] Date of Birth: Jul 27, 1950 Today's Date: 06/08/2016     Pt lying in bed very confused unable to communicate with therapist.  Spoke to nurse regarding mental state.  Therapy is not appropriate at this time.  Will continue to check for improvements and ability to follow commands.    Teena Irani, PTA/CLT [phone] 06/08/2016, 5:36 PM

## 2016-06-08 NOTE — Clinical Social Work Note (Signed)
Pt more confused. CSW spoke with neurologist who reports pt will require several more days in hospital. CSW updated insurance. Bed offer at [facility]. CSW attempted to reach pt's friend, Volney Presser again this morning.   Benay Pike, [place]

## 2016-06-09 ENCOUNTER — Inpatient Hospital Stay (HOSPITAL_COMMUNITY): Payer: PPO

## 2016-06-09 ENCOUNTER — Inpatient Hospital Stay (HOSPITAL_COMMUNITY): Payer: PPO | Admitting: Anesthesiology

## 2016-06-09 DIAGNOSIS — R2689 Other abnormalities of gait and mobility: Secondary | ICD-10-CM | POA: Diagnosis not present

## 2016-06-09 DIAGNOSIS — G629 Polyneuropathy, unspecified: Secondary | ICD-10-CM | POA: Diagnosis not present

## 2016-06-09 DIAGNOSIS — R4182 Altered mental status, unspecified: Secondary | ICD-10-CM | POA: Diagnosis not present

## 2016-06-09 DIAGNOSIS — N319 Neuromuscular dysfunction of bladder, unspecified: Secondary | ICD-10-CM

## 2016-06-09 LAB — BLOOD GAS, ARTERIAL
Acid-base deficit: 0.4 mmol/L (ref 0.0–2.0)
Bicarbonate: 24.3 mmol/L (ref 20.0–28.0)
DRAWN BY: 129711
FIO2: 60
MECHVT: 650 mL
O2 Saturation: 96.4 %
PEEP: 5 cmH2O
PH ART: 7.43 (ref 7.350–7.450)
Patient temperature: 37
RATE: 14 resp/min
pCO2 arterial: 35.9 mmHg (ref 32.0–48.0)
pO2, Arterial: 92.6 mmHg (ref 83.0–108.0)

## 2016-06-09 LAB — CBC
HEMATOCRIT: 30.7 % — AB (ref 39.0–52.0)
Hemoglobin: 10.7 g/dL — ABNORMAL LOW (ref 13.0–17.0)
MCH: 32.7 pg (ref 26.0–34.0)
MCHC: 34.9 g/dL (ref 30.0–36.0)
MCV: 93.9 fL (ref 78.0–100.0)
PLATELETS: 391 10*3/uL (ref 150–400)
RBC: 3.27 MIL/uL — AB (ref 4.22–5.81)
RDW: 14.6 % (ref 11.5–15.5)
WBC: 9.9 10*3/uL (ref 4.0–10.5)

## 2016-06-09 LAB — GLUCOSE, CAPILLARY: Glucose-Capillary: 94 mg/dL (ref 65–99)

## 2016-06-09 LAB — RPR: RPR Ser Ql: NONREACTIVE

## 2016-06-09 LAB — HOMOCYSTEINE: Homocysteine: 8.4 umol/L (ref 0.0–15.0)

## 2016-06-09 LAB — BASIC METABOLIC PANEL
ANION GAP: 9 (ref 5–15)
BUN: 41 mg/dL — ABNORMAL HIGH (ref 6–20)
CO2: 24 mmol/L (ref 22–32)
Calcium: 8.3 mg/dL — ABNORMAL LOW (ref 8.9–10.3)
Chloride: 108 mmol/L (ref 101–111)
Creatinine, Ser: 1.51 mg/dL — ABNORMAL HIGH (ref 0.61–1.24)
GFR calc Af Amer: 54 mL/min — ABNORMAL LOW (ref >60)
GFR, EST NON AFRICAN AMERICAN: 47 mL/min — AB (ref >60)
GLUCOSE: 97 mg/dL (ref 65–99)
POTASSIUM: 4.6 mmol/L (ref 3.5–5.1)
Sodium: 141 mmol/L (ref 135–145)

## 2016-06-09 LAB — LACTIC ACID, PLASMA: Lactic Acid, Venous: 2.3 mmol/L (ref 0.5–1.9)

## 2016-06-09 LAB — ANTINUCLEAR ANTIBODIES, IFA: ANA Ab, IFA: NEGATIVE

## 2016-06-09 LAB — HIV ANTIBODY (ROUTINE TESTING W REFLEX): HIV SCREEN 4TH GENERATION: NONREACTIVE

## 2016-06-09 MED ORDER — FENTANYL CITRATE (PF) 100 MCG/2ML IJ SOLN: 50.0000 ug | INTRAMUSCULAR | Status: DC | PRN

## 2016-06-09 MED ORDER — FENTANYL CITRATE (PF) 100 MCG/2ML IJ SOLN
50.0000 ug | INTRAMUSCULAR | Status: DC | PRN
  Administered 2016-06-09 – 2016-06-10 (×2): 50 ug via INTRAVENOUS
  Filled 2016-06-09 (×2): qty 2

## 2016-06-09 MED ORDER — VANCOMYCIN HCL IN DEXTROSE 1-5 GM/200ML-% IV SOLN
1000.0000 mg | Freq: Two times a day (BID) | INTRAVENOUS | Status: DC
  Administered 2016-06-10 (×2): 1000 mg via INTRAVENOUS
  Filled 2016-06-09 (×2): qty 200

## 2016-06-09 MED ORDER — LORAZEPAM 2 MG/ML IJ SOLN
1.0000 mg | INTRAMUSCULAR | Status: DC | PRN
  Administered 2016-06-09 (×2): 1 mg via INTRAVENOUS
  Filled 2016-06-09 (×3): qty 1

## 2016-06-09 MED ORDER — SODIUM CHLORIDE 0.9 % IV SOLN
0.0000 ug/min | INTRAVENOUS | Status: DC
  Administered 2016-06-09: 60 ug/min via INTRAVENOUS
  Administered 2016-06-13: 25 ug/min via INTRAVENOUS
  Administered 2016-06-13: 20 ug/min via INTRAVENOUS
  Administered 2016-06-14: 35 ug/min via INTRAVENOUS
  Administered 2016-06-15: 20 ug/min via INTRAVENOUS
  Administered 2016-06-17: 160 ug/min via INTRAVENOUS
  Administered 2016-06-17: 100 ug/min via INTRAVENOUS
  Administered 2016-06-17: 50 ug/min via INTRAVENOUS
  Administered 2016-06-18: 80 ug/min via INTRAVENOUS
  Filled 2016-06-09 (×6): qty 4

## 2016-06-09 MED ORDER — SODIUM CHLORIDE 0.9 % IV BOLUS (SEPSIS)
1000.0000 mL | Freq: Once | INTRAVENOUS | Status: AC
  Administered 2016-06-09: 1000 mL via INTRAVENOUS

## 2016-06-09 MED ORDER — DEXMEDETOMIDINE HCL IN NACL 200 MCG/50ML IV SOLN
0.4000 ug/kg/h | INTRAVENOUS | Status: DC
  Administered 2016-06-09: 0.2 ug/kg/h via INTRAVENOUS
  Administered 2016-06-09: 0.4 ug/kg/h via INTRAVENOUS
  Administered 2016-06-09 – 2016-06-10 (×3): 1.2 ug/kg/h via INTRAVENOUS
  Filled 2016-06-09: qty 50
  Filled 2016-06-09: qty 100
  Filled 2016-06-09 (×5): qty 50

## 2016-06-09 MED ORDER — PHENYLEPHRINE HCL 10 MG/ML IJ SOLN
INTRAMUSCULAR | Status: AC
  Filled 2016-06-09: qty 4

## 2016-06-09 MED ORDER — PANTOPRAZOLE SODIUM 40 MG IV SOLR
40.0000 mg | Freq: Every day | INTRAVENOUS | Status: DC
  Administered 2016-06-09 – 2016-06-19 (×11): 40 mg via INTRAVENOUS
  Filled 2016-06-09 (×11): qty 40

## 2016-06-09 MED ORDER — MIDAZOLAM HCL 2 MG/2ML IJ SOLN: 1.0000 mg | INTRAMUSCULAR | Status: DC | PRN

## 2016-06-09 MED ORDER — HALOPERIDOL LACTATE 5 MG/ML IJ SOLN
5.0000 mg | Freq: Once | INTRAMUSCULAR | Status: AC
  Administered 2016-06-09: 5 mg via INTRAMUSCULAR
  Filled 2016-06-09: qty 1

## 2016-06-09 MED ORDER — SODIUM CHLORIDE 0.9 % IV SOLN
INTRAVENOUS | Status: DC
  Administered 2016-06-09 – 2016-06-11 (×7): via INTRAVENOUS

## 2016-06-09 MED ORDER — ORAL CARE MOUTH RINSE
15.0000 mL | Freq: Four times a day (QID) | OROMUCOSAL | Status: DC
  Administered 2016-06-09 – 2016-06-19 (×40): 15 mL via OROMUCOSAL

## 2016-06-09 MED ORDER — PHENYLEPHRINE HCL 10 MG/ML IJ SOLN
INTRAMUSCULAR | Status: AC
  Filled 2016-06-09: qty 1

## 2016-06-09 MED ORDER — SODIUM CHLORIDE 0.9 % IV SOLN
0.0000 ug/min | INTRAVENOUS | Status: DC
  Administered 2016-06-09: 50 ug/min via INTRAVENOUS
  Filled 2016-06-09: qty 1

## 2016-06-09 MED ORDER — VANCOMYCIN HCL 10 G IV SOLR
INTRAVENOUS | Status: AC
  Filled 2016-06-09: qty 1500

## 2016-06-09 MED ORDER — CHLORHEXIDINE GLUCONATE 0.12% ORAL RINSE (MEDLINE KIT)
15.0000 mL | Freq: Two times a day (BID) | OROMUCOSAL | Status: DC
  Administered 2016-06-09 – 2016-06-25 (×29): 15 mL via OROMUCOSAL

## 2016-06-09 MED ORDER — VANCOMYCIN HCL 10 G IV SOLR
1500.0000 mg | Freq: Once | INTRAVENOUS | Status: AC
  Administered 2016-06-09: 1500 mg via INTRAVENOUS
  Filled 2016-06-09: qty 1500

## 2016-06-09 NOTE — Plan of Care (Signed)
Problem: Education: Goal: Knowledge of [facility] Education information/materials will improve Outcome: Not Progressing Pt confused and unable to follow commands.  Problem: Safety: Goal: Ability to remain free from injury will improve Outcome: Not Progressing Pt is spastic and continues to hit his arms and legs against rails.  Problem: Pain Managment: Goal: General experience of comfort will improve Outcome: Not Progressing Pt still is spastic and appears to be uncomfortable. Repositioned several times throughout shift.

## 2016-06-09 NOTE — Anesthesia Procedure Notes (Signed)
Procedure Name: Intubation Date/Time: 06/09/2016 5:35 PM Performed by: Vista Deck Pre-anesthesia Checklist: Patient identified, Emergency Drugs available, Suction available, Patient being monitored and Timeout performed Patient Re-evaluated:Patient Re-evaluated prior to inductionOxygen Delivery Method: Ambu bag Preoxygenation: Pre-oxygenation with 100% oxygen Ventilation: Mask ventilation without difficulty Laryngoscope Size: Glidescope and 3 (Lo pro) Grade View: Grade II Tube type: Subglottic suction tube Tube size: 7.0 mm Number of attempts: 1 Airway Equipment and Method: Video-laryngoscopy Placement Confirmation: ETT inserted through vocal cords under direct vision,  CO2 detector and breath sounds checked- equal and bilateral Secured at: 25 cm Tube secured with: tube securement by respiratory. Dental Injury: Teeth and Oropharynx as per pre-operative assessment
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
## 2016-06-09 NOTE — Progress Notes (Signed)
admitte for below Cam care stable On vent  Labs shpwing recuring lactic acidosis and risking creat - AKI  .plan 1L fluid bolus  Dr. Brand Males, M.D., [facility].C.P Pulmonary and Critical Care Medicine Staff Physician [facility] Pager: [phone], If no answer or between  15:00h - 7:00h: call [phone]  06/09/2016 10:27 PM         ICD-9-CM ICD-10-CM   1. Urinary tract infection without hematuria, site unspecified 599.0 N39.0   2. Fall E888.9 W19.Merril Abbe DG Chest 1 View     DG Chest 1 View  3. Weakness 780.79 R53.1   4. Abrasion, multiple sites 919.0 T07.XXXA   5. Left foot pain 729.5 M79.672   6. Urinary retention 788.20 R33.9   7. 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 Acute urinary retention 788.29 R33.8 US Renal     US Renal  10. Ataxic gait 781.2 R26.0   11. Muscle weakness (generalized) 728.87 M62.81   12. Weakness of both upper extremities 729.89 R29.898 MR BRAIN WO CONTRAST     MR BRAIN WO CONTRAST  13. Tachycardia 785.0 R00.0 CT ANGIO CHEST PE W OR WO CONTRAST     CT ANGIO CHEST PE W OR WO CONTRAST  14. Gait abnormality 781.2 R26.9 DG FLUORO GUIDED NEEDLE PLC ASPIRATION/INJECTION LOC     DG FLUORO GUIDED NEEDLE PLC ASPIRATION/INJECTION LOC  15. Respiratory failure (HCC) 518.81 J96.90 Portable Chest xray     Portable Chest xray  16. UTI (urinary tract infection) due to Enterococcus 599.0 N39.0    041.04 B95.2      PULMONARY  Recent Labs Lab 06/09/16 1810  PHART 7.430  PCO2ART 35.9  PO2ART 92.6  HCO3 24.3  O2SAT 96.4    CBC  Recent Labs Lab 06/07/16 0524 06/08/16 0526 06/09/16 1920  HGB 11.7* 11.7* 10.7*  HCT 33.8* 33.6* 30.7*  WBC 8.8 10.1 9.9  PLT 402* 405* 391    COAGULATION No results for input(s): INR in the last 168 hours.  CARDIAC  No results for input(s):  TROPONINI in the last 168 hours. No results for input(s): PROBNP in the last 168 hours.   CHEMISTRY  Recent Labs Lab 06/05/16 0731 06/06/16 0722 06/07/16 0524 06/08/16 0526 06/09/16 1920  NA 134* 134* 134* 136 141  K 3.9 3.9 3.9 4.3 4.6  CL 101 99* 99* 102 108  CO2 27 30 28 27 24   GLUCOSE 118* 114* 115* 109* 97  BUN 15 11 14  22* 41*  CREATININE 0.73 0.76 0.83 0.98 1.51*  CALCIUM 8.3* 8.7* 8.6* 8.7* 8.3*   Estimated Creatinine Clearance: 53.1 mL/min (by C-G formula based on SCr of 1.51 mg/dL (H)).   LIVER  Recent Labs Lab 06/04/16 0531 06/07/16 0524 06/08/16 0526  AST 82* 33 42*  ALT 50 39 41  ALKPHOS 54 54 59  BILITOT 0.9 0.6 0.8  PROT 5.5* 5.5* 5.4*  ALBUMIN 3.0* 2.9* 2.8*     INFECTIOUS  Recent Labs Lab 06/03/16 1136 06/03/16 1444 06/09/16 1920  LATICACIDVEN 2.28* 1.18 2.3*     ENDOCRINE CBG (last 3)   Recent Labs  06/09/16 1816  GLUCAP 94         IMAGING x48h  - image(s) personally visualized  -   highlighted in bold Portable Chest Xray  Result Date: 06/09/2016 CLINICAL DATA:  Respiratory failure. EXAM: PORTABLE CHEST 1 VIEW COMPARISON:  Radiographs of June 04, 2016. FINDINGS: The heart size and mediastinal contours are within normal limits. Both lungs are clear. No pneumothorax or pleural effusion is noted. Endotracheal tube is seen projected over tracheal air shadow with distal tip 4 cm above the carina. Nasogastric tube tip is seen in proximal stomach. The visualized skeletal structures are unremarkable. IMPRESSION: Endotracheal tube in grossly good position. Nasogastric tube tip seen in proximal stomach. No acute cardiopulmonary abnormality seen. Electronically Signed   By: Marijo Conception, M.D.   On: 06/09/2016 18:07

## 2016-06-09 NOTE — Progress Notes (Signed)
Pharmacy Antibiotic Note  Marc Rose is a 66 y.o. male admitted on 06/03/2016 with sepsis.  Pharmacy has been consulted for VANCOMYCIN dosing.  Plan: Vancomycin 1500mg  x 1 then 1000mg  IV q12hrs Check trough at steady state Monitor labs, progress and c/s  Height: 6\' 2"  (188 cm) Weight: 172 lb (78 kg) IBW/kg (Calculated) : 82.2  Temp (24hrs), Avg:98.5 F (36.9 C), Min:97.7 F (36.5 C), Max:99.9 F (37.7 C)   Recent Labs Lab 06/03/16 1136 06/03/16 1444 06/04/16 0531 06/05/16 0731 06/06/16 0722 06/07/16 0524 06/08/16 0526  WBC  --   --  12.5* 8.2 7.8 8.8 10.1  CREATININE  --   --  0.89 0.73 0.76 0.83 0.98  LATICACIDVEN 2.28* 1.18  --   --   --   --   --     Estimated Creatinine Clearance: 82.9 mL/min (by C-G formula based on SCr of 0.98 mg/dL).    Allergies  Allergen Reactions  . Codeine Anaphylaxis, Hives and Swelling  . Penicillins Anaphylaxis, Hives and Swelling  . Latex Itching  . Strawberry Extract Hives   Antimicrobials this admission: Vancomycin 2/7 >>  Levaquin 2/1 >> 2/7  Dose adjustments this admission:  Microbiology results:  BCx: pending  UCx: pending   Sputum:    MRSA PCR:   Thank you for allowing pharmacy to be a part of this patient's care.  Hart Robinsons A 06/09/2016 6:50 PM

## 2016-06-09 NOTE — Progress Notes (Signed)
The patient is currently unable to consent for his spinal tap because of marked worsening in confusion. A spinal tap is required because of the progressive confusion and to evaluate for the patient's leg weakness despite the fact that he cannot consent.

## 2016-06-09 NOTE — Consult Note (Signed)
[place] A. [name], MD     [email or web address]          Marc Rose is an 66 y.o. male.   ASSESSMENT/PLAN:     UPDATE 06-09-16 It appears that the patient has been quite confused and agitated since last seen yesterday. He is quite confused and apparently not able to sign for spinal tap at this time.       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 The patient's symptoms are very severe and therefore I think I will recommend treatment with IV immunoglobulins. How therefore recommend standard treatment of 400 mg/kg for 5 days. The patient should have physical and occupational therapy. Spinal tap will also be arranged to have with the workup. Extensive labs also be done to rule out for other potential mimickers such as vasculitis. The patient seemed to be on the nurse suggestive of possible thiamine deficiency. Thiamine replacement will also be started.      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 The patient apparently also has a UTI which make explain some of his symptoms including the encephalopathy.  Low back pain status post L1-L2 laminectomy.    GENERAL: This is a pleasant man who is somewhat restless. He is disheveled appearing and appears to be on the nurse.  HEENT: Supple. Atraumatic normocephalic. Dense cataracts both sides.  ABDOMEN: soft  EXTREMITIES: No edema. Multiple abrasions and lacerations involving the knees and legs.   BACK: Normal.  SKIN: Normal by inspection.    MENTAL STATUS: He is awake but very restless and moving around. He is pulling out things and moving around continuously. Speech is nonsensical and incomprehensible.  CRANIAL NERVES: Pupils are equal, round  and reactive to light; extra ocular movements shows limited upgaze, there is full movements to the left but impaired movements to the right involving both eyes with a left going about 80% over and the right not crossing midline even passively; there is no significant nystagmus; visual fields are full; upper and lower facial muscles are normal in strength and symmetric, there is no flattening of the nasolabial folds; tongue is midline; uvula is midline; shoulder elevation is normal.  MOTOR: Normal tone, bulk and strength; no pronator drift.  COORDINATION: The patient has severe truncal and appendicular ataxia. He is not able to sit up first maintain his posture. He again has severe appendicular ataxia involving the upper and lower extremities.  REFLEXES: The patient is areflexic in the legs and upper extremities.  SENSATION: Normal to pain.    Blood pressure 97/60, pulse (!) 126, temperature 97.7 F (36.5 C), temperature source Axillary, resp. rate (!) 22, height '6\' 2"'  (1.88 m), weight 172 lb (78 kg), SpO2 94 %.  Past Medical History:  Diagnosis Date  . Anxiety state, unspecified   . Arthritis   . 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 Benign neoplasm of colon   . Causalgia of upper limb    Left limb  . Chronic back pain   . Chronic leg pain   . Depression   . Diabetes mellitus   . Diskitis 02/05/2015  . Drug rash 11/12/2015  . Elevated blood pressure reading without diagnosis of hypertension   . Essential hypertension, benign   . Hardware complicating wound infection ([place]) 02/05/2015  . Hyperpigmentation  05/15/2015  . Hypertension   . Impaired fasting glucose   . Peripheral arterial disease ([place])   . Pure hypercholesterolemia   . Tobacco use disorder   . Unspecified retinal detachment     Past Surgical History:  Procedure Laterality Date  . ANKLE SURGERY     left  . APPENDECTOMY    . BACK SURGERY    . COLONOSCOPY    . KNEE SURGERY     left    Family History  Problem Relation Age of Onset   . COPD Mother   . Hyperthyroidism Mother   . Hyperthyroidism Sister     Social History:  reports that he has been smoking Cigarettes.  He has a 19.50 pack-year smoking history. He has never used smokeless tobacco. He reports that he uses drugs, including Marijuana. He reports that he does not drink alcohol.  Allergies:  Allergies  Allergen Reactions  . Codeine Anaphylaxis, Hives and Swelling  . Penicillins Anaphylaxis, Hives and Swelling  . Latex Itching  . Strawberry Extract Hives    Medications: Prior to Admission medications   Medication Sig Start Date End Date Taking? Authorizing Provider  aspirin EC 81 MG tablet Take 81 mg by mouth daily.   Yes Historical Provider, MD  benazepril (LOTENSIN) 40 MG tablet Take 40 mg by mouth daily.   Yes Historical Provider, MD  cyclobenzaprine (FLEXERIL) 10 MG tablet Take 1 tablet (10 mg total) by mouth 3 (three) times daily as needed for muscle spasms. 09/19/13  Yes Adeline Saralyn Pilar, MD  diazepam (VALIUM) 10 MG tablet Take 0.5 tablets (5 mg total) by mouth every 6 (six) hours as needed for anxiety (back spasms). Patient taking differently: Take 10 mg by mouth 4 (four) times daily as needed for anxiety (back spasms).  09/19/13  Yes Adeline Saralyn Pilar, MD  doxycycline (VIBRA-TABS) 100 MG tablet Take 1 tablet (100 mg total) by mouth 2 (two) times daily. 11/27/15  Yes Truman Hayward, MD  fluticasone [facility]) 50 MCG/ACT nasal spray Place 1 spray into the nose daily as needed for allergies.  07/04/14  Yes Historical Provider, MD  NEOMYCIN-POLYMYXIN-HYDROCORTISONE (CORTISPORIN) 1 % SOLN otic solution Place 2 drops in ear(s) daily. Reported on 11/12/2015 07/04/14  Yes Historical Provider, MD  pregabalin (LYRICA) 75 MG capsule Take 75 mg by mouth 2 (two) times daily.  05/07/13  Yes Historical Provider, MD  tamsulosin (FLOMAX) 0.4 MG CAPS capsule Take 0.4 mg by mouth.   Yes Historical Provider, MD    Scheduled Meds: . benazepril  20 mg Oral Daily  .  carvedilol  3.125 mg Oral BID WC  . cyclobenzaprine  10 mg Oral TID  . famotidine  20 mg Oral Daily  . IMMUNE GLOBULIN 10% (HUMAN) IV - For Fluid Restriction Only  400 mg/kg Intravenous Q24H  . levofloxacin  500 mg Oral Daily  . polyvinyl alcohol  1 drop Both Eyes TID  . pregabalin  75 mg Oral BID  . senna-docusate  1 tablet Oral QHS  . tamsulosin  0.4 mg Oral QPC supper  . thiamine injection  100 mg Intravenous Daily   Continuous Infusions: PRN Meds:.acetaminophen **OR** acetaminophen, diazepam, fluticasone, LORazepam, magnesium hydroxide, ondansetron **OR** ondansetron (ZOFRAN) IV     Results for orders placed or performed during the hospital encounter of 06/03/16 (from the past 48 hour(s))  CBC     Status: Abnormal   Collection Time: 06/08/16  5:26 AM  Result Value Ref Range   WBC 10.1 4.0 -  10.5 K/uL   RBC 3.58 (L) 4.22 - 5.81 MIL/uL   Hemoglobin 11.7 (L) 13.0 - 17.0 g/dL   HCT 33.6 (L) 39.0 - 52.0 %   MCV 93.9 78.0 - 100.0 fL   MCH 32.7 26.0 - 34.0 pg   MCHC 34.8 30.0 - 36.0 g/dL   RDW 14.5 11.5 - 15.5 %   Platelets 405 (H) 150 - 400 K/uL  Comprehensive metabolic panel     Status: Abnormal   Collection Time: 06/08/16  5:26 AM  Result Value Ref Range   Sodium 136 135 - 145 mmol/L   Potassium 4.3 3.5 - 5.1 mmol/L   Chloride 102 101 - 111 mmol/L   CO2 27 22 - 32 mmol/L   Glucose, Bld 109 (H) 65 - 99 mg/dL   BUN 22 (H) 6 - 20 mg/dL   Creatinine, Ser 0.98 0.61 - 1.24 mg/dL   Calcium 8.7 (L) 8.9 - 10.3 mg/dL   Total Protein 5.4 (L) 6.5 - 8.1 g/dL   Albumin 2.8 (L) 3.5 - 5.0 g/dL   AST 42 (H) 15 - 41 U/L   ALT 41 17 - 63 U/L   Alkaline Phosphatase 59 38 - 126 U/L   Total Bilirubin 0.8 0.3 - 1.2 mg/dL   GFR calc non Af Amer >60 >60 mL/min   GFR calc Af Amer >60 >60 mL/min    Comment: (NOTE) The eGFR has been calculated using the CKD EPI equation. This calculation has not been validated in all clinical situations. eGFR's persistently <60 mL/min signify possible Chronic  Kidney Disease.    Anion gap 7 5 - 15  RPR     Status: None   Collection Time: 06/08/16  9:41 AM  Result Value Ref Range   RPR Ser Ql Non Reactive Non Reactive    Comment: (NOTE) Performed At: [facility] [address] [place], [place] [id number] Lindon Romp MD [id number]   ANA, IFA (with reflex)     Status: None   Collection Time: 06/08/16  9:41 AM  Result Value Ref Range   ANA Ab, IFA Negative     Comment: (NOTE)                                     Negative   <1:80                                     Borderline  1:80                                     Positive   >1:80 Performed At: [facility] [place], [place] [id number] Lindon Romp MD [id number]   Sedimentation rate     Status: Abnormal   Collection Time: 06/08/16  9:41 AM  Result Value Ref Range   Sed Rate 33 (H) 0 - 16 mm/hr  HIV antibody (routine testing) (NOT for [facility])     Status: None   Collection Time: 06/08/16  9:41 AM  Result Value Ref Range   HIV Screen 4th Generation wRfx Non Reactive Non Reactive    Comment: (NOTE) Performed At: [facility] [address] [place], [place] [id number] Lindon Romp MD [id number]   Vitamin B12  Status: None   Collection Time: 06/08/16  9:42 AM  Result Value Ref Range   Vitamin B-12 605 180 - 914 pg/mL    Comment: (NOTE) This assay is not validated for testing neonatal or myeloproliferative syndrome specimens for Vitamin B12 levels. Performed at [facility], [place] [address] [place], [place] [address]   TSH     Status: Abnormal   Collection Time: 06/08/16  9:42 AM  Result Value Ref Range   TSH 5.323 (H) 0.350 - 4.500 uIU/mL    Comment: Performed by a 3rd Generation assay with a functional sensitivity of <=0.01 uIU/mL.  C-reactive protein     Status: Abnormal   Collection Time: 06/08/16  9:42 AM  Result Value Ref Range   CRP 8.7 (H) <1.0 mg/dL    Comment: Performed at [facility] [address], [place],  [address]    Studies/Results:  CTA LUNG IMPRESSION: Manubrial fracture  No evidence of pulmonary emboli.  Left lower lobe infiltrate and effusion.     BRAIN MRI FINDINGS: Brain: Cerebral volume is within normal limits for age. No restricted diffusion to suggest acute infarction. No midline shift, mass effect, evidence of mass lesion, ventriculomegaly, extra-axial collection or acute intracranial hemorrhage. Cervicomedullary junction and pituitary are within normal limits.  Pearline Cables and white matter signal is within normal limits for age throughout the brain. No cortical encephalomalacia or chronic cerebral blood products.  Vascular: Major intracranial vascular flow voids are preserved.  Skull and upper cervical spine: Negative. Normal bone marrow signal.  Sinuses/Orbits: Normal orbits soft tissues. Trace paranasal sinus mucosal thickening.  Other: Visible internal auditory structures appear normal. Mastoids are clear. Negative scalp soft tissues.  IMPRESSION: No acute intracranial abnormality. Normal for age noncontrast MRI appearance of the brain.            L SPINE MRI 2015 FINDINGS: Trace anterolisthesis of L3 on L4 and L4 on L5 is unchanged. There is grade 1 retrolisthesis of L1 on L2, stable to minimally increased from prior MRI. New from the prior MRI is a mild compression deformity involving the L2 superior endplate with approximately 10% vertebral body height loss. There is abnormal fluid signal within the L1-2 disc space, and there is also new edema throughout the L1 and L2 vertebral bodies. There is paraspinal inflammatory change/ phlegmon at this level, and there is a 1.9 x 1.3 cm fluid collection in the left psoas muscle at the L2 level (series 5, image 16).  Advanced disc space height loss at L5-S1 is unchanged, however there is increased fluid signal within the disc space compared to the prior MRI with  mildly increased marrow edema within the adjacent L5 and S1 vertebral bodies. The conus medullaris is normal in signal and terminates at the superior aspect of L2.  T11-12: Only imaged sagittally. Facet hypertrophy results in mild bilateral neural foraminal narrowing, right greater than left and unchanged.  T12-L1:  Negative.  L1-2: Sequelae of prior laminectomy and posterior fusion are again identified. Posterior retropulsion of the superior L2 endplate, greater on the right, with possibly small adjacent epidural fluid collection, results in new narrowing of the right lateral recess and increased right neural foraminal narrowing. No spinal canal stenosis.  L2-3: Mild disc bulge and facet hypertrophy result in mild right lateral recess and neural foraminal narrowing, unchanged.  L3-4: Mild disc bulge and moderate facet hypertrophy result in mild right neural foraminal narrowing, unchanged. No spinal canal stenosis.  L4-5: Left foraminal disc  protrusion and mild-to-moderate facet hypertrophy result in mild to moderate left neural foraminal stenosis, unchanged. No spinal canal stenosis.  L5-S1: Disc bulge and mild facet hypertrophy result in mild to moderate bilateral neural foraminal stenosis, unchanged. No spinal canal stenosis.  IMPRESSION: 1. Changes at L1-2 as above concerning for discitis/osteomyelitis with small left psoas abscess. New, mild compression deformity of the L2 superior endplate and mild retropulsion results in new right lateral recess narrowing and increased right neural foraminal narrowing at L1-2. Small ventral epidural fluid collection is not excluded on the right. 2. Advanced degenerative disc disease at L5-S1 with a small amount of new fluid signal in the disc space and mildly increased adjacent marrow changes. Early discitis/osteomyelitis is not excluded.       Kierrah Kilbride A. [name], M.D.  Diplomate, Tax adviser of Psychiatry and  Neurology ( Neurology). 06/09/2016, 1:51 PM

## 2016-06-09 NOTE — Progress Notes (Signed)
RECEIVED FROM 325. PT THRASHING AND JERKIING. PRECEDEX DRIP STARTED.

## 2016-06-09 NOTE — Progress Notes (Signed)
RT note-rounding on patients this evening, new patient from 300 unit. Nursing staff with patient, noted difficulty with airway protection. Precedex just started upon arrival to ICU. Patient was not responsive and not maintaining a his airway. Ambu obtained and anesthesia called for intubation. #8.0 secured at 25 at the lip. Xray pending, BBS equal. B/P 45/34 at this time.

## 2016-06-09 NOTE — Progress Notes (Signed)
PT HAVING DIFFICULTY KEEPING AIR WAY OPEN. PT BAGGED AND ANESTESIA CALLED TO INTUBATE.

## 2016-06-09 NOTE — Progress Notes (Signed)
PT INTUBATED W/ #8 ET TUBE. 25 ATL. #12 OG INSERTED.Marland Kitchen PRECEDEX OFF AT PRESENT

## 2016-06-10 ENCOUNTER — Inpatient Hospital Stay (HOSPITAL_COMMUNITY): Payer: PPO

## 2016-06-10 DIAGNOSIS — G629 Polyneuropathy, unspecified: Secondary | ICD-10-CM | POA: Diagnosis not present

## 2016-06-10 DIAGNOSIS — R4182 Altered mental status, unspecified: Secondary | ICD-10-CM | POA: Diagnosis not present

## 2016-06-10 DIAGNOSIS — G610 Guillain-Barre syndrome: Secondary | ICD-10-CM | POA: Diagnosis present

## 2016-06-10 DIAGNOSIS — E440 Moderate protein-calorie malnutrition: Secondary | ICD-10-CM | POA: Insufficient documentation

## 2016-06-10 DIAGNOSIS — J9600 Acute respiratory failure, unspecified whether with hypoxia or hypercapnia: Secondary | ICD-10-CM | POA: Diagnosis not present

## 2016-06-10 DIAGNOSIS — J9601 Acute respiratory failure with hypoxia: Secondary | ICD-10-CM

## 2016-06-10 DIAGNOSIS — R2689 Other abnormalities of gait and mobility: Secondary | ICD-10-CM | POA: Diagnosis not present

## 2016-06-10 DIAGNOSIS — G039 Meningitis, unspecified: Secondary | ICD-10-CM | POA: Diagnosis not present

## 2016-06-10 DIAGNOSIS — R571 Hypovolemic shock: Secondary | ICD-10-CM | POA: Diagnosis not present

## 2016-06-10 LAB — CSF CELL COUNT WITH DIFFERENTIAL
RBC Count, CSF: 1 /mm3 — ABNORMAL HIGH
TUBE #: 4
WBC, CSF: 9 /mm3 — ABNORMAL HIGH (ref 0–5)

## 2016-06-10 LAB — BASIC METABOLIC PANEL
Anion gap: 6 (ref 5–15)
BUN: 41 mg/dL — AB (ref 6–20)
CHLORIDE: 112 mmol/L — AB (ref 101–111)
CO2: 22 mmol/L (ref 22–32)
Calcium: 7.9 mg/dL — ABNORMAL LOW (ref 8.9–10.3)
Creatinine, Ser: 1.15 mg/dL (ref 0.61–1.24)
GFR calc Af Amer: >60 mL/min (ref >60)
Glucose, Bld: 134 mg/dL — ABNORMAL HIGH (ref 65–99)
Potassium: 4.1 mmol/L (ref 3.5–5.1)
SODIUM: 140 mmol/L (ref 135–145)

## 2016-06-10 LAB — BLOOD GAS, ARTERIAL
ACID-BASE DEFICIT: 3.4 mmol/L — AB (ref 0.0–2.0)
Bicarbonate: 22.5 mmol/L (ref 20.0–28.0)
DRAWN BY: 12971
FIO2: 0.5
MECHVT: 650 mL
O2 Saturation: 99 %
PEEP/CPAP: 5 cmH2O
PH ART: 7.486 — AB (ref 7.350–7.450)
PO2 ART: 182 mmHg — AB (ref 83.0–108.0)
Patient temperature: 98.6
RATE: 16 resp/min
pCO2 arterial: 26.2 mmHg — ABNORMAL LOW (ref 32.0–48.0)

## 2016-06-10 LAB — GLUCOSE, CAPILLARY
GLUCOSE-CAPILLARY: 109 mg/dL — AB (ref 65–99)
GLUCOSE-CAPILLARY: 108 mg/dL — AB (ref 65–99)
Glucose-Capillary: 71 mg/dL (ref 65–99)

## 2016-06-10 LAB — MRSA PCR SCREENING: MRSA by PCR: NEGATIVE

## 2016-06-10 LAB — CBC
HCT: 30.2 % — ABNORMAL LOW (ref 39.0–52.0)
Hemoglobin: 10.5 g/dL — ABNORMAL LOW (ref 13.0–17.0)
MCH: 32.5 pg (ref 26.0–34.0)
MCHC: 34.8 g/dL (ref 30.0–36.0)
MCV: 93.5 fL (ref 78.0–100.0)
PLATELETS: 355 10*3/uL (ref 150–400)
RBC: 3.23 MIL/uL — ABNORMAL LOW (ref 4.22–5.81)
RDW: 14.9 % (ref 11.5–15.5)
WBC: 7.7 10*3/uL (ref 4.0–10.5)

## 2016-06-10 LAB — PROTEIN AND GLUCOSE, CSF
GLUCOSE CSF: 62 mg/dL (ref 40–70)
TOTAL PROTEIN, CSF: 205 mg/dL — AB (ref 15–45)

## 2016-06-10 LAB — TRIGLYCERIDES: Triglycerides: 128 mg/dL (ref <150)

## 2016-06-10 LAB — LACTIC ACID, PLASMA: Lactic Acid, Venous: 1 mmol/L (ref 0.5–1.9)

## 2016-06-10 LAB — CRYPTOCOCCAL ANTIGEN, CSF: CRYPTO AG: NEGATIVE

## 2016-06-10 LAB — AMMONIA: Ammonia: 18 umol/L (ref 9–35)

## 2016-06-10 MED ORDER — MIDAZOLAM HCL 2 MG/2ML IJ SOLN
2.0000 mg | INTRAMUSCULAR | Status: DC | PRN
  Administered 2016-06-12: 2 mg via INTRAVENOUS

## 2016-06-10 MED ORDER — CHLORHEXIDINE GLUCONATE CLOTH 2 % EX PADS
6.0000 | MEDICATED_PAD | Freq: Every day | CUTANEOUS | Status: DC
  Administered 2016-06-10 – 2016-06-25 (×15): 6 via TOPICAL

## 2016-06-10 MED ORDER — FENTANYL CITRATE (PF) 100 MCG/2ML IJ SOLN
100.0000 ug | INTRAMUSCULAR | Status: DC | PRN
  Administered 2016-06-11 (×2): 100 ug via INTRAVENOUS
  Administered 2016-06-12: 25 ug via INTRAVENOUS
  Administered 2016-06-12 – 2016-06-16 (×24): 100 ug via INTRAVENOUS
  Filled 2016-06-10 (×28): qty 2

## 2016-06-10 MED ORDER — PREGABALIN 75 MG PO CAPS
75.0000 mg | ORAL_CAPSULE | Freq: Two times a day (BID) | ORAL | Status: DC
  Administered 2016-06-10 – 2016-06-11 (×3): 75 mg
  Filled 2016-06-10 (×3): qty 1

## 2016-06-10 MED ORDER — MIDAZOLAM HCL 2 MG/2ML IJ SOLN
2.0000 mg | INTRAMUSCULAR | Status: DC | PRN
  Administered 2016-06-12: 2 mg via INTRAVENOUS
  Filled 2016-06-10 (×2): qty 2

## 2016-06-10 MED ORDER — SODIUM CHLORIDE 0.9% FLUSH: 10.0000 mL | INTRAVENOUS | Status: DC | PRN

## 2016-06-10 MED ORDER — VITAL HIGH PROTEIN PO LIQD
1000.0000 mL | ORAL | Status: DC
  Administered 2016-06-10 – 2016-06-15 (×6): 1000 mL
  Filled 2016-06-10 (×9): qty 1000

## 2016-06-10 MED ORDER — PROPOFOL 1000 MG/100ML IV EMUL
0.0000 ug/kg/min | INTRAVENOUS | Status: DC
Start: 2016-06-10 — End: 2016-06-13
  Administered 2016-06-10: 5 ug/kg/min via INTRAVENOUS
  Administered 2016-06-10: 50 ug/kg/min via INTRAVENOUS
  Administered 2016-06-10: 40 ug/kg/min via INTRAVENOUS
  Administered 2016-06-11: 50 ug/kg/min via INTRAVENOUS
  Administered 2016-06-11: 40 ug/kg/min via INTRAVENOUS
  Administered 2016-06-11: 45 ug/kg/min via INTRAVENOUS
  Administered 2016-06-11: 40 ug/kg/min via INTRAVENOUS
  Administered 2016-06-11 (×2): 45 ug/kg/min via INTRAVENOUS
  Administered 2016-06-12: 25 ug/kg/min via INTRAVENOUS
  Administered 2016-06-12: 40 ug/kg/min via INTRAVENOUS
  Administered 2016-06-12: 45 ug/kg/min via INTRAVENOUS
  Administered 2016-06-12: 30 ug/kg/min via INTRAVENOUS
  Administered 2016-06-13: 40 ug/kg/min via INTRAVENOUS
  Administered 2016-06-13: 50 ug/kg/min via INTRAVENOUS
  Administered 2016-06-13: 60 ug/kg/min via INTRAVENOUS
  Administered 2016-06-13: 45 ug/kg/min via INTRAVENOUS
  Filled 2016-06-10 (×18): qty 100

## 2016-06-10 MED ORDER — SODIUM CHLORIDE 0.9% FLUSH
10.0000 mL | Freq: Two times a day (BID) | INTRAVENOUS | Status: DC
  Administered 2016-06-10 – 2016-06-25 (×29): 10 mL

## 2016-06-10 NOTE — Progress Notes (Signed)
PT Cancellation Note  Patient Details Name: ESTIL KRIZAN MRN: [id number] DOB: 09-25-1950   Cancelled Treatment:    Reason Eval/Treat Not Completed: Patient not medically ready (Pt was transferred to ICU due to respiratory distress and is currently intubated and sedated.  He is on 60% oxygen. No opportunity for weaning now.  PT will will sign off at this time.  Please re-order when pt is medically appropriate to participate in PT.  )   Beth Sitlaly Gudiel, PT, DPT X: [phone]

## 2016-06-10 NOTE — Procedures (Signed)
A spinal tap was carried out. No family is available but the test is needed because of worsening confusion. The study is done while the patient is heavily sedated. He is placed in the left lateral decubitus position with the knees flexed. The L3-L4 interspace is identified using typical landmarks. There is cleansed with Betadine. The skin and the needle track is anesthetized with lidocaine. A 22-gauge spinal needle is passed between the L3-L4 interspace. A single pass and the spinal fluid is accessed. The fluid is clear. Approximately 8 cc of fluid was collected and sent to the lab. The stylets is replaced and the needle removed.

## 2016-06-10 NOTE — Progress Notes (Signed)
RT note-ABG results called to Dr. Luan Pulling, wean fio2 and decrease ventilator rate to 14

## 2016-06-10 NOTE — Progress Notes (Signed)
Marc A. [name], MD     [email or web address]          Marc Rose is an 66 y.o. male.   ASSESSMENT/PLAN:     UPDATE 06-10-16 Severe delirium/encephalopathy of unclear etiology at this time. We are suspecting that this may be multifactorial including UTI and medication effect from Levaquin. Levaquin has been discontinued. The picture seems most consistent with toxic metabolic encephalopathy/delirium as opposed to primary and CNS problem. Spinal tap has been done and will follow the results of this.   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 Complete IV IG   The patient was transferred to the ICU and placed on Precedex. Unfortunately this would not help his severe flaring and severe delirium and confusion. He was subsequently intubated for airway protection. He had to be switched to propofol.                   Low back pain status post L1-L2 laminectomy.        GENERAL: He is intubated and quite sedated on propofol.   HEENT: Supple. Atraumatic normocephalic. Dense cataracts both sides.  ABDOMEN: soft  EXTREMITIES: No edema. Multiple abrasions and lacerations involving the knees and legs.   BACK: Normal.  SKIN: Normal by inspection.    MENTAL STATUS: He is awake but very restless and moving around. He is pulling out things and moving around continuously. Speech is nonsensical and incomprehensible.  CRANIAL NERVES: Coronary reflexes are absent but pupils are reactive. Oculocephalic reflexes limited but seems fine. He has no response to deep painful stimuli.   MOTOR: No response to deep painful stimuli but again he is heavily sedated on propofol.  COORDINATION: No tremor or dysmetria  REFLEXES: The patient is areflexic in the legs and upper extremities.      Blood pressure 106/65, pulse (!) 44, temperature 98.5 F (36.9 C), temperature  source Axillary, resp. rate 16, height '6\' 2"'  (1.88 m), weight 160 lb 0.9 oz (72.6 kg), SpO2 100 %.  Past Medical History:  Diagnosis Date  . Anxiety state, unspecified   . Arthritis   . 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 Benign neoplasm of colon   . Causalgia of upper limb    Left limb  . Chronic back pain   . Chronic leg pain   . Depression   . Diabetes mellitus   . Diskitis 02/05/2015  . Drug rash 11/12/2015  . Elevated blood pressure reading without diagnosis of hypertension   . Essential hypertension, benign   . Hardware complicating wound infection ([place]) 02/05/2015  . Hyperpigmentation 05/15/2015  . Hypertension   . Impaired fasting glucose   . Peripheral arterial disease ([place])   . Pure hypercholesterolemia   . Tobacco use disorder   . Unspecified retinal detachment     Past Surgical History:  Procedure Laterality Date  . ANKLE SURGERY     left  . APPENDECTOMY    . BACK SURGERY    . COLONOSCOPY    . KNEE SURGERY     left    Family History  Problem Relation Age of Onset  . COPD Mother   . Hyperthyroidism Mother   . Hyperthyroidism Sister     Social History:  reports that he has been smoking Cigarettes.  He has a 19.50 pack-year smoking history. He has never used smokeless tobacco. He reports that he uses drugs, including Marijuana. He reports that he does not drink alcohol.  Allergies:  Allergies  Allergen Reactions  . Codeine Anaphylaxis, Hives and Swelling  . Penicillins Anaphylaxis, Hives and Swelling  . Latex Itching  . Strawberry Extract Hives    Medications: Prior to Admission medications   Medication Sig Start Date End Date Taking? Authorizing Provider  aspirin EC 81 MG tablet Take 81 mg by mouth daily.   Yes Historical Provider, MD  benazepril (LOTENSIN) 40 MG tablet Take 40 mg by mouth daily.   Yes Historical Provider, MD  cyclobenzaprine (FLEXERIL) 10 MG tablet Take 1 tablet (10 mg total) by mouth 3 (three) times daily as needed for muscle spasms. 09/19/13  Yes  Adeline Saralyn Pilar, MD  diazepam (VALIUM) 10 MG tablet Take 0.5 tablets (5 mg total) by mouth every 6 (six) hours as needed for anxiety (back spasms). Patient taking differently: Take 10 mg by mouth 4 (four) times daily as needed for anxiety (back spasms).  09/19/13  Yes Adeline Saralyn Pilar, MD  doxycycline (VIBRA-TABS) 100 MG tablet Take 1 tablet (100 mg total) by mouth 2 (two) times daily. 11/27/15  Yes Truman Hayward, MD  fluticasone [facility]) 50 MCG/ACT nasal spray Place 1 spray into the nose daily as needed for allergies.  07/04/14  Yes Historical Provider, MD  NEOMYCIN-POLYMYXIN-HYDROCORTISONE (CORTISPORIN) 1 % SOLN otic solution Place 2 drops in ear(s) daily. Reported on 11/12/2015 07/04/14  Yes Historical Provider, MD  pregabalin (LYRICA) 75 MG capsule Take 75 mg by mouth 2 (two) times daily.  05/07/13  Yes Historical Provider, MD  tamsulosin (FLOMAX) 0.4 MG CAPS capsule Take 0.4 mg by mouth.   Yes Historical Provider, MD    Scheduled Meds: . chlorhexidine gluconate (MEDLINE KIT)  15 mL Mouth Rinse BID  . Chlorhexidine Gluconate Cloth  6 each Topical Daily  . famotidine  20 mg Oral Daily  . IMMUNE GLOBULIN 10% (HUMAN) IV - For Fluid Restriction Only  400 mg/kg Intravenous Q24H  . mouth rinse  15 mL Mouth Rinse QID  . pantoprazole (PROTONIX) IV  40 mg Intravenous Daily  . polyvinyl alcohol  1 drop Both Eyes TID  . pregabalin  75 mg Per Tube BID  . senna-docusate  1 tablet Oral QHS  . sodium chloride flush  10-40 mL Intracatheter Q12H  . tamsulosin  0.4 mg Oral QPC supper  . thiamine injection  100 mg Intravenous Daily  . vancomycin  1,000 mg Intravenous Q12H   Continuous Infusions: . sodium chloride 100 mL/hr at 06/09/16 2230  . phenylephrine (NEO-SYNEPHRINE) Adult infusion 20 mcg/min (06/10/16 0200)  . propofol (DIPRIVAN) infusion 50 mcg/kg/min (06/10/16 0737)   PRN Meds:.acetaminophen **OR** acetaminophen, fentaNYL (SUBLIMAZE) injection, magnesium hydroxide, midazolam, midazolam,  ondansetron **OR** ondansetron (ZOFRAN) IV, sodium chloride flush     Results for orders placed or performed during the hospital encounter of 06/03/16 (from the past 48 hour(s))  Homocysteine     Status: None   Collection Time: 06/08/16  9:38 AM  Result Value Ref Range   Homocysteine 8.4 0.0 - 15.0 umol/L    Comment: (NOTE) Performed At: [facility] [place], [place] [id number] Lindon Romp MD [id number]   RPR     Status: None   Collection Time: 06/08/16  9:41 AM  Result Value Ref Range   RPR Ser Ql Non Reactive Non Reactive    Comment: (NOTE) Performed At: [facility] [address] [place], [place] [id number] Lindon Romp MD [id number]   ANA, IFA (with reflex)     Status:  None   Collection Time: 06/08/16  9:41 AM  Result Value Ref Range   ANA Ab, IFA Negative     Comment: (NOTE)                                     Negative   <1:80                                     Borderline  1:80                                     Positive   >1:80 Performed At: [facility] [place], [place] [id number] Lindon Romp MD [id number]   Sedimentation rate     Status: Abnormal   Collection Time: 06/08/16  9:41 AM  Result Value Ref Range   Sed Rate 33 (H) 0 - 16 mm/hr  HIV antibody (routine testing) (NOT for [facility])     Status: None   Collection Time: 06/08/16  9:41 AM  Result Value Ref Range   HIV Screen 4th Generation wRfx Non Reactive Non Reactive    Comment: (NOTE) Performed At: [facility] [place], [place] [id number] Lindon Romp MD [id number]   Vitamin B12     Status: None   Collection Time: 06/08/16  9:42 AM  Result Value Ref Range   Vitamin B-12 605 180 - 914 pg/mL    Comment: (NOTE) This assay is not validated for testing neonatal or myeloproliferative syndrome specimens for Vitamin B12 levels. Performed at [facility], [place] [address], [place],  [place] [address]   TSH     Status: Abnormal   Collection Time: 06/08/16  9:42 AM  Result Value Ref Range   TSH 5.323 (H) 0.350 - 4.500 uIU/mL    Comment: Performed by a 3rd Generation assay with a functional sensitivity of <=0.01 uIU/mL.  C-reactive protein     Status: Abnormal   Collection Time: 06/08/16  9:42 AM  Result Value Ref Range   CRP 8.7 (H) <1.0 mg/dL    Comment: Performed at [facility] [address] [place], [place] [address]  MRSA PCR Screening     Status: None   Collection Time: 06/09/16  5:00 PM  Result Value Ref Range   MRSA by PCR NEGATIVE NEGATIVE    Comment:        The GeneXpert MRSA Assay (FDA approved for NASAL specimens only), is one component of a comprehensive MRSA colonization surveillance program. It is not intended to diagnose MRSA infection nor to guide or monitor treatment for MRSA infections.   Blood gas, arterial     Status: None   Collection Time: 06/09/16  6:10 PM  Result Value Ref Range   FIO2 60.00    Delivery systems VENTILATOR    Mode PRESSURE REGULATED VOLUME CONTROL    VT 650 mL   LHR 14.0 resp/min   Peep/cpap 5.0 cm H20   pH, Arterial 7.430 7.350 - 7.450   pCO2 arterial 35.9 32.0 - 48.0 mmHg   pO2, Arterial 92.6 83.0 - 108.0 mmHg   Bicarbonate 24.3 20.0 - 28.0 mmol/L   Acid-base deficit 0.4 0.0 - 2.0 mmol/L   O2 Saturation 96.4 %  Patient temperature 37.0    Collection site RIGHT RADIAL    Drawn by [id number]    Sample type ARTERIAL DRAW    Allens test (pass/fail) PASS PASS  Glucose, capillary     Status: None   Collection Time: 06/09/16  6:16 PM  Result Value Ref Range   Glucose-Capillary 94 65 - 99 mg/dL  Culture, blood (routine x 2)     Status: None (Preliminary result)   Collection Time: 06/09/16  7:15 PM  Result Value Ref Range   Specimen Description BLOOD RIGHT HAND    Special Requests BOTTLES DRAWN AEROBIC AND ANAEROBIC 6CC    Culture NO GROWTH < 12 HOURS    Report Status PENDING   CBC     Status: Abnormal    Collection Time: 06/09/16  7:20 PM  Result Value Ref Range   WBC 9.9 4.0 - 10.5 K/uL   RBC 3.27 (L) 4.22 - 5.81 MIL/uL   Hemoglobin 10.7 (L) 13.0 - 17.0 g/dL   HCT 30.7 (L) 39.0 - 52.0 %   MCV 93.9 78.0 - 100.0 fL   MCH 32.7 26.0 - 34.0 pg   MCHC 34.9 30.0 - 36.0 g/dL   RDW 14.6 11.5 - 15.5 %   Platelets 391 150 - 400 K/uL  Basic metabolic panel     Status: Abnormal   Collection Time: 06/09/16  7:20 PM  Result Value Ref Range   Sodium 141 135 - 145 mmol/L   Potassium 4.6 3.5 - 5.1 mmol/L   Chloride 108 101 - 111 mmol/L   CO2 24 22 - 32 mmol/L   Glucose, Bld 97 65 - 99 mg/dL   BUN 41 (H) 6 - 20 mg/dL   Creatinine, Ser 1.51 (H) 0.61 - 1.24 mg/dL   Calcium 8.3 (L) 8.9 - 10.3 mg/dL   GFR calc non Af Amer 47 (L) >60 mL/min   GFR calc Af Amer 54 (L) >60 mL/min    Comment: (NOTE) The eGFR has been calculated using the CKD EPI equation. This calculation has not been validated in all clinical situations. eGFR's persistently <60 mL/min signify possible Chronic Kidney Disease.    Anion gap 9 5 - 15  Culture, blood (routine x 2)     Status: None (Preliminary result)   Collection Time: 06/09/16  7:20 PM  Result Value Ref Range   Specimen Description BLOOD LEFT HAND    Special Requests BOTTLES DRAWN AEROBIC AND ANAEROBIC 6CC    Culture NO GROWTH < 12 HOURS    Report Status PENDING   Lactic acid, plasma     Status: Abnormal   Collection Time: 06/09/16  7:20 PM  Result Value Ref Range   Lactic Acid, Venous 2.3 (HH) 0.5 - 1.9 mmol/L    Comment: CRITICAL RESULT CALLED TO, READ BACK BY AND VERIFIED WITH: JESS,JA T 2050 ON 06/09/2016 BY ISLEY,B   Lactic acid, plasma     Status: None   Collection Time: 06/09/16 11:46 PM  Result Value Ref Range   Lactic Acid, Venous 1.0 0.5 - 1.9 mmol/L  Basic metabolic panel     Status: Abnormal   Collection Time: 06/10/16  3:28 AM  Result Value Ref Range   Sodium 140 135 - 145 mmol/L   Potassium 4.1 3.5 - 5.1 mmol/L   Chloride 112 (H) 101 - 111 mmol/L    CO2 22 22 - 32 mmol/L   Glucose, Bld 134 (H) 65 - 99 mg/dL   BUN 41 (H) 6 - 20 mg/dL  Creatinine, Ser 1.15 0.61 - 1.24 mg/dL   Calcium 7.9 (L) 8.9 - 10.3 mg/dL   GFR calc non Af Amer >60 >60 mL/min   GFR calc Af Amer >60 >60 mL/min    Comment: (NOTE) The eGFR has been calculated using the CKD EPI equation. This calculation has not been validated in all clinical situations. eGFR's persistently <60 mL/min signify possible Chronic Kidney Disease.    Anion gap 6 5 - 15  CBC     Status: Abnormal   Collection Time: 06/10/16  3:28 AM  Result Value Ref Range   WBC 7.7 4.0 - 10.5 K/uL   RBC 3.23 (L) 4.22 - 5.81 MIL/uL   Hemoglobin 10.5 (L) 13.0 - 17.0 g/dL   HCT 30.2 (L) 39.0 - 52.0 %   MCV 93.5 78.0 - 100.0 fL   MCH 32.5 26.0 - 34.0 pg   MCHC 34.8 30.0 - 36.0 g/dL   RDW 14.9 11.5 - 15.5 %   Platelets 355 150 - 400 K/uL  Ammonia     Status: None   Collection Time: 06/10/16  3:28 AM  Result Value Ref Range   Ammonia 18 9 - 35 umol/L  Triglycerides     Status: None   Collection Time: 06/10/16  3:28 AM  Result Value Ref Range   Triglycerides 128 <150 mg/dL  Protein and glucose, CSF     Status: None (Preliminary result)   Collection Time: 06/10/16  8:00 AM  Result Value Ref Range   Glucose, CSF 62 40 - 70 mg/dL   Total  Protein, CSF PENDING 15 - 45 mg/dL  Glucose, capillary     Status: Abnormal   Collection Time: 06/10/16  8:28 AM  Result Value Ref Range   Glucose-Capillary 109 (H) 65 - 99 mg/dL   Comment 1 Notify RN    Comment 2 Document in Chart     Studies/Results:  CTA LUNG IMPRESSION: Manubrial fracture  No evidence of pulmonary emboli.  Left lower lobe infiltrate and effusion.     BRAIN MRI FINDINGS: Brain: Cerebral volume is within normal limits for age. No restricted diffusion to suggest acute infarction. No midline shift, mass effect, evidence of mass lesion, ventriculomegaly, extra-axial collection or acute intracranial hemorrhage.  Cervicomedullary junction and pituitary are within normal limits.  Pearline Cables and white matter signal is within normal limits for age throughout the brain. No cortical encephalomalacia or chronic cerebral blood products.  Vascular: Major intracranial vascular flow voids are preserved.  Skull and upper cervical spine: Negative. Normal bone marrow signal.  Sinuses/Orbits: Normal orbits soft tissues. Trace paranasal sinus mucosal thickening.  Other: Visible internal auditory structures appear normal. Mastoids are clear. Negative scalp soft tissues.  IMPRESSION: No acute intracranial abnormality. Normal for age noncontrast MRI appearance of the brain.            L SPINE MRI 2015 FINDINGS: Trace anterolisthesis of L3 on L4 and L4 on L5 is unchanged. There is grade 1 retrolisthesis of L1 on L2, stable to minimally increased from prior MRI. New from the prior MRI is a mild compression deformity involving the L2 superior endplate with approximately 10% vertebral body height loss. There is abnormal fluid signal within the L1-2 disc space, and there is also new edema throughout the L1 and L2 vertebral bodies. There is paraspinal inflammatory change/ phlegmon at this level, and there is a 1.9 x 1.3 cm fluid collection in the left psoas muscle at the L2 level (series 5, image 16).  Advanced  disc space height loss at L5-S1 is unchanged, however there is increased fluid signal within the disc space compared to the prior MRI with mildly increased marrow edema within the adjacent L5 and S1 vertebral bodies. The conus medullaris is normal in signal and terminates at the superior aspect of L2.  T11-12: Only imaged sagittally. Facet hypertrophy results in mild bilateral neural foraminal narrowing, right greater than left and unchanged.  T12-L1:  Negative.  L1-2: Sequelae of prior laminectomy and posterior fusion are again identified. Posterior retropulsion of the superior L2  endplate, greater on the right, with possibly small adjacent epidural fluid collection, results in new narrowing of the right lateral recess and increased right neural foraminal narrowing. No spinal canal stenosis.  L2-3: Mild disc bulge and facet hypertrophy result in mild right lateral recess and neural foraminal narrowing, unchanged.  L3-4: Mild disc bulge and moderate facet hypertrophy result in mild right neural foraminal narrowing, unchanged. No spinal canal stenosis.  L4-5: Left foraminal disc protrusion and mild-to-moderate facet hypertrophy result in mild to moderate left neural foraminal stenosis, unchanged. No spinal canal stenosis.  L5-S1: Disc bulge and mild facet hypertrophy result in mild to moderate bilateral neural foraminal stenosis, unchanged. No spinal canal stenosis.  IMPRESSION: 1. Changes at L1-2 as above concerning for discitis/osteomyelitis with small left psoas abscess. New, mild compression deformity of the L2 superior endplate and mild retropulsion results in new right lateral recess narrowing and increased right neural foraminal narrowing at L1-2. Small ventral epidural fluid collection is not excluded on the right. 2. Advanced degenerative disc disease at L5-S1 with a small amount of new fluid signal in the disc space and mildly increased adjacent marrow changes. Early discitis/osteomyelitis is not excluded.       Tyaisha Cullom A. Merlene Rose, M.D.  Diplomate, Tax adviser of Psychiatry and Neurology ( Neurology). 06/10/2016, 8:56 AM

## 2016-06-10 NOTE — Progress Notes (Addendum)
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 the last few days due to inability to walk. Pain and bruising lt foot. Pt held for imaging. Hx of surgery on the left knee and ankle. He has had problems since the surgery. EXAM: LEFT FOOT - COMPLETE 3+ VIEW COMPARISON:  None. FINDINGS: No fracture.  No bone lesion. The joints are normally aligned. Small amount of calcification at the base of the plantar fascia adjacent to the calcaneal tuberosity. Soft tissues are otherwise unremarkable. IMPRESSION: No fracture, bone lesion or significant joint abnormality. No acute finding. Electronically Signed   By: Lajean Manes M.D.   On: 06/03/2016 11:24    Time Spent in minutes  Critical care: 77mns   Brittanny Levenhagen M.D on 06/10/2016 at 10:06 AM  Between 7am to 7pm - Pager - [phone] After 7pm go to [email or web address] - password [email or web address] -  Office  [phone]

## 2016-06-10 NOTE — Progress Notes (Signed)
[facility] Progress Note Patient Name: Marc Rose DOB: November 11, 1950 MRN: [id number]   Date of Service  06/10/2016  HPI/Events of Note  Extreme agitation post intubation  eICU Interventions  PAD protocol wit propofol, PRN fentanyl and PRN midaz. RASS goal -1, -2     Intervention Category Major Interventions: Delirium, psychosis, severe agitation - evaluation and management  Wilhelmina Mcardle 06/10/2016, 3:22 AM

## 2016-06-10 NOTE — Progress Notes (Signed)
Initial Nutrition Assessment  DOCUMENTATION CODES:  Non-severe (moderate) malnutrition in context of acute illness/injury   Pt meets criteria for MODERATE MALNUTRITION in the context of ACUTE ILLNESS as evidenced by Mild Muscle/fat wasting.  INTERVENTION:  Given anticipation of remaining on ventilator support for several days, recommend initiation of TF.   Current appropriate regimen would be: Vital High Protein at goal rate of 60 ml/h (1440 ml per day) 1440 kcals (+367 from Diprivan), 126 gm protein, 1204 ml free water daily.  NUTRITION DIAGNOSIS:  Inadequate oral intake related to inability to eat as evidenced by NPO status.  GOAL:  Patient will meet greater than or equal to 90% of their needs  MONITOR:  Diet advancement, Vent status, Labs  REASON FOR ASSESSMENT:  Malnutrition Screening Tool    ASSESSMENT:  66 y/o male PMhx DM, PAD, HTN, Depression/Anxiety, Tobacco abuse, urinary retention. Originally had presented due to BLE weakness that had resulted in falls and traumatic skin lesions. Worked up for UTI, AKI. 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  Mental status worsened and had difficulty protecting airway. Intubated 2/7.   pt's po intake had been very good up until roughly 2/5-2/6 when he began refusing to eat 1 of his meals each day (ate >50% other 2 meals). Last meal Dinner 2/6.   ~48 hrs since substantial PO intake, though receiving nutrition from Propofol   Patient is currently intubated on ventilator support MV: 9.5 L/min Temp (24hrs), Avg:98.3 F (36.8 C), Min:97.8 F (36.6 C), Max:98.8 F (37.1 C)  Propofol: 13.9 ml/hr = 367 kcal/day  Using weights from "Care [facility]". Appeared to weigh 175 lbs this past October with 10 lbs loss   Labs: TG: 128, Afebrile, BG 100-140 mg/dl Medications: Pepcid, PPI, IVIG, Senna, Thiamin, IV ABx,  propofol   Recent Labs Lab 06/08/16 0526 06/09/16 1920 06/10/16 0328   NA 136 141 140  K 4.3 4.6 4.1  CL 102 108 112*  CO2 27 24 22   BUN 22* 41* 41*  CREATININE 0.98 1.51* 1.15  CALCIUM 8.7* 8.3* 7.9*  GLUCOSE 109* 97 134*   Diet Order:  Diet NPO time specified  Skin: Scattered abrasions to arms, knees, legs. Skin Pale  Last BM:  2/5  Height:  Ht Readings from Last 1 Encounters:  06/03/16 6\' 2"  (1.88 m)   Weight:  Wt Readings from Last 1 Encounters:  06/10/16 160 lb 0.9 oz (72.6 kg)   Wt Readings from Last 10 Encounters:  06/10/16 160 lb 0.9 oz (72.6 kg)  05/17/16 165 lb (74.8 kg)  01/21/16 175 lb (79.4 kg)  11/12/15 178 lb (80.7 kg)  05/15/15 187 lb 8 oz (85 kg)  02/05/15 179 lb (81.2 kg)  07/05/14 169 lb 6.4 oz (76.8 kg)  09/18/13 141 lb 8.6 oz (64.2 kg)  08/24/13 155 lb (70.3 kg)  06/05/13 162 lb (73.5 kg)   Ideal Body Weight:  86.36 kg  BMI:  Body mass index is 20.55 kg/m.  Estimated Nutritional Needs:  Kcal:  1800 kcals Protein:  110-130 g (1.5-1.8 g/kg bw) Fluid:  >2.1 L liters  EDUCATION NEEDS:  No education needs identified at this time  Burtis Junes RD, LDN, [facility] Pager: [phone] 06/10/2016 2:39 PM

## 2016-06-10 NOTE — Progress Notes (Signed)
Patient's OG tube replaced. Number 18 placed. Placement verified by 2 Rn's. Placed on intermittent suction. Patient tolerated procedure well.  Margaret Pyle, RN

## 2016-06-10 NOTE — Progress Notes (Signed)
Consult requested by: [facility] Consult requested for acute respiratory failure:  HPI: This is a 66 year old who was admitted to the hospital with ataxia. He was being set up for lumbar puncture but then became more acutely confused then developed respiratory distress and was intubated and placed on mechanical ventilation. He initially had a urinary tract infection which apparently is what started his problem. At baseline he has significant problems with his legs after multiple surgeries. He is unable to give any history because he is intubated and sedated so history is from the medical record. He is on propofol after failing other sedatives. He is still requiring 60% oxygen. Chest x-ray this morning is pending. Blood gas this morning is pending  Past Medical History:  Diagnosis Date  . Anxiety state, unspecified   . Arthritis   . 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 Benign neoplasm of colon   . Causalgia of upper limb    Left limb  . Chronic back pain   . Chronic leg pain   . Depression   . Diabetes mellitus   . Diskitis 02/05/2015  . Drug rash 11/12/2015  . Elevated blood pressure reading without diagnosis of hypertension   . Essential hypertension, benign   . Hardware complicating wound infection ([place]) 02/05/2015  . Hyperpigmentation 05/15/2015  . Hypertension   . Impaired fasting glucose   . Peripheral arterial disease ([place])   . Pure hypercholesterolemia   . Tobacco use disorder   . Unspecified retinal detachment      Family History  Problem Relation Age of Onset  . COPD Mother   . Hyperthyroidism Mother   . Hyperthyroidism Sister      Social History   Social History  . Marital status: Widowed    Spouse name: N/A  . Number of children: N/A  . Years of education: N/A   Social History Main Topics  . Smoking status: Current Every Day Smoker    Packs/day: 0.50    Years: 39.00    Types: Cigarettes  . Smokeless tobacco: Never Used  . Alcohol use No     Comment: occasionally   . Drug use: Yes    Types: Marijuana  . Sexual activity: Not Asked   Other Topics Concern  . None   Social History Narrative  . None     ROS: Unobtainable    Objective: Vital signs in last 24 hours: Temp:  [97.7 F (36.5 C)-98.8 F (37.1 C)] 97.8 F (36.6 C) (02/08 0500) Pulse Rate:  [43-132] 44 (02/08 0645) Resp:  [16-49] 16 (02/08 0645) BP: (45-168)/(34-108) 106/65 (02/08 0645) SpO2:  [98 %-100 %] 100 % (02/08 0645) FiO2 (%):  [60 %] 60 % (02/08 0317) Weight:  [72.6 kg (160 lb 0.9 oz)-77 kg (169 lb 12.1 oz)] 72.6 kg (160 lb 0.9 oz) (02/08 0500) Weight change:  Last BM Date: 06/07/16  Intake/Output from previous day: 02/07 0701 - 02/08 0700 In: 4004.5 [I.V.:4004.5] Out: 1450 [Urine:1450]  PHYSICAL EXAM Constitutional: Intubated sedated on mechanical ventilation. Eyes: Pupils react ears nose mouth and throat: Mucous membranes are moist. Throat is clear. Cardiovascular: His heart is regular with normal heart sounds. No edema. Respiratory: He is intubated and on the ventilator. His lungs are clear. Gastrointestinal: His abdomen is soft with no masses. Musculoskeletal: Not able to assess. Neurological: Not able to assess. Skin: Warm and dry. Psychiatric: Not able to assess  Lab Results: Basic Metabolic Panel:  Recent Labs  06/09/16 1920 06/10/16 0328  NA 141 140  K 4.6  4.1  CL 108 112*  CO2 24 22  GLUCOSE 97 134*  BUN 41* 41*  CREATININE 1.51* 1.15  CALCIUM 8.3* 7.9*   Liver Function Tests:  Recent Labs  06/08/16 0526  AST 42*  ALT 41  ALKPHOS 59  BILITOT 0.8  PROT 5.4*  ALBUMIN 2.8*   No results for input(s): LIPASE, AMYLASE in the last 72 hours.  Recent Labs  06/10/16 0328  AMMONIA 18   CBC:  Recent Labs  06/09/16 1920 06/10/16 0328  WBC 9.9 7.7  HGB 10.7* 10.5*  HCT 30.7* 30.2*  MCV 93.9 93.5  PLT 391 355   Cardiac Enzymes: No results for input(s): CKTOTAL, CKMB, CKMBINDEX, TROPONINI in the last 72 hours. BNP: No results for  input(s): PROBNP in the last 72 hours. D-Dimer: No results for input(s): DDIMER in the last 72 hours. CBG:  Recent Labs  06/09/16 1816  GLUCAP 94   Hemoglobin A1C: No results for input(s): HGBA1C in the last 72 hours. Fasting Lipid Panel:  Recent Labs  06/10/16 0328  TRIG 128   Thyroid Function Tests:  Recent Labs  06/08/16 0942  TSH 5.323*   Anemia Panel:  Recent Labs  06/08/16 0942  VITAMINB12 605   Coagulation: No results for input(s): LABPROT, INR in the last 72 hours. Urine Drug Screen: Drugs of Abuse  No results found for: LABOPIA, COCAINSCRNUR, LABBENZ, AMPHETMU, THCU, LABBARB  Alcohol Level: No results for input(s): ETH in the last 72 hours. Urinalysis: No results for input(s): COLORURINE, LABSPEC, PHURINE, GLUCOSEU, HGBUR, BILIRUBINUR, KETONESUR, PROTEINUR, UROBILINOGEN, NITRITE, LEUKOCYTESUR in the last 72 hours.  Invalid input(s): APPERANCEUR Misc. Labs:   ABGS:  Recent Labs  06/09/16 1810  PHART 7.430  PO2ART 92.6  HCO3 24.3     MICROBIOLOGY: Recent Results (from the past 240 hour(s))  Urine culture     Status: Abnormal   Collection Time: 06/03/16 12:59 PM  Result Value Ref Range Status   Specimen Description URINE, CLEAN CATCH  Final   Special Requests NONE  Final   Culture >=100,000 COLONIES/mL ENTEROCOCCUS FAECALIS (A)  Final   Report Status 06/06/2016 FINAL  Final   Organism ID, Bacteria ENTEROCOCCUS FAECALIS (A)  Final      Susceptibility   Enterococcus faecalis - MIC*    AMPICILLIN <=2 SENSITIVE Sensitive     LEVOFLOXACIN 1 SENSITIVE Sensitive     NITROFURANTOIN <=16 SENSITIVE Sensitive     VANCOMYCIN 1 SENSITIVE Sensitive     * >=100,000 COLONIES/mL ENTEROCOCCUS FAECALIS  MRSA PCR Screening     Status: None   Collection Time: 06/09/16  5:00 PM  Result Value Ref Range Status   MRSA by PCR NEGATIVE NEGATIVE Final    Comment:        The GeneXpert MRSA Assay (FDA approved for NASAL specimens only), is one component of  a comprehensive MRSA colonization surveillance program. It is not intended to diagnose MRSA infection nor to guide or monitor treatment for MRSA infections.   Culture, blood (routine x 2)     Status: None (Preliminary result)   Collection Time: 06/09/16  7:15 PM  Result Value Ref Range Status   Specimen Description BLOOD RIGHT HAND  Final   Special Requests BOTTLES DRAWN AEROBIC AND ANAEROBIC 6CC  Final   Culture PENDING  Incomplete   Report Status PENDING  Incomplete  Culture, blood (routine x 2)     Status: None (Preliminary result)   Collection Time: 06/09/16  7:20 PM  Result Value Ref Range  Status   Specimen Description BLOOD LEFT HAND  Final   Special Requests BOTTLES DRAWN AEROBIC AND ANAEROBIC [facility]  Final   Culture PENDING  Incomplete   Report Status PENDING  Incomplete    Studies/Results: Portable Chest Xray  Result Date: 06/09/2016 CLINICAL DATA:  Respiratory failure. EXAM: PORTABLE CHEST 1 VIEW COMPARISON:  Radiographs of June 04, 2016. FINDINGS: The heart size and mediastinal contours are within normal limits. Both lungs are clear. No pneumothorax or pleural effusion is noted. Endotracheal tube is seen projected over tracheal air shadow with distal tip 4 cm above the carina. Nasogastric tube tip is seen in proximal stomach. The visualized skeletal structures are unremarkable. IMPRESSION: Endotracheal tube in grossly good position. Nasogastric tube tip seen in proximal stomach. No acute cardiopulmonary abnormality seen. Electronically Signed   By: Marijo Conception, M.D.   On: 06/09/2016 18:07    Medications:  Prior to Admission:  Prescriptions Prior to Admission  Medication Sig Dispense Refill Last Dose  . aspirin EC 81 MG tablet Take 81 mg by mouth daily.   Past Week at Unknown time  . benazepril (LOTENSIN) 40 MG tablet Take 40 mg by mouth daily.   Past Week at Unknown time  . cyclobenzaprine (FLEXERIL) 10 MG tablet Take 1 tablet (10 mg total) by mouth 3 (three) times  daily as needed for muscle spasms. 30 tablet 0 Past Week at Unknown time  . diazepam (VALIUM) 10 MG tablet Take 0.5 tablets (5 mg total) by mouth every 6 (six) hours as needed for anxiety (back spasms). (Patient taking differently: Take 10 mg by mouth 4 (four) times daily as needed for anxiety (back spasms). ) 30 tablet 0 Past Week at Unknown time  . doxycycline (VIBRA-TABS) 100 MG tablet Take 1 tablet (100 mg total) by mouth 2 (two) times daily. 60 tablet 11 Past Week at Unknown time  . fluticasone (FLONASE) 50 MCG/ACT nasal spray Place 1 spray into the nose daily as needed for allergies.    unknown  . NEOMYCIN-POLYMYXIN-HYDROCORTISONE (CORTISPORIN) 1 % SOLN otic solution Place 2 drops in ear(s) daily. Reported on 11/12/2015   Past Week at Unknown time  . pregabalin (LYRICA) 75 MG capsule Take 75 mg by mouth 2 (two) times daily.    Past Week at Unknown time  . tamsulosin (FLOMAX) 0.4 MG CAPS capsule Take 0.4 mg by mouth.   Past Week at Unknown time   Scheduled: . chlorhexidine gluconate (MEDLINE KIT)  15 mL Mouth Rinse BID  . Chlorhexidine Gluconate Cloth  6 each Topical Daily  . famotidine  20 mg Oral Daily  . IMMUNE GLOBULIN 10% (HUMAN) IV - For Fluid Restriction Only  400 mg/kg Intravenous Q24H  . mouth rinse  15 mL Mouth Rinse QID  . pantoprazole (PROTONIX) IV  40 mg Intravenous Daily  . polyvinyl alcohol  1 drop Both Eyes TID  . pregabalin  75 mg Per Tube BID  . senna-docusate  1 tablet Oral QHS  . sodium chloride flush  10-40 mL Intracatheter Q12H  . tamsulosin  0.4 mg Oral QPC supper  . thiamine injection  100 mg Intravenous Daily  . vancomycin  1,000 mg Intravenous Q12H   Continuous: . sodium chloride 100 mL/hr at 06/09/16 2230  . phenylephrine (NEO-SYNEPHRINE) Adult infusion 20 mcg/min (06/10/16 0200)  . propofol (DIPRIVAN) infusion 50 mcg/kg/min (06/10/16 0737)   OMV:[id number] **OR** acetaminophen, fentaNYL (SUBLIMAZE) injection, magnesium hydroxide, midazolam, midazolam,  ondansetron **OR** ondansetron (ZOFRAN) IV, sodium chloride flush  Assesment: He  was admitted with urinary tract infection. 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 He is on 60% oxygen. No opportunity for weaning now. Chest x-ray from yesterday which I personally reviewed shows the endotracheal tube and nasogastric tube are in good position. No acute infiltrate. Chest x-ray and blood gas are pending this morning Principal Problem:   UTI (urinary tract infection) due to Enterococcus Active Problems:   HTN (hypertension)   Abdominal pain, generalized   Long term current use of antibiotics   Back pain, chronic   Discitis of lumbar region   Nerve disease, peripheral (HCC)   Neurogenic urinary bladder disorder   AKI (acute kidney injury) ([place])   Ataxia   Muscle weakness (generalized)   Ataxic gait   Tachycardia    Plan: Continue ventilator support    LOS: 7 days   Linna Thebeau L 06/10/2016, 7:41 AM

## 2016-06-11 ENCOUNTER — Inpatient Hospital Stay (HOSPITAL_COMMUNITY): Payer: PPO

## 2016-06-11 DIAGNOSIS — R2689 Other abnormalities of gait and mobility: Secondary | ICD-10-CM | POA: Diagnosis not present

## 2016-06-11 DIAGNOSIS — E440 Moderate protein-calorie malnutrition: Secondary | ICD-10-CM

## 2016-06-11 DIAGNOSIS — R4182 Altered mental status, unspecified: Secondary | ICD-10-CM | POA: Diagnosis not present

## 2016-06-11 DIAGNOSIS — G629 Polyneuropathy, unspecified: Secondary | ICD-10-CM | POA: Diagnosis not present

## 2016-06-11 LAB — BLOOD GAS, ARTERIAL
ACID-BASE EXCESS: 2.2 mmol/L — AB (ref 0.0–2.0)
Acid-base deficit: 2.3 mmol/L — ABNORMAL HIGH (ref 0.0–2.0)
BICARBONATE: 22.7 mmol/L (ref 20.0–28.0)
Bicarbonate: 23.2 mmol/L (ref 20.0–28.0)
DRAWN BY: 22223
DRAWN BY: 270161
FIO2: 40
FIO2: 30
MECHVT: 650 mL
MECHVT: 550 mL
O2 Saturation: 94.5 %
O2 Saturation: 96 %
PEEP: 5 cmH2O
PEEP: 5 cmH2O
PH ART: 7.473 — AB (ref 7.350–7.450)
PO2 ART: 89 mmHg (ref 83.0–108.0)
Patient temperature: 98.9
RATE: 14 resp/min
RATE: 14 resp/min
pCO2 arterial: 28.9 mmHg — ABNORMAL LOW (ref 32.0–48.0)
pCO2 arterial: 34 mmHg (ref 32.0–48.0)
pH, Arterial: 7.417 (ref 7.350–7.450)
pO2, Arterial: 74.8 mmHg — ABNORMAL LOW (ref 83.0–108.0)

## 2016-06-11 LAB — CBC
HCT: 30.4 % — ABNORMAL LOW (ref 39.0–52.0)
HEMOGLOBIN: 10.9 g/dL — AB (ref 13.0–17.0)
MCH: 33.7 pg (ref 26.0–34.0)
MCHC: 35.9 g/dL (ref 30.0–36.0)
MCV: 94.1 fL (ref 78.0–100.0)
PLATELETS: 324 10*3/uL (ref 150–400)
RBC: 3.23 MIL/uL — AB (ref 4.22–5.81)
RDW: 15.5 % (ref 11.5–15.5)
WBC: 7 10*3/uL (ref 4.0–10.5)

## 2016-06-11 LAB — GLUCOSE, CAPILLARY
GLUCOSE-CAPILLARY: 105 mg/dL — AB (ref 65–99)
GLUCOSE-CAPILLARY: 106 mg/dL — AB (ref 65–99)
GLUCOSE-CAPILLARY: 107 mg/dL — AB (ref 65–99)
GLUCOSE-CAPILLARY: 124 mg/dL — AB (ref 65–99)

## 2016-06-11 LAB — BASIC METABOLIC PANEL
ANION GAP: 4 — AB (ref 5–15)
BUN: 25 mg/dL — ABNORMAL HIGH (ref 6–20)
CALCIUM: 7.6 mg/dL — AB (ref 8.9–10.3)
CHLORIDE: 114 mmol/L — AB (ref 101–111)
CO2: 22 mmol/L (ref 22–32)
CREATININE: 0.79 mg/dL (ref 0.61–1.24)
GFR calc non Af Amer: >60 mL/min (ref >60)
Glucose, Bld: 105 mg/dL — ABNORMAL HIGH (ref 65–99)
Potassium: 3.5 mmol/L (ref 3.5–5.1)
SODIUM: 140 mmol/L (ref 135–145)

## 2016-06-11 LAB — VDRL, CSF: SYPHILIS VDRL QUANT CSF: NONREACTIVE

## 2016-06-11 LAB — HERPES SIMPLEX VIRUS(HSV) DNA BY PCR
HSV 1 DNA: NEGATIVE
HSV 2 DNA: NEGATIVE

## 2016-06-11 NOTE — Progress Notes (Signed)
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 seen in proximal stomach. The visualized skeletal  structures are unremarkable. IMPRESSION: Endotracheal tube in grossly good position. Nasogastric tube tip seen in proximal stomach. No acute cardiopulmonary abnormality seen. Electronically Signed   By: Marijo Conception, M.D.   On: 06/09/2016 18:07   Dg Foot Complete Left  Result Date: 06/03/2016 CLINICAL DATA:  Increased weakness x 2 weeks. Has hard time walking, multiple falls. Pt says that he has been crawling around the house the last few days due to inability to walk. Pain and bruising lt foot. Pt held for imaging. Hx of surgery on the left knee and ankle. He has had problems since the surgery. EXAM: LEFT FOOT - COMPLETE 3+ VIEW COMPARISON:  None. FINDINGS: No fracture.  No bone lesion. The joints are normally aligned. Small amount of calcification at the base of the plantar fascia adjacent to the calcaneal tuberosity. Soft tissues are otherwise unremarkable. IMPRESSION: No fracture, bone lesion or  significant joint abnormality. No acute finding. Electronically Signed   By: Lajean Manes M.D.   On: 06/03/2016 11:24    Time Spent in minutes  Critical care: 46mns   Elaisha Zahniser M.D on 06/11/2016 at 4:07 PM  Between 7am to 7pm - Pager - [phone] After 7pm go to [email or web address] - password [email or web address] -  Office  [phone]

## 2016-06-11 NOTE — Progress Notes (Addendum)
Nutrition Follow-up  DOCUMENTATION CODES:  Non-severe (moderate) malnutrition in context of acute illness/injury   Pt meets criteria for MODERATE MALNUTRITION  INTERVENTION:  Increase Vital High Protein to goal rate 55 ml/h (1440 ml per day) 1320 kcals (+488 from [place]), 116 gm protein, 1104 ml free water daily.  NUTRITION DIAGNOSIS:  Inadequate oral intake related to inability to eat as evidenced by NPO status.  GOAL:  Patient will meet greater than or equal to 90% of their needs  MONITOR:  Diet advancement, Vent status, Labs    ASSESSMENT:  66 y/o male PMhx DM, PAD, HTN, Depression/Anxiety, Tobacco abuse, urinary retention. Originally had presented due to BLE weakness that had resulted in falls and traumatic skin lesions. Worked up for UTI, AKI. 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  Mental status worsened and had difficulty protecting airway. Intubated 2/7.   pt's po intake had been very good up until roughly 2/5-2/6 when he began refusing to eat 1 of his meals each day (ate >50% other 2 meals). Last meal Dinner 2/6.   Tube feeding has been started, though currently only at 40 cc/hr.   Propofol rate increased.   Abdomen non distended. Bear hugger on   Patient is currently intubated on ventilator support MV: 7.8 L/min Temp (24hrs), Avg:97.7 F (36.5 C), Min:94.4 F (34.7 C), Max:99.2 F (37.3 C)  Propofol: 18.5 ml/hr = 488 kcal/day  Using weights from "Care [facility]". Appeared to weigh 175 lbs this past October with 10 lbs loss   Labs: TG: 128, Afebrile, BG 100-140 mg/dl Medications: Pepcid, PPI, IVIG, Senna, Thiamin, IV ABx,  propofol   Recent Labs Lab 06/09/16 1920 06/10/16 0328 06/11/16 0406  NA 141 140 140  K 4.6 4.1 3.5  CL 108 112* 114*  CO2 24 22 22   BUN 41* 41* 25*  CREATININE 1.51* 1.15 0.79  CALCIUM 8.3* 7.9* 7.6*  GLUCOSE 97 134* 105*   Diet Order:  Diet NPO time specified  Skin:  Scattered abrasions to arms, knees, legs. Skin Pale  Last BM:  2/5  Height:  Ht Readings from Last 1 Encounters:  06/03/16 6\' 2"  (1.88 m)   Weight:  Wt Readings from Last 1 Encounters:  06/11/16 163 lb 12.8 oz (74.3 kg)   Wt Readings from Last 10 Encounters:  06/11/16 163 lb 12.8 oz (74.3 kg)  05/17/16 165 lb (74.8 kg)  01/21/16 175 lb (79.4 kg)  11/12/15 178 lb (80.7 kg)  05/15/15 187 lb 8 oz (85 kg)  02/05/15 179 lb (81.2 kg)  07/05/14 169 lb 6.4 oz (76.8 kg)  09/18/13 141 lb 8.6 oz (64.2 kg)  08/24/13 155 lb (70.3 kg)  06/05/13 162 lb (73.5 kg)  Dosing weight: 160 lbs 1 oz  Ideal Body Weight:  86.36 kg  BMI:  Body mass index is 21.03 kg/m.  Estimated Nutritional Needs:  Kcal:  1800 kcals Protein:  110-130 g (1.5-1.8 g/kg bw) Fluid:  >2.1 L liters  EDUCATION NEEDS:  No education needs identified at this time  Burtis Junes RD, LDN, [facility] Pager: [phone] 06/11/2016 2:16 PM

## 2016-06-11 NOTE — Progress Notes (Addendum)
[place] A. Marc Laughter, MD     [email or web address]          Marc Rose is an 66 y.o. male.   ASSESSMENT/PLAN:      Severe delirium/encephalopathy of unclear etiology at this time. We are suspecting that this may be multifactorial including UTI and medication effect from Levaquin. Levaquin has been discontinued. The picture seems most consistent with toxic metabolic encephalopathy/delirium as opposed to primary and CNS problem. The patient's spinal fluid analysis shows the typical formula with elevated protein and essentially unremarkable white cell count (cytoalbuminologic dissociation) seen in demyelinating conditions such as Guillain-Barr syndrome. The CSF does not show evidence of central nervous system infection. I think it is reasonable to attempt to wean off the ventilator a day or 2 from now. However, we may have to start the patient on maintenance dose of Haldol for behavioral and movement management.    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 Complete 5 day course of IV IG    Low back pain status post L1-L2 laminectomy. HX of chronic discitis             Again the patient is heavily sedated on propofol and also when necessary fentanyl. Agitation is well controlled. No abnormal jerking movements. He has been hemodynamically stable. He is off vasopressors.    GENERAL: He is intubated and quite sedated on propofol.   HEENT: Supple. Atraumatic normocephalic. Dense cataracts both sides.  ABDOMEN: soft  EXTREMITIES: No edema. Multiple abrasions and lacerations involving the knees and legs.   BACK: Normal.  SKIN: Normal by inspection.    MENTAL STATUS: He is awake but very restless and moving around. He is pulling out things and moving around continuously. Speech is nonsensical and incomprehensible.  CRANIAL NERVES: Coronary  reflexes are absent but pupils are reactive. Oculocephalic reflexes limited but seems fine. He has no response to deep painful stimuli.   MOTOR: No response to deep painful stimuli but again he is heavily sedated on propofol.  COORDINATION: No tremor or dysmetria  REFLEXES: The patient is areflexic in the legs and upper extremities.      Blood pressure 126/84, pulse 93, temperature 99.1 F (37.3 C), temperature source Axillary, resp. rate 14, height '6\' 2"'  (1.88 m), weight 163 lb 12.8 oz (74.3 kg), SpO2 100 %.  Past Medical History:  Diagnosis Date  . Anxiety state, unspecified   . Arthritis   . 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 Benign neoplasm of colon   . Causalgia of upper limb    Left limb  . Chronic back pain   . Chronic leg pain   . Depression   . Diabetes mellitus   . Diskitis 02/05/2015  . Drug rash 11/12/2015  . Elevated blood pressure reading without diagnosis of hypertension   . Essential hypertension, benign   . Hardware complicating wound infection ([place]) 02/05/2015  . Hyperpigmentation 05/15/2015  . Hypertension   . Impaired fasting glucose   . Peripheral arterial disease ([place])   . Pure hypercholesterolemia   . Tobacco use disorder   . Unspecified retinal detachment     Past Surgical History:  Procedure Laterality Date  . ANKLE SURGERY     left  . APPENDECTOMY    . BACK SURGERY    . COLONOSCOPY    . KNEE SURGERY     left    Family History  Problem Relation Age of Onset  .  COPD Mother   . Hyperthyroidism Mother   . Hyperthyroidism Sister     Social History:  reports that he has been smoking Cigarettes.  He has a 19.50 pack-year smoking history. He has never used smokeless tobacco. He reports that he uses drugs, including Marijuana. He reports that he does not drink alcohol.  Allergies:  Allergies  Allergen Reactions  . Codeine Anaphylaxis, Hives and Swelling  . Penicillins Anaphylaxis, Hives and Swelling  . Latex Itching  . Strawberry Extract Hives     Medications: Prior to Admission medications   Medication Sig Start Date End Date Taking? Authorizing Provider  aspirin EC 81 MG tablet Take 81 mg by mouth daily.   Yes Historical Provider, MD  benazepril (LOTENSIN) 40 MG tablet Take 40 mg by mouth daily.   Yes Historical Provider, MD  cyclobenzaprine (FLEXERIL) 10 MG tablet Take 1 tablet (10 mg total) by mouth 3 (three) times daily as needed for muscle spasms. 09/19/13  Yes Adeline Saralyn Pilar, MD  diazepam (VALIUM) 10 MG tablet Take 0.5 tablets (5 mg total) by mouth every 6 (six) hours as needed for anxiety (back spasms). Patient taking differently: Take 10 mg by mouth 4 (four) times daily as needed for anxiety (back spasms).  09/19/13  Yes Adeline Saralyn Pilar, MD  doxycycline (VIBRA-TABS) 100 MG tablet Take 1 tablet (100 mg total) by mouth 2 (two) times daily. 11/27/15  Yes Truman Hayward, MD  fluticasone [facility]) 50 MCG/ACT nasal spray Place 1 spray into the nose daily as needed for allergies.  07/04/14  Yes Historical Provider, MD  NEOMYCIN-POLYMYXIN-HYDROCORTISONE (CORTISPORIN) 1 % SOLN otic solution Place 2 drops in ear(s) daily. Reported on 11/12/2015 07/04/14  Yes Historical Provider, MD  pregabalin (LYRICA) 75 MG capsule Take 75 mg by mouth 2 (two) times daily.  05/07/13  Yes Historical Provider, MD  tamsulosin (FLOMAX) 0.4 MG CAPS capsule Take 0.4 mg by mouth.   Yes Historical Provider, MD    Scheduled Meds: . chlorhexidine gluconate (MEDLINE KIT)  15 mL Mouth Rinse BID  . Chlorhexidine Gluconate Cloth  6 each Topical Daily  . famotidine  20 mg Oral Daily  . IMMUNE GLOBULIN 10% (HUMAN) IV - For Fluid Restriction Only  400 mg/kg Intravenous Q24H  . mouth rinse  15 mL Mouth Rinse QID  . pantoprazole (PROTONIX) IV  40 mg Intravenous Daily  . polyvinyl alcohol  1 drop Both Eyes TID  . pregabalin  75 mg Per Tube BID  . senna-docusate  1 tablet Oral QHS  . sodium chloride flush  10-40 mL Intracatheter Q12H  . tamsulosin  0.4 mg Oral QPC  supper  . thiamine injection  100 mg Intravenous Daily  . vancomycin  1,000 mg Intravenous Q12H   Continuous Infusions: . sodium chloride 100 mL/hr at 06/11/16 6283  . feeding supplement (VITAL HIGH PROTEIN) 1,000 mL (06/11/16 0600)  . phenylephrine (NEO-SYNEPHRINE) Adult infusion Stopped (06/10/16 1300)  . propofol (DIPRIVAN) infusion 45 mcg/kg/min (06/11/16 0600)   PRN Meds:.acetaminophen **OR** acetaminophen, fentaNYL (SUBLIMAZE) injection, magnesium hydroxide, midazolam, midazolam, ondansetron **OR** ondansetron (ZOFRAN) IV, sodium chloride flush     Results for orders placed or performed during the hospital encounter of 06/03/16 (from the past 48 hour(s))  MRSA PCR Screening     Status: None   Collection Time: 06/09/16  5:00 PM  Result Value Ref Range   MRSA by PCR NEGATIVE NEGATIVE    Comment:        The GeneXpert MRSA Assay (FDA  approved for NASAL specimens only), is one component of a comprehensive MRSA colonization surveillance program. It is not intended to diagnose MRSA infection nor to guide or monitor treatment for MRSA infections.   Blood gas, arterial     Status: None   Collection Time: 06/09/16  6:10 PM  Result Value Ref Range   FIO2 60.00    Delivery systems VENTILATOR    Mode PRESSURE REGULATED VOLUME CONTROL    VT 650 mL   LHR 14.0 resp/min   Peep/cpap 5.0 cm H20   pH, Arterial 7.430 7.350 - 7.450   pCO2 arterial 35.9 32.0 - 48.0 mmHg   pO2, Arterial 92.6 83.0 - 108.0 mmHg   Bicarbonate 24.3 20.0 - 28.0 mmol/L   Acid-base deficit 0.4 0.0 - 2.0 mmol/L   O2 Saturation 96.4 %   Patient temperature 37.0    Collection site RIGHT RADIAL    Drawn by [id number]    Sample type ARTERIAL DRAW    Allens test (pass/fail) PASS PASS  Glucose, capillary     Status: None   Collection Time: 06/09/16  6:16 PM  Result Value Ref Range   Glucose-Capillary 94 65 - 99 mg/dL  Culture, blood (routine x 2)     Status: None (Preliminary result)   Collection Time: 06/09/16   7:15 PM  Result Value Ref Range   Specimen Description BLOOD RIGHT HAND    Special Requests BOTTLES DRAWN AEROBIC AND ANAEROBIC 6CC    Culture NO GROWTH 2 DAYS    Report Status PENDING   CBC     Status: Abnormal   Collection Time: 06/09/16  7:20 PM  Result Value Ref Range   WBC 9.9 4.0 - 10.5 K/uL   RBC 3.27 (L) 4.22 - 5.81 MIL/uL   Hemoglobin 10.7 (L) 13.0 - 17.0 g/dL   HCT 30.7 (L) 39.0 - 52.0 %   MCV 93.9 78.0 - 100.0 fL   MCH 32.7 26.0 - 34.0 pg   MCHC 34.9 30.0 - 36.0 g/dL   RDW 14.6 11.5 - 15.5 %   Platelets 391 150 - 400 K/uL  Basic metabolic panel     Status: Abnormal   Collection Time: 06/09/16  7:20 PM  Result Value Ref Range   Sodium 141 135 - 145 mmol/L   Potassium 4.6 3.5 - 5.1 mmol/L   Chloride 108 101 - 111 mmol/L   CO2 24 22 - 32 mmol/L   Glucose, Bld 97 65 - 99 mg/dL   BUN 41 (H) 6 - 20 mg/dL   Creatinine, Ser 1.51 (H) 0.61 - 1.24 mg/dL   Calcium 8.3 (L) 8.9 - 10.3 mg/dL   GFR calc non Af Amer 47 (L) >60 mL/min   GFR calc Af Amer 54 (L) >60 mL/min    Comment: (NOTE) The eGFR has been calculated using the CKD EPI equation. This calculation has not been validated in all clinical situations. eGFR's persistently <60 mL/min signify possible Chronic Kidney Disease.    Anion gap 9 5 - 15  Culture, blood (routine x 2)     Status: None (Preliminary result)   Collection Time: 06/09/16  7:20 PM  Result Value Ref Range   Specimen Description BLOOD LEFT HAND    Special Requests BOTTLES DRAWN AEROBIC AND ANAEROBIC 6CC    Culture NO GROWTH 2 DAYS    Report Status PENDING   Lactic acid, plasma     Status: Abnormal   Collection Time: 06/09/16  7:20 PM  Result Value Ref Range  Lactic Acid, Venous 2.3 (HH) 0.5 - 1.9 mmol/L    Comment: CRITICAL RESULT CALLED TO, READ BACK BY AND VERIFIED WITH: JESS,JA T 2050 ON 06/09/2016 BY ISLEY,B   Lactic acid, plasma     Status: None   Collection Time: 06/09/16 11:46 PM  Result Value Ref Range   Lactic Acid, Venous 1.0 0.5 - 1.9  mmol/L  Basic metabolic panel     Status: Abnormal   Collection Time: 06/10/16  3:28 AM  Result Value Ref Range   Sodium 140 135 - 145 mmol/L   Potassium 4.1 3.5 - 5.1 mmol/L   Chloride 112 (H) 101 - 111 mmol/L   CO2 22 22 - 32 mmol/L   Glucose, Bld 134 (H) 65 - 99 mg/dL   BUN 41 (H) 6 - 20 mg/dL   Creatinine, Ser 1.15 0.61 - 1.24 mg/dL   Calcium 7.9 (L) 8.9 - 10.3 mg/dL   GFR calc non Af Amer >60 >60 mL/min   GFR calc Af Amer >60 >60 mL/min    Comment: (NOTE) The eGFR has been calculated using the CKD EPI equation. This calculation has not been validated in all clinical situations. eGFR's persistently <60 mL/min signify possible Chronic Kidney Disease.    Anion gap 6 5 - 15  CBC     Status: Abnormal   Collection Time: 06/10/16  3:28 AM  Result Value Ref Range   WBC 7.7 4.0 - 10.5 K/uL   RBC 3.23 (L) 4.22 - 5.81 MIL/uL   Hemoglobin 10.5 (L) 13.0 - 17.0 g/dL   HCT 30.2 (L) 39.0 - 52.0 %   MCV 93.5 78.0 - 100.0 fL   MCH 32.5 26.0 - 34.0 pg   MCHC 34.8 30.0 - 36.0 g/dL   RDW 14.9 11.5 - 15.5 %   Platelets 355 150 - 400 K/uL  Ammonia     Status: None   Collection Time: 06/10/16  3:28 AM  Result Value Ref Range   Ammonia 18 9 - 35 umol/L  Triglycerides     Status: None   Collection Time: 06/10/16  3:28 AM  Result Value Ref Range   Triglycerides 128 <150 mg/dL  Cryptococcal antigen, CSF     Status: None   Collection Time: 06/10/16  8:00 AM  Result Value Ref Range   Crypto Ag NEGATIVE NEGATIVE   Cryptococcal Ag Titer NOT INDICATED NOT INDICATED    Comment: Performed at [facility], [address] [place], [place] [address]  Protein and glucose, CSF     Status: Abnormal   Collection Time: 06/10/16  8:00 AM  Result Value Ref Range   Glucose, CSF 62 40 - 70 mg/dL   Total  Protein, CSF 205 (H) 15 - 45 mg/dL    Comment: RESULTS CONFIRMED BY MANUAL DILUTION  CSF cell count with differential     Status: Abnormal   Collection Time: 06/10/16  8:00 AM  Result Value Ref  Range   Tube # 4    Color, CSF COLORLESS COLORLESS   Appearance, CSF CLEAR CLEAR   Supernatant CLEAR    RBC Count, CSF 1 (H) 0 /cu mm   WBC, CSF 9 (H) 0 - 5 /cu mm  CSF culture     Status: None (Preliminary result)   Collection Time: 06/10/16  8:00 AM  Result Value Ref Range   Specimen Description CSF    Special Requests Normal    Gram Stain      NO ORGANISMS SEEN NO WBC  SEEN CYTOSPIN SMEAR Gram Stain Report Called to,Read Back By and Verified With: WILLIE E. AT 0926A ON [id number] BY THOMPSON S.    Culture PENDING    Report Status PENDING   Glucose, capillary     Status: Abnormal   Collection Time: 06/10/16  8:28 AM  Result Value Ref Range   Glucose-Capillary 109 (H) 65 - 99 mg/dL   Comment 1 Notify RN    Comment 2 Document in Chart   Blood gas, arterial     Status: Abnormal   Collection Time: 06/10/16  8:50 AM  Result Value Ref Range   FIO2 0.50    Delivery systems VENTILATOR    Mode PRESSURE REGULATED VOLUME CONTROL    VT 650 mL   LHR 16 resp/min   Peep/cpap 5.0 cm H20   pH, Arterial 7.486 (H) 7.350 - 7.450   pCO2 arterial 26.2 (L) 32.0 - 48.0 mmHg   pO2, Arterial 182.0 (H) 83.0 - 108.0 mmHg   Bicarbonate 22.5 20.0 - 28.0 mmol/L   Acid-base deficit 3.4 (H) 0.0 - 2.0 mmol/L   O2 Saturation 99.0 %   Patient temperature 98.6    Collection site RIGHT RADIAL    Drawn by [id number]    Sample type ARTERIAL    Allens test (pass/fail) PASS PASS  Glucose, capillary     Status: Abnormal   Collection Time: 06/10/16 11:34 AM  Result Value Ref Range   Glucose-Capillary 108 (H) 65 - 99 mg/dL  Glucose, capillary     Status: None   Collection Time: 06/10/16  4:36 PM  Result Value Ref Range   Glucose-Capillary 71 65 - 99 mg/dL   Comment 1 Notify RN    Comment 2 Document in Chart   Glucose, capillary     Status: Abnormal   Collection Time: 06/11/16 12:11 AM  Result Value Ref Range   Glucose-Capillary 124 (H) 65 - 99 mg/dL   Comment 1 Notify RN   Blood gas, arterial     Status:  Abnormal   Collection Time: 06/11/16  3:55 AM  Result Value Ref Range   FIO2 40.00    Delivery systems VENTILATOR    Mode PRESSURE REGULATED VOLUME CONTROL    VT 650 mL   LHR 14 resp/min   Peep/cpap 5.0 cm H20   pH, Arterial 7.473 (H) 7.350 - 7.450   pCO2 arterial 28.9 (L) 32.0 - 48.0 mmHg   pO2, Arterial 74.8 (L) 83.0 - 108.0 mmHg   Bicarbonate 23.2 20.0 - 28.0 mmol/L   Acid-Base Excess 2.2 (H) 0.0 - 2.0 mmol/L   O2 Saturation 94.5 %   Collection site RIGHT RADIAL    Drawn by [id number]    Sample type ARTERIAL    Allens test (pass/fail) PASS PASS  Basic metabolic panel     Status: Abnormal   Collection Time: 06/11/16  4:06 AM  Result Value Ref Range   Sodium 140 135 - 145 mmol/L   Potassium 3.5 3.5 - 5.1 mmol/L   Chloride 114 (H) 101 - 111 mmol/L   CO2 22 22 - 32 mmol/L   Glucose, Bld 105 (H) 65 - 99 mg/dL   BUN 25 (H) 6 - 20 mg/dL   Creatinine, Ser 0.79 0.61 - 1.24 mg/dL   Calcium 7.6 (L) 8.9 - 10.3 mg/dL   GFR calc non Af Amer >60 >60 mL/min   GFR calc Af Amer >60 >60 mL/min    Comment: (NOTE) The eGFR has been calculated using the CKD EPI equation.  This calculation has not been validated in all clinical situations. eGFR's persistently <60 mL/min signify possible Chronic Kidney Disease.    Anion gap 4 (L) 5 - 15  CBC     Status: Abnormal   Collection Time: 06/11/16  4:06 AM  Result Value Ref Range   WBC 7.0 4.0 - 10.5 K/uL   RBC 3.23 (L) 4.22 - 5.81 MIL/uL   Hemoglobin 10.9 (L) 13.0 - 17.0 g/dL   HCT 30.4 (L) 39.0 - 52.0 %   MCV 94.1 78.0 - 100.0 fL   MCH 33.7 26.0 - 34.0 pg   MCHC 35.9 30.0 - 36.0 g/dL   RDW 15.5 11.5 - 15.5 %   Platelets 324 150 - 400 K/uL  Glucose, capillary     Status: Abnormal   Collection Time: 06/11/16  8:48 AM  Result Value Ref Range   Glucose-Capillary 105 (H) 65 - 99 mg/dL    Studies/Results:  CTA LUNG IMPRESSION: Manubrial fracture  No evidence of pulmonary emboli.  Left lower lobe infiltrate and effusion.     BRAIN  MRI FINDINGS: Brain: Cerebral volume is within normal limits for age. No restricted diffusion to suggest acute infarction. No midline shift, mass effect, evidence of mass lesion, ventriculomegaly, extra-axial collection or acute intracranial hemorrhage. Cervicomedullary junction and pituitary are within normal limits.  Pearline Cables and white matter signal is within normal limits for age throughout the brain. No cortical encephalomalacia or chronic cerebral blood products.  Vascular: Major intracranial vascular flow voids are preserved.  Skull and upper cervical spine: Negative. Normal bone marrow signal.  Sinuses/Orbits: Normal orbits soft tissues. Trace paranasal sinus mucosal thickening.  Other: Visible internal auditory structures appear normal. Mastoids are clear. Negative scalp soft tissues.  IMPRESSION: No acute intracranial abnormality. Normal for age noncontrast MRI appearance of the brain.            L SPINE MRI 2015 FINDINGS: Trace anterolisthesis of L3 on L4 and L4 on L5 is unchanged. There is grade 1 retrolisthesis of L1 on L2, stable to minimally increased from prior MRI. New from the prior MRI is a mild compression deformity involving the L2 superior endplate with approximately 10% vertebral body height loss. There is abnormal fluid signal within the L1-2 disc space, and there is also new edema throughout the L1 and L2 vertebral bodies. There is paraspinal inflammatory change/ phlegmon at this level, and there is a 1.9 x 1.3 cm fluid collection in the left psoas muscle at the L2 level (series 5, image 16).  Advanced disc space height loss at L5-S1 is unchanged, however there is increased fluid signal within the disc space compared to the prior MRI with mildly increased marrow edema within the adjacent L5 and S1 vertebral bodies. The conus medullaris is normal in signal and terminates at the superior aspect of L2.  T11-12: Only imaged sagittally.  Facet hypertrophy results in mild bilateral neural foraminal narrowing, right greater than left and unchanged.  T12-L1:  Negative.  L1-2: Sequelae of prior laminectomy and posterior fusion are again identified. Posterior retropulsion of the superior L2 endplate, greater on the right, with possibly small adjacent epidural fluid collection, results in new narrowing of the right lateral recess and increased right neural foraminal narrowing. No spinal canal stenosis.  L2-3: Mild disc bulge and facet hypertrophy result in mild right lateral recess and neural foraminal narrowing, unchanged.  L3-4: Mild disc bulge and moderate facet hypertrophy result in mild right neural foraminal narrowing, unchanged. No spinal canal stenosis.  L4-5: Left foraminal disc protrusion and mild-to-moderate facet hypertrophy result in mild to moderate left neural foraminal stenosis, unchanged. No spinal canal stenosis.  L5-S1: Disc bulge and mild facet hypertrophy result in mild to moderate bilateral neural foraminal stenosis, unchanged. No spinal canal stenosis.  IMPRESSION: 1. Changes at L1-2 as above concerning for discitis/osteomyelitis with small left psoas abscess. New, mild compression deformity of the L2 superior endplate and mild retropulsion results in new right lateral recess narrowing and increased right neural foraminal narrowing at L1-2. Small ventral epidural fluid collection is not excluded on the right. 2. Advanced degenerative disc disease at L5-S1 with a small amount of new fluid signal in the disc space and mildly increased adjacent marrow changes. Early discitis/osteomyelitis is not excluded.       Taequan Stockhausen A. Marc Rose, M.D.  Diplomate, [employer] adviser of Psychiatry and Neurology ( Neurology). 06/11/2016, 8:57 AM

## 2016-06-11 NOTE — Clinical Social Work Note (Addendum)
Per Volney Presser, pt's sister and brother-in-law will be coming from [place], hopefully sometime this weekend. Volney Presser provided contact information for [place], pt's sister and this was put on chart.   Marc Rose, [place]

## 2016-06-11 NOTE — Clinical Social Work Placement (Addendum)
CSW spoke with Volney Presser and discussed recommendation for SNF as pt requested several days ago. Chart updated with correct contact information. Attempted to reach her again this morning to ask about pt's family, but no answer.   CLINICAL SOCIAL WORK PLACEMENT  NOTE  Date:  06/11/2016  Patient Details  Name: Marc Rose MRN: [id number] Date of Birth: 1950/06/28  Clinical Social Work is seeking post-discharge placement for this patient at the [place] level of care (*CSW will initial, date and re-position this form in  chart as items are completed):  Yes   Patient/family provided with [facility] Work Department's list of facilities offering this level of care within the geographic area requested by the patient (or if unable, by the patient's family).  Yes   Patient/family informed of their freedom to choose among providers that offer the needed level of care, that participate in Medicare, Medicaid or managed care program needed by the patient, have an available bed and are willing to accept the patient.  Yes   Patient/family informed of [facility]'s ownership interest in [facility] and [facility], as well as of the fact that they are under no obligation to receive care at these facilities.  PASRR submitted to EDS on       PASRR number received on       Existing PASRR number confirmed on 06/07/16     FL2 transmitted to all facilities in geographic area requested by pt/family on 06/07/16     FL2 transmitted to all facilities within larger geographic area on       Patient informed that his/her managed care company has contracts with or will negotiate with certain facilities, including the following:        Yes   Patient/family informed of bed offers received.  Patient chooses bed at [facility]     Physician recommends and patient chooses bed at      Patient to be transferred to [facility] on  .  Patient to be transferred to  facility by       Patient family notified on   of transfer.  Name of family member notified:        PHYSICIAN       Additional Comment:    _______________________________________________ Salome Arnt, [place] 06/11/2016, 8:09 AM [phone]

## 2016-06-11 NOTE — Progress Notes (Signed)
Decreased urine output noted to urinary cath. Bladder scan revealed 827 ml residual. Attempt to flush foley cath by RN x 2 unsuccessful. 14 fr foley catheter removed intact without difficulty. 16 fr foley inserted using sterile technique, pt tolerated well. 800 ml green urine initial output.

## 2016-06-11 NOTE — Progress Notes (Signed)
Subjective: He remains intubated and on the ventilator. He is off pressors. Tube feeding is underway. He had lumbar puncture done yesterday and results show mildly elevated white blood cell count and elevated protein. Culture pending no new problems noted by nursing staff overnight  Objective: Vital signs in last 24 hours: Temp:  [94.4 F (34.7 C)-99.1 F (37.3 C)] 99.1 F (37.3 C) (02/09 0400) Pulse Rate:  [40-98] 93 (02/09 0545) Resp:  [14-19] 14 (02/09 0545) BP: (81-161)/(56-123) 126/84 (02/09 0545) SpO2:  [100 %] 100 % (02/09 0545) FiO2 (%):  [40 %-50 %] 40 % (02/09 0251) Weight:  [74.3 kg (163 lb 12.8 oz)] 74.3 kg (163 lb 12.8 oz) (02/09 0500) Weight change: -2.7 kg (-5 lb 15.2 oz) Last BM Date: 06/08/16  Intake/Output from previous day: 02/08 0701 - 02/09 0700 In: 2957.5 [I.V.:2309.5; NG/GT:248; IV Piggyback:400] Out: 1200 [Urine:1150; Emesis/NG output:50]  PHYSICAL EXAM General appearance: Intubated sedated on mechanical ventilation. Resp: clear to auscultation bilaterally Cardio: regular rate and rhythm, S1, S2 normal, no murmur, click, rub or gallop GI: soft, non-tender; bowel sounds normal; no masses,  no organomegaly Extremities: extremities normal, atraumatic, no cyanosis or edema Skin warm and dry.  Lab Results:  Results for orders placed or performed during the hospital encounter of 06/03/16 (from the past 48 hour(s))  MRSA PCR Screening     Status: None   Collection Time: 06/09/16  5:00 PM  Result Value Ref Range   MRSA by PCR NEGATIVE NEGATIVE    Comment:        The GeneXpert MRSA Assay (FDA approved for NASAL specimens only), is one component of a comprehensive MRSA colonization surveillance program. It is not intended to diagnose MRSA infection nor to guide or monitor treatment for MRSA infections.   Blood gas, arterial     Status: None   Collection Time: 06/09/16  6:10 PM  Result Value Ref Range   FIO2 60.00    Delivery systems VENTILATOR     Mode PRESSURE REGULATED VOLUME CONTROL    VT 650 mL   LHR 14.0 resp/min   Peep/cpap 5.0 cm H20   pH, Arterial 7.430 7.350 - 7.450   pCO2 arterial 35.9 32.0 - 48.0 mmHg   pO2, Arterial 92.6 83.0 - 108.0 mmHg   Bicarbonate 24.3 20.0 - 28.0 mmol/L   Acid-base deficit 0.4 0.0 - 2.0 mmol/L   O2 Saturation 96.4 %   Patient temperature 37.0    Collection site RIGHT RADIAL    Drawn by [id number]    Sample type ARTERIAL DRAW    Allens test (pass/fail) PASS PASS  Glucose, capillary     Status: None   Collection Time: 06/09/16  6:16 PM  Result Value Ref Range   Glucose-Capillary 94 65 - 99 mg/dL  Culture, blood (routine x 2)     Status: None (Preliminary result)   Collection Time: 06/09/16  7:15 PM  Result Value Ref Range   Specimen Description BLOOD RIGHT HAND    Special Requests BOTTLES DRAWN AEROBIC AND ANAEROBIC 6CC    Culture NO GROWTH < 12 HOURS    Report Status PENDING   CBC     Status: Abnormal   Collection Time: 06/09/16  7:20 PM  Result Value Ref Range   WBC 9.9 4.0 - 10.5 K/uL   RBC 3.27 (L) 4.22 - 5.81 MIL/uL   Hemoglobin 10.7 (L) 13.0 - 17.0 g/dL   HCT 30.7 (L) 39.0 - 52.0 %   MCV 93.9 78.0 - 100.0 fL  MCH 32.7 26.0 - 34.0 pg   MCHC 34.9 30.0 - 36.0 g/dL   RDW 14.6 11.5 - 15.5 %   Platelets 391 150 - 400 K/uL  Basic metabolic panel     Status: Abnormal   Collection Time: 06/09/16  7:20 PM  Result Value Ref Range   Sodium 141 135 - 145 mmol/L   Potassium 4.6 3.5 - 5.1 mmol/L   Chloride 108 101 - 111 mmol/L   CO2 24 22 - 32 mmol/L   Glucose, Bld 97 65 - 99 mg/dL   BUN 41 (H) 6 - 20 mg/dL   Creatinine, Ser 1.51 (H) 0.61 - 1.24 mg/dL   Calcium 8.3 (L) 8.9 - 10.3 mg/dL   GFR calc non Af Amer 47 (L) >60 mL/min   GFR calc Af Amer 54 (L) >60 mL/min    Comment: (NOTE) The eGFR has been calculated using the CKD EPI equation. This calculation has not been validated in all clinical situations. eGFR's persistently <60 mL/min signify possible Chronic Kidney Disease.    Anion  gap 9 5 - 15  Culture, blood (routine x 2)     Status: None (Preliminary result)   Collection Time: 06/09/16  7:20 PM  Result Value Ref Range   Specimen Description BLOOD LEFT HAND    Special Requests BOTTLES DRAWN AEROBIC AND ANAEROBIC 6CC    Culture NO GROWTH < 12 HOURS    Report Status PENDING   Lactic acid, plasma     Status: Abnormal   Collection Time: 06/09/16  7:20 PM  Result Value Ref Range   Lactic Acid, Venous 2.3 (HH) 0.5 - 1.9 mmol/L    Comment: CRITICAL RESULT CALLED TO, READ BACK BY AND VERIFIED WITH: JESS,JA T 2050 ON 06/09/2016 BY ISLEY,B   Lactic acid, plasma     Status: None   Collection Time: 06/09/16 11:46 PM  Result Value Ref Range   Lactic Acid, Venous 1.0 0.5 - 1.9 mmol/L  Basic metabolic panel     Status: Abnormal   Collection Time: 06/10/16  3:28 AM  Result Value Ref Range   Sodium 140 135 - 145 mmol/L   Potassium 4.1 3.5 - 5.1 mmol/L   Chloride 112 (H) 101 - 111 mmol/L   CO2 22 22 - 32 mmol/L   Glucose, Bld 134 (H) 65 - 99 mg/dL   BUN 41 (H) 6 - 20 mg/dL   Creatinine, Ser 1.15 0.61 - 1.24 mg/dL   Calcium 7.9 (L) 8.9 - 10.3 mg/dL   GFR calc non Af Amer >60 >60 mL/min   GFR calc Af Amer >60 >60 mL/min    Comment: (NOTE) The eGFR has been calculated using the CKD EPI equation. This calculation has not been validated in all clinical situations. eGFR's persistently <60 mL/min signify possible Chronic Kidney Disease.    Anion gap 6 5 - 15  CBC     Status: Abnormal   Collection Time: 06/10/16  3:28 AM  Result Value Ref Range   WBC 7.7 4.0 - 10.5 K/uL   RBC 3.23 (L) 4.22 - 5.81 MIL/uL   Hemoglobin 10.5 (L) 13.0 - 17.0 g/dL   HCT 30.2 (L) 39.0 - 52.0 %   MCV 93.5 78.0 - 100.0 fL   MCH 32.5 26.0 - 34.0 pg   MCHC 34.8 30.0 - 36.0 g/dL   RDW 14.9 11.5 - 15.5 %   Platelets 355 150 - 400 K/uL  Ammonia     Status: None   Collection Time: 06/10/16  3:28 AM  Result Value Ref Range   Ammonia 18 9 - 35 umol/L  Triglycerides     Status: None   Collection  Time: 06/10/16  3:28 AM  Result Value Ref Range   Triglycerides 128 <150 mg/dL  Cryptococcal antigen, CSF     Status: None   Collection Time: 06/10/16  8:00 AM  Result Value Ref Range   Crypto Ag NEGATIVE NEGATIVE   Cryptococcal Ag Titer NOT INDICATED NOT INDICATED    Comment: Performed at [facility] [facility], [place] [address] [place], [place] [address]  Protein and glucose, CSF     Status: Abnormal   Collection Time: 06/10/16  8:00 AM  Result Value Ref Range   Glucose, CSF 62 40 - 70 mg/dL   Total  Protein, CSF 205 (H) 15 - 45 mg/dL    Comment: RESULTS CONFIRMED BY MANUAL DILUTION  CSF cell count with differential     Status: Abnormal   Collection Time: 06/10/16  8:00 AM  Result Value Ref Range   Tube # 4    Color, CSF COLORLESS COLORLESS   Appearance, CSF CLEAR CLEAR   Supernatant CLEAR    RBC Count, CSF 1 (H) 0 /cu mm   WBC, CSF 9 (H) 0 - 5 /cu mm  CSF culture     Status: None (Preliminary result)   Collection Time: 06/10/16  8:00 AM  Result Value Ref Range   Specimen Description CSF    Special Requests Normal    Gram Stain      NO ORGANISMS SEEN NO WBC SEEN CYTOSPIN SMEAR Gram Stain Report Called to,Read Back By and Verified With: WILLIE E. AT 0926A ON [id number] BY THOMPSON S.    Culture PENDING    Report Status PENDING   Glucose, capillary     Status: Abnormal   Collection Time: 06/10/16  8:28 AM  Result Value Ref Range   Glucose-Capillary 109 (H) 65 - 99 mg/dL   Comment 1 Notify RN    Comment 2 Document in Chart   Blood gas, arterial     Status: Abnormal   Collection Time: 06/10/16  8:50 AM  Result Value Ref Range   FIO2 0.50    Delivery systems VENTILATOR    Mode PRESSURE REGULATED VOLUME CONTROL    VT 650 mL   LHR 16 resp/min   Peep/cpap 5.0 cm H20   pH, Arterial 7.486 (H) 7.350 - 7.450   pCO2 arterial 26.2 (L) 32.0 - 48.0 mmHg   pO2, Arterial 182.0 (H) 83.0 - 108.0 mmHg   Bicarbonate 22.5 20.0 - 28.0 mmol/L   Acid-base deficit 3.4 (H) 0.0 - 2.0 mmol/L   O2  Saturation 99.0 %   Patient temperature 98.6    Collection site RIGHT RADIAL    Drawn by [id number]    Sample type ARTERIAL    Allens test (pass/fail) PASS PASS  Glucose, capillary     Status: Abnormal   Collection Time: 06/10/16 11:34 AM  Result Value Ref Range   Glucose-Capillary 108 (H) 65 - 99 mg/dL  Glucose, capillary     Status: None   Collection Time: 06/10/16  4:36 PM  Result Value Ref Range   Glucose-Capillary 71 65 - 99 mg/dL   Comment 1 Notify RN    Comment 2 Document in Chart   Glucose, capillary     Status: Abnormal   Collection Time: 06/11/16 12:11 AM  Result Value Ref Range   Glucose-Capillary 124 (H) 65 - 99 mg/dL  Comment 1 Notify RN   Blood gas, arterial     Status: Abnormal   Collection Time: 06/11/16  3:55 AM  Result Value Ref Range   FIO2 40.00    Delivery systems VENTILATOR    Mode PRESSURE REGULATED VOLUME CONTROL    VT 650 mL   LHR 14 resp/min   Peep/cpap 5.0 cm H20   pH, Arterial 7.473 (H) 7.350 - 7.450   pCO2 arterial 28.9 (L) 32.0 - 48.0 mmHg   pO2, Arterial 74.8 (L) 83.0 - 108.0 mmHg   Bicarbonate 23.2 20.0 - 28.0 mmol/L   Acid-Base Excess 2.2 (H) 0.0 - 2.0 mmol/L   O2 Saturation 94.5 %   Collection site RIGHT RADIAL    Drawn by [id number]    Sample type ARTERIAL    Allens test (pass/fail) PASS PASS  Basic metabolic panel     Status: Abnormal   Collection Time: 06/11/16  4:06 AM  Result Value Ref Range   Sodium 140 135 - 145 mmol/L   Potassium 3.5 3.5 - 5.1 mmol/L   Chloride 114 (H) 101 - 111 mmol/L   CO2 22 22 - 32 mmol/L   Glucose, Bld 105 (H) 65 - 99 mg/dL   BUN 25 (H) 6 - 20 mg/dL   Creatinine, Ser 0.79 0.61 - 1.24 mg/dL   Calcium 7.6 (L) 8.9 - 10.3 mg/dL   GFR calc non Af Amer >60 >60 mL/min   GFR calc Af Amer >60 >60 mL/min    Comment: (NOTE) The eGFR has been calculated using the CKD EPI equation. This calculation has not been validated in all clinical situations. eGFR's persistently <60 mL/min signify possible Chronic  Kidney Disease.    Anion gap 4 (L) 5 - 15  CBC     Status: Abnormal   Collection Time: 06/11/16  4:06 AM  Result Value Ref Range   WBC 7.0 4.0 - 10.5 K/uL   RBC 3.23 (L) 4.22 - 5.81 MIL/uL   Hemoglobin 10.9 (L) 13.0 - 17.0 g/dL   HCT 30.4 (L) 39.0 - 52.0 %   MCV 94.1 78.0 - 100.0 fL   MCH 33.7 26.0 - 34.0 pg   MCHC 35.9 30.0 - 36.0 g/dL   RDW 15.5 11.5 - 15.5 %   Platelets 324 150 - 400 K/uL    ABGS  Recent Labs  06/11/16 0355  PHART 7.473*  PO2ART 74.8*  HCO3 23.2   CULTURES Recent Results (from the past 240 hour(s))  Urine culture     Status: Abnormal   Collection Time: 06/03/16 12:59 PM  Result Value Ref Range Status   Specimen Description URINE, CLEAN CATCH  Final   Special Requests NONE  Final   Culture >=100,000 COLONIES/mL ENTEROCOCCUS FAECALIS (A)  Final   Report Status 06/06/2016 FINAL  Final   Organism ID, Bacteria ENTEROCOCCUS FAECALIS (A)  Final      Susceptibility   Enterococcus faecalis - MIC*    AMPICILLIN <=2 SENSITIVE Sensitive     LEVOFLOXACIN 1 SENSITIVE Sensitive     NITROFURANTOIN <=16 SENSITIVE Sensitive     VANCOMYCIN 1 SENSITIVE Sensitive     * >=100,000 COLONIES/mL ENTEROCOCCUS FAECALIS  MRSA PCR Screening     Status: None   Collection Time: 06/09/16  5:00 PM  Result Value Ref Range Status   MRSA by PCR NEGATIVE NEGATIVE Final    Comment:        The GeneXpert MRSA Assay (FDA approved for NASAL specimens only), is one component of a  comprehensive MRSA colonization surveillance program. It is not intended to diagnose MRSA infection nor to guide or monitor treatment for MRSA infections.   Culture, blood (routine x 2)     Status: None (Preliminary result)   Collection Time: 06/09/16  7:15 PM  Result Value Ref Range Status   Specimen Description BLOOD RIGHT HAND  Final   Special Requests BOTTLES DRAWN AEROBIC AND ANAEROBIC 6CC  Final   Culture NO GROWTH < 12 HOURS  Final   Report Status PENDING  Incomplete  Culture, blood (routine  x 2)     Status: None (Preliminary result)   Collection Time: 06/09/16  7:20 PM  Result Value Ref Range Status   Specimen Description BLOOD LEFT HAND  Final   Special Requests BOTTLES DRAWN AEROBIC AND ANAEROBIC 6CC  Final   Culture NO GROWTH < 12 HOURS  Final   Report Status PENDING  Incomplete  CSF culture     Status: None (Preliminary result)   Collection Time: 06/10/16  8:00 AM  Result Value Ref Range Status   Specimen Description CSF  Final   Special Requests Normal  Final   Gram Stain   Final    NO ORGANISMS SEEN NO WBC SEEN CYTOSPIN SMEAR Gram Stain Report Called to,Read Back By and Verified With: WILLIE E. AT 0926A ON [id number] BY THOMPSON S.    Culture PENDING  Incomplete   Report Status PENDING  Incomplete   Studies/Results: Dg Chest Port 1 View  Result Date: 06/10/2016 CLINICAL DATA:  Respiratory failure EXAM: PORTABLE CHEST 1 VIEW COMPARISON:  06/09/2016 FINDINGS: Endotracheal tube and nasogastric catheter are again seen and stable. The nasogastric catheter shows the proximal side port to lie within the distal esophagus and could be advanced slightly. Right-sided PICC line has been placed in the interval in satisfactory position. The lungs are well-aerated without focal infiltrate or sizable effusion. Postsurgical changes in the thoracolumbar spine are again seen. IMPRESSION: Interval PICC line placement in satisfactory position. Nasogastric catheter as described which could be advanced as necessary. No other focal abnormality is seen. Electronically Signed   By: Inez Catalina M.D.   On: 06/10/2016 09:23   Portable Chest Xray  Result Date: 06/09/2016 CLINICAL DATA:  Respiratory failure. EXAM: PORTABLE CHEST 1 VIEW COMPARISON:  Radiographs of June 04, 2016. FINDINGS: The heart size and mediastinal contours are within normal limits. Both lungs are clear. No pneumothorax or pleural effusion is noted. Endotracheal tube is seen projected over tracheal air shadow with distal tip 4 cm  above the carina. Nasogastric tube tip is seen in proximal stomach. The visualized skeletal structures are unremarkable. IMPRESSION: Endotracheal tube in grossly good position. Nasogastric tube tip seen in proximal stomach. No acute cardiopulmonary abnormality seen. Electronically Signed   By: Marijo Conception, M.D.   On: 06/09/2016 18:07    Medications:  Prior to Admission:  Prescriptions Prior to Admission  Medication Sig Dispense Refill Last Dose  . aspirin EC 81 MG tablet Take 81 mg by mouth daily.   Past Week at Unknown time  . benazepril (LOTENSIN) 40 MG tablet Take 40 mg by mouth daily.   Past Week at Unknown time  . cyclobenzaprine (FLEXERIL) 10 MG tablet Take 1 tablet (10 mg total) by mouth 3 (three) times daily as needed for muscle spasms. 30 tablet 0 Past Week at Unknown time  . diazepam (VALIUM) 10 MG tablet Take 0.5 tablets (5 mg total) by mouth every 6 (six) hours as needed for anxiety (back  spasms). (Patient taking differently: Take 10 mg by mouth 4 (four) times daily as needed for anxiety (back spasms). ) 30 tablet 0 Past Week at Unknown time  . doxycycline (VIBRA-TABS) 100 MG tablet Take 1 tablet (100 mg total) by mouth 2 (two) times daily. 60 tablet 11 Past Week at Unknown time  . fluticasone (FLONASE) 50 MCG/ACT nasal spray Place 1 spray into the nose daily as needed for allergies.    unknown  . NEOMYCIN-POLYMYXIN-HYDROCORTISONE (CORTISPORIN) 1 % SOLN otic solution Place 2 drops in ear(s) daily. Reported on 11/12/2015   Past Week at Unknown time  . pregabalin (LYRICA) 75 MG capsule Take 75 mg by mouth 2 (two) times daily.    Past Week at Unknown time  . tamsulosin (FLOMAX) 0.4 MG CAPS capsule Take 0.4 mg by mouth.   Past Week at Unknown time   Scheduled: . chlorhexidine gluconate (MEDLINE KIT)  15 mL Mouth Rinse BID  . Chlorhexidine Gluconate Cloth  6 each Topical Daily  . famotidine  20 mg Oral Daily  . IMMUNE GLOBULIN 10% (HUMAN) IV - For Fluid Restriction Only  400 mg/kg  Intravenous Q24H  . mouth rinse  15 mL Mouth Rinse QID  . pantoprazole (PROTONIX) IV  40 mg Intravenous Daily  . polyvinyl alcohol  1 drop Both Eyes TID  . pregabalin  75 mg Per Tube BID  . senna-docusate  1 tablet Oral QHS  . sodium chloride flush  10-40 mL Intracatheter Q12H  . tamsulosin  0.4 mg Oral QPC supper  . thiamine injection  100 mg Intravenous Daily  . vancomycin  1,000 mg Intravenous Q12H   Continuous: . sodium chloride 100 mL/hr at 06/11/16 9326  . feeding supplement (VITAL HIGH PROTEIN) 1,000 mL (06/11/16 0600)  . phenylephrine (NEO-SYNEPHRINE) Adult infusion Stopped (06/10/16 1300)  . 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 He became acutely encephalopathic and ended up being intubated and placed on mechanical ventilation. He had lumbar puncture done yesterday with results as noted. He is now on 40% oxygen. He is off pressors. Principal Problem:   UTI (urinary tract infection) due to Enterococcus Active Problems:   HTN (hypertension)   Abdominal pain, generalized   Long term current use of antibiotics   Back pain, chronic   Discitis of lumbar region   Nerve disease, peripheral (HCC)   Neurogenic urinary bladder disorder   AKI (acute kidney injury) ([place])   Ataxia   Muscle weakness (generalized)   Ataxic gait   Tachycardia   Miller-Fisher variant Guillain-Barre syndrome (HCC)   Acute respiratory failure (HCC)   Hypovolemic shock (HCC)    Malnutrition of moderate degree    Plan: We can attempt weaning today successful of course depend on whether he is still severely encephalopathic and if he has respiratory muscle compromise    LOS: 8 days   Mira Balon L 06/11/2016, 7:18 AM

## 2016-06-12 ENCOUNTER — Inpatient Hospital Stay (HOSPITAL_COMMUNITY): Payer: PPO

## 2016-06-12 DIAGNOSIS — R260 Ataxic gait: Secondary | ICD-10-CM

## 2016-06-12 LAB — GLUCOSE, CAPILLARY
GLUCOSE-CAPILLARY: 107 mg/dL — AB (ref 65–99)
Glucose-Capillary: 111 mg/dL — ABNORMAL HIGH (ref 65–99)
Glucose-Capillary: 75 mg/dL (ref 65–99)
Glucose-Capillary: 90 mg/dL (ref 65–99)
Glucose-Capillary: 95 mg/dL (ref 65–99)
Glucose-Capillary: 99 mg/dL (ref 65–99)

## 2016-06-12 LAB — CBC
HEMATOCRIT: 31.9 % — AB (ref 39.0–52.0)
Hemoglobin: 10.9 g/dL — ABNORMAL LOW (ref 13.0–17.0)
MCH: 32.3 pg (ref 26.0–34.0)
MCHC: 34.2 g/dL (ref 30.0–36.0)
MCV: 94.7 fL (ref 78.0–100.0)
PLATELETS: 331 10*3/uL (ref 150–400)
RBC: 3.37 MIL/uL — ABNORMAL LOW (ref 4.22–5.81)
RDW: 16.1 % — AB (ref 11.5–15.5)
WBC: 6.2 10*3/uL (ref 4.0–10.5)

## 2016-06-12 LAB — BASIC METABOLIC PANEL
Anion gap: 4 — ABNORMAL LOW (ref 5–15)
BUN: 18 mg/dL (ref 6–20)
CALCIUM: 7.9 mg/dL — AB (ref 8.9–10.3)
CO2: 25 mmol/L (ref 22–32)
CREATININE: 0.55 mg/dL — AB (ref 0.61–1.24)
Chloride: 110 mmol/L (ref 101–111)
GFR calc Af Amer: >60 mL/min (ref >60)
GLUCOSE: 132 mg/dL — AB (ref 65–99)
POTASSIUM: 3.6 mmol/L (ref 3.5–5.1)
SODIUM: 139 mmol/L (ref 135–145)

## 2016-06-12 LAB — BLOOD GAS, ARTERIAL
ACID-BASE EXCESS: 0.9 mmol/L (ref 0.0–2.0)
BICARBONATE: 23.8 mmol/L (ref 20.0–28.0)
DRAWN BY: 22223
FIO2: 30
LHR: 14 {breaths}/min
MECHVT: 550 mL
O2 SAT: 96.7 %
PCO2 ART: 36.6 mmHg (ref 32.0–48.0)
PEEP/CPAP: 5 cmH2O
PH ART: 7.417 (ref 7.350–7.450)
PO2 ART: 93.7 mmHg (ref 83.0–108.0)

## 2016-06-12 MED ORDER — HALOPERIDOL LACTATE 5 MG/ML IJ SOLN
5.0000 mg | Freq: Four times a day (QID) | INTRAMUSCULAR | Status: DC | PRN
  Administered 2016-06-18: 5 mg via INTRAVENOUS
  Filled 2016-06-12: qty 1

## 2016-06-12 NOTE — Progress Notes (Signed)
Spoke with patients sister Marlowe Kays in [place] area. She asked to be called with any updates or changes.  Connie-Sister [phone] Steve/Tracy Brother-in-law/Sister in [place] [phone]

## 2016-06-12 NOTE — Progress Notes (Signed)
Respiratory Care Note: Attempted to wean patient while at bedside and RN also at bedside. Wean patient on PS/CPAP 15/5 from about 1229 until about 1300. The patient's RR was only about 8-12 breaths min. RR started to decreased 6-8 breaths per min and HR started to increased into the 100's. Changed the patient back to rest on full support mode. The patient's breath sounds have been diminished and clear. Suction only very small amounts of frothy white secretions. Will continue to monitor.

## 2016-06-12 NOTE — Progress Notes (Signed)
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 Electronically Signed   By: Inez Catalina M.D.   On: 06/10/2016 09:23   Portable Chest  Xray  Result Date: 06/09/2016 CLINICAL DATA:  Respiratory failure. EXAM: PORTABLE CHEST 1 VIEW COMPARISON:  Radiographs of June 04, 2016. FINDINGS: The heart size and mediastinal contours are within normal limits. Both lungs are clear. No pneumothorax or pleural effusion is noted. Endotracheal tube is seen projected over tracheal air shadow with distal tip 4 cm above the carina. Nasogastric tube tip is seen in proximal stomach. The visualized skeletal structures are unremarkable. IMPRESSION: Endotracheal tube in grossly good position. Nasogastric tube tip seen in proximal stomach. No acute cardiopulmonary abnormality seen. Electronically Signed   By: Marijo Conception, M.D.   On: 06/09/2016 18:07   Dg Foot Complete Left  Result Date: 06/03/2016 CLINICAL DATA:  Increased weakness x 2 weeks. Has hard time walking, multiple falls. Pt says that he has been crawling around the house the last few days due to inability to walk. Pain and  bruising lt foot. Pt held for imaging. Hx of surgery on the left knee and ankle. He has had problems since the surgery. EXAM: LEFT FOOT - COMPLETE 3+ VIEW COMPARISON:  None. FINDINGS: No fracture.  No bone lesion. The joints are normally aligned. Small amount of calcification at the base of the plantar fascia adjacent to the calcaneal tuberosity. Soft tissues are otherwise unremarkable. IMPRESSION: No fracture, bone lesion or significant joint abnormality. No acute finding. Electronically Signed   By: Lajean Manes M.D.   On: 06/03/2016 11:24    Time Spent in minutes  Critical care: 9mns   Braidan Ricciardi M.D on 06/12/2016 at 3:46 PM  Between 7am to 7pm - Pager - [phone] After 7pm go to [email or web address] - password [email or web address] -  Office  [phone]

## 2016-06-12 NOTE — Progress Notes (Signed)
Subjective: He failed weaning yesterday which is not surprising considering his overall situation. He is now on 30% oxygen. His lungs are clear and his chest x-ray which I personally reviewed does not show aspiration pneumonia but does show atelectasis in the bases. He has not had fever or elevated white blood cell count. He became very agitated when we try to wean him yesterday  Objective: Vital signs in last 24 hours: Temp:  [97.5 F (36.4 C)-98.7 F (37.1 C)] 98.7 F (37.1 C) (02/10 0700) Pulse Rate:  [85-105] 98 (02/10 0908) Resp:  [14-22] 14 (02/10 0908) BP: (80-162)/(55-107) 146/94 (02/10 0700) SpO2:  [99 %-100 %] 100 % (02/10 0908) FiO2 (%):  [30 %-40 %] 30 % (02/10 0908) Weight:  [76.5 kg (168 lb 10.4 oz)] 76.5 kg (168 lb 10.4 oz) (02/10 0400) Weight change: 2.2 kg (4 lb 13.6 oz) Last BM Date: 06/08/16  Intake/Output from previous day: 02/09 0701 - 02/10 0700 In: 3397 [I.V.:2197; NG/GT:1200] Out: 2200 [Urine:1200; Emesis/NG output:1000]  PHYSICAL EXAM General appearance: Intubated sedated on mechanical ventilation Resp: clear to auscultation bilaterally Cardio: regular rate and rhythm, S1, S2 normal, no murmur, click, rub or gallop GI: soft, non-tender; bowel sounds normal; no masses,  no organomegaly Extremities: He has multiple cuts and scrapes on his legs Skin warm and dry. Mucous membranes are moist  Lab Results:  Results for orders placed or performed during the hospital encounter of 06/03/16 (from the past 48 hour(s))  Glucose, capillary     Status: Abnormal   Collection Time: 06/10/16 11:34 AM  Result Value Ref Range   Glucose-Capillary 108 (H) 65 - 99 mg/dL  Glucose, capillary     Status: None   Collection Time: 06/10/16  4:36 PM  Result Value Ref Range   Glucose-Capillary 71 65 - 99 mg/dL   Comment 1 Notify RN    Comment 2 Document in Chart   Glucose, capillary     Status: Abnormal   Collection Time: 06/11/16 12:11 AM  Result Value Ref Range   Glucose-Capillary 124 (H) 65 - 99 mg/dL   Comment 1 Notify RN   Blood gas, arterial     Status: Abnormal   Collection Time: 06/11/16  3:55 AM  Result Value Ref Range   FIO2 40.00    Delivery systems VENTILATOR    Mode PRESSURE REGULATED VOLUME CONTROL    VT 650 mL   LHR 14 resp/min   Peep/cpap 5.0 cm H20   pH, Arterial 7.473 (H) 7.350 - 7.450   pCO2 arterial 28.9 (L) 32.0 - 48.0 mmHg   pO2, Arterial 74.8 (L) 83.0 - 108.0 mmHg   Bicarbonate 23.2 20.0 - 28.0 mmol/L   Acid-Base Excess 2.2 (H) 0.0 - 2.0 mmol/L   O2 Saturation 94.5 %   Collection site RIGHT RADIAL    Drawn by [id number]    Sample type ARTERIAL    Allens test (pass/fail) PASS PASS  Basic metabolic panel     Status: Abnormal   Collection Time: 06/11/16  4:06 AM  Result Value Ref Range   Sodium 140 135 - 145 mmol/L   Potassium 3.5 3.5 - 5.1 mmol/L   Chloride 114 (H) 101 - 111 mmol/L   CO2 22 22 - 32 mmol/L   Glucose, Bld 105 (H) 65 - 99 mg/dL   BUN 25 (H) 6 - 20 mg/dL   Creatinine, Ser 0.79 0.61 - 1.24 mg/dL   Calcium 7.6 (L) 8.9 - 10.3 mg/dL   GFR calc non Af Amer >  60 >60 mL/min   GFR calc Af Amer >60 >60 mL/min    Comment: (NOTE) The eGFR has been calculated using the CKD EPI equation. This calculation has not been validated in all clinical situations. eGFR's persistently <60 mL/min signify possible Chronic Kidney Disease.    Anion gap 4 (L) 5 - 15  CBC     Status: Abnormal   Collection Time: 06/11/16  4:06 AM  Result Value Ref Range   WBC 7.0 4.0 - 10.5 K/uL   RBC 3.23 (L) 4.22 - 5.81 MIL/uL   Hemoglobin 10.9 (L) 13.0 - 17.0 g/dL   HCT 30.4 (L) 39.0 - 52.0 %   MCV 94.1 78.0 - 100.0 fL   MCH 33.7 26.0 - 34.0 pg   MCHC 35.9 30.0 - 36.0 g/dL   RDW 15.5 11.5 - 15.5 %   Platelets 324 150 - 400 K/uL  Glucose, capillary     Status: Abnormal   Collection Time: 06/11/16  8:48 AM  Result Value Ref Range   Glucose-Capillary 105 (H) 65 - 99 mg/dL  Glucose, capillary     Status: Abnormal   Collection Time: 06/11/16   1:28 PM  Result Value Ref Range   Glucose-Capillary 107 (H) 65 - 99 mg/dL  Blood gas, arterial     Status: Abnormal   Collection Time: 06/11/16  4:58 PM  Result Value Ref Range   FIO2 30.00    Delivery systems VENTILATOR    Mode PRESSURE REGULATED VOLUME CONTROL    VT 550 mL   LHR 14 resp/min   Peep/cpap 5.0 cm H20   pH, Arterial 7.417 7.350 - 7.450   pCO2 arterial 34.0 32.0 - 48.0 mmHg   pO2, Arterial 89.0 83.0 - 108.0 mmHg   Bicarbonate 22.7 20.0 - 28.0 mmol/L   Acid-base deficit 2.3 (H) 0.0 - 2.0 mmol/L   O2 Saturation 96.0 %   Patient temperature 98.9    Collection site RIGHT RADIAL    Drawn by [id number]    Sample type ARTERIAL DRAW    Allens test (pass/fail) PASS PASS  Glucose, capillary     Status: Abnormal   Collection Time: 06/11/16  7:57 PM  Result Value Ref Range   Glucose-Capillary 106 (H) 65 - 99 mg/dL   Comment 1 Notify RN    Comment 2 Document in Chart   Glucose, capillary     Status: None   Collection Time: 06/12/16 12:22 AM  Result Value Ref Range   Glucose-Capillary 95 65 - 99 mg/dL   Comment 1 Notify RN    Comment 2 Document in Chart   Glucose, capillary     Status: None   Collection Time: 06/12/16  4:23 AM  Result Value Ref Range   Glucose-Capillary 90 65 - 99 mg/dL  Basic metabolic panel     Status: Abnormal   Collection Time: 06/12/16  4:28 AM  Result Value Ref Range   Sodium 139 135 - 145 mmol/L   Potassium 3.6 3.5 - 5.1 mmol/L   Chloride 110 101 - 111 mmol/L   CO2 25 22 - 32 mmol/L   Glucose, Bld 132 (H) 65 - 99 mg/dL   BUN 18 6 - 20 mg/dL   Creatinine, Ser 0.55 (L) 0.61 - 1.24 mg/dL   Calcium 7.9 (L) 8.9 - 10.3 mg/dL   GFR calc non Af Amer >60 >60 mL/min   GFR calc Af Amer >60 >60 mL/min    Comment: (NOTE) The eGFR has been calculated using the CKD  EPI equation. This calculation has not been validated in all clinical situations. eGFR's persistently <60 mL/min signify possible Chronic Kidney Disease.    Anion gap 4 (L) 5 - 15  CBC      Status: Abnormal   Collection Time: 06/12/16  4:28 AM  Result Value Ref Range   WBC 6.2 4.0 - 10.5 K/uL   RBC 3.37 (L) 4.22 - 5.81 MIL/uL   Hemoglobin 10.9 (L) 13.0 - 17.0 g/dL   HCT 31.9 (L) 39.0 - 52.0 %   MCV 94.7 78.0 - 100.0 fL   MCH 32.3 26.0 - 34.0 pg   MCHC 34.2 30.0 - 36.0 g/dL   RDW 16.1 (H) 11.5 - 15.5 %   Platelets 331 150 - 400 K/uL  Blood gas, arterial     Status: None   Collection Time: 06/12/16  4:35 AM  Result Value Ref Range   FIO2 30.00    Delivery systems VENTILATOR    Mode PRESSURE REGULATED VOLUME CONTROL    VT 550 mL   LHR 14 resp/min   Peep/cpap 5.0 cm H20   pH, Arterial 7.417 7.350 - 7.450   pCO2 arterial 36.6 32.0 - 48.0 mmHg   pO2, Arterial 93.7 83.0 - 108.0 mmHg   Bicarbonate 23.8 20.0 - 28.0 mmol/L   Acid-Base Excess 0.9 0.0 - 2.0 mmol/L   O2 Saturation 96.7 %   Collection site RIGHT RADIAL    Drawn by [id number]    Sample type ARTERIAL    Allens test (pass/fail) PASS PASS  Glucose, capillary     Status: Abnormal   Collection Time: 06/12/16  7:27 AM  Result Value Ref Range   Glucose-Capillary 111 (H) 65 - 99 mg/dL    ABGS  Recent Labs  06/12/16 0435  PHART 7.417  PO2ART 93.7  HCO3 23.8   CULTURES Recent Results (from the past 240 hour(s))  Urine culture     Status: Abnormal   Collection Time: 06/03/16 12:59 PM  Result Value Ref Range Status   Specimen Description URINE, CLEAN CATCH  Final   Special Requests NONE  Final   Culture >=100,000 COLONIES/mL ENTEROCOCCUS FAECALIS (A)  Final   Report Status 06/06/2016 FINAL  Final   Organism ID, Bacteria ENTEROCOCCUS FAECALIS (A)  Final      Susceptibility   Enterococcus faecalis - MIC*    AMPICILLIN <=2 SENSITIVE Sensitive     LEVOFLOXACIN 1 SENSITIVE Sensitive     NITROFURANTOIN <=16 SENSITIVE Sensitive     VANCOMYCIN 1 SENSITIVE Sensitive     * >=100,000 COLONIES/mL ENTEROCOCCUS FAECALIS  MRSA PCR Screening     Status: None   Collection Time: 06/09/16  5:00 PM  Result Value Ref Range  Status   MRSA by PCR NEGATIVE NEGATIVE Final    Comment:        The GeneXpert MRSA Assay (FDA approved for NASAL specimens only), is one component of a comprehensive MRSA colonization surveillance program. It is not intended to diagnose MRSA infection nor to guide or monitor treatment for MRSA infections.   Culture, blood (routine x 2)     Status: None (Preliminary result)   Collection Time: 06/09/16  7:15 PM  Result Value Ref Range Status   Specimen Description BLOOD RIGHT HAND  Final   Special Requests BOTTLES DRAWN AEROBIC AND ANAEROBIC 6CC  Final   Culture NO GROWTH 2 DAYS  Final   Report Status PENDING  Incomplete  Culture, blood (routine x 2)     Status: None (Preliminary  result)   Collection Time: 06/09/16  7:20 PM  Result Value Ref Range Status   Specimen Description BLOOD LEFT HAND  Final   Special Requests BOTTLES DRAWN AEROBIC AND ANAEROBIC 6CC  Final   Culture NO GROWTH 2 DAYS  Final   Report Status PENDING  Incomplete  CSF culture     Status: None (Preliminary result)   Collection Time: 06/10/16  8:00 AM  Result Value Ref Range Status   Specimen Description CSF  Final   Special Requests Normal  Final   Gram Stain   Final    NO ORGANISMS SEEN NO WBC SEEN CYTOSPIN SMEAR Gram Stain Report Called to,Read Back By and Verified With: WILLIE E. AT [id number] ON [id number] BY THOMPSON S.    Culture   Final    NO GROWTH 1 DAY Performed at [facility], [place] [address], [place], [place] [address]    Report Status PENDING  Incomplete   Studies/Results: Dg Chest Port 1 View  Result Date: 06/12/2016 CLINICAL DATA:  Intubated, follow-up.  Respiratory failure EXAM: PORTABLE CHEST 1 VIEW COMPARISON:  06/11/2016 FINDINGS: Endotracheal tube and NG tube remain in place, unchanged. Heart is normal size. Increasing bibasilar atelectasis or infiltrates. No visible effusions. Right PICC line is unchanged. IMPRESSION: Increasing bibasilar atelectasis or infiltrates. Electronically  Signed   By: Rolm Baptise M.D.   On: 06/12/2016 07:46   Dg Chest Port 1 View  Result Date: 06/11/2016 CLINICAL DATA:  On ventilator, respiratory failure, hypertension, smoker EXAM: PORTABLE CHEST 1 VIEW COMPARISON:  Portable exam 0555 hours compared to 06/10/2016 FINDINGS: To endotracheal tube 5.3 cm above carina. Nasogastric tube extends into stomach. RIGHT arm PICC line tip projects over cavoatrial junction. Normal heart size, mediastinal contours, and pulmonary vascularity. Atherosclerotic calcification aorta. Minimal RIGHT basilar atelectasis. Lungs otherwise clear. No pleural effusion or pneumothorax. Prior thoracolumbar spinal fusion. Osseous demineralization. IMPRESSION: Minimal RIGHT basilar atelectasis. Aortic atherosclerosis. Electronically Signed   By: Lavonia Dana M.D.   On: 06/11/2016 08:36    Medications:  Prior to Admission:  Prescriptions Prior to Admission  Medication Sig Dispense Refill Last Dose  . aspirin EC 81 MG tablet Take 81 mg by mouth daily.   Past Week at Unknown time  . benazepril (LOTENSIN) 40 MG tablet Take 40 mg by mouth daily.   Past Week at Unknown time  . cyclobenzaprine (FLEXERIL) 10 MG tablet Take 1 tablet (10 mg total) by mouth 3 (three) times daily as needed for muscle spasms. 30 tablet 0 Past Week at Unknown time  . diazepam (VALIUM) 10 MG tablet Take 0.5 tablets (5 mg total) by mouth every 6 (six) hours as needed for anxiety (back spasms). (Patient taking differently: Take 10 mg by mouth 4 (four) times daily as needed for anxiety (back spasms). ) 30 tablet 0 Past Week at Unknown time  . doxycycline (VIBRA-TABS) 100 MG tablet Take 1 tablet (100 mg total) by mouth 2 (two) times daily. 60 tablet 11 Past Week at Unknown time  . fluticasone (FLONASE) 50 MCG/ACT nasal spray Place 1 spray into the nose daily as needed for allergies.    unknown  . NEOMYCIN-POLYMYXIN-HYDROCORTISONE (CORTISPORIN) 1 % SOLN otic solution Place 2 drops in ear(s) daily. Reported on 11/12/2015    Past Week at Unknown time  . pregabalin (LYRICA) 75 MG capsule Take 75 mg by mouth 2 (two) times daily.    Past Week at Unknown time  . tamsulosin (FLOMAX) 0.4 MG CAPS capsule Take 0.4 mg  by mouth.   Past Week at Unknown time   Scheduled: . chlorhexidine gluconate (MEDLINE KIT)  15 mL Mouth Rinse BID  . Chlorhexidine Gluconate Cloth  6 each Topical Daily  . famotidine  20 mg Oral Daily  . IMMUNE GLOBULIN 10% (HUMAN) IV - For Fluid Restriction Only  400 mg/kg Intravenous Q24H  . mouth rinse  15 mL Mouth Rinse QID  . pantoprazole (PROTONIX) IV  40 mg Intravenous Daily  . polyvinyl alcohol  1 drop Both Eyes TID  . senna-docusate  1 tablet Oral QHS  . sodium chloride flush  10-40 mL Intracatheter Q12H  . tamsulosin  0.4 mg Oral QPC supper  . thiamine injection  100 mg Intravenous Daily   Continuous: . sodium chloride 100 mL/hr at 06/11/16 2000  . feeding supplement (VITAL HIGH PROTEIN) 1,000 mL (06/12/16 0058)  . phenylephrine (NEO-SYNEPHRINE) Adult infusion Stopped (06/10/16 1300)  . propofol (DIPRIVAN) infusion 45 mcg/kg/min (06/12/16 0754)   [facility]:[id number] **OR** acetaminophen, fentaNYL (SUBLIMAZE) injection, magnesium hydroxide, midazolam, midazolam, ondansetron **OR** ondansetron (ZOFRAN) IV, sodium chloride flush  Assesment: He was admitted with what was thought to be a urinary tract infection. 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 He has not finished his course of therapy. He developed acute respiratory failure hypovolemic shock and he is better as far as his blood pressure and all are concerned. 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 He has been agitated. I think we need to proceed slowly with weaning but it's okay to try today however I think he's going to need Haldol as suggested by Dr. [name] so that we can manage his  agitation. He does not apparently have lung disease at baseline so the limiting factor is going to be his mental status and his respiratory muscle status Principal Problem:   UTI (urinary tract infection) due to Enterococcus Active Problems:   HTN (hypertension)   Abdominal pain, generalized   Long term current use of antibiotics   Back pain, chronic   Discitis of lumbar region   Nerve disease, peripheral (HCC)   Neurogenic urinary bladder disorder   AKI (acute kidney injury) ([place])   Ataxia   Muscle weakness (generalized)   Ataxic gait   Tachycardia   Miller-Fisher variant Guillain-Barre syndrome (HCC)   Acute respiratory failure (HCC)   Hypovolemic shock (HCC)   Malnutrition of moderate degree    Plan: Try weaning as above    LOS: 9 days   Nayvie Lips L 06/12/2016, 9:25 AM

## 2016-06-13 ENCOUNTER — Inpatient Hospital Stay (HOSPITAL_COMMUNITY): Payer: PPO

## 2016-06-13 LAB — CSF CULTURE
GRAM STAIN: NONE SEEN
SPECIAL REQUESTS: NORMAL

## 2016-06-13 LAB — TRIGLYCERIDES: Triglycerides: 142 mg/dL (ref <150)

## 2016-06-13 LAB — CBC
HEMATOCRIT: 30.2 % — AB (ref 39.0–52.0)
HEMOGLOBIN: 10.4 g/dL — AB (ref 13.0–17.0)
MCH: 32.7 pg (ref 26.0–34.0)
MCHC: 34.4 g/dL (ref 30.0–36.0)
MCV: 95 fL (ref 78.0–100.0)
Platelets: 302 10*3/uL (ref 150–400)
RBC: 3.18 MIL/uL — ABNORMAL LOW (ref 4.22–5.81)
RDW: 16.3 % — AB (ref 11.5–15.5)
WBC: 5.7 10*3/uL (ref 4.0–10.5)

## 2016-06-13 LAB — BASIC METABOLIC PANEL
BUN: 16 mg/dL (ref 6–20)
CALCIUM: 7.8 mg/dL — AB (ref 8.9–10.3)
CO2: 27 mmol/L (ref 22–32)
Chloride: 108 mmol/L (ref 101–111)
Creatinine, Ser: 0.66 mg/dL (ref 0.61–1.24)
Glucose, Bld: 131 mg/dL — ABNORMAL HIGH (ref 65–99)
POTASSIUM: 3.4 mmol/L — AB (ref 3.5–5.1)
Sodium: 137 mmol/L (ref 135–145)

## 2016-06-13 LAB — GLUCOSE, CAPILLARY
GLUCOSE-CAPILLARY: 100 mg/dL — AB (ref 65–99)
GLUCOSE-CAPILLARY: 44 mg/dL — AB (ref 65–99)
GLUCOSE-CAPILLARY: 81 mg/dL (ref 65–99)
Glucose-Capillary: 93 mg/dL (ref 65–99)
Glucose-Capillary: 164 mg/dL — ABNORMAL HIGH (ref 65–99)
Glucose-Capillary: 97 mg/dL (ref 65–99)
Glucose-Capillary: 98 mg/dL (ref 65–99)
Glucose-Capillary: 110 mg/dL — ABNORMAL HIGH (ref 65–99)

## 2016-06-13 LAB — BLOOD GAS, ARTERIAL
Acid-Base Excess: 2.4 mmol/L — ABNORMAL HIGH (ref 0.0–2.0)
Bicarbonate: 27 mmol/L (ref 20.0–28.0)
Drawn by: 317771
FIO2: 0.3
MECHVT: 550 mL
O2 Content: 30 L/min
O2 SAT: 97.1 %
PCO2 ART: 34.9 mmHg (ref 32.0–48.0)
PEEP: 5 cmH2O
PH ART: 7.481 — AB (ref 7.350–7.450)
RATE: 14 resp/min
pO2, Arterial: 93.9 mmHg (ref 83.0–108.0)

## 2016-06-13 LAB — CSF CULTURE W GRAM STAIN: Culture: NO GROWTH

## 2016-06-13 MED ORDER — FUROSEMIDE 10 MG/ML IJ SOLN
40.0000 mg | Freq: Once | INTRAMUSCULAR | Status: AC
  Administered 2016-06-13: 40 mg via INTRAVENOUS
  Filled 2016-06-13: qty 4

## 2016-06-13 MED ORDER — PHENYLEPHRINE HCL 10 MG/ML IJ SOLN
INTRAMUSCULAR | Status: AC
  Filled 2016-06-13: qty 1

## 2016-06-13 MED ORDER — MIDAZOLAM HCL 50 MG/10ML IJ SOLN
INTRAMUSCULAR | Status: AC
  Filled 2016-06-13: qty 1

## 2016-06-13 MED ORDER — SODIUM CHLORIDE 0.9 % IV SOLN
0.5000 mg/h | INTRAVENOUS | Status: DC
  Administered 2016-06-13: 0.5 mg/h via INTRAVENOUS
  Administered 2016-06-14 (×2): 3 mg/h via INTRAVENOUS
  Administered 2016-06-15: 4 mg/h via INTRAVENOUS
  Administered 2016-06-16: 2 mg/h via INTRAVENOUS
  Administered 2016-06-16: 0.5 mg/h via INTRAVENOUS
  Administered 2016-06-16: 7 mg/h via INTRAVENOUS
  Administered 2016-06-18: 1 mg/h via INTRAVENOUS
  Filled 2016-06-13 (×3): qty 10

## 2016-06-13 NOTE — Progress Notes (Signed)
Dr Roderic Palau notified of patient's episodes of hypotension. Plan to change sedation from Propofol to Versed/Fentanyl per Dr Roderic Palau

## 2016-06-13 NOTE — Progress Notes (Addendum)
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 06/13/2016 CLINICAL DATA:  Respiratory failure EXAM: PORTABLE CHEST 1 VIEW COMPARISON:  06/12/2016 FINDINGS: Endotracheal tube unchanged. NG tube unchanged.  RIGHT PICC line unchanged. Normal cardiac silhouette. Mild interstitial edema pattern. No focal infiltrate. IMPRESSION: No clear evidence pneumonia.  Interstitial edema suspected. Electronically Signed   By: Suzy Bouchard M.D.   On: 06/13/2016 07:45   Dg Chest Port 1 View  Result Date: 06/12/2016 CLINICAL DATA:  Intubated, follow-up.  Respiratory failure EXAM: PORTABLE CHEST 1 VIEW COMPARISON:  06/11/2016 FINDINGS: Endotracheal tube and NG tube remain in place, unchanged. Heart is normal size. Increasing bibasilar atelectasis or infiltrates. No visible effusions. Right PICC line is unchanged. IMPRESSION: Increasing bibasilar atelectasis or infiltrates. Electronically Signed   By: Rolm Baptise M.D.   On: 06/12/2016 07:46   Dg Chest Port 1 View  Result Date: 06/11/2016 CLINICAL DATA:  On ventilator, respiratory  failure, hypertension, smoker EXAM: PORTABLE CHEST 1 VIEW COMPARISON:  Portable exam 0555 hours compared to 06/10/2016 FINDINGS: To endotracheal tube 5.3 cm above carina. Nasogastric tube extends into stomach. RIGHT arm PICC line tip projects over cavoatrial junction. Normal heart size, mediastinal contours, and pulmonary vascularity. Atherosclerotic calcification aorta. Minimal RIGHT basilar atelectasis. Lungs otherwise clear. No pleural effusion or pneumothorax. Prior thoracolumbar spinal fusion. Osseous demineralization. IMPRESSION: Minimal RIGHT basilar atelectasis. Aortic atherosclerosis. Electronically Signed   By: Lavonia Dana M.D.   On: 06/11/2016 08:36   Dg Chest Port 1 View  Result Date: 06/10/2016 CLINICAL DATA:  Respiratory failure EXAM: PORTABLE CHEST 1 VIEW COMPARISON:  06/09/2016 FINDINGS: Endotracheal tube and nasogastric catheter are again seen and stable. The nasogastric catheter shows the proximal side port to lie within the distal esophagus and could be advanced slightly. Right-sided PICC line has been placed in the interval in satisfactory position. The lungs are well-aerated without focal infiltrate or sizable effusion. Postsurgical changes in the thoracolumbar spine are again seen. IMPRESSION: Interval PICC line placement in satisfactory position. Nasogastric catheter as described which could be advanced as necessary. No other focal abnormality is seen. Electronically Signed   By: Inez Catalina M.D.   On: 06/10/2016 09:23   Portable Chest Xray  Result Date: 06/09/2016 CLINICAL DATA:  Respiratory failure. EXAM: PORTABLE CHEST 1 VIEW COMPARISON:  Radiographs of June 04, 2016. FINDINGS: The heart size and mediastinal contours are within normal limits. Both lungs are clear. No pneumothorax or pleural effusion is noted. Endotracheal tube is seen projected over tracheal air shadow with distal tip 4 cm above the carina. Nasogastric tube tip is seen in proximal stomach. The visualized skeletal  structures are unremarkable. IMPRESSION: Endotracheal tube in grossly good position. Nasogastric tube tip seen in proximal stomach. No acute cardiopulmonary abnormality seen. Electronically Signed   By: Marijo Conception, M.D.   On: 06/09/2016 18:07   Dg Foot Complete Left  Result Date: 06/03/2016 CLINICAL DATA:  Increased weakness x 2 weeks. Has hard time walking, multiple falls. Pt says that he has been crawling around the house the last few days due to inability to walk. Pain and bruising lt foot. Pt held for imaging. Hx of surgery on the left knee and ankle. He has had problems since the surgery. EXAM: LEFT FOOT - COMPLETE 3+ VIEW COMPARISON:  None. FINDINGS: No fracture.  No bone lesion. The joints are normally aligned. Small amount of calcification at the base of the plantar fascia adjacent to the calcaneal tuberosity. Soft tissues are otherwise unremarkable. IMPRESSION: No fracture, bone lesion or significant joint abnormality. No acute finding. Electronically Signed   By: Lajean Manes M.D.   On: 06/03/2016 11:24    Time Spent in minutes  Critical care: 51mns   Sevana Grandinetti M.D on 06/13/2016 at 10:53 AM  Between 7am to 7pm - Pager - [phone] After 7pm go to [email or web address] - password [facility] -  Office  [phone]  Addendum [phone]  Reported by staff that patient has periods of hypotension, felt to be related to sedation. We'll discontinue propofol and try the patient on midazolam infusion.  Dewane Timson

## 2016-06-13 NOTE — Progress Notes (Signed)
Paged MD to advise pt has increased edema LUE, radial pulse +1 with very slow cap refill on left hand, skin cold to touch. Generalized edema to all extremities. Arm elevated with heating pack in hand. Md advised he would come look at patient. No orders given.

## 2016-06-14 ENCOUNTER — Inpatient Hospital Stay (HOSPITAL_COMMUNITY): Payer: PPO

## 2016-06-14 DIAGNOSIS — G629 Polyneuropathy, unspecified: Secondary | ICD-10-CM | POA: Diagnosis not present

## 2016-06-14 LAB — GLUCOSE, CAPILLARY
GLUCOSE-CAPILLARY: 111 mg/dL — AB (ref 65–99)
GLUCOSE-CAPILLARY: 115 mg/dL — AB (ref 65–99)
GLUCOSE-CAPILLARY: 126 mg/dL — AB (ref 65–99)
Glucose-Capillary: 118 mg/dL — ABNORMAL HIGH (ref 65–99)
Glucose-Capillary: 143 mg/dL — ABNORMAL HIGH (ref 65–99)
Glucose-Capillary: 101 mg/dL — ABNORMAL HIGH (ref 65–99)

## 2016-06-14 LAB — BLOOD GAS, ARTERIAL
ACID-BASE EXCESS: 3.9 mmol/L — AB (ref 0.0–2.0)
BICARBONATE: 27.9 mmol/L (ref 20.0–28.0)
DRAWN BY: 317771
FIO2: 0.3
LHR: 14 {breaths}/min
MECHVT: 550 mL
O2 Content: 30 L/min
O2 SAT: 97.1 %
PCO2 ART: 40.7 mmHg (ref 32.0–48.0)
PEEP/CPAP: 5 cmH2O
PH ART: 7.449 (ref 7.350–7.450)
pO2, Arterial: 97.8 mmHg (ref 83.0–108.0)

## 2016-06-14 LAB — CBC
HCT: 32.4 % — ABNORMAL LOW (ref 39.0–52.0)
Hemoglobin: 11 g/dL — ABNORMAL LOW (ref 13.0–17.0)
MCH: 32.4 pg (ref 26.0–34.0)
MCHC: 34 g/dL (ref 30.0–36.0)
MCV: 95.6 fL (ref 78.0–100.0)
PLATELETS: 304 10*3/uL (ref 150–400)
RBC: 3.39 MIL/uL — ABNORMAL LOW (ref 4.22–5.81)
RDW: 16.4 % — AB (ref 11.5–15.5)
WBC: 7.8 10*3/uL (ref 4.0–10.5)

## 2016-06-14 LAB — BASIC METABOLIC PANEL
Anion gap: 6 (ref 5–15)
BUN: 25 mg/dL — AB (ref 6–20)
CALCIUM: 8.3 mg/dL — AB (ref 8.9–10.3)
CHLORIDE: 105 mmol/L (ref 101–111)
CO2: 29 mmol/L (ref 22–32)
CREATININE: 0.72 mg/dL (ref 0.61–1.24)
GFR calc Af Amer: >60 mL/min (ref >60)
Glucose, Bld: 137 mg/dL — ABNORMAL HIGH (ref 65–99)
Potassium: 3.4 mmol/L — ABNORMAL LOW (ref 3.5–5.1)
SODIUM: 140 mmol/L (ref 135–145)

## 2016-06-14 MED ORDER — MIDAZOLAM HCL 50 MG/10ML IJ SOLN
INTRAMUSCULAR | Status: AC
  Filled 2016-06-14: qty 1

## 2016-06-14 MED ORDER — HALOPERIDOL 2 MG PO TABS
5.0000 mg | ORAL_TABLET | Freq: Two times a day (BID) | ORAL | Status: DC
  Administered 2016-06-14 – 2016-06-16 (×4): 5 mg via ORAL
  Filled 2016-06-14 (×4): qty 3

## 2016-06-14 MED ORDER — MAGNESIUM CITRATE PO SOLN
1.0000 | Freq: Once | ORAL | Status: AC
  Administered 2016-06-14: 1
  Filled 2016-06-14: qty 296

## 2016-06-14 MED ORDER — HALOPERIDOL LACTATE 5 MG/ML IJ SOLN
5.0000 mg | Freq: Two times a day (BID) | INTRAMUSCULAR | Status: DC
Start: 2016-06-14 — End: 2016-06-17
  Administered 2016-06-14 – 2016-06-15 (×2): 5 mg via INTRAMUSCULAR
  Filled 2016-06-14 (×2): qty 1

## 2016-06-14 NOTE — Progress Notes (Signed)
TF currently going through OGT. OGT placement checked. All suction canisters are hooked up correctly and working appropriately.

## 2016-06-14 NOTE — Progress Notes (Signed)
[place] A. [name], MD     [email or web address]          Marc Rose is an 66 y.o. male.   ASSESSMENT/PLAN:      Severe delirium/encephalopathy of unclear etiology at this time. We are suspecting that this may be multifactorial including UTI and medication effect from Levaquin. Levaquin has been discontinued. The picture seems most consistent with toxic metabolic encephalopathy/delirium as opposed to primary and CNS problem. The patient's spinal fluid analysis shows the typical formula with elevated protein and essentially unremarkable white cell count (cytoalbuminologic dissociation) seen in demyelinating conditions such as Guillain-Barr syndrome. The CSF does not show evidence of central nervous system infection. Attempted wean off the ventilator this weekend with associated with the patient becoming quite restless. I will place the patient on Haldol to help with his behavioral issues which she has had in the past.    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 Completed 5 day course of IV IG    Low back pain status post L1-L2 laminectomy. HX of chronic discitis      Attempted wean this weekend. He is still on sedation but less today. There was no episode of hypotension yesterday.         GENERAL: He is intubated.   HEENT: Supple. Atraumatic normocephalic. Dense cataracts both sides.  ABDOMEN: soft  EXTREMITIES: No edema. Multiple abrasions and lacerations involving the knees and legs.   BACK: Normal.  SKIN: Normal by inspection.    MENTAL STATUS: The patient lays in bed with eyes closed. He does open his eyes to light sternal rub. He does focuses, tracks and occasionally follow commands. He seems less restless.  CRANIAL NERVES: Pupils are mid position and reactive. He seems to have full extraocular movements. Facial muscles are  symmetric.   MOTOR: Moves both sides well.  COORDINATION: No tremor or dysmetria  REFLEXES: The patient is areflexic in the legs and upper extremities.      Blood pressure 121/81, pulse (!) 116, temperature 98 F (36.7 C), temperature source Oral, resp. rate 20, height 6' 2" (1.88 m), weight 162 lb 7.7 oz (73.7 kg), SpO2 100 %.  Past Medical History:  Diagnosis Date  . Anxiety state, unspecified   . Arthritis   . 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 Benign neoplasm of colon   . Causalgia of upper limb    Left limb  . Chronic back pain   . Chronic leg pain   . Depression   . Diabetes mellitus   . Diskitis 02/05/2015  . Drug rash 11/12/2015  . Elevated blood pressure reading without diagnosis of hypertension   . Essential hypertension, benign   . Hardware complicating wound infection ([place]) 02/05/2015  . Hyperpigmentation 05/15/2015  . Hypertension   . Impaired fasting glucose   . Peripheral arterial disease ([place])   . Pure hypercholesterolemia   . Tobacco use disorder   . Unspecified retinal detachment     Past Surgical History:  Procedure Laterality Date  . ANKLE SURGERY     left  . APPENDECTOMY    . BACK SURGERY    . COLONOSCOPY    . KNEE SURGERY     left    Family History  Problem Relation Age of Onset  . COPD Mother   . Hyperthyroidism Mother   . Hyperthyroidism Sister     Social History:  reports that he has been smoking Cigarettes.  He has a 19.50 pack-year smoking history. He has never used smokeless tobacco. He reports that he uses drugs, including Marijuana. He reports that he does not drink alcohol.  Allergies:  Allergies  Allergen Reactions  . Codeine Anaphylaxis, Hives and Swelling  . Penicillins Anaphylaxis, Hives and Swelling  . Latex Itching  . Strawberry Extract Hives    Medications: Prior to Admission medications   Medication Sig Start Date End Date Taking? Authorizing Provider  aspirin EC 81 MG tablet Take 81 mg by mouth daily.   Yes Historical  Provider, MD  benazepril (LOTENSIN) 40 MG tablet Take 40 mg by mouth daily.   Yes Historical Provider, MD  cyclobenzaprine (FLEXERIL) 10 MG tablet Take 1 tablet (10 mg total) by mouth 3 (three) times daily as needed for muscle spasms. 09/19/13  Yes Adeline Saralyn Pilar, MD  diazepam (VALIUM) 10 MG tablet Take 0.5 tablets (5 mg total) by mouth every 6 (six) hours as needed for anxiety (back spasms). Patient taking differently: Take 10 mg by mouth 4 (four) times daily as needed for anxiety (back spasms).  09/19/13  Yes Adeline Saralyn Pilar, MD  doxycycline (VIBRA-TABS) 100 MG tablet Take 1 tablet (100 mg total) by mouth 2 (two) times daily. 11/27/15  Yes Truman Hayward, MD  fluticasone [facility]) 50 MCG/ACT nasal spray Place 1 spray into the nose daily as needed for allergies.  07/04/14  Yes Historical Provider, MD  NEOMYCIN-POLYMYXIN-HYDROCORTISONE (CORTISPORIN) 1 % SOLN otic solution Place 2 drops in ear(s) daily. Reported on 11/12/2015 07/04/14  Yes Historical Provider, MD  pregabalin (LYRICA) 75 MG capsule Take 75 mg by mouth 2 (two) times daily.  05/07/13  Yes Historical Provider, MD  tamsulosin (FLOMAX) 0.4 MG CAPS capsule Take 0.4 mg by mouth.   Yes Historical Provider, MD    Scheduled Meds: . chlorhexidine gluconate (MEDLINE KIT)  15 mL Mouth Rinse BID  . Chlorhexidine Gluconate Cloth  6 each Topical Daily  . famotidine  20 mg Oral Daily  . mouth rinse  15 mL Mouth Rinse QID  . pantoprazole (PROTONIX) IV  40 mg Intravenous Daily  . polyvinyl alcohol  1 drop Both Eyes TID  . senna-docusate  1 tablet Oral QHS  . sodium chloride flush  10-40 mL Intracatheter Q12H  . tamsulosin  0.4 mg Oral QPC supper  . thiamine injection  100 mg Intravenous Daily   Continuous Infusions: . feeding supplement (VITAL HIGH PROTEIN) 1,000 mL (06/14/16 0315)  . midazolam (VERSED) infusion 3 mg/hr (06/14/16 0440)  . phenylephrine (NEO-SYNEPHRINE) Adult infusion 25 mcg/min (06/14/16 0315)   PRN Meds:.acetaminophen **OR**  acetaminophen, fentaNYL (SUBLIMAZE) injection, haloperidol lactate, magnesium hydroxide, midazolam, midazolam, ondansetron **OR** ondansetron (ZOFRAN) IV, sodium chloride flush     Results for orders placed or performed during the hospital encounter of 06/03/16 (from the past 48 hour(s))  Glucose, capillary     Status: Abnormal   Collection Time: 06/12/16 11:30 AM  Result Value Ref Range   Glucose-Capillary 107 (H) 65 - 99 mg/dL  Glucose, capillary     Status: None   Collection Time: 06/12/16  7:51 PM  Result Value Ref Range   Glucose-Capillary 99 65 - 99 mg/dL  Glucose, capillary     Status: None   Collection Time: 06/12/16 11:59 PM  Result Value Ref Range   Glucose-Capillary 75 65 - 99 mg/dL  Glucose, capillary     Status: Abnormal   Collection Time: 06/13/16  1:42 AM  Result Value Ref Range  Glucose-Capillary 44 (LL) 65 - 99 mg/dL  Glucose, capillary     Status: None   Collection Time: 06/13/16  1:44 AM  Result Value Ref Range   Glucose-Capillary 81 65 - 99 mg/dL  Glucose, capillary     Status: Abnormal   Collection Time: 06/13/16  1:51 AM  Result Value Ref Range   Glucose-Capillary 100 (H) 65 - 99 mg/dL  Blood gas, arterial     Status: Abnormal   Collection Time: 06/13/16  3:43 AM  Result Value Ref Range   FIO2 0.30    O2 Content 30.0 L/min   Delivery systems VENTILATOR    Mode PRESSURE REGULATED VOLUME CONTROL    VT 550 mL   LHR 14.0 resp/min   Peep/cpap 5.0 cm H20   pH, Arterial 7.481 (H) 7.350 - 7.450   pCO2 arterial 34.9 32.0 - 48.0 mmHg   pO2, Arterial 93.9 83.0 - 108.0 mmHg   Bicarbonate 27.0 20.0 - 28.0 mmol/L   Acid-Base Excess 2.4 (H) 0.0 - 2.0 mmol/L   O2 Saturation 97.1 %   Collection site RIGHT RADIAL    Drawn by [phone]    Sample type ARTERIAL DRAW    Allens test (pass/fail) PASS PASS  Glucose, capillary     Status: None   Collection Time: 06/13/16  4:01 AM  Result Value Ref Range   Glucose-Capillary 93 65 - 99 mg/dL  Triglycerides     Status:  None   Collection Time: 06/13/16  4:50 AM  Result Value Ref Range   Triglycerides 142 <150 mg/dL  Basic metabolic panel     Status: Abnormal   Collection Time: 06/13/16  4:50 AM  Result Value Ref Range   Sodium 137 135 - 145 mmol/L   Potassium 3.4 (L) 3.5 - 5.1 mmol/L   Chloride 108 101 - 111 mmol/L   CO2 27 22 - 32 mmol/L   Glucose, Bld 131 (H) 65 - 99 mg/dL   BUN 16 6 - 20 mg/dL   Creatinine, Ser 0.66 0.61 - 1.24 mg/dL   Calcium 7.8 (L) 8.9 - 10.3 mg/dL   GFR calc non Af Amer >60 >60 mL/min   GFR calc Af Amer >60 >60 mL/min    Comment: (NOTE) The eGFR has been calculated using the CKD EPI equation. This calculation has not been validated in all clinical situations. eGFR's persistently <60 mL/min signify possible Chronic Kidney Disease.   CBC     Status: Abnormal   Collection Time: 06/13/16  4:50 AM  Result Value Ref Range   WBC 5.7 4.0 - 10.5 K/uL   RBC 3.18 (L) 4.22 - 5.81 MIL/uL   Hemoglobin 10.4 (L) 13.0 - 17.0 g/dL   HCT 30.2 (L) 39.0 - 52.0 %   MCV 95.0 78.0 - 100.0 fL   MCH 32.7 26.0 - 34.0 pg   MCHC 34.4 30.0 - 36.0 g/dL   RDW 16.3 (H) 11.5 - 15.5 %   Platelets 302 150 - 400 K/uL  Glucose, capillary     Status: Abnormal   Collection Time: 06/13/16  7:56 AM  Result Value Ref Range   Glucose-Capillary 164 (H) 65 - 99 mg/dL   Comment 1 Notify RN    Comment 2 Document in Chart   Glucose, capillary     Status: None   Collection Time: 06/13/16 11:30 AM  Result Value Ref Range   Glucose-Capillary 97 65 - 99 mg/dL   Comment 1 Notify RN    Comment 2 Document in Chart  Glucose, capillary     Status: None   Collection Time: 06/13/16  5:10 PM  Result Value Ref Range   Glucose-Capillary 98 65 - 99 mg/dL  Glucose, capillary     Status: Abnormal   Collection Time: 06/13/16  8:00 PM  Result Value Ref Range   Glucose-Capillary 110 (H) 65 - 99 mg/dL  Glucose, capillary     Status: Abnormal   Collection Time: 06/14/16  2:26 AM  Result Value Ref Range    Glucose-Capillary 111 (H) 65 - 99 mg/dL  Blood gas, arterial     Status: Abnormal   Collection Time: 06/14/16  4:25 AM  Result Value Ref Range   FIO2 0.30    O2 Content 30.0 L/min   Delivery systems VENTILATOR    Mode PRESSURE REGULATED VOLUME CONTROL    VT 550 mL   LHR 14 resp/min   Peep/cpap 5.0 cm H20   pH, Arterial 7.449 7.350 - 7.450   pCO2 arterial 40.7 32.0 - 48.0 mmHg   pO2, Arterial 97.8 83.0 - 108.0 mmHg   Bicarbonate 27.9 20.0 - 28.0 mmol/L   Acid-Base Excess 3.9 (H) 0.0 - 2.0 mmol/L   O2 Saturation 97.1 %   Collection site RIGHT RADIAL    Drawn by [id number]    Sample type ARTERIAL DRAW    Allens test (pass/fail) PASS PASS  Basic metabolic panel     Status: Abnormal   Collection Time: 06/14/16  4:34 AM  Result Value Ref Range   Sodium 140 135 - 145 mmol/L   Potassium 3.4 (L) 3.5 - 5.1 mmol/L   Chloride 105 101 - 111 mmol/L   CO2 29 22 - 32 mmol/L   Glucose, Bld 137 (H) 65 - 99 mg/dL   BUN 25 (H) 6 - 20 mg/dL   Creatinine, Ser 0.72 0.61 - 1.24 mg/dL   Calcium 8.3 (L) 8.9 - 10.3 mg/dL   GFR calc non Af Amer >60 >60 mL/min   GFR calc Af Amer >60 >60 mL/min    Comment: (NOTE) The eGFR has been calculated using the CKD EPI equation. This calculation has not been validated in all clinical situations. eGFR's persistently <60 mL/min signify possible Chronic Kidney Disease.    Anion gap 6 5 - 15  CBC     Status: Abnormal   Collection Time: 06/14/16  4:34 AM  Result Value Ref Range   WBC 7.8 4.0 - 10.5 K/uL   RBC 3.39 (L) 4.22 - 5.81 MIL/uL   Hemoglobin 11.0 (L) 13.0 - 17.0 g/dL   HCT 32.4 (L) 39.0 - 52.0 %   MCV 95.6 78.0 - 100.0 fL   MCH 32.4 26.0 - 34.0 pg   MCHC 34.0 30.0 - 36.0 g/dL   RDW 16.4 (H) 11.5 - 15.5 %   Platelets 304 150 - 400 K/uL  Glucose, capillary     Status: Abnormal   Collection Time: 06/14/16  4:43 AM  Result Value Ref Range   Glucose-Capillary 118 (H) 65 - 99 mg/dL  Glucose, capillary     Status: Abnormal   Collection Time: 06/14/16  7:57  AM  Result Value Ref Range   Glucose-Capillary 115 (H) 65 - 99 mg/dL    Studies/Results:  CTA LUNG IMPRESSION: Manubrial fracture  No evidence of pulmonary emboli.  Left lower lobe infiltrate and effusion.     BRAIN MRI FINDINGS: Brain: Cerebral volume is within normal limits for age. No restricted diffusion to suggest acute infarction. No midline shift, mass effect, evidence  of mass lesion, ventriculomegaly, extra-axial collection or acute intracranial hemorrhage. Cervicomedullary junction and pituitary are within normal limits.  Pearline Cables and white matter signal is within normal limits for age throughout the brain. No cortical encephalomalacia or chronic cerebral blood products.  Vascular: Major intracranial vascular flow voids are preserved.  Skull and upper cervical spine: Negative. Normal bone marrow signal.  Sinuses/Orbits: Normal orbits soft tissues. Trace paranasal sinus mucosal thickening.  Other: Visible internal auditory structures appear normal. Mastoids are clear. Negative scalp soft tissues.  IMPRESSION: No acute intracranial abnormality. Normal for age noncontrast MRI appearance of the brain.            L SPINE MRI 2015 FINDINGS: Trace anterolisthesis of L3 on L4 and L4 on L5 is unchanged. There is grade 1 retrolisthesis of L1 on L2, stable to minimally increased from prior MRI. New from the prior MRI is a mild compression deformity involving the L2 superior endplate with approximately 10% vertebral body height loss. There is abnormal fluid signal within the L1-2 disc space, and there is also new edema throughout the L1 and L2 vertebral bodies. There is paraspinal inflammatory change/ phlegmon at this level, and there is a 1.9 x 1.3 cm fluid collection in the left psoas muscle at the L2 level (series 5, image 16).  Advanced disc space height loss at L5-S1 is unchanged, however there is increased fluid signal within the disc space  compared to the prior MRI with mildly increased marrow edema within the adjacent L5 and S1 vertebral bodies. The conus medullaris is normal in signal and terminates at the superior aspect of L2.  T11-12: Only imaged sagittally. Facet hypertrophy results in mild bilateral neural foraminal narrowing, right greater than left and unchanged.  T12-L1:  Negative.  L1-2: Sequelae of prior laminectomy and posterior fusion are again identified. Posterior retropulsion of the superior L2 endplate, greater on the right, with possibly small adjacent epidural fluid collection, results in new narrowing of the right lateral recess and increased right neural foraminal narrowing. No spinal canal stenosis.  L2-3: Mild disc bulge and facet hypertrophy result in mild right lateral recess and neural foraminal narrowing, unchanged.  L3-4: Mild disc bulge and moderate facet hypertrophy result in mild right neural foraminal narrowing, unchanged. No spinal canal stenosis.  L4-5: Left foraminal disc protrusion and mild-to-moderate facet hypertrophy result in mild to moderate left neural foraminal stenosis, unchanged. No spinal canal stenosis.  L5-S1: Disc bulge and mild facet hypertrophy result in mild to moderate bilateral neural foraminal stenosis, unchanged. No spinal canal stenosis.  IMPRESSION: 1. Changes at L1-2 as above concerning for discitis/osteomyelitis with small left psoas abscess. New, mild compression deformity of the L2 superior endplate and mild retropulsion results in new right lateral recess narrowing and increased right neural foraminal narrowing at L1-2. Small ventral epidural fluid collection is not excluded on the right. 2. Advanced degenerative disc disease at L5-S1 with a small amount of new fluid signal in the disc space and mildly increased adjacent marrow changes. Early discitis/osteomyelitis is not excluded.        A. [name], M.D.  Diplomate, [employer]rology ( Neurology). 06/14/2016, 8:05 AM

## 2016-06-14 NOTE — Progress Notes (Signed)
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 unchanged in position. No segmental infiltrate. Stable right arm PICC line position. NG tube with tip in  proximal stomach. IMPRESSION: Again noted mild perihilar interstitial prominence suspicious for mild interstitial edema. Endotracheal tube and NG tube is unchanged in position. No segmental infiltrate. Stable right arm PICC line position. NG tube with tip in proximal stomach. Electronically Signed   By: Lahoma Crocker M.D.   On: 06/14/2016 09:42   Dg Chest Port 1 View  Result Date: 06/13/2016 CLINICAL DATA:  Respiratory failure EXAM: PORTABLE CHEST 1 VIEW COMPARISON:  06/12/2016 FINDINGS: Endotracheal tube unchanged. NG tube unchanged. RIGHT PICC line unchanged. Normal cardiac silhouette. Mild interstitial edema pattern. No focal infiltrate. IMPRESSION: No clear evidence pneumonia.  Interstitial edema suspected. Electronically Signed   By: Suzy Bouchard M.D.   On: 06/13/2016 07:45   Dg Chest Port 1 View  Result Date: 06/12/2016 CLINICAL DATA:  Intubated, follow-up.   Respiratory failure EXAM: PORTABLE CHEST 1 VIEW COMPARISON:  06/11/2016 FINDINGS: Endotracheal tube and NG tube remain in place, unchanged. Heart is normal size. Increasing bibasilar atelectasis or infiltrates. No visible effusions. Right PICC line is unchanged. IMPRESSION: Increasing bibasilar atelectasis or infiltrates. Electronically Signed   By: Rolm Baptise M.D.   On: 06/12/2016 07:46   Dg Chest Port 1 View  Result Date: 06/11/2016 CLINICAL DATA:  On ventilator, respiratory failure, hypertension, smoker EXAM: PORTABLE CHEST 1 VIEW COMPARISON:  Portable exam 0555 hours compared to 06/10/2016 FINDINGS: To endotracheal tube 5.3 cm above carina. Nasogastric tube extends into stomach. RIGHT arm PICC line tip projects over cavoatrial junction. Normal heart size, mediastinal contours, and pulmonary vascularity. Atherosclerotic calcification aorta. Minimal RIGHT basilar atelectasis. Lungs otherwise clear. No pleural effusion or pneumothorax. Prior thoracolumbar spinal fusion. Osseous demineralization. IMPRESSION: Minimal RIGHT basilar atelectasis. Aortic atherosclerosis. Electronically Signed   By: Lavonia Dana M.D.   On: 06/11/2016 08:36   Dg Chest Port 1 View  Result Date: 06/10/2016 CLINICAL DATA:  Respiratory failure EXAM: PORTABLE CHEST 1 VIEW COMPARISON:  06/09/2016 FINDINGS: Endotracheal tube and nasogastric catheter are again seen and stable. The nasogastric catheter shows the proximal side port to lie within the distal esophagus and could be advanced slightly. Right-sided PICC line has been placed in the interval in satisfactory position. The lungs are well-aerated without focal infiltrate or sizable effusion. Postsurgical changes in the thoracolumbar spine are again seen. IMPRESSION: Interval PICC line placement in satisfactory position. Nasogastric catheter as described which could be advanced as necessary. No other focal abnormality is seen. Electronically Signed   By: Inez Catalina M.D.   On:  06/10/2016 09:23   Portable Chest Xray  Result Date: 06/09/2016 CLINICAL DATA:  Respiratory failure. EXAM: PORTABLE CHEST 1 VIEW COMPARISON:  Radiographs of June 04, 2016. FINDINGS: The heart size and mediastinal contours are within normal limits. Both lungs are clear. No pneumothorax or pleural effusion is noted. Endotracheal tube is seen projected over tracheal air shadow with distal tip 4 cm above the carina. Nasogastric tube tip is seen in proximal stomach. The visualized skeletal structures are unremarkable. IMPRESSION: Endotracheal tube in grossly good position. Nasogastric tube tip seen in proximal stomach. No acute cardiopulmonary abnormality seen. Electronically Signed   By: Marijo Conception, M.D.   On: 06/09/2016 18:07   Dg Foot Complete Left  Result Date: 06/03/2016 CLINICAL DATA:  Increased weakness x 2 weeks. Has hard time walking, multiple falls. Pt says that he has been crawling around the house the last few days due to inability to walk. Pain and bruising lt foot. Pt held for imaging. Hx of surgery on the left knee and ankle.  He has had problems since the surgery. EXAM: LEFT FOOT - COMPLETE 3+ VIEW COMPARISON:  None. FINDINGS: No fracture.  No bone lesion. The joints are normally aligned. Small amount of calcification at the base of the plantar fascia adjacent to the calcaneal tuberosity. Soft tissues are otherwise unremarkable. IMPRESSION: No fracture, bone lesion or significant joint abnormality. No acute finding. Electronically Signed   By: Lajean Manes M.D.   On: 06/03/2016 11:24    Time Spent in minutes  Critical care: 73mns   Netha Dafoe M.D on 06/14/2016 at 10:30 AM  Between 7am to 7pm - Pager - [phone] After 7pm go to [email or web address] - password [email or web address] -  Office  [phone]

## 2016-06-15 ENCOUNTER — Inpatient Hospital Stay (HOSPITAL_COMMUNITY)
Admit: 2016-06-15 | Discharge: 2016-06-15 | Disposition: A | Discharge status: Home / Self Care | Payer: PPO | Attending: Neurology | Admitting: Neurology

## 2016-06-15 ENCOUNTER — Inpatient Hospital Stay (HOSPITAL_COMMUNITY): Payer: PPO

## 2016-06-15 DIAGNOSIS — M4646 Discitis, unspecified, lumbar region: Secondary | ICD-10-CM

## 2016-06-15 DIAGNOSIS — R4182 Altered mental status, unspecified: Secondary | ICD-10-CM | POA: Diagnosis not present

## 2016-06-15 DIAGNOSIS — R2689 Other abnormalities of gait and mobility: Secondary | ICD-10-CM | POA: Diagnosis not present

## 2016-06-15 DIAGNOSIS — R569 Unspecified convulsions: Secondary | ICD-10-CM | POA: Diagnosis not present

## 2016-06-15 DIAGNOSIS — G629 Polyneuropathy, unspecified: Secondary | ICD-10-CM | POA: Diagnosis not present

## 2016-06-15 LAB — BLOOD GAS, ARTERIAL
Acid-Base Excess: 5.4 mmol/L — ABNORMAL HIGH (ref 0.0–2.0)
BICARBONATE: 29.2 mmol/L — AB (ref 20.0–28.0)
Drawn by: 405301
FIO2: 30
O2 SAT: 97.2 %
PEEP: 5 cmH2O
PH ART: 7.462 — AB (ref 7.350–7.450)
Patient temperature: 37
RATE: 14 resp/min
VT: 550 mL
pCO2 arterial: 41.5 mmHg (ref 32.0–48.0)
pO2, Arterial: 94.6 mmHg (ref 83.0–108.0)

## 2016-06-15 LAB — COMPREHENSIVE METABOLIC PANEL
ALT: 53 U/L (ref 17–63)
ANION GAP: 3 — AB (ref 5–15)
AST: 56 U/L — ABNORMAL HIGH (ref 15–41)
Albumin: 2.3 g/dL — ABNORMAL LOW (ref 3.5–5.0)
Alkaline Phosphatase: 101 U/L (ref 38–126)
BUN: 34 mg/dL — ABNORMAL HIGH (ref 6–20)
CHLORIDE: 107 mmol/L (ref 101–111)
CO2: 30 mmol/L (ref 22–32)
Calcium: 8.3 mg/dL — ABNORMAL LOW (ref 8.9–10.3)
Creatinine, Ser: 0.72 mg/dL (ref 0.61–1.24)
Glucose, Bld: 120 mg/dL — ABNORMAL HIGH (ref 65–99)
POTASSIUM: 3.9 mmol/L (ref 3.5–5.1)
Sodium: 140 mmol/L (ref 135–145)
Total Bilirubin: 0.6 mg/dL (ref 0.3–1.2)
Total Protein: 6.7 g/dL (ref 6.5–8.1)

## 2016-06-15 LAB — GLUCOSE, CAPILLARY
GLUCOSE-CAPILLARY: 104 mg/dL — AB (ref 65–99)
GLUCOSE-CAPILLARY: 132 mg/dL — AB (ref 65–99)
GLUCOSE-CAPILLARY: 129 mg/dL — AB (ref 65–99)
GLUCOSE-CAPILLARY: 128 mg/dL — AB (ref 65–99)
Glucose-Capillary: 116 mg/dL — ABNORMAL HIGH (ref 65–99)
Glucose-Capillary: 121 mg/dL — ABNORMAL HIGH (ref 65–99)
Glucose-Capillary: 127 mg/dL — ABNORMAL HIGH (ref 65–99)

## 2016-06-15 LAB — CULTURE, BLOOD (ROUTINE X 2)
Culture: NO GROWTH
Culture: NO GROWTH

## 2016-06-15 LAB — CBC
HEMATOCRIT: 31.1 % — AB (ref 39.0–52.0)
HEMOGLOBIN: 10.5 g/dL — AB (ref 13.0–17.0)
MCH: 32.6 pg (ref 26.0–34.0)
MCHC: 33.8 g/dL (ref 30.0–36.0)
MCV: 96.6 fL (ref 78.0–100.0)
Platelets: 329 10*3/uL (ref 150–400)
RBC: 3.22 MIL/uL — AB (ref 4.22–5.81)
RDW: 16.5 % — ABNORMAL HIGH (ref 11.5–15.5)
WBC: 9.7 10*3/uL (ref 4.0–10.5)

## 2016-06-15 MED ORDER — VITAL AF 1.2 CAL PO LIQD
1000.0000 mL | ORAL | Status: DC
  Administered 2016-06-15 – 2016-06-17 (×2): 1000 mL
  Filled 2016-06-15 (×10): qty 1000

## 2016-06-15 MED ORDER — BISACODYL 10 MG RE SUPP
10.0000 mg | Freq: Every day | RECTAL | Status: DC | PRN
  Administered 2016-06-15: 10 mg via RECTAL
  Filled 2016-06-15 (×2): qty 1

## 2016-06-15 MED ORDER — MIDAZOLAM HCL 50 MG/10ML IJ SOLN
INTRAMUSCULAR | Status: AC
  Filled 2016-06-15: qty 1

## 2016-06-15 NOTE — Plan of Care (Signed)
Problem: Respiratory: Goal: Ability to maintain a clear airway and adequate ventilation will improve Outcome: Progressing Vap prevention protocol continues. Pt tolerating sedation.

## 2016-06-15 NOTE — Progress Notes (Signed)
Marc A. [name], MD     [email or web address]          Marc Rose is an 66 y.o. male.   ASSESSMENT/PLAN:      Severe delirium/encephalopathy of unclear etiology at this time. We are suspecting that this may be multifactorial including UTI and medication effect from Levaquin. Levaquin has been discontinued. The picture seems most consistent with toxic metabolic encephalopathy/delirium as opposed to primary and CNS problem. The patient's spinal fluid analysis shows the typical formula with elevated protein and essentially unremarkable white cell count (cytoalbuminologic dissociation) seen in demyelinating conditions such as Guillain-Barr syndrome. The CSF does not show evidence of central nervous system infection. Attempted wean off the ventilator this weekend with associated with the patient becoming quite restless. I will place the patient on Haldol to help with his behavioral issues which she has had in the past. Will get EEG.   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 Completed 5 day course of IV IG    Low back pain status post L1-L2 laminectomy. HX of chronic discitis      Attempted wean this weekend. He is still on sedation but less today. There are episodes of hypotension yesterday.      The nurses and physician are report that the patient was following commands on yesterday. Unfortunately, he has had some hypotensive episodes.      GENERAL: He is intubated.   HEENT: Supple. Atraumatic normocephalic. Dense cataracts both sides.  ABDOMEN: soft  EXTREMITIES: No edema. Multiple abrasions and lacerations involving the knees and legs.   BACK: Normal.  SKIN: Normal by inspection.    MENTAL STATUS: The patient examined the a couple minutes after his sedation was held. He does open eyes to verbal commands and light sternal rub. He  does focuses and tracks. He does follow commands on both sides but more consistently on the right.  CRANIAL NERVES: Pupils are mid position and reactive. He seems to have full extraocular movements. Facial muscles are symmetric.   MOTOR: Moves both sides well.  COORDINATION: No tremor or dysmetria  REFLEXES: The patient is areflexic in the legs and upper extremities.      Blood pressure (!) 84/53, pulse 97, temperature 99.8 F (37.7 C), temperature source Axillary, resp. rate 14, height _0  (1.88 m), weight 165 lb 12.6 oz (75.2 kg), SpO2 100 %.  Past Medical History:  Diagnosis Date  . Anxiety state, unspecified   . Arthritis   . 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 Benign neoplasm of colon   . Causalgia of upper limb    Left limb  . Chronic back pain   . Chronic leg pain   . Depression   . Diabetes mellitus   . Diskitis 02/05/2015  . Drug rash 11/12/2015  . Elevated blood pressure reading without diagnosis of hypertension   . Essential hypertension, benign   . Hardware complicating wound infection ([place]) 02/05/2015  . Hyperpigmentation 05/15/2015  . Hypertension   . Impaired fasting glucose   . Peripheral arterial disease ([place])   . Pure hypercholesterolemia   . Tobacco use disorder   . Unspecified retinal detachment     Past Surgical History:  Procedure Laterality Date  . ANKLE SURGERY     left  . APPENDECTOMY    . BACK SURGERY    . COLONOSCOPY    . KNEE SURGERY     left  Family History  Problem Relation Age of Onset  . COPD Mother   . Hyperthyroidism Mother   . Hyperthyroidism Sister     Social History:  reports that he has been smoking Cigarettes.  He has a 19.50 pack-year smoking history. He has never used smokeless tobacco. He reports that he uses drugs, including Marijuana. He reports that he does not drink alcohol.  Allergies:  Allergies  Allergen Reactions  . Codeine Anaphylaxis, Hives and Swelling  . Penicillins Anaphylaxis, Hives and Swelling  . Latex  Itching  . Strawberry Extract Hives    Medications: Prior to Admission medications   Medication Sig Start Date End Date Taking? Authorizing Provider  aspirin EC 81 MG tablet Take 81 mg by mouth daily.   Yes Historical Provider, MD  benazepril (LOTENSIN) 40 MG tablet Take 40 mg by mouth daily.   Yes Historical Provider, MD  cyclobenzaprine (FLEXERIL) 10 MG tablet Take 1 tablet (10 mg total) by mouth 3 (three) times daily as needed for muscle spasms. 09/19/13  Yes Adeline Saralyn Pilar, MD  diazepam (VALIUM) 10 MG tablet Take 0.5 tablets (5 mg total) by mouth every 6 (six) hours as needed for anxiety (back spasms). Patient taking differently: Take 10 mg by mouth 4 (four) times daily as needed for anxiety (back spasms).  09/19/13  Yes Adeline Saralyn Pilar, MD  doxycycline (VIBRA-TABS) 100 MG tablet Take 1 tablet (100 mg total) by mouth 2 (two) times daily. 11/27/15  Yes Truman Hayward, MD  fluticasone [facility]) 50 MCG/ACT nasal spray Place 1 spray into the nose daily as needed for allergies.  07/04/14  Yes Historical Provider, MD  NEOMYCIN-POLYMYXIN-HYDROCORTISONE (CORTISPORIN) 1 % SOLN otic solution Place 2 drops in ear(s) daily. Reported on 11/12/2015 07/04/14  Yes Historical Provider, MD  pregabalin (LYRICA) 75 MG capsule Take 75 mg by mouth 2 (two) times daily.  05/07/13  Yes Historical Provider, MD  tamsulosin (FLOMAX) 0.4 MG CAPS capsule Take 0.4 mg by mouth.   Yes Historical Provider, MD    Scheduled Meds: . chlorhexidine gluconate (MEDLINE KIT)  15 mL Mouth Rinse BID  . Chlorhexidine Gluconate Cloth  6 each Topical Daily  . famotidine  20 mg Oral Daily  . haloperidol  5 mg Oral BID   Or  . haloperidol lactate  5 mg Intramuscular BID  . mouth rinse  15 mL Mouth Rinse QID  . pantoprazole (PROTONIX) IV  40 mg Intravenous Daily  . polyvinyl alcohol  1 drop Both Eyes TID  . senna-docusate  1 tablet Oral QHS  . sodium chloride flush  10-40 mL Intracatheter Q12H  . thiamine injection  100 mg  Intravenous Daily   Continuous Infusions: . feeding supplement (VITAL HIGH PROTEIN) 1,000 mL (06/15/16 0433)  . midazolam (VERSED) infusion 4 mg/hr (06/15/16 0525)  . phenylephrine (NEO-SYNEPHRINE) Adult infusion 35 mcg/min (06/15/16 0018)   PRN Meds:.acetaminophen **OR** acetaminophen, bisacodyl, fentaNYL (SUBLIMAZE) injection, haloperidol lactate, magnesium hydroxide, midazolam, midazolam, ondansetron **OR** ondansetron (ZOFRAN) IV, sodium chloride flush     Results for orders placed or performed during the hospital encounter of 06/03/16 (from the past 48 hour(s))  Glucose, capillary     Status: None   Collection Time: 06/13/16 11:30 AM  Result Value Ref Range   Glucose-Capillary 97 65 - 99 mg/dL   Comment 1 Notify RN    Comment 2 Document in Chart   Glucose, capillary     Status: None   Collection Time: 06/13/16  5:10 PM  Result Value  Ref Range   Glucose-Capillary 98 65 - 99 mg/dL  Glucose, capillary     Status: Abnormal   Collection Time: 06/13/16  8:00 PM  Result Value Ref Range   Glucose-Capillary 110 (H) 65 - 99 mg/dL  Glucose, capillary     Status: Abnormal   Collection Time: 06/14/16  2:26 AM  Result Value Ref Range   Glucose-Capillary 111 (H) 65 - 99 mg/dL  Blood gas, arterial     Status: Abnormal   Collection Time: 06/14/16  4:25 AM  Result Value Ref Range   FIO2 0.30    O2 Content 30.0 L/min   Delivery systems VENTILATOR    Mode PRESSURE REGULATED VOLUME CONTROL    VT 550 mL   LHR 14 resp/min   Peep/cpap 5.0 cm H20   pH, Arterial 7.449 7.350 - 7.450   pCO2 arterial 40.7 32.0 - 48.0 mmHg   pO2, Arterial 97.8 83.0 - 108.0 mmHg   Bicarbonate 27.9 20.0 - 28.0 mmol/L   Acid-Base Excess 3.9 (H) 0.0 - 2.0 mmol/L   O2 Saturation 97.1 %   Collection site RIGHT RADIAL    Drawn by [id number]    Sample type ARTERIAL DRAW    Allens test (pass/fail) PASS PASS  Basic metabolic panel     Status: Abnormal   Collection Time: 06/14/16  4:34 AM  Result Value Ref Range    Sodium 140 135 - 145 mmol/L   Potassium 3.4 (L) 3.5 - 5.1 mmol/L   Chloride 105 101 - 111 mmol/L   CO2 29 22 - 32 mmol/L   Glucose, Bld 137 (H) 65 - 99 mg/dL   BUN 25 (H) 6 - 20 mg/dL   Creatinine, Ser 0.72 0.61 - 1.24 mg/dL   Calcium 8.3 (L) 8.9 - 10.3 mg/dL   GFR calc non Af Amer >60 >60 mL/min   GFR calc Af Amer >60 >60 mL/min    Comment: (NOTE) The eGFR has been calculated using the CKD EPI equation. This calculation has not been validated in all clinical situations. eGFR's persistently <60 mL/min signify possible Chronic Kidney Disease.    Anion gap 6 5 - 15  CBC     Status: Abnormal   Collection Time: 06/14/16  4:34 AM  Result Value Ref Range   WBC 7.8 4.0 - 10.5 K/uL   RBC 3.39 (L) 4.22 - 5.81 MIL/uL   Hemoglobin 11.0 (L) 13.0 - 17.0 g/dL   HCT 32.4 (L) 39.0 - 52.0 %   MCV 95.6 78.0 - 100.0 fL   MCH 32.4 26.0 - 34.0 pg   MCHC 34.0 30.0 - 36.0 g/dL   RDW 16.4 (H) 11.5 - 15.5 %   Platelets 304 150 - 400 K/uL  Glucose, capillary     Status: Abnormal   Collection Time: 06/14/16  4:43 AM  Result Value Ref Range   Glucose-Capillary 118 (H) 65 - 99 mg/dL  Glucose, capillary     Status: Abnormal   Collection Time: 06/14/16  7:57 AM  Result Value Ref Range   Glucose-Capillary 115 (H) 65 - 99 mg/dL  Glucose, capillary     Status: Abnormal   Collection Time: 06/14/16 11:06 AM  Result Value Ref Range   Glucose-Capillary 143 (H) 65 - 99 mg/dL  Glucose, capillary     Status: Abnormal   Collection Time: 06/14/16  4:27 PM  Result Value Ref Range   Glucose-Capillary 101 (H) 65 - 99 mg/dL  Glucose, capillary     Status: Abnormal  Collection Time: 06/14/16  8:08 PM  Result Value Ref Range   Glucose-Capillary 126 (H) 65 - 99 mg/dL   Comment 1 Notify RN   Glucose, capillary     Status: Abnormal   Collection Time: 06/15/16 12:09 AM  Result Value Ref Range   Glucose-Capillary 127 (H) 65 - 99 mg/dL   Comment 1 Notify RN   Glucose, capillary     Status: Abnormal   Collection  Time: 06/15/16  4:09 AM  Result Value Ref Range   Glucose-Capillary 104 (H) 65 - 99 mg/dL   Comment 1 Notify RN   Comprehensive metabolic panel     Status: Abnormal   Collection Time: 06/15/16  4:23 AM  Result Value Ref Range   Sodium 140 135 - 145 mmol/L   Potassium 3.9 3.5 - 5.1 mmol/L   Chloride 107 101 - 111 mmol/L   CO2 30 22 - 32 mmol/L   Glucose, Bld 120 (H) 65 - 99 mg/dL   BUN 34 (H) 6 - 20 mg/dL   Creatinine, Ser 0.72 0.61 - 1.24 mg/dL   Calcium 8.3 (L) 8.9 - 10.3 mg/dL   Total Protein 6.7 6.5 - 8.1 g/dL   Albumin 2.3 (L) 3.5 - 5.0 g/dL   AST 56 (H) 15 - 41 U/L   ALT 53 17 - 63 U/L   Alkaline Phosphatase 101 38 - 126 U/L   Total Bilirubin 0.6 0.3 - 1.2 mg/dL   GFR calc non Af Amer >60 >60 mL/min   GFR calc Af Amer >60 >60 mL/min    Comment: (NOTE) The eGFR has been calculated using the CKD EPI equation. This calculation has not been validated in all clinical situations. eGFR's persistently <60 mL/min signify possible Chronic Kidney Disease.    Anion gap 3 (L) 5 - 15  CBC     Status: Abnormal   Collection Time: 06/15/16  4:23 AM  Result Value Ref Range   WBC 9.7 4.0 - 10.5 K/uL   RBC 3.22 (L) 4.22 - 5.81 MIL/uL   Hemoglobin 10.5 (L) 13.0 - 17.0 g/dL   HCT 31.1 (L) 39.0 - 52.0 %   MCV 96.6 78.0 - 100.0 fL   MCH 32.6 26.0 - 34.0 pg   MCHC 33.8 30.0 - 36.0 g/dL   RDW 16.5 (H) 11.5 - 15.5 %   Platelets 329 150 - 400 K/uL  Blood gas, arterial     Status: Abnormal   Collection Time: 06/15/16  5:40 AM  Result Value Ref Range   FIO2 30.00    Delivery systems VENTILATOR    Mode PRESSURE REGULATED VOLUME CONTROL    VT 550 mL   LHR 14.0 resp/min   Peep/cpap 5.0 cm H20   pH, Arterial 7.462 (H) 7.350 - 7.450   pCO2 arterial 41.5 32.0 - 48.0 mmHg   pO2, Arterial 94.6 83.0 - 108.0 mmHg   Bicarbonate 29.2 (H) 20.0 - 28.0 mmol/L   Acid-Base Excess 5.4 (H) 0.0 - 2.0 mmol/L   O2 Saturation 97.2 %   Patient temperature 37.0    Collection site RIGHT RADIAL    Drawn by  [id number]    Sample type ARTERIAL    Allens test (pass/fail) PASS PASS  Glucose, capillary     Status: Abnormal   Collection Time: 06/15/16  7:42 AM  Result Value Ref Range   Glucose-Capillary 132 (H) 65 - 99 mg/dL    Studies/Results:  CTA LUNG IMPRESSION: Manubrial fracture  No evidence of pulmonary emboli.  Left  lower lobe infiltrate and effusion.     BRAIN MRI FINDINGS: Brain: Cerebral volume is within normal limits for age. No restricted diffusion to suggest acute infarction. No midline shift, mass effect, evidence of mass lesion, ventriculomegaly, extra-axial collection or acute intracranial hemorrhage. Cervicomedullary junction and pituitary are within normal limits.  Pearline Cables and white matter signal is within normal limits for age throughout the brain. No cortical encephalomalacia or chronic cerebral blood products.  Vascular: Major intracranial vascular flow voids are preserved.  Skull and upper cervical spine: Negative. Normal bone marrow signal.  Sinuses/Orbits: Normal orbits soft tissues. Trace paranasal sinus mucosal thickening.  Other: Visible internal auditory structures appear normal. Mastoids are clear. Negative scalp soft tissues.  IMPRESSION: No acute intracranial abnormality. Normal for age noncontrast MRI appearance of the brain.            L SPINE MRI 2015 FINDINGS: Trace anterolisthesis of L3 on L4 and L4 on L5 is unchanged. There is grade 1 retrolisthesis of L1 on L2, stable to minimally increased from prior MRI. New from the prior MRI is a mild compression deformity involving the L2 superior endplate with approximately 10% vertebral body height loss. There is abnormal fluid signal within the L1-2 disc space, and there is also new edema throughout the L1 and L2 vertebral bodies. There is paraspinal inflammatory change/ phlegmon at this level, and there is a 1.9 x 1.3 cm fluid collection in the left psoas muscle at the L2  level (series 5, image 16).  Advanced disc space height loss at L5-S1 is unchanged, however there is increased fluid signal within the disc space compared to the prior MRI with mildly increased marrow edema within the adjacent L5 and S1 vertebral bodies. The conus medullaris is normal in signal and terminates at the superior aspect of L2.  T11-12: Only imaged sagittally. Facet hypertrophy results in mild bilateral neural foraminal narrowing, right greater than left and unchanged.  T12-L1:  Negative.  L1-2: Sequelae of prior laminectomy and posterior fusion are again identified. Posterior retropulsion of the superior L2 endplate, greater on the right, with possibly small adjacent epidural fluid collection, results in new narrowing of the right lateral recess and increased right neural foraminal narrowing. No spinal canal stenosis.  L2-3: Mild disc bulge and facet hypertrophy result in mild right lateral recess and neural foraminal narrowing, unchanged.  L3-4: Mild disc bulge and moderate facet hypertrophy result in mild right neural foraminal narrowing, unchanged. No spinal canal stenosis.  L4-5: Left foraminal disc protrusion and mild-to-moderate facet hypertrophy result in mild to moderate left neural foraminal stenosis, unchanged. No spinal canal stenosis.  L5-S1: Disc bulge and mild facet hypertrophy result in mild to moderate bilateral neural foraminal stenosis, unchanged. No spinal canal stenosis.  IMPRESSION: 1. Changes at L1-2 as above concerning for discitis/osteomyelitis with small left psoas abscess. New, mild compression deformity of the L2 superior endplate and mild retropulsion results in new right lateral recess narrowing and increased right neural foraminal narrowing at L1-2. Small ventral epidural fluid collection is not excluded on the right. 2. Advanced degenerative disc disease at L5-S1 with a small amount of new fluid signal in the disc space  and mildly increased adjacent marrow changes. Early discitis/osteomyelitis is not excluded.       Taysean Wager A. Merlene Rose, M.D.  Diplomate, [employer] adviser of Psychiatry and Neurology ( Neurology). 06/15/2016, 9:23 AM

## 2016-06-15 NOTE — Progress Notes (Signed)
EEG Completed; Results Pending  

## 2016-06-15 NOTE — Progress Notes (Signed)
Subjective: Events of yesterday noted. He is on pressors again. Apparently yesterday he was awake and able to follow commands. He weaned for a little bit yesterday.  Objective: Vital signs in last 24 hours: Temp:  [97.9 F (36.6 C)-99.8 F (37.7 C)] 99.8 F (37.7 C) (02/13 0744) Pulse Rate:  [88-126] 97 (02/13 0744) Resp:  [12-32] 14 (02/13 0744) BP: (55-152)/(40-94) 84/53 (02/13 0736) SpO2:  [100 %] 100 % (02/13 0744) FiO2 (%):  [30 %] 30 % (02/13 0736) Weight:  [75.2 kg (165 lb 12.6 oz)] 75.2 kg (165 lb 12.6 oz) (02/13 0450) Weight change: 1.5 kg (3 lb 4.9 oz) Last BM Date: 06/08/16 (MD Memon notified )  Intake/Output from previous day: 02/12 0701 - 02/13 0700 In: 1195.8 [I.V.:243.8; NG/GT:952] Out: 1200 [Urine:1200]  PHYSICAL EXAM General appearance: Intubated sedated on mechanical ventilation Resp: rhonchi bilaterally Cardio: regular rate and rhythm, S1, S2 normal, no murmur, click, rub or gallop GI: soft, non-tender; bowel sounds normal; no masses,  no organomegaly Extremities: extremities normal, atraumatic, no cyanosis or edema Skin warm and dry. Mucous membranes are moist  Lab Results:  Results for orders placed or performed during the hospital encounter of 06/03/16 (from the past 48 hour(s))  Glucose, capillary     Status: None   Collection Time: 06/13/16 11:30 AM  Result Value Ref Range   Glucose-Capillary 97 65 - 99 mg/dL   Comment 1 Notify RN    Comment 2 Document in Chart   Glucose, capillary     Status: None   Collection Time: 06/13/16  5:10 PM  Result Value Ref Range   Glucose-Capillary 98 65 - 99 mg/dL  Glucose, capillary     Status: Abnormal   Collection Time: 06/13/16  8:00 PM  Result Value Ref Range   Glucose-Capillary 110 (H) 65 - 99 mg/dL  Glucose, capillary     Status: Abnormal   Collection Time: 06/14/16  2:26 AM  Result Value Ref Range   Glucose-Capillary 111 (H) 65 - 99 mg/dL  Blood gas, arterial     Status: Abnormal   Collection Time:  06/14/16  4:25 AM  Result Value Ref Range   FIO2 0.30    O2 Content 30.0 L/min   Delivery systems VENTILATOR    Mode PRESSURE REGULATED VOLUME CONTROL    VT 550 mL   LHR 14 resp/min   Peep/cpap 5.0 cm H20   pH, Arterial 7.449 7.350 - 7.450   pCO2 arterial 40.7 32.0 - 48.0 mmHg   pO2, Arterial 97.8 83.0 - 108.0 mmHg   Bicarbonate 27.9 20.0 - 28.0 mmol/L   Acid-Base Excess 3.9 (H) 0.0 - 2.0 mmol/L   O2 Saturation 97.1 %   Collection site RIGHT RADIAL    Drawn by [id number]    Sample type ARTERIAL DRAW    Allens test (pass/fail) PASS PASS  Basic metabolic panel     Status: Abnormal   Collection Time: 06/14/16  4:34 AM  Result Value Ref Range   Sodium 140 135 - 145 mmol/L   Potassium 3.4 (L) 3.5 - 5.1 mmol/L   Chloride 105 101 - 111 mmol/L   CO2 29 22 - 32 mmol/L   Glucose, Bld 137 (H) 65 - 99 mg/dL   BUN 25 (H) 6 - 20 mg/dL   Creatinine, Ser 0.72 0.61 - 1.24 mg/dL   Calcium 8.3 (L) 8.9 - 10.3 mg/dL   GFR calc non Af Amer >60 >60 mL/min   GFR calc Af Amer >60 >60 mL/min  Comment: (NOTE) The eGFR has been calculated using the CKD EPI equation. This calculation has not been validated in all clinical situations. eGFR's persistently <60 mL/min signify possible Chronic Kidney Disease.    Anion gap 6 5 - 15  CBC     Status: Abnormal   Collection Time: 06/14/16  4:34 AM  Result Value Ref Range   WBC 7.8 4.0 - 10.5 K/uL   RBC 3.39 (L) 4.22 - 5.81 MIL/uL   Hemoglobin 11.0 (L) 13.0 - 17.0 g/dL   HCT 32.4 (L) 39.0 - 52.0 %   MCV 95.6 78.0 - 100.0 fL   MCH 32.4 26.0 - 34.0 pg   MCHC 34.0 30.0 - 36.0 g/dL   RDW 16.4 (H) 11.5 - 15.5 %   Platelets 304 150 - 400 K/uL  Glucose, capillary     Status: Abnormal   Collection Time: 06/14/16  4:43 AM  Result Value Ref Range   Glucose-Capillary 118 (H) 65 - 99 mg/dL  Glucose, capillary     Status: Abnormal   Collection Time: 06/14/16  7:57 AM  Result Value Ref Range   Glucose-Capillary 115 (H) 65 - 99 mg/dL  Glucose, capillary      Status: Abnormal   Collection Time: 06/14/16 11:06 AM  Result Value Ref Range   Glucose-Capillary 143 (H) 65 - 99 mg/dL  Glucose, capillary     Status: Abnormal   Collection Time: 06/14/16  4:27 PM  Result Value Ref Range   Glucose-Capillary 101 (H) 65 - 99 mg/dL  Glucose, capillary     Status: Abnormal   Collection Time: 06/14/16  8:08 PM  Result Value Ref Range   Glucose-Capillary 126 (H) 65 - 99 mg/dL   Comment 1 Notify RN   Glucose, capillary     Status: Abnormal   Collection Time: 06/15/16 12:09 AM  Result Value Ref Range   Glucose-Capillary 127 (H) 65 - 99 mg/dL   Comment 1 Notify RN   Glucose, capillary     Status: Abnormal   Collection Time: 06/15/16  4:09 AM  Result Value Ref Range   Glucose-Capillary 104 (H) 65 - 99 mg/dL   Comment 1 Notify RN   Comprehensive metabolic panel     Status: Abnormal   Collection Time: 06/15/16  4:23 AM  Result Value Ref Range   Sodium 140 135 - 145 mmol/L   Potassium 3.9 3.5 - 5.1 mmol/L   Chloride 107 101 - 111 mmol/L   CO2 30 22 - 32 mmol/L   Glucose, Bld 120 (H) 65 - 99 mg/dL   BUN 34 (H) 6 - 20 mg/dL   Creatinine, Ser 0.72 0.61 - 1.24 mg/dL   Calcium 8.3 (L) 8.9 - 10.3 mg/dL   Total Protein 6.7 6.5 - 8.1 g/dL   Albumin 2.3 (L) 3.5 - 5.0 g/dL   AST 56 (H) 15 - 41 U/L   ALT 53 17 - 63 U/L   Alkaline Phosphatase 101 38 - 126 U/L   Total Bilirubin 0.6 0.3 - 1.2 mg/dL   GFR calc non Af Amer >60 >60 mL/min   GFR calc Af Amer >60 >60 mL/min    Comment: (NOTE) The eGFR has been calculated using the CKD EPI equation. This calculation has not been validated in all clinical situations. eGFR's persistently <60 mL/min signify possible Chronic Kidney Disease.    Anion gap 3 (L) 5 - 15  CBC     Status: Abnormal   Collection Time: 06/15/16  4:23 AM  Result  Value Ref Range   WBC 9.7 4.0 - 10.5 K/uL   RBC 3.22 (L) 4.22 - 5.81 MIL/uL   Hemoglobin 10.5 (L) 13.0 - 17.0 g/dL   HCT 31.1 (L) 39.0 - 52.0 %   MCV 96.6 78.0 - 100.0 fL   MCH  32.6 26.0 - 34.0 pg   MCHC 33.8 30.0 - 36.0 g/dL   RDW 16.5 (H) 11.5 - 15.5 %   Platelets 329 150 - 400 K/uL  Blood gas, arterial     Status: Abnormal   Collection Time: 06/15/16  5:40 AM  Result Value Ref Range   FIO2 30.00    Delivery systems VENTILATOR    Mode PRESSURE REGULATED VOLUME CONTROL    VT 550 mL   LHR 14.0 resp/min   Peep/cpap 5.0 cm H20   pH, Arterial 7.462 (H) 7.350 - 7.450   pCO2 arterial 41.5 32.0 - 48.0 mmHg   pO2, Arterial 94.6 83.0 - 108.0 mmHg   Bicarbonate 29.2 (H) 20.0 - 28.0 mmol/L   Acid-Base Excess 5.4 (H) 0.0 - 2.0 mmol/L   O2 Saturation 97.2 %   Patient temperature 37.0    Collection site RIGHT RADIAL    Drawn by [phone]    Sample type ARTERIAL    Allens test (pass/fail) PASS PASS  Glucose, capillary     Status: Abnormal   Collection Time: 06/15/16  7:42 AM  Result Value Ref Range   Glucose-Capillary 132 (H) 65 - 99 mg/dL    ABGS  Recent Labs  06/15/16 0540  PHART 7.462*  PO2ART 94.6  HCO3 29.2*   CULTURES Recent Results (from the past 240 hour(s))  MRSA PCR Screening     Status: None   Collection Time: 06/09/16  5:00 PM  Result Value Ref Range Status   MRSA by PCR NEGATIVE NEGATIVE Final    Comment:        The GeneXpert MRSA Assay (FDA approved for NASAL specimens only), is one component of a comprehensive MRSA colonization surveillance program. It is not intended to diagnose MRSA infection nor to guide or monitor treatment for MRSA infections.   Culture, blood (routine x 2)     Status: None   Collection Time: 06/09/16  7:15 PM  Result Value Ref Range Status   Specimen Description BLOOD RIGHT HAND  Final   Special Requests BOTTLES DRAWN AEROBIC AND ANAEROBIC 6CC  Final   Culture NO GROWTH 6 DAYS  Final   Report Status 06/15/2016 FINAL  Final  Culture, blood (routine x 2)     Status: None   Collection Time: 06/09/16  7:20 PM  Result Value Ref Range Status   Specimen Description BLOOD LEFT HAND  Final   Special Requests  BOTTLES DRAWN AEROBIC AND ANAEROBIC 6CC  Final   Culture NO GROWTH 6 DAYS  Final   Report Status 06/15/2016 FINAL  Final  CSF culture     Status: None   Collection Time: 06/10/16  8:00 AM  Result Value Ref Range Status   Specimen Description CSF  Final   Special Requests Normal  Final   Gram Stain   Final    NO ORGANISMS SEEN NO WBC SEEN CYTOSPIN SMEAR Gram Stain Report Called to,Read Back By and Verified With: WILLIE E. AT [id number] ON [id number] BY THOMPSON S.    Culture   Final    NO GROWTH 3 DAYS Performed at [facility], [place] [address] [place], [place] [address]    Report Status 06/13/2016 FINAL  Final   Studies/Results: US Venous Img Upper Uni Left  Result Date: 06/13/2016 CLINICAL DATA:  Left upper extremity edema. EXAM: LEFT UPPER EXTREMITY VENOUS DOPPLER ULTRASOUND TECHNIQUE: Gray-scale sonography with graded compression, as well as color Doppler and duplex ultrasound were performed to evaluate the upper extremity deep venous system from the level of the subclavian vein and including the jugular, axillary, basilic, radial, ulnar and upper cephalic vein. Spectral Doppler was utilized to evaluate flow at rest and with distal augmentation maneuvers. COMPARISON:  None. FINDINGS: Contralateral Subclavian Vein: Respiratory phasicity is normal and symmetric with the symptomatic side. No evidence of thrombus. Normal compressibility. Internal Jugular Vein: No evidence of thrombus. Normal compressibility, respiratory phasicity and response to augmentation. Subclavian Vein: No evidence of thrombus. Normal compressibility, respiratory phasicity and response to augmentation. Axillary Vein: No evidence of thrombus. Normal compressibility, respiratory phasicity and response to augmentation. Cephalic Vein: No evidence of thrombus. Normal compressibility, respiratory phasicity and response to augmentation. Basilic Vein: No evidence of thrombus. Normal compressibility, respiratory phasicity and  response to augmentation. Brachial Veins: No evidence of thrombus. Normal compressibility, respiratory phasicity and response to augmentation. Radial Veins: No evidence of thrombus. Normal compressibility, respiratory phasicity and response to augmentation. Ulnar Veins: No evidence of thrombus. Normal compressibility, respiratory phasicity and response to augmentation. Venous Reflux:  None visualized. Other Findings: No evidence of superficial thrombophlebitis or abnormal fluid collection. IMPRESSION: No evidence of upper extra deep venous thrombosis. Electronically Signed   By: Aletta Edouard M.D.   On: 06/13/2016 10:06   Dg Chest Port 1 View  Result Date: 06/15/2016 CLINICAL DATA:  Ventilation. EXAM: PORTABLE CHEST 1 VIEW COMPARISON:  06/14/2016. FINDINGS: Endotracheal tube, NG tube, and right PICC line in stable position. Heart size normal. Lungs are clear. No pleural effusion or pneumothorax. Prior thoracolumbar spine fusion. IMPRESSION: 1. Lines and tubes in stable position. 2. No acute cardiopulmonary disease. Electronically Signed   By: Marcello Moores  Register   On: 06/15/2016 07:18   Dg Chest Port 1 View  Result Date: 06/14/2016 CLINICAL DATA:  Respiratory failure EXAM: PORTABLE CHEST 1 VIEW COMPARISON:  06/13/2016 FINDINGS: Cardiomediastinal silhouette is stable. Again noted mild perihilar interstitial prominence suspicious for mild interstitial edema. Endotracheal tube is unchanged in position. No segmental infiltrate. Stable right arm PICC line position. NG tube with tip in proximal stomach. IMPRESSION: Again noted mild perihilar interstitial prominence suspicious for mild interstitial edema. Endotracheal tube and NG tube is unchanged in position. No segmental infiltrate. Stable right arm PICC line position. NG tube with tip in proximal stomach. Electronically Signed   By: Lahoma Crocker M.D.   On: 06/14/2016 09:42    Medications:  Prior to Admission:  Prescriptions Prior to Admission  Medication Sig  Dispense Refill Last Dose  . aspirin EC 81 MG tablet Take 81 mg by mouth daily.   Past Week at Unknown time  . benazepril (LOTENSIN) 40 MG tablet Take 40 mg by mouth daily.   Past Week at Unknown time  . cyclobenzaprine (FLEXERIL) 10 MG tablet Take 1 tablet (10 mg total) by mouth 3 (three) times daily as needed for muscle spasms. 30 tablet 0 Past Week at Unknown time  . diazepam (VALIUM) 10 MG tablet Take 0.5 tablets (5 mg total) by mouth every 6 (six) hours as needed for anxiety (back spasms). (Patient taking differently: Take 10 mg by mouth 4 (four) times daily as needed for anxiety (back spasms). ) 30 tablet 0 Past Week at Unknown time  . doxycycline (VIBRA-TABS) 100  MG tablet Take 1 tablet (100 mg total) by mouth 2 (two) times daily. 60 tablet 11 Past Week at Unknown time  . fluticasone (FLONASE) 50 MCG/ACT nasal spray Place 1 spray into the nose daily as needed for allergies.    unknown  . NEOMYCIN-POLYMYXIN-HYDROCORTISONE (CORTISPORIN) 1 % SOLN otic solution Place 2 drops in ear(s) daily. Reported on 11/12/2015   Past Week at Unknown time  . pregabalin (LYRICA) 75 MG capsule Take 75 mg by mouth 2 (two) times daily.    Past Week at Unknown time  . tamsulosin (FLOMAX) 0.4 MG CAPS capsule Take 0.4 mg by mouth.   Past Week at Unknown time   Scheduled: . chlorhexidine gluconate (MEDLINE KIT)  15 mL Mouth Rinse BID  . Chlorhexidine Gluconate Cloth  6 each Topical Daily  . famotidine  20 mg Oral Daily  . haloperidol  5 mg Oral BID   Or  . haloperidol lactate  5 mg Intramuscular BID  . mouth rinse  15 mL Mouth Rinse QID  . pantoprazole (PROTONIX) IV  40 mg Intravenous Daily  . polyvinyl alcohol  1 drop Both Eyes TID  . senna-docusate  1 tablet Oral QHS  . sodium chloride flush  10-40 mL Intracatheter Q12H  . thiamine injection  100 mg Intravenous Daily   Continuous: . feeding supplement (VITAL HIGH PROTEIN) 1,000 mL (06/15/16 0433)  . midazolam (VERSED) infusion 4 mg/hr (06/15/16 0525)  .  phenylephrine (NEO-SYNEPHRINE) Adult infusion 35 mcg/min (06/15/16 0018)   PIR:[id number] **OR** acetaminophen, fentaNYL (SUBLIMAZE) injection, haloperidol lactate, magnesium hydroxide, midazolam, midazolam, ondansetron **OR** ondansetron (ZOFRAN) IV, sodium chloride flush  Assesment: He was admitted with urinary tract infection and hypovolemic shock. He has [name] variant of Guillain-Barr syndrome. He had acute change in mental status and was intubated. It's not clear if he has any lung disease at baseline. He has multiple other medical problems including chronic disc infection. He is on chronic antibiotics. He has been hypotensive again and is back on pressors so we don't really have any room to attempt extubation today. He has finished his IVIG. He was better yesterday as far as his mental status was concerned but he is  sedated this morning.CXR which I personally reviewed does not show pneumonia Principal Problem:   UTI (urinary tract infection) due to Enterococcus Active Problems:   HTN (hypertension)   Abdominal pain, generalized   Long term current use of antibiotics   Back pain, chronic   Discitis of lumbar region   Nerve disease, peripheral (HCC)   Neurogenic urinary bladder disorder   AKI (acute kidney injury) ([place])   Ataxia   Muscle weakness (generalized)   Ataxic gait   Tachycardia   Miller-Fisher variant Guillain-Barre syndrome (HCC)   Acute respiratory failure (HCC)   Hypovolemic shock (HCC)   Malnutrition of moderate degree    Plan: Continue current treatments    LOS: 12 days   Quintara Bost L 06/15/2016, 8:36 AM

## 2016-06-15 NOTE — Progress Notes (Signed)
Nutrition Follow-up  DOCUMENTATION CODES:  Non-severe (moderate) malnutrition in context of acute illness/injury   Pt meets criteria for MODERATE MALNUTRITION  INTERVENTION:  Change TF formula to Vital AF 1.2 and begin at goal rate 65 ml/h (1560  ml per day) 1872  kcals, 117 gm protein, 1265 ml free water daily.  PEPup protocol  NUTRITION DIAGNOSIS:  Inadequate oral intake related to inability to eat as evidenced by NPO status.  GOAL:  Patient will meet greater than or equal to 90% of their needs  MONITOR:  Diet advancement, Vent status, Labs, TF tolerance, I & O's    ASSESSMENT:  66 y/o male PMhx DM, PAD, HTN, Depression/Anxiety, Tobacco abuse, urinary retention. Originally had presented due to BLE weakness that had resulted in falls and traumatic skin lesions. Worked up for UTI, AKI. 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  Mental status worsened and had difficulty protecting airway. Intubated 2/7.   Interval hx since 2/9: Failed to wean.  Repeat Cxrs do not show pna. Hypotensive, Propofol now off, back on pressors.    pt's po intake had been very good up until roughly 2/5-2/6 when he began refusing to eat 1 of his meals each day (ate >50% other 2 meals). Last meal Dinner 2/6.   On assessment today, pt is alert. Versed at low rate. Phenylephrine is paused.  Pt's abdomen is non distended, though. Per documentation, has not had BM in 1 week. RN states pt will be given suppository.   Patient is currently intubated on ventilator support MV: 7.5 L/min Temp (24hrs), Avg:98.8 F (37.1 C), Min:97.9 F (36.6 C), Max:99.8 F (37.7 C)  Propofol:  Off  Using weights from "[facility]". Appeared to weigh 175 lbs this past October with 10 lbs loss   Labs:, Afebrile, BG 100-140 mg/dl, Albumin: 2.3,  Medications: Pepcid, PPI,  Senna, Thiamin, Versed, Phen-epi  Recent Labs Lab 06/13/16 0450 06/14/16 0434 06/15/16 0423  NA  137 140 140  K 3.4* 3.4* 3.9  CL 108 105 107  CO2 27 29 30   BUN 16 25* 34*  CREATININE 0.66 0.72 0.72  CALCIUM 7.8* 8.3* 8.3*  GLUCOSE 131* 137* 120*   Diet Order:  Diet NPO time specified  Skin: Scattered abrasions to arms, knees, legs. Skin Pale  Last BM:  2/6  Height:  Ht Readings from Last 1 Encounters:  06/03/16 6\' 2"  (1.88 m)   Weight:  Wt Readings from Last 1 Encounters:  06/15/16 165 lb 12.6 oz (75.2 kg)   Wt Readings from Last 10 Encounters:  06/15/16 165 lb 12.6 oz (75.2 kg)  05/17/16 165 lb (74.8 kg)  01/21/16 175 lb (79.4 kg)  11/12/15 178 lb (80.7 kg)  05/15/15 187 lb 8 oz (85 kg)  02/05/15 179 lb (81.2 kg)  07/05/14 169 lb 6.4 oz (76.8 kg)  09/18/13 141 lb 8.6 oz (64.2 kg)  08/24/13 155 lb (70.3 kg)  06/05/13 162 lb (73.5 kg)  Dosing weight: 160 lbs 1 oz  Ideal Body Weight:  86.36 kg  BMI:  Body mass index using dosing weight is 20.6kg/m.  Estimated Nutritional Needs:  Kcal:  1840 kcals Protein:  110-130 g (1.5-1.8 g/kg bw) Fluid:  >2.1 L liters  EDUCATION NEEDS:  No education needs identified at this time  Burtis Junes RD, LDN, [facility] Pager: [phone] 06/15/2016 3:35 PM

## 2016-06-15 NOTE — Evaluation (Addendum)
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 He has had multiple surgeries on his left lower extremity. Patient has been on chronic doxycycline for chronic discitis and osteomyelitis since 2015. Patient has a urine analysis positive for infection from January 26 with culture positive for Enterococcus faecalis.  Dx: UTI, mild lactic acidosis without sepsis, 71mm R upper lung nodule, acute encephalopathy with mental status significantly declining on 06/09/2016 possibly due to UTI or adverse effect of Levaquin.  Hypovolemic shock.  Acute respiratory failure and intubation for airway protection.  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 Completed 5 day course of IV IG  Clinical Impression  Pt received in bed, and was agreeable to PT evaluation.  Pt remains intubated at this time with PEEP: 5, FiO2 at 30%, RR at 14, and tidal volume at 550.  He normally lives alone and is modified independent with all functional mobility tasks with a cane.  He is normally independent with dressing and bathing.  He has multiple neighbors that check in on him.  During PT evaluation he continues to demonstrate ataxia in all 4 extremities, and severe truncal weakness.  He was able to sit on the EOB with Max A +2.  He was able to perform some LE exercises, as well as balance, and postural exercises.  He follows commands with increased time and repetition, and does not always move the correct limb to complete  the request.  At this point, pt remains severely impaired with all functional mobility tasks, and is not appropriate to d/c home.  He will need to continue his rehabilitation at [facility] vs SNF.  Discussed this with the patient, and he is in agreement.   He is also strongly recommended for OT evaluation to progress with ADL's and upper body strengthening.      Follow Up Recommendations LTACH;SNF;Supervision/Assistance - 24 hour    Equipment Recommendations  None recommended by PT    Recommendations for Other Services OT consult     Precautions / Restrictions Precautions Precautions: Fall Precaution Comments: 100 in the past 6 months.  L knee is popping in and out.  Pt is very ataxic  Restrictions Weight Bearing Restrictions: No Other Position/Activity Restrictions: PEG tube, ventillator support, B mitts      Mobility  Bed Mobility Overal bed mobility: Needs Assistance Bed Mobility: Supine to Sit     Supine to sit: +2 for physical assistance;+2 for safety/equipment;Max assist Sit to supine: Total assist;+2 for physical assistance;+2 for safety/equipment   General bed mobility comments: Pt demonstrates poor trunk initiation and poor UE initiation to sit on the EOB.  Pt was able to sit on the EOB for `8 minutes and perform LE exercises and seated balance and posture exercises.    Transfers                    Ambulation/Gait                Stairs            Wheelchair Mobility    Modified Rankin (Stroke Patients Only)  Balance Overall balance assessment: History of Falls;Needs assistance Sitting-balance support: Bilateral upper extremity supported;Feet supported Sitting balance-Leahy Scale: Zero Sitting balance - Comments: Pt requires Max A for static sitting balance due to poor trunk control.  Pt was able to perform posture exercises x 10 reps to lift head up and perform scapular retraction.  x 2 reps each for lateral weight shifting.                                        Pertinent Vitals/Pain      Home Living   Living Arrangements: Alone Available Help at Discharge: Neighbor Type of Home: House Home Access: Stairs to enter   [employer] of Steps: 2 steps, 7 steps at the back Home Layout: One level Home Equipment: Cane - single point;Bedside commode;Shower seat;Wheelchair - [employer] - 2 wheels      Prior Function Level of Independence: Independent with assistive device(s)   Gait / Transfers Assistance Needed: pt states he normally uses his cane or a piece of PVC pipe for ambulation.   ADL's / Homemaking Assistance Needed: independent with dressing and bathing.  Driving and is able to go run errands.          Hand Dominance   Dominant Hand: Right    Extremity/Trunk Assessment   Upper Extremity Assessment Upper Extremity Assessment: Generalized weakness;RUE deficits/detail;LUE deficits/detail RUE Deficits / Details: Pt demonstrates delayed response for all requested muscle contraction, and continues to demonstrate ataxia.  3/5 for shoulder shrug, 2/5 shoulder flexion and elbow extension, with weak grip.  RUE Sensation: decreased proprioception RUE Coordination: decreased gross motor;decreased fine motor LUE Deficits / Details: Pt demonstrates delayed response for all requested muscle contraction, and continues to demonstrate ataxia.  3/5 for shoulder shrug, 2/5 shoulder flexion and elbow extension, with weak grip.     Lower Extremity Assessment Lower Extremity Assessment: Generalized weakness;RLE deficits/detail;LLE deficits/detail RLE Deficits / Details: Pt demonstrates delayed response for all requested muscle contraction, and continues to demonstrate ataxia.  3/5 knee extension, 3/5 hip flexion RLE Sensation: decreased proprioception RLE Coordination: decreased fine motor;decreased gross motor LLE Deficits / Details: Pt demonstrates delayed response for all requested muscle  contraction, and continues to demonstrate ataxia.  3/5 knee extension, 3/5 hip flexion LLE Sensation: decreased proprioception LLE Coordination: decreased fine motor;decreased gross motor    Cervical / Trunk Assessment Cervical / Trunk Assessment: Kyphotic  Communication   Communication: Other (comment) (Pt is currently intubated, but can answer yes/no questions well with nodding and facial expressions. )  Cognition Arousal/Alertness: Awake/alert Behavior During Therapy: WFL for tasks assessed/performed Overall Cognitive Status: Difficult to assess                      General Comments      Exercises General Exercises - Lower Extremity Long Arc Quad: Strengthening;Both;10 reps;Seated   Assessment/Plan    PT Assessment Patient needs continued PT services  PT Problem List Decreased strength;Decreased activity tolerance;Decreased balance;Decreased mobility;Decreased coordination;Decreased cognition;Decreased knowledge of use of DME;Decreased safety awareness;Decreased knowledge of precautions;Decreased skin integrity;Decreased range of motion;Impaired tone;Cardiopulmonary status limiting activity;Impaired sensation          PT Treatment Interventions DME instruction;Gait training;Functional mobility training;Neuromuscular re-education;Balance training;Wheelchair mobility training;Therapeutic exercise;Therapeutic activities;Cognitive remediation;Patient/family education    PT Goals (Current goals can be found in the Care Plan section)  Acute Rehab PT Goals Patient Stated Goal: None stated  Time For Goal Achievement: 06/29/16 Potential to Achieve Goals: Fair    Frequency Min 6X/week   Barriers to discharge Decreased caregiver support Pt lives alone    Co-evaluation               End of Session Equipment Utilized During Treatment: Other (comment) (ventillator) Activity Tolerance: Patient tolerated treatment well Patient left: in bed;with call bell/phone within  reach;with nursing/sitter in room Nurse Communication: Mobility status Junie Panning, RN participated in PT evaluation, and assisted with pt's mobility.  )    Functional Assessment Tool Used: [employer] "6-clicks" Functional Limitation: Mobility: Walking and moving around Mobility: Walking and Moving Around Current Status [phone]): At least 80 percent but less than 100 percent impaired, limited or restricted Mobility: Walking and Moving Around Goal Status [phone]): At least 60 percent but less than 80 percent impaired, limited or restricted    Time: 0958-1021 PT Time Calculation (min) (ACUTE ONLY): 23 min   Charges:   PT Evaluation $PT Eval High Complexity: 1 Procedure PT Treatments $Therapeutic Activity: 8-22 mins   PT G Codes:   PT G-Codes **NOT FOR INPATIENT CLASS** Functional Assessment Tool Used: [employer] "6-clicks" Functional Limitation: Mobility: Walking and moving around Mobility: Walking and Moving Around Current Status [phone]): At least 80 percent but less than 100 percent impaired, limited or restricted Mobility: Walking and Moving Around Goal Status [phone]): At least 60 percent but less than 80 percent impaired, limited or restricted    Beth Ashleah Valtierra, PT, DPT X: [phone]

## 2016-06-15 NOTE — Progress Notes (Signed)
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 06/15/2016 07:18   Dg Chest Port 1 View  Result Date: 06/14/2016 CLINICAL DATA:  Respiratory failure EXAM: PORTABLE CHEST 1 VIEW COMPARISON:  06/13/2016 FINDINGS: Cardiomediastinal silhouette is stable. Again noted mild perihilar interstitial prominence suspicious for mild interstitial edema. Endotracheal tube is unchanged in position. Marc  segmental infiltrate. Stable right arm PICC line position. NG tube with tip in proximal stomach. IMPRESSION: Again noted mild perihilar interstitial prominence suspicious for mild interstitial edema. Endotracheal tube and NG tube is unchanged in position. Marc segmental infiltrate. Stable right arm PICC line position. NG tube with tip in proximal stomach. Electronically Signed   By: Lahoma Crocker M.D.   On: 06/14/2016 09:42   Dg Chest Port 1 View  Result Date: 06/13/2016 CLINICAL DATA:  Respiratory failure EXAM: PORTABLE CHEST 1 VIEW COMPARISON:  06/12/2016 FINDINGS: Endotracheal tube unchanged. NG tube unchanged. RIGHT PICC line unchanged. Normal cardiac silhouette. Mild interstitial edema  pattern. Marc focal infiltrate. IMPRESSION: Marc clear evidence pneumonia.  Interstitial edema suspected. Electronically Signed   By: Suzy Bouchard M.D.   On: 06/13/2016 07:45   Dg Chest Port 1 View  Result Date: 06/12/2016 CLINICAL DATA:  Intubated, follow-up.  Respiratory failure EXAM: PORTABLE CHEST 1 VIEW COMPARISON:  06/11/2016 FINDINGS: Endotracheal tube and NG tube remain in place, unchanged. Heart is normal size. Increasing bibasilar atelectasis or infiltrates. Marc visible effusions. Right PICC line is unchanged. IMPRESSION: Increasing bibasilar atelectasis or infiltrates. Electronically Signed   By: Rolm Baptise M.D.   On: 06/12/2016 07:46   Dg Chest Port 1 View  Result Date: 06/11/2016 CLINICAL DATA:  On ventilator, respiratory failure, hypertension, smoker EXAM: PORTABLE CHEST 1 VIEW COMPARISON:  Portable exam 0555 hours compared to 06/10/2016 FINDINGS: To endotracheal tube 5.3 cm above carina. Nasogastric tube extends into stomach. RIGHT arm PICC line tip projects over cavoatrial junction. Normal heart size, mediastinal contours, and pulmonary vascularity. Atherosclerotic calcification aorta. Minimal RIGHT basilar atelectasis. Lungs otherwise clear. Marc pleural effusion or pneumothorax. Prior thoracolumbar spinal fusion. Osseous demineralization. IMPRESSION: Minimal RIGHT basilar atelectasis. Aortic atherosclerosis. Electronically Signed   By: Lavonia Dana M.D.   On: 06/11/2016 08:36   Dg Chest Port 1 View  Result Date: 06/10/2016 CLINICAL DATA:  Respiratory failure EXAM: PORTABLE CHEST 1 VIEW COMPARISON:  06/09/2016 FINDINGS: Endotracheal tube and nasogastric catheter are again seen and stable. The nasogastric catheter shows the proximal side port to lie within the distal esophagus and could be advanced slightly. Right-sided PICC line has been placed in the interval in satisfactory position. The lungs are well-aerated without focal infiltrate or sizable effusion. Postsurgical changes in the  thoracolumbar spine are again seen. IMPRESSION: Interval PICC line placement in satisfactory position. Nasogastric catheter as described which could be advanced as necessary. Marc other focal abnormality is seen. Electronically Signed   By: Inez Catalina M.D.   On: 06/10/2016 09:23   Portable Chest Xray  Result Date: 06/09/2016 CLINICAL DATA:  Respiratory failure. EXAM: PORTABLE CHEST 1 VIEW COMPARISON:  Radiographs of June 04, 2016. FINDINGS: The heart size and mediastinal contours are within normal limits. Both lungs are clear. Marc pneumothorax or pleural effusion is noted. Endotracheal tube is seen projected over tracheal air shadow with distal tip 4 cm above the carina. Nasogastric tube tip is seen in proximal stomach. The visualized skeletal structures are unremarkable. IMPRESSION: Endotracheal tube in grossly good position. Nasogastric tube tip seen in proximal stomach. Marc acute cardiopulmonary abnormality seen. Electronically Signed   By: Marijo Conception, M.D.   On: 06/09/2016 18:07   Dg Foot Complete Left  Result Date: 06/03/2016 CLINICAL DATA:  Increased weakness x 2 weeks. Has hard time walking, multiple falls. Pt says that he has been crawling around the house the last few days due to inability to walk. Pain and bruising lt foot.  Pt held for imaging. Hx of surgery on the left knee and ankle. He has had problems since the surgery. EXAM: LEFT FOOT - COMPLETE 3+ VIEW COMPARISON:  None. FINDINGS: Marc fracture.  Marc bone lesion. The joints are normally aligned. Small amount of calcification at the base of the plantar fascia adjacent to the calcaneal tuberosity. Soft tissues are otherwise unremarkable. IMPRESSION: Marc fracture, bone lesion or significant joint abnormality. Marc acute finding. Electronically Signed   By: Lajean Manes M.D.   On: 06/03/2016 11:24    Time Spent in minutes  Critical care: 55mns   Elena Davia M.D on 06/15/2016 at 4:00 PM  Between 7am to 7pm - Pager - [phone] After  7pm go to [email or web address] - password [email or web address] -  Office  [phone]

## 2016-06-15 NOTE — Clinical Social Work Note (Signed)
CSW continues to follow. CSW spoke with pt's sister, [name] on phone. [name] reports that they were planning to come last week but was under impression that pt was stable so they did not come here. Discussed with MD who plans to update [name] today.   Benay Pike, [place]

## 2016-06-16 ENCOUNTER — Inpatient Hospital Stay (HOSPITAL_COMMUNITY): Payer: PPO

## 2016-06-16 DIAGNOSIS — G629 Polyneuropathy, unspecified: Secondary | ICD-10-CM | POA: Diagnosis not present

## 2016-06-16 DIAGNOSIS — G9340 Encephalopathy, unspecified: Secondary | ICD-10-CM

## 2016-06-16 DIAGNOSIS — J9600 Acute respiratory failure, unspecified whether with hypoxia or hypercapnia: Secondary | ICD-10-CM

## 2016-06-16 LAB — BASIC METABOLIC PANEL
Anion gap: 7 (ref 5–15)
BUN: 31 mg/dL — ABNORMAL HIGH (ref 6–20)
CO2: 29 mmol/L (ref 22–32)
Calcium: 8.5 mg/dL — ABNORMAL LOW (ref 8.9–10.3)
Chloride: 106 mmol/L (ref 101–111)
Creatinine, Ser: 0.66 mg/dL (ref 0.61–1.24)
GFR calc Af Amer: >60 mL/min (ref >60)
GFR calc non Af Amer: >60 mL/min (ref >60)
Glucose, Bld: 144 mg/dL — ABNORMAL HIGH (ref 65–99)
Potassium: 3.8 mmol/L (ref 3.5–5.1)
Sodium: 142 mmol/L (ref 135–145)

## 2016-06-16 LAB — CBC
HEMATOCRIT: 30.9 % — AB (ref 39.0–52.0)
Hemoglobin: 10.5 g/dL — ABNORMAL LOW (ref 13.0–17.0)
MCH: 33.2 pg (ref 26.0–34.0)
MCHC: 34 g/dL (ref 30.0–36.0)
MCV: 97.8 fL (ref 78.0–100.0)
PLATELETS: 341 10*3/uL (ref 150–400)
RBC: 3.16 MIL/uL — ABNORMAL LOW (ref 4.22–5.81)
RDW: 16.4 % — AB (ref 11.5–15.5)
WBC: 8.7 10*3/uL (ref 4.0–10.5)

## 2016-06-16 LAB — GLUCOSE, CAPILLARY
GLUCOSE-CAPILLARY: 134 mg/dL — AB (ref 65–99)
GLUCOSE-CAPILLARY: 122 mg/dL — AB (ref 65–99)
Glucose-Capillary: 127 mg/dL — ABNORMAL HIGH (ref 65–99)
Glucose-Capillary: 118 mg/dL — ABNORMAL HIGH (ref 65–99)
Glucose-Capillary: 106 mg/dL — ABNORMAL HIGH (ref 65–99)

## 2016-06-16 LAB — BLOOD GAS, ARTERIAL
Acid-Base Excess: 3.7 mmol/L — ABNORMAL HIGH (ref 0.0–2.0)
Bicarbonate: 27.8 mmol/L (ref 20.0–28.0)
Drawn by: 317771
FIO2: 0.3
MECHVT: 550 mL
O2 Content: 30 L/min
O2 Saturation: 98.1 %
PEEP: 5 cmH2O
RATE: 14 resp/min
pCO2 arterial: 38.7 mmHg (ref 32.0–48.0)
pH, Arterial: 7.463 — ABNORMAL HIGH (ref 7.350–7.450)
pO2, Arterial: 110 mmHg — ABNORMAL HIGH (ref 83.0–108.0)

## 2016-06-16 MED ORDER — QUETIAPINE FUMARATE 25 MG PO TABS
50.0000 mg | ORAL_TABLET | Freq: Every day | ORAL | Status: DC
  Administered 2016-06-16 – 2016-06-20 (×5): 50 mg via ORAL
  Filled 2016-06-16 (×3): qty 2
  Filled 2016-06-16: qty 1
  Filled 2016-06-16 (×2): qty 2
  Filled 2016-06-16: qty 1
  Filled 2016-06-16: qty 2

## 2016-06-16 MED ORDER — MIDAZOLAM HCL 50 MG/10ML IJ SOLN
INTRAMUSCULAR | Status: AC
  Filled 2016-06-16: qty 1

## 2016-06-16 MED ORDER — SODIUM CHLORIDE 0.9 % IV SOLN: INTRAVENOUS | Status: DC | PRN

## 2016-06-16 MED ORDER — FENTANYL CITRATE (PF) 100 MCG/2ML IJ SOLN
50.0000 ug | Freq: Once | INTRAMUSCULAR | Status: AC
  Administered 2016-06-16: 50 ug via INTRAVENOUS

## 2016-06-16 MED ORDER — FENTANYL BOLUS VIA INFUSION
25.0000 ug | INTRAVENOUS | Status: DC | PRN
  Filled 2016-06-16: qty 25

## 2016-06-16 MED ORDER — DOXYCYCLINE HYCLATE 100 MG IV SOLR
100.0000 mg | Freq: Two times a day (BID) | INTRAVENOUS | Status: DC
  Administered 2016-06-16 – 2016-06-20 (×8): 100 mg via INTRAVENOUS
  Filled 2016-06-16 (×10): qty 100

## 2016-06-16 MED ORDER — DOXYCYCLINE HYCLATE 100 MG IV SOLR
INTRAVENOUS | Status: AC
  Filled 2016-06-16: qty 100

## 2016-06-16 MED ORDER — SODIUM CHLORIDE 0.9 % IV SOLN
25.0000 ug/h | INTRAVENOUS | Status: DC
  Administered 2016-06-16 – 2016-06-17 (×2): 50 ug/h via INTRAVENOUS
  Filled 2016-06-16 (×2): qty 50

## 2016-06-16 NOTE — Progress Notes (Signed)
PT Cancellation Note  Patient Details Name: Marc Rose MRN: [id number] DOB: [birth year]   Cancelled Treatment:    Reason Eval/Treat Not Completed: Patient not medically ready (Pt had become agitated overnight, and had to be re-sedated.  He also had a cuff leak in ET tube earlier today, and required re-intubation.  Will hold PT today, and check back tomorrow. )   Eustaquio Maize Mackenna Kamer, PT, DPT X: [phone]

## 2016-06-16 NOTE — Progress Notes (Addendum)
[place] A. [name], MD     [email or web address]          Marc Rose is an 66 y.o. male.   ASSESSMENT/PLAN:      Severe delirium/encephalopathy of unclear etiology at this time. We are suspecting that this may be multifactorial including UTI and medication effect from Levaquin. Levaquin has been discontinued. The picture seems most consistent with toxic metabolic encephalopathy/delirium as opposed to primary and CNS problem. The patient's spinal fluid analysis shows the typical formula with elevated protein and essentially unremarkable white cell count (cytoalbuminologic dissociation) seen in demyelinating conditions such as Guillain-Barr syndrome. The CSF does not show evidence of central nervous system infection. Attempted wean off the ventilator this weekend with associated with the patient becoming quite restless. I will place the patient on Haldol to help with his behavioral issues which she has had in the past. I will go ahead and add Seroquel l for behavioral management.   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 Completed 5 day course of IV IG    Low back pain status post L1-L2 laminectomy. HX of chronic discitis      Attempted wean this weekend. He is still on sedation but less today. There are episodes of hypotension yesterday.              The nurse reports that the patient did very well yesterday. He was not having a lot of spells of agitation. The Versed was reduced to a minimal dose. He sat up on the edge of the bed with physical therapy and follow commands. Overnight however and this morning he appears to be flailing around and agitated. He has been placed back on sedation.  GENERAL: He is intubated.   HEENT: Supple. Atraumatic normocephalic. Dense cataracts both sides.  ABDOMEN: soft  EXTREMITIES: No edema. Multiple  abrasions and lacerations involving the knees and legs.   BACK: Normal.  SKIN: Normal by inspection.    MENTAL STATUS: The patient does opens his eyes to verbal commands. This is done consistently. He does follow appendicular commands although somewhat more inconsistently.  CRANIAL NERVES: Pupils are mid position and reactive. He seems to have full extraocular movements. Facial muscles are symmetric.   MOTOR: Moves both sides well.  COORDINATION: No tremor or dysmetria  REFLEXES: The patient is areflexic in the legs and upper extremities.    EEG normal  Blood pressure 107/72, pulse 96, temperature 99.2 F (37.3 C), temperature source Axillary, resp. rate 17, height '6\' 2"'  (1.88 m), weight 165 lb 2 oz (74.9 kg), SpO2 100 %.  Past Medical History:  Diagnosis Date  . Anxiety state, unspecified   . Arthritis   . 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 Benign neoplasm of colon   . Causalgia of upper limb    Left limb  . Chronic back pain   . Chronic leg pain   . Depression   . Diabetes mellitus   . Diskitis 02/05/2015  . Drug rash 11/12/2015  . Elevated blood pressure reading without diagnosis of hypertension   . Essential hypertension, benign   . Hardware complicating wound infection ([place]) 02/05/2015  . Hyperpigmentation 05/15/2015  . Hypertension   . Impaired fasting glucose   . Peripheral arterial disease ([place])   . Pure hypercholesterolemia   . Tobacco use disorder   . Unspecified retinal detachment     Past Surgical History:  Procedure Laterality Date  .  ANKLE SURGERY     left  . APPENDECTOMY    . BACK SURGERY    . COLONOSCOPY    . KNEE SURGERY     left    Family History  Problem Relation Age of Onset  . COPD Mother   . Hyperthyroidism Mother   . Hyperthyroidism Sister     Social History:  reports that he has been smoking Cigarettes.  He has a 19.50 pack-year smoking history. He has never used smokeless tobacco. He reports that he uses drugs, including Marijuana. He reports  that he does not drink alcohol.  Allergies:  Allergies  Allergen Reactions  . Codeine Anaphylaxis, Hives and Swelling  . Penicillins Anaphylaxis, Hives and Swelling  . Latex Itching  . Strawberry Extract Hives    Medications: Prior to Admission medications   Medication Sig Start Date End Date Taking? Authorizing Provider  aspirin EC 81 MG tablet Take 81 mg by mouth daily.   Yes Historical Provider, MD  benazepril (LOTENSIN) 40 MG tablet Take 40 mg by mouth daily.   Yes Historical Provider, MD  cyclobenzaprine (FLEXERIL) 10 MG tablet Take 1 tablet (10 mg total) by mouth 3 (three) times daily as needed for muscle spasms. 09/19/13  Yes Adeline Saralyn Pilar, MD  diazepam (VALIUM) 10 MG tablet Take 0.5 tablets (5 mg total) by mouth every 6 (six) hours as needed for anxiety (back spasms). Patient taking differently: Take 10 mg by mouth 4 (four) times daily as needed for anxiety (back spasms).  09/19/13  Yes Adeline Saralyn Pilar, MD  doxycycline (VIBRA-TABS) 100 MG tablet Take 1 tablet (100 mg total) by mouth 2 (two) times daily. 11/27/15  Yes Truman Hayward, MD  fluticasone [facility]) 50 MCG/ACT nasal spray Place 1 spray into the nose daily as needed for allergies.  07/04/14  Yes Historical Provider, MD  NEOMYCIN-POLYMYXIN-HYDROCORTISONE (CORTISPORIN) 1 % SOLN otic solution Place 2 drops in ear(s) daily. Reported on 11/12/2015 07/04/14  Yes Historical Provider, MD  pregabalin (LYRICA) 75 MG capsule Take 75 mg by mouth 2 (two) times daily.  05/07/13  Yes Historical Provider, MD  tamsulosin (FLOMAX) 0.4 MG CAPS capsule Take 0.4 mg by mouth.   Yes Historical Provider, MD    Scheduled Meds: . chlorhexidine gluconate (MEDLINE KIT)  15 mL Mouth Rinse BID  . Chlorhexidine Gluconate Cloth  6 each Topical Daily  . famotidine  20 mg Oral Daily  . haloperidol  5 mg Oral BID   Or  . haloperidol lactate  5 mg Intramuscular BID  . mouth rinse  15 mL Mouth Rinse QID  . pantoprazole (PROTONIX) IV  40 mg Intravenous  Daily  . polyvinyl alcohol  1 drop Both Eyes TID  . senna-docusate  1 tablet Oral QHS  . sodium chloride flush  10-40 mL Intracatheter Q12H  . thiamine injection  100 mg Intravenous Daily   Continuous Infusions: . feeding supplement (VITAL AF 1.2 CAL) 1,000 mL (06/15/16 1609)  . midazolam (VERSED) infusion 2 mg/hr (06/16/16 0748)  . phenylephrine (NEO-SYNEPHRINE) Adult infusion 50 mcg/min (06/16/16 0600)   PRN Meds:.Place/Maintain arterial line **AND** sodium chloride, acetaminophen **OR** acetaminophen, bisacodyl, fentaNYL (SUBLIMAZE) injection, haloperidol lactate, magnesium hydroxide, midazolam, midazolam, ondansetron **OR** ondansetron (ZOFRAN) IV, sodium chloride flush     Results for orders placed or performed during the hospital encounter of 06/03/16 (from the past 48 hour(s))  Glucose, capillary     Status: Abnormal   Collection Time: 06/14/16 11:06 AM  Result Value Ref Range  Glucose-Capillary 143 (H) 65 - 99 mg/dL  Glucose, capillary     Status: Abnormal   Collection Time: 06/14/16  4:27 PM  Result Value Ref Range   Glucose-Capillary 101 (H) 65 - 99 mg/dL  Glucose, capillary     Status: Abnormal   Collection Time: 06/14/16  8:08 PM  Result Value Ref Range   Glucose-Capillary 126 (H) 65 - 99 mg/dL   Comment 1 Notify RN   Glucose, capillary     Status: Abnormal   Collection Time: 06/15/16 12:09 AM  Result Value Ref Range   Glucose-Capillary 127 (H) 65 - 99 mg/dL   Comment 1 Notify RN   Glucose, capillary     Status: Abnormal   Collection Time: 06/15/16  4:09 AM  Result Value Ref Range   Glucose-Capillary 104 (H) 65 - 99 mg/dL   Comment 1 Notify RN   Comprehensive metabolic panel     Status: Abnormal   Collection Time: 06/15/16  4:23 AM  Result Value Ref Range   Sodium 140 135 - 145 mmol/L   Potassium 3.9 3.5 - 5.1 mmol/L   Chloride 107 101 - 111 mmol/L   CO2 30 22 - 32 mmol/L   Glucose, Bld 120 (H) 65 - 99 mg/dL   BUN 34 (H) 6 - 20 mg/dL   Creatinine, Ser  0.72 0.61 - 1.24 mg/dL   Calcium 8.3 (L) 8.9 - 10.3 mg/dL   Total Protein 6.7 6.5 - 8.1 g/dL   Albumin 2.3 (L) 3.5 - 5.0 g/dL   AST 56 (H) 15 - 41 U/L   ALT 53 17 - 63 U/L   Alkaline Phosphatase 101 38 - 126 U/L   Total Bilirubin 0.6 0.3 - 1.2 mg/dL   GFR calc non Af Amer >60 >60 mL/min   GFR calc Af Amer >60 >60 mL/min    Comment: (NOTE) The eGFR has been calculated using the CKD EPI equation. This calculation has not been validated in all clinical situations. eGFR's persistently <60 mL/min signify possible Chronic Kidney Disease.    Anion gap 3 (L) 5 - 15  CBC     Status: Abnormal   Collection Time: 06/15/16  4:23 AM  Result Value Ref Range   WBC 9.7 4.0 - 10.5 K/uL   RBC 3.22 (L) 4.22 - 5.81 MIL/uL   Hemoglobin 10.5 (L) 13.0 - 17.0 g/dL   HCT 31.1 (L) 39.0 - 52.0 %   MCV 96.6 78.0 - 100.0 fL   MCH 32.6 26.0 - 34.0 pg   MCHC 33.8 30.0 - 36.0 g/dL   RDW 16.5 (H) 11.5 - 15.5 %   Platelets 329 150 - 400 K/uL  Blood gas, arterial     Status: Abnormal   Collection Time: 06/15/16  5:40 AM  Result Value Ref Range   FIO2 30.00    Delivery systems VENTILATOR    Mode PRESSURE REGULATED VOLUME CONTROL    VT 550 mL   LHR 14.0 resp/min   Peep/cpap 5.0 cm H20   pH, Arterial 7.462 (H) 7.350 - 7.450   pCO2 arterial 41.5 32.0 - 48.0 mmHg   pO2, Arterial 94.6 83.0 - 108.0 mmHg   Bicarbonate 29.2 (H) 20.0 - 28.0 mmol/L   Acid-Base Excess 5.4 (H) 0.0 - 2.0 mmol/L   O2 Saturation 97.2 %   Patient temperature 37.0    Collection site RIGHT RADIAL    Drawn by [id number]    Sample type ARTERIAL    Allens test (pass/fail) PASS PASS  Glucose, capillary     Status: Abnormal   Collection Time: 06/15/16  7:42 AM  Result Value Ref Range   Glucose-Capillary 132 (H) 65 - 99 mg/dL  Glucose, capillary     Status: Abnormal   Collection Time: 06/15/16 11:19 AM  Result Value Ref Range   Glucose-Capillary 121 (H) 65 - 99 mg/dL  Glucose, capillary     Status: Abnormal   Collection Time: 06/15/16   4:07 PM  Result Value Ref Range   Glucose-Capillary 129 (H) 65 - 99 mg/dL  Glucose, capillary     Status: Abnormal   Collection Time: 06/15/16  7:33 PM  Result Value Ref Range   Glucose-Capillary 128 (H) 65 - 99 mg/dL   Comment 1 Notify RN   Glucose, capillary     Status: Abnormal   Collection Time: 06/15/16 11:56 PM  Result Value Ref Range   Glucose-Capillary 116 (H) 65 - 99 mg/dL   Comment 1 Notify RN   Basic metabolic panel     Status: Abnormal   Collection Time: 06/16/16  4:24 AM  Result Value Ref Range   Sodium 142 135 - 145 mmol/L   Potassium 3.8 3.5 - 5.1 mmol/L   Chloride 106 101 - 111 mmol/L   CO2 29 22 - 32 mmol/L   Glucose, Bld 144 (H) 65 - 99 mg/dL   BUN 31 (H) 6 - 20 mg/dL   Creatinine, Ser 0.66 0.61 - 1.24 mg/dL   Calcium 8.5 (L) 8.9 - 10.3 mg/dL   GFR calc non Af Amer >60 >60 mL/min   GFR calc Af Amer >60 >60 mL/min    Comment: (NOTE) The eGFR has been calculated using the CKD EPI equation. This calculation has not been validated in all clinical situations. eGFR's persistently <60 mL/min signify possible Chronic Kidney Disease.    Anion gap 7 5 - 15  CBC     Status: Abnormal   Collection Time: 06/16/16  4:24 AM  Result Value Ref Range   WBC 8.7 4.0 - 10.5 K/uL   RBC 3.16 (L) 4.22 - 5.81 MIL/uL   Hemoglobin 10.5 (L) 13.0 - 17.0 g/dL   HCT 30.9 (L) 39.0 - 52.0 %   MCV 97.8 78.0 - 100.0 fL   MCH 33.2 26.0 - 34.0 pg   MCHC 34.0 30.0 - 36.0 g/dL   RDW 16.4 (H) 11.5 - 15.5 %   Platelets 341 150 - 400 K/uL  Glucose, capillary     Status: Abnormal   Collection Time: 06/16/16  4:24 AM  Result Value Ref Range   Glucose-Capillary 134 (H) 65 - 99 mg/dL  Blood gas, arterial     Status: Abnormal   Collection Time: 06/16/16  4:54 AM  Result Value Ref Range   FIO2 0.30    O2 Content 30.0 L/min   Delivery systems VENTILATOR    Mode PRESSURE REGULATED VOLUME CONTROL    VT 550 mL   LHR 14.0 resp/min   Peep/cpap 5.0 cm H20   pH, Arterial 7.463 (H) 7.350 - 7.450     pCO2 arterial 38.7 32.0 - 48.0 mmHg   pO2, Arterial 110 (H) 83.0 - 108.0 mmHg   Bicarbonate 27.8 20.0 - 28.0 mmol/L   Acid-Base Excess 3.7 (H) 0.0 - 2.0 mmol/L   O2 Saturation 98.1 %   Collection site LEFT RADIAL    Drawn by [id number]    Sample type ARTERIAL DRAW    Allens test (pass/fail) PASS PASS  Glucose, capillary  Status: Abnormal   Collection Time: 06/16/16  7:54 AM  Result Value Ref Range   Glucose-Capillary 122 (H) 65 - 99 mg/dL   Comment 1 Notify RN     Studies/Results:  CTA LUNG IMPRESSION: Manubrial fracture  No evidence of pulmonary emboli.  Left lower lobe infiltrate and effusion.     BRAIN MRI FINDINGS: Brain: Cerebral volume is within normal limits for age. No restricted diffusion to suggest acute infarction. No midline shift, mass effect, evidence of mass lesion, ventriculomegaly, extra-axial collection or acute intracranial hemorrhage. Cervicomedullary junction and pituitary are within normal limits.  Pearline Cables and white matter signal is within normal limits for age throughout the brain. No cortical encephalomalacia or chronic cerebral blood products.  Vascular: Major intracranial vascular flow voids are preserved.  Skull and upper cervical spine: Negative. Normal bone marrow signal.  Sinuses/Orbits: Normal orbits soft tissues. Trace paranasal sinus mucosal thickening.  Other: Visible internal auditory structures appear normal. Mastoids are clear. Negative scalp soft tissues.  IMPRESSION: No acute intracranial abnormality. Normal for age noncontrast MRI appearance of the brain.            L SPINE MRI 2015 FINDINGS: Trace anterolisthesis of L3 on L4 and L4 on L5 is unchanged. There is grade 1 retrolisthesis of L1 on L2, stable to minimally increased from prior MRI. New from the prior MRI is a mild compression deformity involving the L2 superior endplate with approximately 10% vertebral body height loss. There is abnormal  fluid signal within the L1-2 disc space, and there is also new edema throughout the L1 and L2 vertebral bodies. There is paraspinal inflammatory change/ phlegmon at this level, and there is a 1.9 x 1.3 cm fluid collection in the left psoas muscle at the L2 level (series 5, image 16).  Advanced disc space height loss at L5-S1 is unchanged, however there is increased fluid signal within the disc space compared to the prior MRI with mildly increased marrow edema within the adjacent L5 and S1 vertebral bodies. The conus medullaris is normal in signal and terminates at the superior aspect of L2.  T11-12: Only imaged sagittally. Facet hypertrophy results in mild bilateral neural foraminal narrowing, right greater than left and unchanged.  T12-L1:  Negative.  L1-2: Sequelae of prior laminectomy and posterior fusion are again identified. Posterior retropulsion of the superior L2 endplate, greater on the right, with possibly small adjacent epidural fluid collection, results in new narrowing of the right lateral recess and increased right neural foraminal narrowing. No spinal canal stenosis.  L2-3: Mild disc bulge and facet hypertrophy result in mild right lateral recess and neural foraminal narrowing, unchanged.  L3-4: Mild disc bulge and moderate facet hypertrophy result in mild right neural foraminal narrowing, unchanged. No spinal canal stenosis.  L4-5: Left foraminal disc protrusion and mild-to-moderate facet hypertrophy result in mild to moderate left neural foraminal stenosis, unchanged. No spinal canal stenosis.  L5-S1: Disc bulge and mild facet hypertrophy result in mild to moderate bilateral neural foraminal stenosis, unchanged. No spinal canal stenosis.  IMPRESSION: 1. Changes at L1-2 as above concerning for discitis/osteomyelitis with small left psoas abscess. New, mild compression deformity of the L2 superior endplate and mild retropulsion results in new  right lateral recess narrowing and increased right neural foraminal narrowing at L1-2. Small ventral epidural fluid collection is not excluded on the right. 2. Advanced degenerative disc disease at L5-S1 with a small amount of new fluid signal in the disc space and mildly increased adjacent marrow changes. Early  discitis/osteomyelitis is not excluded.       Tsuruko Murtha A. [name], M.D.  Diplomate, Tax adviser of Psychiatry and Neurology ( Neurology). 06/16/2016, 8:29 AM

## 2016-06-16 NOTE — Progress Notes (Signed)
Patient with cuff leak, inadequate volumes on the ventilator and although cuff pressures was increased was still unable to seal cuff leak. MD notified, RT was able to use a boogie to do a tube exchange. No complications noted. RT and RN was at bedside and assisted with tube exchange. Patient remained stable throughout, SATs 98%, HR 95 and RR 24. Xray performed and positive color change on ETCO2, BBS ausculated.Patient was placed back on ventilator, RT will continue to monitor.

## 2016-06-16 NOTE — Progress Notes (Signed)
PROGRESS NOTE  Marc Rose GNF:[id number] DOB: 04-10-1951 DOA: 06/03/2016 PCP: [name] Patient  Brief Narrative: 66 year old man multiple medical problems presented with worsening bilateral lower extremity weakness resulting in multiple falls and traumatic skin tears on the feet and knees, also reported severe abdominal pain with fullness sensation in lower abdomen. Admitted for UTI, acute kidney injury, generalized weakness. He was evaluated by therapy who recommended skilled nursing facility. 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 He was treated with IVIG. Approximately 1 week into the hospitalization he developed acute encephalopathy with decreased mentation, was transferred to the stepdown unit and started on Precedex. He quickly decompensated and was intubated 2/7. He subsequently developed hypotension requiring vasopressor support.  Assessment/Plan 1. Acute hypoxic respiratory failure requiring ventilator support. Etiology unclear, chest x-ray clear, no evidence of infection. No gross CT findings on previous chest CT. Agitation has limited attempts at weaning. May be related to GBS. 2. 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 3. Hypotension requiring vasopressor support. Etiology and significance unclear. May be artifactual in nature. Attempts were made to place an arterial line which was unsuccessful. Patient's awake and responsive, suspect hypotension is artifactual. Now off vasopressors this afternoon with acceptable blood pressure. 4. Acute encephalopathy thought to be toxic metabolic. No evidence of infection. CSF studies were unremarkable. Regard to infection. MRI brain unremarkable. Etiology unclear. 5. Acute kidney injury secondary to prerenal azotemia. Resolved with IV fluids. 6. Hyponatremia, thought secondary to hypovolemic hyponatremia. Resolved with  IV fluids. 7. Diabetes mellitus stable. 8. PMH multiple lumbar/spinal surgeries, chronic lumbar discitis and osteomyelitis on suppressive therapy with doxycycline, urinary retention requiring self bladder catheterization since 2014  9. Chronic lower abdominal pain. CT abdomen and pelvis no acute finding. Protonix for esophagitis. 10. 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 Reported progressive spasticity arms and legs. 11. Peripheral arterial disease 12. Status post enterococcus UTI associated with outpatient in/out self bladder catheterizations 13. Moderate malnutrition 14. Manubrial fracture seen on chest CT. Follow-up as an outpatient. 15. Right upper lobe nodule. Consider chest CT in 12 months. 16. Aortic atherosclerosis   Patient remains critically ill and ventilator dependent. Hopefully can be weaned. Defer management to pulmonology. He's had significant pain today requiring initiation of fentanyl infusion. Fortunately his hypotension seems to have resolved and he is now off vasopressors. Appreciate neurology involvement as well. Laboratory studies are acceptable.  Prognosis is guarded, he remains critically ill and it's not clear whether significant improvement can be expected. He may be a candidate for LTAC.  Code Status: full code Family Communication:  Disposition Plan: pending   [name], MD  [facility] Direct contact: [phone] --Via amion app OR  --[email or web address]; password [email or web address]  7PM-7AM contact night coverage as above 06/16/2016, 5:24 PM  LOS: 13 days   Consultants:  Neurology  Pulmonology  Procedures:  Intubation 2/7>>   Lumbar puncture 2/8  EEG IMPRESSION: This is a normal recording of the awake and drowsy states.  Echo Study Conclusions  - Left ventricle: The cavity size was normal. Wall thickness was   increased in a pattern of mild LVH. Systolic  function was normal.   The estimated ejection fraction was in the range of 60% to 65%.   Doppler parameters are consistent with abnormal left ventricular   relaxation (grade 1 diastolic dysfunction). - Aortic valve: Calcified non coronary  cusp. Valve area (VTI): 1.83   cm^2. Valve area (Vmax): 1.83 cm^2. Valve area (Vmean): 1.84   cm^2. - Atrial septum: No defect or patent foramen ovale was identified. - Pulmonary arteries: PA peak pressure: 39 mm Hg (S). Antimicrobials:  None today  Interval history/Subjective: Interval event noted. Cuff leak, inadequate ventilator volumes, tube exchanged. EEG reported as normal.  Currently on phenylephrine and IV midazolam  Objective: Vitals:   06/16/16 1615 06/16/16 1630 06/16/16 1645 06/16/16 1700  BP: 125/75 (!) 148/97 132/89 (!) 146/94  Pulse: (!) 105 (!) 101    Resp: (!) _0 Temp: 98.3 F (36.8 C)     TempSrc:      SpO2: 100%     Weight:      Height:        Intake/Output Summary (Last 24 hours) at 06/16/16 1724 Last data filed at 06/16/16 1659  Gross per 24 hour  Intake          1810.71 ml  Output             1600 ml  Net           210.71 ml     Filed Weights   06/14/16 0500 06/15/16 0450 06/16/16 0500  Weight: 73.7 kg (162 lb 7.7 oz) 75.2 kg (165 lb 12.6 oz) 74.9 kg (165 lb 2 oz)    Exam:    Constitutional:   Appears chronically ill, thin, currently sedated with IV Versed, opens eyes to command, otherwise does not participate Eyes:   Pupils, irises, lips appear unremarkable ENMT:   Lips appear grossly unremarkable. Intubated. Neck:   Grossly unremarkable Respiratory:   Clear to auscultation bilaterally. No wheezes, rales or rhonchi. Normal respiratory effort. Cardiovascular:   Regular rate and rhythm. No murmur, rub or gallops. No lower extremity edema.  Telemetry sinus rhythm Abdomen:   Soft, nontender, nondistended Musculoskeletal:   Does not move arms or legs to command Skin:   Multiple  abrasions over bilateral lower extremities and toes Neurologic:   Unable to assess secondary to above Psychiatric:   Unable to assess   I have personally reviewed following labs and imaging studies:  ABG 7.4/38/110  BMP unremarkable  Normal WBC, hemoglobin stable 10.5  Chest x-ray no acute disease  Blood sugars stable  EKG sinus tachycardia, no acute changes  Scheduled Meds: . chlorhexidine gluconate (MEDLINE KIT)  15 mL Mouth Rinse BID  . Chlorhexidine Gluconate Cloth  6 each Topical Daily  . famotidine  20 mg Oral Daily  . haloperidol  5 mg Oral BID   Or  . haloperidol lactate  5 mg Intramuscular BID  . mouth rinse  15 mL Mouth Rinse QID  . pantoprazole (PROTONIX) IV  40 mg Intravenous Daily  . polyvinyl alcohol  1 drop Both Eyes TID  . QUEtiapine  50 mg Oral QHS  . senna-docusate  1 tablet Oral QHS  . sodium chloride flush  10-40 mL Intracatheter Q12H  . thiamine injection  100 mg Intravenous Daily   Continuous Infusions: . feeding supplement (VITAL AF 1.2 CAL) 1,000 mL (06/15/16 1609)  . fentaNYL infusion INTRAVENOUS 50 mcg/hr (06/16/16 1542)  . midazolam (VERSED) infusion 7 mg/hr (06/16/16 1446)  . phenylephrine (NEO-SYNEPHRINE) Adult infusion Stopped (06/16/16 1230)    Principal Problem:   Acute respiratory failure (HCC) Active Problems:   HTN (hypertension)   Long term current use of antibiotics   Back pain, chronic   Discitis of lumbar region  Nerve disease, peripheral (HCC)   Neurogenic urinary bladder disorder   Ataxia   Muscle weakness (generalized)   Ataxic gait   Miller-Fisher variant Guillain-Barre syndrome (HCC)   Malnutrition of moderate degree   Acute encephalopathy   LOS: 13 days

## 2016-06-16 NOTE — Procedures (Signed)
  Marc A. [name], MD     [email or web address]           HISTORY: The patient is a 66 year old presents with marked confusion and altered mental status. The studies being done to evaluate for seizures.  MEDICATIONS: Scheduled Meds: . chlorhexidine gluconate (MEDLINE KIT)  15 mL Mouth Rinse BID  . Chlorhexidine Gluconate Cloth  6 each Topical Daily  . famotidine  20 mg Oral Daily  . haloperidol  5 mg Oral BID   Or  . haloperidol lactate  5 mg Intramuscular BID  . mouth rinse  15 mL Mouth Rinse QID  . pantoprazole (PROTONIX) IV  40 mg Intravenous Daily  . polyvinyl alcohol  1 drop Both Eyes TID  . senna-docusate  1 tablet Oral QHS  . sodium chloride flush  10-40 mL Intracatheter Q12H  . thiamine injection  100 mg Intravenous Daily   Continuous Infusions: . feeding supplement (VITAL AF 1.2 CAL) 1,000 mL (06/15/16 1609)  . midazolam (VERSED) infusion 2 mg/hr (06/16/16 0748)  . phenylephrine (NEO-SYNEPHRINE) Adult infusion 50 mcg/min (06/16/16 0600)   PRN Meds:.Place/Maintain arterial line **AND** sodium chloride, acetaminophen **OR** acetaminophen, bisacodyl, fentaNYL (SUBLIMAZE) injection, haloperidol lactate, magnesium hydroxide, midazolam, midazolam, ondansetron **OR** ondansetron (ZOFRAN) IV, sodium chloride flush  Prior to Admission medications   Medication Sig Start Date End Date Taking? Authorizing Provider  aspirin EC 81 MG tablet Take 81 mg by mouth daily.   Yes Historical Provider, MD  benazepril (LOTENSIN) 40 MG tablet Take 40 mg by mouth daily.   Yes Historical Provider, MD  cyclobenzaprine (FLEXERIL) 10 MG tablet Take 1 tablet (10 mg total) by mouth 3 (three) times daily as needed for muscle spasms. 09/19/13  Yes Adeline Saralyn Pilar, MD  diazepam (VALIUM) 10 MG tablet Take 0.5 tablets (5 mg total) by mouth every 6 (six) hours as needed for anxiety (back spasms). Patient taking differently: Take 10 mg by mouth 4 (four) times daily as needed for  anxiety (back spasms).  09/19/13  Yes Adeline Saralyn Pilar, MD  doxycycline (VIBRA-TABS) 100 MG tablet Take 1 tablet (100 mg total) by mouth 2 (two) times daily. 11/27/15  Yes Truman Hayward, MD  fluticasone [facility]) 50 MCG/ACT nasal spray Place 1 spray into the nose daily as needed for allergies.  07/04/14  Yes Historical Provider, MD  NEOMYCIN-POLYMYXIN-HYDROCORTISONE (CORTISPORIN) 1 % SOLN otic solution Place 2 drops in ear(s) daily. Reported on 11/12/2015 07/04/14  Yes Historical Provider, MD  pregabalin (LYRICA) 75 MG capsule Take 75 mg by mouth 2 (two) times daily.  05/07/13  Yes Historical Provider, MD  tamsulosin (FLOMAX) 0.4 MG CAPS capsule Take 0.4 mg by mouth.   Yes Historical Provider, MD      ANALYSIS: A 16 channel recording using standard 10 20 measurements is conducted for 22 minutes. The background activity gets as high as 8 1/2 hertz bilaterally. There is beta activity observing the frontal areas. Awake and drowsy activities are observed. There is significant chewing on the endotracheal tube results in insignificant myogenic artifact at times throughout the recording. Photic stimulation and hypoventilation are not conducted. There is no focal or lateral slowing. There is no epileptiform activities observed.   IMPRESSION: This is a normal recording of the awake and drowsy states.      Merica Prell A. Merlene Rose, M.D.  Diplomate, Tax adviser of Psychiatry and Neurology ( Neurology).

## 2016-06-16 NOTE — Progress Notes (Signed)
Subjective: He remains intubated and on the ventilator. He is more responsive this morning. He has been off and on pressors. No other new issues noted  Objective: Vital signs in last 24 hours: Temp:  [97.9 F (36.6 C)-99.2 F (37.3 C)] 99.2 F (37.3 C) (02/14 0405) Pulse Rate:  [87-116] 96 (02/14 0700) Resp:  [13-24] 17 (02/14 0700) BP: (62-160)/(44-102) 107/72 (02/14 0700) SpO2:  [96 %-100 %] 100 % (02/14 0700) FiO2 (%):  [30 %] 30 % (02/14 0424) Weight:  [74.9 kg (165 lb 2 oz)] 74.9 kg (165 lb 2 oz) (02/14 0500) Weight change: -0.3 kg (-10.6 oz) Last BM Date: 06/08/16  Intake/Output from previous day: 02/13 0701 - 02/14 0700 In: 2651.9 [I.V.:413.6; NG/GT:2208.3] Out: 1325 [Urine:1325]  PHYSICAL EXAM General appearance: He is intubated and sedated but he does open his eyes and he nods at me. Resp: rhonchi bilaterally Cardio: regular rate and rhythm, S1, S2 normal, no murmur, click, rub or gallop GI: soft, non-tender; bowel sounds normal; no masses,  no organomegaly Extremities: Multiple scrapes and bruises on his legs Skin warm and dry. Pupils react. Mucous membranes are moist  Lab Results:  Results for orders placed or performed during the hospital encounter of 06/03/16 (from the past 48 hour(s))  Glucose, capillary     Status: Abnormal   Collection Time: 06/14/16  7:57 AM  Result Value Ref Range   Glucose-Capillary 115 (H) 65 - 99 mg/dL  Glucose, capillary     Status: Abnormal   Collection Time: 06/14/16 11:06 AM  Result Value Ref Range   Glucose-Capillary 143 (H) 65 - 99 mg/dL  Glucose, capillary     Status: Abnormal   Collection Time: 06/14/16  4:27 PM  Result Value Ref Range   Glucose-Capillary 101 (H) 65 - 99 mg/dL  Glucose, capillary     Status: Abnormal   Collection Time: 06/14/16  8:08 PM  Result Value Ref Range   Glucose-Capillary 126 (H) 65 - 99 mg/dL   Comment 1 Notify RN   Glucose, capillary     Status: Abnormal   Collection Time: 06/15/16 12:09 AM   Result Value Ref Range   Glucose-Capillary 127 (H) 65 - 99 mg/dL   Comment 1 Notify RN   Glucose, capillary     Status: Abnormal   Collection Time: 06/15/16  4:09 AM  Result Value Ref Range   Glucose-Capillary 104 (H) 65 - 99 mg/dL   Comment 1 Notify RN   Comprehensive metabolic panel     Status: Abnormal   Collection Time: 06/15/16  4:23 AM  Result Value Ref Range   Sodium 140 135 - 145 mmol/Rose   Potassium 3.9 3.5 - 5.1 mmol/Rose   Chloride 107 101 - 111 mmol/Rose   CO2 30 22 - 32 mmol/Rose   Glucose, Bld 120 (H) 65 - 99 mg/dL   BUN 34 (H) 6 - 20 mg/dL   Creatinine, Ser 0.72 0.61 - 1.24 mg/dL   Calcium 8.3 (Rose) 8.9 - 10.3 mg/dL   Total Protein 6.7 6.5 - 8.1 g/dL   Albumin 2.3 (Rose) 3.5 - 5.0 g/dL   AST 56 (H) 15 - 41 U/Rose   ALT 53 17 - 63 U/Rose   Alkaline Phosphatase 101 38 - 126 U/Rose   Total Bilirubin 0.6 0.3 - 1.2 mg/dL   GFR calc non Af Amer >60 >60 mL/min   GFR calc Af Amer >60 >60 mL/min    Comment: (NOTE) The eGFR has been calculated using the CKD EPI  equation. This calculation has not been validated in all clinical situations. eGFR's persistently <60 mL/min signify possible Chronic Kidney Disease.    Anion gap 3 (Rose) 5 - 15  CBC     Status: Abnormal   Collection Time: 06/15/16  4:23 AM  Result Value Ref Range   WBC 9.7 4.0 - 10.5 K/uL   RBC 3.22 (Rose) 4.22 - 5.81 MIL/uL   Hemoglobin 10.5 (Rose) 13.0 - 17.0 g/dL   HCT 31.1 (Rose) 39.0 - 52.0 %   MCV 96.6 78.0 - 100.0 fL   MCH 32.6 26.0 - 34.0 pg   MCHC 33.8 30.0 - 36.0 g/dL   RDW 16.5 (H) 11.5 - 15.5 %   Platelets 329 150 - 400 K/uL  Blood gas, arterial     Status: Abnormal   Collection Time: 06/15/16  5:40 AM  Result Value Ref Range   FIO2 30.00    Delivery systems VENTILATOR    Mode PRESSURE REGULATED VOLUME CONTROL    VT 550 mL   LHR 14.0 resp/min   Peep/cpap 5.0 cm H20   pH, Arterial 7.462 (H) 7.350 - 7.450   pCO2 arterial 41.5 32.0 - 48.0 mmHg   pO2, Arterial 94.6 83.0 - 108.0 mmHg   Bicarbonate 29.2 (H) 20.0 - 28.0  mmol/Rose   Acid-Base Excess 5.4 (H) 0.0 - 2.0 mmol/Rose   O2 Saturation 97.2 %   Patient temperature 37.0    Collection site RIGHT RADIAL    Drawn by [phone]    Sample type ARTERIAL    Allens test (pass/fail) PASS PASS  Glucose, capillary     Status: Abnormal   Collection Time: 06/15/16  7:42 AM  Result Value Ref Range   Glucose-Capillary 132 (H) 65 - 99 mg/dL  Glucose, capillary     Status: Abnormal   Collection Time: 06/15/16 11:19 AM  Result Value Ref Range   Glucose-Capillary 121 (H) 65 - 99 mg/dL  Glucose, capillary     Status: Abnormal   Collection Time: 06/15/16  4:07 PM  Result Value Ref Range   Glucose-Capillary 129 (H) 65 - 99 mg/dL  Glucose, capillary     Status: Abnormal   Collection Time: 06/15/16  7:33 PM  Result Value Ref Range   Glucose-Capillary 128 (H) 65 - 99 mg/dL   Comment 1 Notify RN   Glucose, capillary     Status: Abnormal   Collection Time: 06/15/16 11:56 PM  Result Value Ref Range   Glucose-Capillary 116 (H) 65 - 99 mg/dL   Comment 1 Notify RN   Basic metabolic panel     Status: Abnormal   Collection Time: 06/16/16  4:24 AM  Result Value Ref Range   Sodium 142 135 - 145 mmol/Rose   Potassium 3.8 3.5 - 5.1 mmol/Rose   Chloride 106 101 - 111 mmol/Rose   CO2 29 22 - 32 mmol/Rose   Glucose, Bld 144 (H) 65 - 99 mg/dL   BUN 31 (H) 6 - 20 mg/dL   Creatinine, Ser 0.66 0.61 - 1.24 mg/dL   Calcium 8.5 (Rose) 8.9 - 10.3 mg/dL   GFR calc non Af Amer >60 >60 mL/min   GFR calc Af Amer >60 >60 mL/min    Comment: (NOTE) The eGFR has been calculated using the CKD EPI equation. This calculation has not been validated in all clinical situations. eGFR's persistently <60 mL/min signify possible Chronic Kidney Disease.    Anion gap 7 5 - 15  CBC     Status: Abnormal  Collection Time: 06/16/16  4:24 AM  Result Value Ref Range   WBC 8.7 4.0 - 10.5 K/uL   RBC 3.16 (Rose) 4.22 - 5.81 MIL/uL   Hemoglobin 10.5 (Rose) 13.0 - 17.0 g/dL   HCT 30.9 (Rose) 39.0 - 52.0 %   MCV 97.8 78.0 - 100.0  fL   MCH 33.2 26.0 - 34.0 pg   MCHC 34.0 30.0 - 36.0 g/dL   RDW 16.4 (H) 11.5 - 15.5 %   Platelets 341 150 - 400 K/uL  Glucose, capillary     Status: Abnormal   Collection Time: 06/16/16  4:24 AM  Result Value Ref Range   Glucose-Capillary 134 (H) 65 - 99 mg/dL  Blood gas, arterial     Status: Abnormal   Collection Time: 06/16/16  4:54 AM  Result Value Ref Range   FIO2 0.30    O2 Content 30.0 Rose/min   Delivery systems VENTILATOR    Mode PRESSURE REGULATED VOLUME CONTROL    VT 550 mL   LHR 14.0 resp/min   Peep/cpap 5.0 cm H20   pH, Arterial 7.463 (H) 7.350 - 7.450   pCO2 arterial 38.7 32.0 - 48.0 mmHg   pO2, Arterial 110 (H) 83.0 - 108.0 mmHg   Bicarbonate 27.8 20.0 - 28.0 mmol/Rose   Acid-Base Excess 3.7 (H) 0.0 - 2.0 mmol/Rose   O2 Saturation 98.1 %   Collection site LEFT RADIAL    Drawn by [phone]    Sample type ARTERIAL DRAW    Allens test (pass/fail) PASS PASS    ABGS  Recent Labs  06/16/16 0454  PHART 7.463*  PO2ART 110*  HCO3 27.8   CULTURES Recent Results (from the past 240 hour(s))  MRSA PCR Screening     Status: None   Collection Time: 06/09/16  5:00 PM  Result Value Ref Range Status   MRSA by PCR NEGATIVE NEGATIVE Final    Comment:        The GeneXpert MRSA Assay (FDA approved for NASAL specimens only), is one component of a comprehensive MRSA colonization surveillance program. It is not intended to diagnose MRSA infection nor to guide or monitor treatment for MRSA infections.   Culture, blood (routine x 2)     Status: None   Collection Time: 06/09/16  7:15 PM  Result Value Ref Range Status   Specimen Description BLOOD RIGHT HAND  Final   Special Requests BOTTLES DRAWN AEROBIC AND ANAEROBIC 6CC  Final   Culture NO GROWTH 6 DAYS  Final   Report Status 06/15/2016 FINAL  Final  Culture, blood (routine x 2)     Status: None   Collection Time: 06/09/16  7:20 PM  Result Value Ref Range Status   Specimen Description BLOOD LEFT HAND  Final   Special  Requests BOTTLES DRAWN AEROBIC AND ANAEROBIC 6CC  Final   Culture NO GROWTH 6 DAYS  Final   Report Status 06/15/2016 FINAL  Final  CSF culture     Status: None   Collection Time: 06/10/16  8:00 AM  Result Value Ref Range Status   Specimen Description CSF  Final   Special Requests Normal  Final   Gram Stain   Final    NO ORGANISMS SEEN NO WBC SEEN CYTOSPIN SMEAR Gram Stain Report Called to,Read Back By and Verified With: WILLIE E. AT [id number] ON [id number] BY THOMPSON S.    Culture   Final    NO GROWTH 3 DAYS Performed at [facility], [place] [place],  [place] [address]    Report Status 06/13/2016 FINAL  Final   Studies/Results: Dg Chest Port 1 View  Result Date: 06/15/2016 CLINICAL DATA:  Ventilation. EXAM: PORTABLE CHEST 1 VIEW COMPARISON:  06/14/2016. FINDINGS: Endotracheal tube, NG tube, and right PICC line in stable position. Heart size normal. Lungs are clear. No pleural effusion or pneumothorax. Prior thoracolumbar spine fusion. IMPRESSION: 1. Lines and tubes in stable position. 2. No acute cardiopulmonary disease. Electronically Signed   By: Marcello Moores  Register   On: 06/15/2016 07:18   Dg Chest Port 1 View  Result Date: 06/14/2016 CLINICAL DATA:  Respiratory failure EXAM: PORTABLE CHEST 1 VIEW COMPARISON:  06/13/2016 FINDINGS: Cardiomediastinal silhouette is stable. Again noted mild perihilar interstitial prominence suspicious for mild interstitial edema. Endotracheal tube is unchanged in position. No segmental infiltrate. Stable right arm PICC line position. NG tube with tip in proximal stomach. IMPRESSION: Again noted mild perihilar interstitial prominence suspicious for mild interstitial edema. Endotracheal tube and NG tube is unchanged in position. No segmental infiltrate. Stable right arm PICC line position. NG tube with tip in proximal stomach. Electronically Signed   By: Lahoma Crocker M.D.   On: 06/14/2016 09:42    Medications:  Prior to Admission:  Prescriptions Prior  to Admission  Medication Sig Dispense Refill Last Dose  . aspirin EC 81 MG tablet Take 81 mg by mouth daily.   Past Week at Unknown time  . benazepril (LOTENSIN) 40 MG tablet Take 40 mg by mouth daily.   Past Week at Unknown time  . cyclobenzaprine (FLEXERIL) 10 MG tablet Take 1 tablet (10 mg total) by mouth 3 (three) times daily as needed for muscle spasms. 30 tablet 0 Past Week at Unknown time  . diazepam (VALIUM) 10 MG tablet Take 0.5 tablets (5 mg total) by mouth every 6 (six) hours as needed for anxiety (back spasms). (Patient taking differently: Take 10 mg by mouth 4 (four) times daily as needed for anxiety (back spasms). ) 30 tablet 0 Past Week at Unknown time  . doxycycline (VIBRA-TABS) 100 MG tablet Take 1 tablet (100 mg total) by mouth 2 (two) times daily. 60 tablet 11 Past Week at Unknown time  . fluticasone (FLONASE) 50 MCG/ACT nasal spray Place 1 spray into the nose daily as needed for allergies.    unknown  . NEOMYCIN-POLYMYXIN-HYDROCORTISONE (CORTISPORIN) 1 % SOLN otic solution Place 2 drops in ear(s) daily. Reported on 11/12/2015   Past Week at Unknown time  . pregabalin (LYRICA) 75 MG capsule Take 75 mg by mouth 2 (two) times daily.    Past Week at Unknown time  . tamsulosin (FLOMAX) 0.4 MG CAPS capsule Take 0.4 mg by mouth.   Past Week at Unknown time   Scheduled: . chlorhexidine gluconate (MEDLINE KIT)  15 mL Mouth Rinse BID  . Chlorhexidine Gluconate Cloth  6 each Topical Daily  . famotidine  20 mg Oral Daily  . haloperidol  5 mg Oral BID   Or  . haloperidol lactate  5 mg Intramuscular BID  . mouth rinse  15 mL Mouth Rinse QID  . pantoprazole (PROTONIX) IV  40 mg Intravenous Daily  . polyvinyl alcohol  1 drop Both Eyes TID  . senna-docusate  1 tablet Oral QHS  . sodium chloride flush  10-40 mL Intracatheter Q12H  . thiamine injection  100 mg Intravenous Daily   Continuous: . feeding supplement (VITAL AF 1.2 CAL) 1,000 mL (06/15/16 1609)  . midazolam (VERSED) infusion 2  mg/hr (06/16/16 0748)  .  phenylephrine (NEO-SYNEPHRINE) Adult infusion 50 mcg/min (06/16/16 0600)   FOY:DXAJO/INOMVEHM arterial line **AND** sodium chloride, acetaminophen **OR** acetaminophen, bisacodyl, fentaNYL (SUBLIMAZE) injection, haloperidol lactate, magnesium hydroxide, midazolam, midazolam, ondansetron **OR** ondansetron (ZOFRAN) IV, sodium chloride flush  Assesment: He was admitted with urinary tract infection with enterococcus. He developed hypovolemic shock. He is known to have neurogenic bladder. He has discitis in the lumbar region and chronic nerve disease in his legs. He had ataxic gait on admission and is felt to have [name] variant Guillain-Barr syndrome. He developed acute respiratory failure. He has malnutrition. His blood pressure has been up and down and with his overall situation I think we should put in an arterial line to see if blood pressures we are getting are accurate. Principal Problem:   UTI (urinary tract infection) due to Enterococcus Active Problems:   HTN (hypertension)   Abdominal pain, generalized   Long term current use of antibiotics   Back pain, chronic   Discitis of lumbar region   Nerve disease, peripheral (HCC)   Neurogenic urinary bladder disorder   AKI (acute kidney injury) ([place])   Ataxia   Muscle weakness (generalized)   Ataxic gait   Tachycardia   Miller-Fisher variant Guillain-Barre syndrome (HCC)   Acute respiratory failure (HCC)   Hypovolemic shock (HCC)   Malnutrition of moderate degree    Plan: Arterial line placement. Continue other treatments. He does seem calm her and more responsive so I think if his blood pressure is okay we have opportunity to try to wean him.    LOS: 13 days   Marc Rose 06/16/2016, 7:49 AM

## 2016-06-16 NOTE — Progress Notes (Signed)
OT Cancellation Note  Patient Details Name: ERIKS LENON MRN: [id number] DOB: 01-Jan-1951   Cancelled Treatment:     Reason evaluation not completed: Pt not medically ready. OT checked in on pt this am and pt was being re-intubated due to leak overnight.  Will check back on pt tomorrow.   Guadelupe Sabin, OTR/L  [phone] 06/16/2016, 4:16 PM

## 2016-06-16 NOTE — Progress Notes (Signed)
MD Luan Pulling notified of pt's inadequate tidal volumes on ventilator, RT at beside assessing assume cuff leak. Okay to exchange ETT per MD Luan Pulling. Respiratory therapist notified. MD Sarajane Jews in to assess patient condition.

## 2016-06-16 NOTE — Progress Notes (Addendum)
MD Sarajane Jews notified of pt's agitation and need for increased sedation for pt safety while on ventilator.

## 2016-06-16 NOTE — Progress Notes (Signed)
RT attempted 2 times to insert 20 gauge catheter into patient's left artery without any success, Allen's test performed and was positive. Respiratory Therapist Abigail Butts Via also attempted two additional times in the left artery to insert arterial catheter, without any success. RT held pressure for approximately 62min and MD was notified, no further orders received at this time. RT will continue to monitor.

## 2016-06-17 ENCOUNTER — Encounter (HOSPITAL_COMMUNITY): Payer: Self-pay | Admitting: Primary Care

## 2016-06-17 DIAGNOSIS — G629 Polyneuropathy, unspecified: Secondary | ICD-10-CM | POA: Diagnosis not present

## 2016-06-17 DIAGNOSIS — Z515 Encounter for palliative care: Secondary | ICD-10-CM

## 2016-06-17 DIAGNOSIS — Z7189 Other specified counseling: Secondary | ICD-10-CM

## 2016-06-17 LAB — BASIC METABOLIC PANEL
Anion gap: 4 — ABNORMAL LOW (ref 5–15)
BUN: 26 mg/dL — ABNORMAL HIGH (ref 6–20)
CHLORIDE: 106 mmol/L (ref 101–111)
CO2: 30 mmol/L (ref 22–32)
Calcium: 8.4 mg/dL — ABNORMAL LOW (ref 8.9–10.3)
Creatinine, Ser: 0.62 mg/dL (ref 0.61–1.24)
Glucose, Bld: 116 mg/dL — ABNORMAL HIGH (ref 65–99)
POTASSIUM: 3.8 mmol/L (ref 3.5–5.1)
SODIUM: 140 mmol/L (ref 135–145)

## 2016-06-17 LAB — BLOOD GAS, ARTERIAL
Acid-Base Excess: 3.1 mmol/L — ABNORMAL HIGH (ref 0.0–2.0)
Bicarbonate: 26.8 mmol/L (ref 20.0–28.0)
Drawn by: 22223
FIO2: 30
LHR: 14 {breaths}/min
O2 Saturation: 96.7 %
PEEP: 5 cmH2O
VT: 550 mL
pCO2 arterial: 46.9 mmHg (ref 32.0–48.0)
pH, Arterial: 7.39 (ref 7.350–7.450)
pO2, Arterial: 95.3 mmHg (ref 83.0–108.0)

## 2016-06-17 LAB — GLUCOSE, CAPILLARY
GLUCOSE-CAPILLARY: 106 mg/dL — AB (ref 65–99)
GLUCOSE-CAPILLARY: 113 mg/dL — AB (ref 65–99)
Glucose-Capillary: 103 mg/dL — ABNORMAL HIGH (ref 65–99)
Glucose-Capillary: 111 mg/dL — ABNORMAL HIGH (ref 65–99)
Glucose-Capillary: 105 mg/dL — ABNORMAL HIGH (ref 65–99)

## 2016-06-17 MED ORDER — PHENYLEPHRINE HCL 10 MG/ML IJ SOLN
INTRAMUSCULAR | Status: AC
  Filled 2016-06-17: qty 4

## 2016-06-17 MED ORDER — DOXYCYCLINE HYCLATE 100 MG IV SOLR
INTRAVENOUS | Status: AC
  Filled 2016-06-17: qty 100

## 2016-06-17 MED ORDER — HALOPERIDOL 2 MG PO TABS: 5.0000 mg | ORAL_TABLET | Freq: Three times a day (TID) | ORAL | Status: DC

## 2016-06-17 MED ORDER — HALOPERIDOL LACTATE 5 MG/ML IJ SOLN
5.0000 mg | Freq: Three times a day (TID) | INTRAMUSCULAR | Status: DC
  Administered 2016-06-17 – 2016-06-18 (×3): 5 mg via INTRAMUSCULAR
  Filled 2016-06-17 (×4): qty 1

## 2016-06-17 MED ORDER — ENOXAPARIN SODIUM 40 MG/0.4ML ~~LOC~~ SOLN
40.0000 mg | SUBCUTANEOUS | Status: DC
  Administered 2016-06-17 – 2016-06-25 (×9): 40 mg via SUBCUTANEOUS
  Filled 2016-06-17 (×9): qty 0.4

## 2016-06-17 NOTE — Progress Notes (Signed)
While tube feed was on hold, lowered patient's HOB to turn patient and give bath. Patient began to vomit tube feed while giving bath. Turned patient to side, raised head up to 30 degrees, and suctioned mouth with yankauer. Turned off tube feed completetly and started intermittent suctioning. Lung sounds are clear, vitals stable with HR 80s, O2 sats 99, respirations 14, BP 119/76. MD notified via text page

## 2016-06-17 NOTE — Consult Note (Signed)
Consultation Note Date: 06/17/2016   Patient Name: Marc Rose  DOB: [birth year]  MRN: [id number]  Age / Sex: 66 y.o., male  PCP: [name] Patient Referring Physician: Samuella Cota, MD  Reason for Consultation: Establishing goals of care and Psychosocial/spiritual support  HPI/Patient Profile: 66 y.o. male  with past medical history of arthritis, benign neoplasm of colon, chronic back and leg pain, diabetes, depression, hypertension, PAD, tobacco use disorder admitted on 06/03/2016 with UTI, Gilliam beret syndrome?.   Clinical Assessment and Goals of Care: Mr. Dyar is resting quietly in bed. He is intubated/ventilated, sedated and unable to communicate meet with me in any meaningful way.  He does, however look more alert, and his following commands that he has since admission. Present today at bedside is sister, Chauncy Lean, [place].  She agrees to meet with me tomorrow, as Mr. Lowry from [facility] is present to talk about admission to their long-term acute care hospital. She has no questions or concerns at this time.  Detailed conversation with Mr. Berniece Andreas from [place]. He states that he feels they can be of help to Mr. Fana. He discusses continued palliative care services will be provided at [facility], when Mr. Shaker is stable enough to participate in these discussions. Family is returning to Mr. [place]per[place] apartment later today, to search for his advanced directives/living will.   Healthcare power of attorney NEXT OF KIN - sister Chauncy Lean   SUMMARY OF RECOMMENDATIONS   full scope treatment at this time, continue intubation/ventilation, in an attempt to give Mr. Salehi time for meaningful recovery.  Code Status/Advance Care Planning:  Full code  Symptom Management:   per hospitalist  Palliative Prophylaxis:   Bowel Regimen, Frequent Pain Assessment and Oral Care  Additional  Recommendations (Limitations, Scope, Preferences):  Full Scope Treatment  Psycho-social/Spiritual:   Desire for further Chaplaincy support:no  Additional Recommendations: Caregiving  Support/Resources and ICU Family Guide  Prognosis:   Unable to determine based on outcomes.  Discharge Planning: Likely [facility] long-term for acute care tomorrow.      Primary Diagnoses: Present on Admission: . (Resolved) UTI (urinary tract infection) . (Resolved) UTI (urinary tract infection) due to Enterococcus . Back pain, chronic . Discitis of lumbar region . HTN (hypertension) . Nerve disease, peripheral ([place]) . (Resolved) Abdominal pain, generalized . Neurogenic urinary bladder disorder . (Resolved) AKI (acute kidney injury) ([place]) . Miller-Fisher variant Guillain-Barre syndrome (HCC)   I have reviewed the medical record, interviewed the patient and family, and examined the patient. The following aspects are pertinent.  Past Medical History:  Diagnosis Date  . Anxiety state, unspecified   . Arthritis   . 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 Benign neoplasm of colon   . Causalgia of upper limb    Left limb  . Chronic back pain   . Chronic leg pain   . Depression   . Diabetes mellitus   . Diskitis 02/05/2015  . Drug rash 11/12/2015  . Elevated blood pressure reading without diagnosis of hypertension   .  Essential hypertension, benign   . Hardware complicating wound infection ([place]) 02/05/2015  . Hyperpigmentation 05/15/2015  . Hypertension   . Impaired fasting glucose   . Peripheral arterial disease ([place])   . Pure hypercholesterolemia   . Tobacco use disorder   . Unspecified retinal detachment    Social History   Social History  . Marital status: Widowed    Spouse name: N/A  . Number of children: N/A  . Years of education: N/A   Social History Main Topics  . Smoking status: Current Every Day Smoker    Packs/day: 0.50    Years: 39.00    Types: Cigarettes  . Smokeless tobacco: Never Used   . Alcohol use No     Comment: occasionally  . Drug use: Yes    Types: Marijuana  . Sexual activity: Not Asked   Other Topics Concern  . None   Social History Narrative  . None   Family History  Problem Relation Age of Onset  . COPD Mother   . Hyperthyroidism Mother   . Hyperthyroidism Sister    Scheduled Meds: . chlorhexidine gluconate (MEDLINE KIT)  15 mL Mouth Rinse BID  . Chlorhexidine Gluconate Cloth  6 each Topical Daily  . doxycycline (VIBRAMYCIN) IV  100 mg Intravenous Q12H  . enoxaparin (LOVENOX) injection  40 mg Subcutaneous Q24H  . famotidine  20 mg Oral Daily  . haloperidol  5 mg Oral TID   Or  . haloperidol lactate  5 mg Intramuscular TID  . mouth rinse  15 mL Mouth Rinse QID  . pantoprazole (PROTONIX) IV  40 mg Intravenous Daily  . polyvinyl alcohol  1 drop Both Eyes TID  . QUEtiapine  50 mg Oral QHS  . senna-docusate  1 tablet Oral QHS  . sodium chloride flush  10-40 mL Intracatheter Q12H  . thiamine injection  100 mg Intravenous Daily   Continuous Infusions: . feeding supplement (VITAL AF 1.2 CAL) 1,000 mL (06/17/16 1400)  . fentaNYL infusion INTRAVENOUS 50 mcg/hr (06/17/16 1400)  . midazolam (VERSED) infusion 2 mg/hr (06/17/16 1400)  . phenylephrine (NEO-SYNEPHRINE) Adult infusion 50 mcg/min (06/17/16 1400)   PRN Meds:.Place/Maintain arterial line **AND** sodium chloride, acetaminophen **OR** acetaminophen, bisacodyl, fentaNYL, haloperidol lactate, magnesium hydroxide, midazolam, ondansetron **OR** ondansetron (ZOFRAN) IV, sodium chloride flush Medications Prior to Admission:  Prior to Admission medications   Medication Sig Start Date End Date Taking? Authorizing Provider  aspirin EC 81 MG tablet Take 81 mg by mouth daily.   Yes Historical Provider, MD  benazepril (LOTENSIN) 40 MG tablet Take 40 mg by mouth daily.   Yes Historical Provider, MD  cyclobenzaprine (FLEXERIL) 10 MG tablet Take 1 tablet (10 mg total) by mouth 3 (three) times daily as needed  for muscle spasms. 09/19/13  Yes Adeline Saralyn Pilar, MD  diazepam (VALIUM) 10 MG tablet Take 0.5 tablets (5 mg total) by mouth every 6 (six) hours as needed for anxiety (back spasms). Patient taking differently: Take 10 mg by mouth 4 (four) times daily as needed for anxiety (back spasms).  09/19/13  Yes Adeline Saralyn Pilar, MD  doxycycline (VIBRA-TABS) 100 MG tablet Take 1 tablet (100 mg total) by mouth 2 (two) times daily. 11/27/15  Yes Truman Hayward, MD  fluticasone [facility]) 50 MCG/ACT nasal spray Place 1 spray into the nose daily as needed for allergies.  07/04/14  Yes Historical Provider, MD  NEOMYCIN-POLYMYXIN-HYDROCORTISONE (CORTISPORIN) 1 % SOLN otic solution Place 2 drops in ear(s) daily. Reported on 11/12/2015 07/04/14  Yes Historical Provider, MD  pregabalin (LYRICA) 75 MG capsule Take 75 mg by mouth 2 (two) times daily.  05/07/13  Yes Historical Provider, MD  tamsulosin (FLOMAX) 0.4 MG CAPS capsule Take 0.4 mg by mouth.   Yes Historical Provider, MD   Allergies  Allergen Reactions  . Codeine Anaphylaxis, Hives and Swelling  . Penicillins Anaphylaxis, Hives and Swelling  . Latex Itching  . Strawberry Extract Hives   Review of Systems  Unable to perform ROS: Intubated    Physical Exam  Constitutional: No distress.  Frail, acutely ill appearing  HENT:  Head: Normocephalic and atraumatic.  Cardiovascular:  Increased rate at times  Pulmonary/Chest:  Intubated/ventilated  Abdominal: Soft. He exhibits no distension.  Musculoskeletal: He exhibits no edema.  Neurological:  Sedated, but spontaneously opens eyes, able to follow commands  Skin: Skin is warm and dry.  Multiple areas of bruising, healing scabs  Nursing note and vitals reviewed.   Vital Signs: BP (!) 85/57   Pulse (!) 155   Temp 99.2 F (37.3 C) (Axillary)   Resp 14   Ht '6\' 2"'  (1.88 m)   Wt 74.9 kg (165 lb 2 oz)   SpO2 100%   BMI 21.20 kg/m  Pain Assessment: CPOT POSS *See Group Information*: 1-Acceptable,Awake  and alert Pain Score: 6  (patient nodded that he was in pain)   SpO2: SpO2: 100 % O2 Device:SpO2: 100 % O2 Flow Rate: .   IO: Intake/output summary:  Intake/Output Summary (Last 24 hours) at 06/17/16 1552 Last data filed at 06/17/16 1400  Gross per 24 hour  Intake          2227.24 ml  Output             1950 ml  Net           277.24 ml    LBM: Last BM Date: 06/17/16 Baseline Weight: Weight: 74.8 kg (165 lb) Most recent weight: Weight: 74.9 kg (165 lb 2 oz)     Palliative Assessment/Data:   Flowsheet Rows   Flowsheet Row Most Recent Value  Intake Tab  Referral Department  Hospitalist  Unit at Time of Referral  ICU  Palliative Care Primary Diagnosis  Neurology  Date Notified  06/17/16  Palliative Care Type  New Palliative care  Reason for referral  Clarify Goals of Care  Date of Admission  06/03/16  Date first seen by Palliative Care  06/17/16  # of days Palliative referral response time  0 Day(s)  # of days IP prior to Palliative referral  14  Clinical Assessment  Palliative Performance Scale Score  20%  Pain Max last 24 hours  Not able to report  Pain Min Last 24 hours  Not able to report  Dyspnea Max Last 24 Hours  Not able to report  Dyspnea Min Last 24 hours  Not able to report  Psychosocial & Spiritual Assessment  Palliative Care Outcomes  Patient/Family meeting held?  Yes  Who was at the meeting?  With sister, Chauncy Lean, at bedside  [place]  Provided psychosocial or spiritual support  Palliative Care follow-up planned  -- [Follow-up while at [facility]]      Time In: 1330 Time Out: 1405 Time Total: 35 minutes Greater than 50%  of this time was spent counseling and coordinating care related to the above assessment and plan.  Signed by: Drue Novel, NP   Please contact Palliative Medicine Team phone at [phone] for questions and concerns.  For  individual provider: See Shea Evans

## 2016-06-17 NOTE — Clinical Social Work Note (Signed)
CSW met with pt's sister, [name] and brother-in-law. They state they are very interested in LTAC for pt. Discussed decision making as siblings are next of kin. [name] states pt does not have HCPOA. They plan to be in town for the next few days to assist as needed.   Benay Pike, [place]

## 2016-06-17 NOTE — Progress Notes (Signed)
PROGRESS NOTE  Marc Rose VZD:[id number] DOB: 21-Oct-1950 DOA: 06/03/2016 PCP: [name] Patient  Brief Narrative: 66 year old man multiple medical problems presented with worsening bilateral lower extremity weakness resulting in multiple falls and traumatic skin tears on the feet and knees, also reported severe abdominal pain with fullness sensation in lower abdomen. Admitted for UTI, acute kidney injury, generalized weakness. He was evaluated by therapy who recommended skilled nursing facility. 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 He was treated with IVIG. Approximately 1 week into the hospitalization he developed acute encephalopathy with decreased mentation, was transferred to the stepdown unit and started on Precedex. He quickly decompensated and was intubated 2/7. He subsequently developed hypotension requiring vasopressor support.  Assessment/Plan 1. Acute hypoxic respiratory failure requiring ventilator support  FiO2 30%. Although he vomited, saturations have been good and there is no evidence of complication. Weaning attempts have been complicated in the past by hypoxia and agitation. Hopefully can wean today. Defer management to pulmonology.  2. [name] variant of GBS, status post IVIG treatment  3. Hypotension requiring vasopressor support. Etiology and significance remains unclear, may be artifactual. When he has been hypotensive he has remained responsive. Attempts to place arterial line yesterday were unsuccessful. In general he has been stable off vasopressors. Hopefully can wean off today. Again there is no evidence of sepsis or hemodynamic compromise. Echocardiogram this admission was reassuring.  Wean vasopressors as tolerated. Given general a normal blood pressure think we can avoid arterial line at this time.  4. Acute encephalopathy thought to be toxic metabolic in nature.  Appears to be resolving. He certainly follows commands at this point. However he's had marked agitation at times, likely a component of chronic pain exacerbated by bedbound status.  5. Acute kidney injury secondary to prerenal azotemia, resolved.  6. Hyponatremia secondary to hypovolemic status. Resolved.  7. Diabetes mellitus remains stable.  8. PMH multiple lumbar/spinal surgeries, chronic lumbar discitis and osteomyelitis on suppressive therapy with doxycycline, urinary retention requiring self bladder catheterization since 2014  9. Chronic lower abdominal pain. CT abdomen and pelvis no acute finding. Protonix for esophagitis. 10. 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 Reported progressive spasticity arms and legs. 11. Peripheral arterial disease 12. Status post enterococcus UTI associated with outpatient in/out self bladder catheterizations 13. Moderate malnutrition 14. Manubrial fracture seen on chest CT. Follow-up as an outpatient. 15. Right upper lobe nodule. Consider chest CT in 12 months. 16. Aortic atherosclerosis   Hopefully can wean, further management per pulmonology. Resume tube feeds today and monitor.  He appears to be a good candidate for LTAC as I suspect weaning process may be prolonged secondary to respiratory muscle weakness.  Long discussion yesterday with sister at bedside. We discussed the hospitalization up to yesterday and multiple complicating factors. We discussed uncertain and guarded prognosis and that he may not recover or may worsen. We discussed he may be unable to extubate and tracheostomy may be necessary. We discussed involving palliative care to help support goals of care ongoing as well as possible LTAC. I asked them to involve all family members interested in participating in care as there is no power of attorney. Patient has no children and his wife is  deceased.  DVT proph: Lovenox Code Status: full code Family Communication: As above Disposition Plan: pending   [name], MD  [facility] Direct contact: [phone] --  Via [employer] app OR  --[email or web address]; password [email or web address]  7PM-7AM contact night coverage as above 06/17/2016, 10:40 AM  LOS: 14 days   Consultants:  Neurology  Pulmonology  Procedures:  Intubation 2/7>>   Lumbar puncture 2/8  EEG IMPRESSION: This is a normal recording of the awake and drowsy states.  Echo Study Conclusions  - Left ventricle: The cavity size was normal. Wall thickness was   increased in a pattern of mild LVH. Systolic function was normal.   The estimated ejection fraction was in the range of 60% to 65%.   Doppler parameters are consistent with abnormal left ventricular   relaxation (grade 1 diastolic dysfunction). - Aortic valve: Calcified non coronary cusp. Valve area (VTI): 1.83   cm^2. Valve area (Vmax): 1.83 cm^2. Valve area (Vmean): 1.84   cm^2. - Atrial septum: No defect or patent foramen ovale was identified. - Pulmonary arteries: PA peak pressure: 39 mm Hg (S). Antimicrobials:  None today  Interval history/Subjective: Vomited overnight (tube feeds). No evidence of aspiration. Discussed with nursing this morning. Episodes of hypotension noted. Arterial line could not be placed yesterday  Objective: Vitals:   06/17/16 0830 06/17/16 0845 06/17/16 0900 06/17/16 0940  BP: 102/66 129/80 126/83   Pulse: 83     Resp: '19 16 14   ' Temp:      TempSrc:      SpO2: 100%   100%  Weight:      Height:        Intake/Output Summary (Last 24 hours) at 06/17/16 1040 Last data filed at 06/17/16 1001  Gross per 24 hour  Intake          2534.15 ml  Output             1900 ml  Net           634.15 ml     Filed Weights   06/14/16 0500 06/15/16 0450 06/16/16 0500  Weight: 73.7 kg (162 lb 7.7 oz) 75.2 kg (165 lb 12.6 oz) 74.9 kg (165 lb 2 oz)    Exam:     Constitutional:   Intubated, currently on Neo-Synephrine, Versed and fentanyl infusion. However awake, alert and follows commands. Eyes:   Pupils, irises, lids appear unremarkable. ENMT:   Intubated. Limited view of lips appear unremarkable. Hearing grossly normal. Neck:   Grossly unremarkable Respiratory:   Clear to auscultation bilaterally no wheezes, rales or rhonchi. Appears to be tolerating ventilator well. Cardiovascular:   Regular rate and rhythm. No murmur, rub or gallop. No lower extremity edema.  Telemetry sinus rhythm Abdomen:   Soft, nontender, nondistended. Appears grossly unremarkable. Musculoskeletal:   Moves all extremities to command. Skin:   Multiple abrasions of the lower extremities again noted Neurologic:   Follows simple commands Psychiatric:   Alert, follows simple commands     I have personally reviewed following labs and imaging studies:  Urine output 1900  +5 L  Basic metabolic panel unremarkable  Scheduled Meds: . chlorhexidine gluconate (MEDLINE KIT)  15 mL Mouth Rinse BID  . Chlorhexidine Gluconate Cloth  6 each Topical Daily  . doxycycline (VIBRAMYCIN) IV  100 mg Intravenous Q12H  . famotidine  20 mg Oral Daily  . haloperidol  5 mg Oral TID   Or  . haloperidol lactate  5 mg Intramuscular TID  . mouth rinse  15 mL Mouth Rinse QID  . pantoprazole (PROTONIX) IV  40 mg Intravenous Daily  . polyvinyl alcohol  1 drop Both Eyes TID  .  QUEtiapine  50 mg Oral QHS  . senna-docusate  1 tablet Oral QHS  . sodium chloride flush  10-40 mL Intracatheter Q12H  . thiamine injection  100 mg Intravenous Daily   Continuous Infusions: . feeding supplement (VITAL AF 1.2 CAL) Stopped (06/17/16 0330)  . fentaNYL infusion INTRAVENOUS 180 mcg/hr (06/17/16 0730)  . midazolam (VERSED) infusion 2 mg/hr (06/17/16 7209)  . phenylephrine (NEO-SYNEPHRINE) Adult infusion 50 mcg/min (06/17/16 1019)    Principal Problem:   Acute respiratory failure  (HCC) Active Problems:   HTN (hypertension)   Long term current use of antibiotics   Back pain, chronic   Discitis of lumbar region   Nerve disease, peripheral (HCC)   Neurogenic urinary bladder disorder   Ataxia   Muscle weakness (generalized)   Ataxic gait   Miller-Fisher variant Guillain-Barre syndrome (HCC)   Malnutrition of moderate degree   Acute encephalopathy   LOS: 14 days

## 2016-06-17 NOTE — Progress Notes (Signed)
OT Cancellation Note  Patient Details Name: Marc Rose MRN: [id number] DOB: [birth year]   Cancelled Treatment:     Reason evaluation not completed: Pt not medically ready: OT attempted to see pt this am, however nsg staff reported they were planning to attempt to wean pt this am. OT unable to return this afternoon-chart review notes PT attempted session however pt pain limited and unable to participate with PT. Will attempt to see pt tomorrow.   Guadelupe Sabin, OTR/L  [phone] 06/17/2016, 4:44 PM

## 2016-06-17 NOTE — Progress Notes (Signed)
Patient BP dropped, lowest 55/35, stopped sedation. E link nurse notified, Neo restarted and titrated. Sedation restarted at lower rate.

## 2016-06-17 NOTE — Progress Notes (Signed)
Subjective: He is more alert this morning. He apparently had been somewhat agitated in his sedation has been turned up. His blood pressure is in the 802 systolic for the last several measurements so hopefully we can get the Neo-Synephrine turned off. No other new problems noted except the difficulty with tube feeding.  Objective: Vital signs in last 24 hours: Temp:  [97.5 F (36.4 C)-99.4 F (37.4 C)] 97.5 F (36.4 C) (02/15 0726) Pulse Rate:  [81-204] 101 (02/15 0726) Resp:  [10-27] 14 (02/15 0726) BP: (55-162)/(32-99) 113/73 (02/15 0600) SpO2:  [92 %-100 %] 100 % (02/15 0726) FiO2 (%):  [30 %] 30 % (02/15 0223) Weight change:  Last BM Date: 06/16/16  Intake/Output from previous day: 02/14 0701 - 02/15 0700 In: 2619.2 [I.V.:621.7; MV/VK:1224.4; IV Piggyback:500] Out: 1900 [Urine:1900]  PHYSICAL EXAM General appearance: More alert following commands intubated with sedation on. Resp: clear to auscultation bilaterally Cardio: regular rate and rhythm, S1, S2 normal, no murmur, click, rub or gallop GI: soft, non-tender; bowel sounds normal; no masses,  no organomegaly Extremities: extremities normal, atraumatic, no cyanosis or edema Multiple skin lesions and bruises on his legs. Mucous membranes are moist. Pupils react.  Lab Results:  Results for orders placed or performed during the hospital encounter of 06/03/16 (from the past 48 hour(s))  Glucose, capillary     Status: Abnormal   Collection Time: 06/15/16 11:19 AM  Result Value Ref Range   Glucose-Capillary 121 (H) 65 - 99 mg/dL  Glucose, capillary     Status: Abnormal   Collection Time: 06/15/16  4:07 PM  Result Value Ref Range   Glucose-Capillary 129 (H) 65 - 99 mg/dL  Glucose, capillary     Status: Abnormal   Collection Time: 06/15/16  7:33 PM  Result Value Ref Range   Glucose-Capillary 128 (H) 65 - 99 mg/dL   Comment 1 Notify RN   Glucose, capillary     Status: Abnormal   Collection Time: 06/15/16 11:56 PM  Result  Value Ref Range   Glucose-Capillary 116 (H) 65 - 99 mg/dL   Comment 1 Notify RN   Basic metabolic panel     Status: Abnormal   Collection Time: 06/16/16  4:24 AM  Result Value Ref Range   Sodium 142 135 - 145 mmol/L   Potassium 3.8 3.5 - 5.1 mmol/L   Chloride 106 101 - 111 mmol/L   CO2 29 22 - 32 mmol/L   Glucose, Bld 144 (H) 65 - 99 mg/dL   BUN 31 (H) 6 - 20 mg/dL   Creatinine, Ser 0.66 0.61 - 1.24 mg/dL   Calcium 8.5 (L) 8.9 - 10.3 mg/dL   GFR calc non Af Amer >60 >60 mL/min   GFR calc Af Amer >60 >60 mL/min    Comment: (NOTE) The eGFR has been calculated using the CKD EPI equation. This calculation has not been validated in all clinical situations. eGFR's persistently <60 mL/min signify possible Chronic Kidney Disease.    Anion gap 7 5 - 15  CBC     Status: Abnormal   Collection Time: 06/16/16  4:24 AM  Result Value Ref Range   WBC 8.7 4.0 - 10.5 K/uL   RBC 3.16 (L) 4.22 - 5.81 MIL/uL   Hemoglobin 10.5 (L) 13.0 - 17.0 g/dL   HCT 30.9 (L) 39.0 - 52.0 %   MCV 97.8 78.0 - 100.0 fL   MCH 33.2 26.0 - 34.0 pg   MCHC 34.0 30.0 - 36.0 g/dL   RDW 16.4 (H)  11.5 - 15.5 %   Platelets 341 150 - 400 K/uL  Glucose, capillary     Status: Abnormal   Collection Time: 06/16/16  4:24 AM  Result Value Ref Range   Glucose-Capillary 134 (H) 65 - 99 mg/dL  Blood gas, arterial     Status: Abnormal   Collection Time: 06/16/16  4:54 AM  Result Value Ref Range   FIO2 0.30    O2 Content 30.0 L/min   Delivery systems VENTILATOR    Mode PRESSURE REGULATED VOLUME CONTROL    VT 550 mL   LHR 14.0 resp/min   Peep/cpap 5.0 cm H20   pH, Arterial 7.463 (H) 7.350 - 7.450   pCO2 arterial 38.7 32.0 - 48.0 mmHg   pO2, Arterial 110 (H) 83.0 - 108.0 mmHg   Bicarbonate 27.8 20.0 - 28.0 mmol/L   Acid-Base Excess 3.7 (H) 0.0 - 2.0 mmol/L   O2 Saturation 98.1 %   Collection site LEFT RADIAL    Drawn by [id number]    Sample type ARTERIAL DRAW    Allens test (pass/fail) PASS PASS  Glucose, capillary      Status: Abnormal   Collection Time: 06/16/16  7:54 AM  Result Value Ref Range   Glucose-Capillary 122 (H) 65 - 99 mg/dL   Comment 1 Notify RN   Glucose, capillary     Status: Abnormal   Collection Time: 06/16/16 11:11 AM  Result Value Ref Range   Glucose-Capillary 127 (H) 65 - 99 mg/dL   Comment 1 Notify RN   Glucose, capillary     Status: Abnormal   Collection Time: 06/16/16  4:28 PM  Result Value Ref Range   Glucose-Capillary 118 (H) 65 - 99 mg/dL   Comment 1 Notify RN   Glucose, capillary     Status: Abnormal   Collection Time: 06/16/16  8:18 PM  Result Value Ref Range   Glucose-Capillary 106 (H) 65 - 99 mg/dL   Comment 1 Notify RN    Comment 2 Document in Chart   Glucose, capillary     Status: Abnormal   Collection Time: 06/17/16  2:08 AM  Result Value Ref Range   Glucose-Capillary 103 (H) 65 - 99 mg/dL  Blood gas, arterial     Status: Abnormal   Collection Time: 06/17/16  4:30 AM  Result Value Ref Range   FIO2 30.00    Delivery systems VENTILATOR    Mode PRESSURE REGULATED VOLUME CONTROL    VT 550 mL   LHR 14 resp/min   Peep/cpap 5.0 cm H20   pH, Arterial 7.390 7.350 - 7.450   pCO2 arterial 46.9 32.0 - 48.0 mmHg   pO2, Arterial 95.3 83.0 - 108.0 mmHg   Bicarbonate 26.8 20.0 - 28.0 mmol/L   Acid-Base Excess 3.1 (H) 0.0 - 2.0 mmol/L   O2 Saturation 96.7 %   Collection site RIGHT RADIAL    Drawn by [id number]    Sample type ARTERIAL    Allens test (pass/fail) PASS PASS  Glucose, capillary     Status: Abnormal   Collection Time: 06/17/16  7:25 AM  Result Value Ref Range   Glucose-Capillary 111 (H) 65 - 99 mg/dL    ABGS  Recent Labs  06/17/16 0430  PHART 7.390  PO2ART 95.3  HCO3 26.8   CULTURES Recent Results (from the past 240 hour(s))  MRSA PCR Screening     Status: None   Collection Time: 06/09/16  5:00 PM  Result Value Ref Range Status   MRSA by PCR  NEGATIVE NEGATIVE Final    Comment:        The GeneXpert MRSA Assay (FDA approved for NASAL  specimens only), is one component of a comprehensive MRSA colonization surveillance program. It is not intended to diagnose MRSA infection nor to guide or monitor treatment for MRSA infections.   Culture, blood (routine x 2)     Status: None   Collection Time: 06/09/16  7:15 PM  Result Value Ref Range Status   Specimen Description BLOOD RIGHT HAND  Final   Special Requests BOTTLES DRAWN AEROBIC AND ANAEROBIC 6CC  Final   Culture NO GROWTH 6 DAYS  Final   Report Status 06/15/2016 FINAL  Final  Culture, blood (routine x 2)     Status: None   Collection Time: 06/09/16  7:20 PM  Result Value Ref Range Status   Specimen Description BLOOD LEFT HAND  Final   Special Requests BOTTLES DRAWN AEROBIC AND ANAEROBIC 6CC  Final   Culture NO GROWTH 6 DAYS  Final   Report Status 06/15/2016 FINAL  Final  CSF culture     Status: None   Collection Time: 06/10/16  8:00 AM  Result Value Ref Range Status   Specimen Description CSF  Final   Special Requests Normal  Final   Gram Stain   Final    NO ORGANISMS SEEN NO WBC SEEN CYTOSPIN SMEAR Gram Stain Report Called to,Read Back By and Verified With: WILLIE E. AT [id number] ON [id number] BY THOMPSON S.    Culture   Final    NO GROWTH 3 DAYS Performed at [facility], [employer] [address], [place], [place] [address]    Report Status 06/13/2016 FINAL  Final   Studies/Results: Dg Chest 1 View  Result Date: 06/16/2016 CLINICAL DATA:  Status post re-intubation EXAM: CHEST 1 VIEW COMPARISON:  06/16/2016 FINDINGS: Endotracheal tube is again noted approximately 7.2 cm above the carina. The right-sided PICC line and nasogastric catheter are again seen stable. Postsurgical changes in the thoracolumbar spine are seen. The lungs are well aerated bilaterally. No acute bony abnormality is seen. IMPRESSION: No acute abnormality noted. Electronically Signed   By: Inez Catalina M.D.   On: 06/16/2016 09:34   Dg Chest Port 1 View  Result Date: 06/16/2016 CLINICAL DATA:  On  ventilator, respiratory failure EXAM: PORTABLE CHEST 1 VIEW COMPARISON:  06/15/2016 FINDINGS: Cardiomediastinal silhouette is stable. No infiltrate or pleural effusion. No pulmonary edema. Endotracheal tube in place with tip 6.4 cm above the carina. Right arm PICC line with tip in right atrium. No pneumothorax. NG tube in place. IMPRESSION: No active disease.  Stable support apparatus. Electronically Signed   By: Lahoma Crocker M.D.   On: 06/16/2016 07:58    Medications:  Prior to Admission:  Prescriptions Prior to Admission  Medication Sig Dispense Refill Last Dose  . aspirin EC 81 MG tablet Take 81 mg by mouth daily.   Past Week at Unknown time  . benazepril (LOTENSIN) 40 MG tablet Take 40 mg by mouth daily.   Past Week at Unknown time  . cyclobenzaprine (FLEXERIL) 10 MG tablet Take 1 tablet (10 mg total) by mouth 3 (three) times daily as needed for muscle spasms. 30 tablet 0 Past Week at Unknown time  . diazepam (VALIUM) 10 MG tablet Take 0.5 tablets (5 mg total) by mouth every 6 (six) hours as needed for anxiety (back spasms). (Patient taking differently: Take 10 mg by mouth 4 (four) times daily as needed for anxiety (back spasms). )  30 tablet 0 Past Week at Unknown time  . doxycycline (VIBRA-TABS) 100 MG tablet Take 1 tablet (100 mg total) by mouth 2 (two) times daily. 60 tablet 11 Past Week at Unknown time  . fluticasone (FLONASE) 50 MCG/ACT nasal spray Place 1 spray into the nose daily as needed for allergies.    unknown  . NEOMYCIN-POLYMYXIN-HYDROCORTISONE (CORTISPORIN) 1 % SOLN otic solution Place 2 drops in ear(s) daily. Reported on 11/12/2015   Past Week at Unknown time  . pregabalin (LYRICA) 75 MG capsule Take 75 mg by mouth 2 (two) times daily.    Past Week at Unknown time  . tamsulosin (FLOMAX) 0.4 MG CAPS capsule Take 0.4 mg by mouth.   Past Week at Unknown time   Scheduled: . chlorhexidine gluconate (MEDLINE KIT)  15 mL Mouth Rinse BID  . Chlorhexidine Gluconate Cloth  6 each Topical  Daily  . doxycycline (VIBRAMYCIN) IV  100 mg Intravenous Q12H  . famotidine  20 mg Oral Daily  . haloperidol  5 mg Oral BID   Or  . haloperidol lactate  5 mg Intramuscular BID  . mouth rinse  15 mL Mouth Rinse QID  . pantoprazole (PROTONIX) IV  40 mg Intravenous Daily  . polyvinyl alcohol  1 drop Both Eyes TID  . QUEtiapine  50 mg Oral QHS  . senna-docusate  1 tablet Oral QHS  . sodium chloride flush  10-40 mL Intracatheter Q12H  . thiamine injection  100 mg Intravenous Daily   Continuous: . feeding supplement (VITAL AF 1.2 CAL) Stopped (06/17/16 0330)  . fentaNYL infusion INTRAVENOUS 175 mcg/hr (06/17/16 0615)  . midazolam (VERSED) infusion 2 mg/hr (06/17/16 1655)  . phenylephrine (NEO-SYNEPHRINE) Adult infusion 60 mcg/min (06/17/16 3748)   OLM:BEMLJ/QGBEEFEO arterial line **AND** sodium chloride, acetaminophen **OR** acetaminophen, bisacodyl, fentaNYL, haloperidol lactate, magnesium hydroxide, midazolam, ondansetron **OR** ondansetron (ZOFRAN) IV, sodium chloride flush  Assesment: He was admitted with presumed urinary tract infection. 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 He has had significant issues with encephalopathy and still apparently is somewhat agitated. He has Haldol available if needed. He had some trouble with tube feedings last night. He had to have his endotracheal tube changed out yesterday. He is still on pressors but his blood pressures in the 150s so we may be able to get him  off pressors and then we can make an attempt weaning. Principal Problem:   Acute respiratory failure (HCC) Active Problems:   HTN (hypertension)   Long term current use of antibiotics   Back pain, chronic   Discitis of lumbar region   Nerve disease, peripheral (HCC)   Neurogenic urinary bladder disorder   Ataxia   Muscle weakness (generalized)   Ataxic gait   Miller-Fisher variant Guillain-Barre syndrome (HCC)    Malnutrition of moderate degree   Acute encephalopathy    Plan: As above    LOS: 14 days   Albeiro Trompeter L 06/17/2016, 8:16 AM

## 2016-06-17 NOTE — Progress Notes (Signed)
[place] RN FROM [facility] IN TO TALK WITH PT AND HIS SISTER CONCERNING [facility].

## 2016-06-17 NOTE — Progress Notes (Signed)
RT attempted to wean patient on CPAP and Pressure Support of 10/5 with 30% oxygen. Patient was unable to maintain respiratory rate due to somnolence, despite decreased sedation. When he was prompted he is able to achieve adequate tidal volumes but the respiratory rate would wane ( to < 8 breaths/min and backup mode would be initiated. )  He did have a vital capacity of 1.7L and -NIF of 12. Weaning discontinued when patient communicated he was in pain and because BP showing a downward trend. Patient was returned to rest mode, he will be assessed later to day to see if we can try to wean again.

## 2016-06-17 NOTE — Progress Notes (Signed)
PT Cancellation Note  Patient Details Name: Marc Rose MRN: [id number] DOB: Mar 02, 1951   Cancelled Treatment:    Reason Eval/Treat Not Completed: Other (comment);Pain limiting ability to participate (Attempted to see pt this AM, however spoke with [name], RN who stated they were planning to attempt weaning him this morning.  Returned back this afternoon, and pt was initially agreeable to PT, however after a few reps of bed exercises he was grimacing and was able to express that he was having too much pain, and he did not want to continue today.  RN expressed that he had recently received pain medication.  Pt is agreeable to have PT return tomorrow.)   Tacy Learn, PT, DPT X: [phone]

## 2016-06-17 NOTE — Progress Notes (Signed)
Marc Rose A. Marc Laughter, MD     [email or web address]          Marc Rose is an 66 y.o. male.   ASSESSMENT/PLAN:      Severe delirium/encephalopathy of unclear etiology at this time. We are suspecting that this may be multifactorial including UTI and medication effect from Levaquin. Levaquin has been discontinued. The picture seems most consistent with toxic metabolic encephalopathy/delirium as opposed to primary and CNS problem. The patient's spinal fluid analysis shows the typical formula with elevated protein and essentially unremarkable white cell count (cytoalbuminologic dissociation) seen in demyelinating conditions such as Guillain-Barr syndrome. The CSF does not show evidence of central nervous system infection. Attempted wean off the ventilator this weekend with associated with the patient becoming quite restless. I recommend that agitation be treated with when necessary doses of Haldol. I will increase his maintenance dose of Haldol. Will continue with the Seroquel.    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 Completed 5 day course of IV IG    Low back pain status post L1-L2 laminectomy. HX of chronic discitis      Attempted wean this weekend. He is still on sedation but less today. There are episodes of hypotension yesterday.              The patient appears to be doing better overall. He is more responsive and follows commands more consistently. Unfortunately, he has episodes of severe agitation requiring the patient to be placed on sedation. This is often associated with hypotension.  GENERAL: He is intubated.   HEENT: Supple. Atraumatic normocephalic. Dense cataracts both sides.  ABDOMEN: soft  EXTREMITIES: No edema. Multiple abrasions and lacerations involving the knees and legs.   BACK: Normal.  SKIN: Normal by  inspection.    MENTAL STATUS: The patient does opens his eyes to verbal commands. This is done consistently. He does follow appendicular commands although somewhat more inconsistently.  CRANIAL NERVES: Pupils are mid position and reactive. He seems to have full extraocular movements. Facial muscles are symmetric.   MOTOR: Moves both sides well.  COORDINATION: No tremor or dysmetria  REFLEXES: The patient is areflexic in the legs and upper extremities.    EEG normal  Blood pressure (!) 82/55, pulse 83, temperature 99.2 F (37.3 C), temperature source Axillary, resp. rate 14, height _0  (1.88 m), weight 165 lb 2 oz (74.9 kg), SpO2 100 %.  Past Medical History:  Diagnosis Date  . Anxiety state, unspecified   . Arthritis   . 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 Benign neoplasm of colon   . Causalgia of upper limb    Left limb  . Chronic back pain   . Chronic leg pain   . Depression   . Diabetes mellitus   . Diskitis 02/05/2015  . Drug rash 11/12/2015  . Elevated blood pressure reading without diagnosis of hypertension   . Essential hypertension, benign   . Hardware complicating wound infection ([place]) 02/05/2015  . Hyperpigmentation 05/15/2015  . Hypertension   . Impaired fasting glucose   . Peripheral arterial disease ([place])   . Pure hypercholesterolemia   . Tobacco use disorder   . Unspecified retinal detachment     Past Surgical History:  Procedure Laterality Date  . ANKLE SURGERY     left  . APPENDECTOMY    . BACK SURGERY    . COLONOSCOPY    . KNEE SURGERY  left    Family History  Problem Relation Age of Onset  . COPD Mother   . Hyperthyroidism Mother   . Hyperthyroidism Sister     Social History:  reports that he has been smoking Cigarettes.  He has a 19.50 pack-year smoking history. He has never used smokeless tobacco. He reports that he uses drugs, including Marijuana. He reports that he does not drink alcohol.  Allergies:  Allergies  Allergen Reactions  . Codeine  Anaphylaxis, Hives and Swelling  . Penicillins Anaphylaxis, Hives and Swelling  . Latex Itching  . Strawberry Extract Hives    Medications: Prior to Admission medications   Medication Sig Start Date End Date Taking? Authorizing Provider  aspirin EC 81 MG tablet Take 81 mg by mouth daily.   Yes Historical Provider, MD  benazepril (LOTENSIN) 40 MG tablet Take 40 mg by mouth daily.   Yes Historical Provider, MD  cyclobenzaprine (FLEXERIL) 10 MG tablet Take 1 tablet (10 mg total) by mouth 3 (three) times daily as needed for muscle spasms. 09/19/13  Yes Adeline Saralyn Pilar, MD  diazepam (VALIUM) 10 MG tablet Take 0.5 tablets (5 mg total) by mouth every 6 (six) hours as needed for anxiety (back spasms). Patient taking differently: Take 10 mg by mouth 4 (four) times daily as needed for anxiety (back spasms).  09/19/13  Yes Adeline Saralyn Pilar, MD  doxycycline (VIBRA-TABS) 100 MG tablet Take 1 tablet (100 mg total) by mouth 2 (two) times daily. 11/27/15  Yes Truman Hayward, MD  fluticasone [facility]) 50 MCG/ACT nasal spray Place 1 spray into the nose daily as needed for allergies.  07/04/14  Yes Historical Provider, MD  NEOMYCIN-POLYMYXIN-HYDROCORTISONE (CORTISPORIN) 1 % SOLN otic solution Place 2 drops in ear(s) daily. Reported on 11/12/2015 07/04/14  Yes Historical Provider, MD  pregabalin (LYRICA) 75 MG capsule Take 75 mg by mouth 2 (two) times daily.  05/07/13  Yes Historical Provider, MD  tamsulosin (FLOMAX) 0.4 MG CAPS capsule Take 0.4 mg by mouth.   Yes Historical Provider, MD    Scheduled Meds: . chlorhexidine gluconate (MEDLINE KIT)  15 mL Mouth Rinse BID  . Chlorhexidine Gluconate Cloth  6 each Topical Daily  . doxycycline (VIBRAMYCIN) IV  100 mg Intravenous Q12H  . enoxaparin (LOVENOX) injection  40 mg Subcutaneous Q24H  . famotidine  20 mg Oral Daily  . haloperidol  5 mg Oral TID   Or  . haloperidol lactate  5 mg Intramuscular TID  . mouth rinse  15 mL Mouth Rinse QID  . pantoprazole  (PROTONIX) IV  40 mg Intravenous Daily  . polyvinyl alcohol  1 drop Both Eyes TID  . QUEtiapine  50 mg Oral QHS  . senna-docusate  1 tablet Oral QHS  . sodium chloride flush  10-40 mL Intracatheter Q12H  . thiamine injection  100 mg Intravenous Daily   Continuous Infusions: . feeding supplement (VITAL AF 1.2 CAL) 1,000 mL (06/17/16 1214)  . fentaNYL infusion INTRAVENOUS 50 mcg/hr (06/17/16 1234)  . midazolam (VERSED) infusion 1 mg/hr (06/17/16 1125)  . phenylephrine (NEO-SYNEPHRINE) Adult infusion 50 mcg/min (06/17/16 1127)   PRN Meds:.Place/Maintain arterial line **AND** sodium chloride, acetaminophen **OR** acetaminophen, bisacodyl, fentaNYL, haloperidol lactate, magnesium hydroxide, midazolam, ondansetron **OR** ondansetron (ZOFRAN) IV, sodium chloride flush     Results for orders placed or performed during the hospital encounter of 06/03/16 (from the past 48 hour(s))  Glucose, capillary     Status: Abnormal   Collection Time: 06/15/16  4:07 PM  Result  Value Ref Range   Glucose-Capillary 129 (H) 65 - 99 mg/dL  Glucose, capillary     Status: Abnormal   Collection Time: 06/15/16  7:33 PM  Result Value Ref Range   Glucose-Capillary 128 (H) 65 - 99 mg/dL   Comment 1 Notify RN   Glucose, capillary     Status: Abnormal   Collection Time: 06/15/16 11:56 PM  Result Value Ref Range   Glucose-Capillary 116 (H) 65 - 99 mg/dL   Comment 1 Notify RN   Basic metabolic panel     Status: Abnormal   Collection Time: 06/16/16  4:24 AM  Result Value Ref Range   Sodium 142 135 - 145 mmol/L   Potassium 3.8 3.5 - 5.1 mmol/L   Chloride 106 101 - 111 mmol/L   CO2 29 22 - 32 mmol/L   Glucose, Bld 144 (H) 65 - 99 mg/dL   BUN 31 (H) 6 - 20 mg/dL   Creatinine, Ser 0.66 0.61 - 1.24 mg/dL   Calcium 8.5 (L) 8.9 - 10.3 mg/dL   GFR calc non Af Amer >60 >60 mL/min   GFR calc Af Amer >60 >60 mL/min    Comment: (NOTE) The eGFR has been calculated using the CKD EPI equation. This calculation has not  been validated in all clinical situations. eGFR's persistently <60 mL/min signify possible Chronic Kidney Disease.    Anion gap 7 5 - 15  CBC     Status: Abnormal   Collection Time: 06/16/16  4:24 AM  Result Value Ref Range   WBC 8.7 4.0 - 10.5 K/uL   RBC 3.16 (L) 4.22 - 5.81 MIL/uL   Hemoglobin 10.5 (L) 13.0 - 17.0 g/dL   HCT 30.9 (L) 39.0 - 52.0 %   MCV 97.8 78.0 - 100.0 fL   MCH 33.2 26.0 - 34.0 pg   MCHC 34.0 30.0 - 36.0 g/dL   RDW 16.4 (H) 11.5 - 15.5 %   Platelets 341 150 - 400 K/uL  Glucose, capillary     Status: Abnormal   Collection Time: 06/16/16  4:24 AM  Result Value Ref Range   Glucose-Capillary 134 (H) 65 - 99 mg/dL  Blood gas, arterial     Status: Abnormal   Collection Time: 06/16/16  4:54 AM  Result Value Ref Range   FIO2 0.30    O2 Content 30.0 L/min   Delivery systems VENTILATOR    Mode PRESSURE REGULATED VOLUME CONTROL    VT 550 mL   LHR 14.0 resp/min   Peep/cpap 5.0 cm H20   pH, Arterial 7.463 (H) 7.350 - 7.450   pCO2 arterial 38.7 32.0 - 48.0 mmHg   pO2, Arterial 110 (H) 83.0 - 108.0 mmHg   Bicarbonate 27.8 20.0 - 28.0 mmol/L   Acid-Base Excess 3.7 (H) 0.0 - 2.0 mmol/L   O2 Saturation 98.1 %   Collection site LEFT RADIAL    Drawn by [id number]    Sample type ARTERIAL DRAW    Allens test (pass/fail) PASS PASS  Glucose, capillary     Status: Abnormal   Collection Time: 06/16/16  7:54 AM  Result Value Ref Range   Glucose-Capillary 122 (H) 65 - 99 mg/dL   Comment 1 Notify RN   Glucose, capillary     Status: Abnormal   Collection Time: 06/16/16 11:11 AM  Result Value Ref Range   Glucose-Capillary 127 (H) 65 - 99 mg/dL   Comment 1 Notify RN   Glucose, capillary     Status: Abnormal   Collection  Time: 06/16/16  4:28 PM  Result Value Ref Range   Glucose-Capillary 118 (H) 65 - 99 mg/dL   Comment 1 Notify RN   Glucose, capillary     Status: Abnormal   Collection Time: 06/16/16  8:18 PM  Result Value Ref Range   Glucose-Capillary 106 (H) 65 - 99 mg/dL     Comment 1 Notify RN    Comment 2 Document in Chart   Glucose, capillary     Status: Abnormal   Collection Time: 06/17/16  2:08 AM  Result Value Ref Range   Glucose-Capillary 103 (H) 65 - 99 mg/dL  Blood gas, arterial     Status: Abnormal   Collection Time: 06/17/16  4:30 AM  Result Value Ref Range   FIO2 30.00    Delivery systems VENTILATOR    Mode PRESSURE REGULATED VOLUME CONTROL    VT 550 mL   LHR 14 resp/min   Peep/cpap 5.0 cm H20   pH, Arterial 7.390 7.350 - 7.450   pCO2 arterial 46.9 32.0 - 48.0 mmHg   pO2, Arterial 95.3 83.0 - 108.0 mmHg   Bicarbonate 26.8 20.0 - 28.0 mmol/L   Acid-Base Excess 3.1 (H) 0.0 - 2.0 mmol/L   O2 Saturation 96.7 %   Collection site RIGHT RADIAL    Drawn by [id number]    Sample type ARTERIAL    Allens test (pass/fail) PASS PASS  Glucose, capillary     Status: Abnormal   Collection Time: 06/17/16  7:25 AM  Result Value Ref Range   Glucose-Capillary 111 (H) 65 - 99 mg/dL  Basic metabolic panel     Status: Abnormal   Collection Time: 06/17/16  9:43 AM  Result Value Ref Range   Sodium 140 135 - 145 mmol/L   Potassium 3.8 3.5 - 5.1 mmol/L   Chloride 106 101 - 111 mmol/L   CO2 30 22 - 32 mmol/L   Glucose, Bld 116 (H) 65 - 99 mg/dL   BUN 26 (H) 6 - 20 mg/dL   Creatinine, Ser 0.62 0.61 - 1.24 mg/dL   Calcium 8.4 (L) 8.9 - 10.3 mg/dL   GFR calc non Af Amer >60 >60 mL/min   GFR calc Af Amer >60 >60 mL/min    Comment: (NOTE) The eGFR has been calculated using the CKD EPI equation. This calculation has not been validated in all clinical situations. eGFR's persistently <60 mL/min signify possible Chronic Kidney Disease.    Anion gap 4 (L) 5 - 15  Glucose, capillary     Status: Abnormal   Collection Time: 06/17/16 11:09 AM  Result Value Ref Range   Glucose-Capillary 106 (H) 65 - 99 mg/dL    Studies/Results:  CTA LUNG IMPRESSION: Manubrial fracture  No evidence of pulmonary emboli.  Left lower lobe infiltrate and  effusion.     BRAIN MRI FINDINGS: Brain: Cerebral volume is within normal limits for age. No restricted diffusion to suggest acute infarction. No midline shift, mass effect, evidence of mass lesion, ventriculomegaly, extra-axial collection or acute intracranial hemorrhage. Cervicomedullary junction and pituitary are within normal limits.  Pearline Cables and white matter signal is within normal limits for age throughout the brain. No cortical encephalomalacia or chronic cerebral blood products.  Vascular: Major intracranial vascular flow voids are preserved.  Skull and upper cervical spine: Negative. Normal bone marrow signal.  Sinuses/Orbits: Normal orbits soft tissues. Trace paranasal sinus mucosal thickening.  Other: Visible internal auditory structures appear normal. Mastoids are clear. Negative scalp soft tissues.  IMPRESSION: No acute intracranial  abnormality. Normal for age noncontrast MRI appearance of the brain.            L SPINE MRI 2015 FINDINGS: Trace anterolisthesis of L3 on L4 and L4 on L5 is unchanged. There is grade 1 retrolisthesis of L1 on L2, stable to minimally increased from prior MRI. New from the prior MRI is a mild compression deformity involving the L2 superior endplate with approximately 10% vertebral body height loss. There is abnormal fluid signal within the L1-2 disc space, and there is also new edema throughout the L1 and L2 vertebral bodies. There is paraspinal inflammatory change/ phlegmon at this level, and there is a 1.9 x 1.3 cm fluid collection in the left psoas muscle at the L2 level (series 5, image 16).  Advanced disc space height loss at L5-S1 is unchanged, however there is increased fluid signal within the disc space compared to the prior MRI with mildly increased marrow edema within the adjacent L5 and S1 vertebral bodies. The conus medullaris is normal in signal and terminates at the superior aspect of L2.  T11-12:  Only imaged sagittally. Facet hypertrophy results in mild bilateral neural foraminal narrowing, right greater than left and unchanged.  T12-L1:  Negative.  L1-2: Sequelae of prior laminectomy and posterior fusion are again identified. Posterior retropulsion of the superior L2 endplate, greater on the right, with possibly small adjacent epidural fluid collection, results in new narrowing of the right lateral recess and increased right neural foraminal narrowing. No spinal canal stenosis.  L2-3: Mild disc bulge and facet hypertrophy result in mild right lateral recess and neural foraminal narrowing, unchanged.  L3-4: Mild disc bulge and moderate facet hypertrophy result in mild right neural foraminal narrowing, unchanged. No spinal canal stenosis.  L4-5: Left foraminal disc protrusion and mild-to-moderate facet hypertrophy result in mild to moderate left neural foraminal stenosis, unchanged. No spinal canal stenosis.  L5-S1: Disc bulge and mild facet hypertrophy result in mild to moderate bilateral neural foraminal stenosis, unchanged. No spinal canal stenosis.  IMPRESSION: 1. Changes at L1-2 as above concerning for discitis/osteomyelitis with small left psoas abscess. New, mild compression deformity of the L2 superior endplate and mild retropulsion results in new right lateral recess narrowing and increased right neural foraminal narrowing at L1-2. Small ventral epidural fluid collection is not excluded on the right. 2. Advanced degenerative disc disease at L5-S1 with a small amount of new fluid signal in the disc space and mildly increased adjacent marrow changes. Early discitis/osteomyelitis is not excluded.       Charlee Squibb A. Marc Rose, M.D.  Diplomate, Tax adviser of Psychiatry and Neurology ( Neurology). 06/17/2016, 2:03 PM

## 2016-06-17 NOTE — Care Management Note (Signed)
Case Management Note  Patient Details  Name: MARSHEL ROLNICK MRN: [id number] Date of Birth: 1950-12-29   Expected Discharge Date:  06/07/16               Expected Discharge Plan:  [place]  In-House Referral:  Clinical Social Work  Discharge planning Services  CM Consult  Post Acute Care Choice:  NA Choice offered to:  NA  DME Arranged:    DME Agency:     HH Arranged:    [place] Agency:     Status of Service:  Completed, signed off  If discussed at [employer] of Stay Meetings, dates discussed:  06/18/2015  Additional Comments: CM [facility] to [facility]. Patient is sedated and CM will need family approval for LTAC. Patient's sister has arrived from [place] last night and is agreeable to [facility] and would like to talk with representative from [place] today. Rep, Fabienne Bruns will be here today between 1 and 1:30. RN updated and asked to relay message.  Shahzad Thomann, Chauncey Reading, RN 06/17/2016, 11:44 AM

## 2016-06-18 DIAGNOSIS — G629 Polyneuropathy, unspecified: Secondary | ICD-10-CM | POA: Diagnosis not present

## 2016-06-18 DIAGNOSIS — Z7189 Other specified counseling: Secondary | ICD-10-CM

## 2016-06-18 DIAGNOSIS — Z515 Encounter for palliative care: Secondary | ICD-10-CM

## 2016-06-18 LAB — BLOOD GAS, ARTERIAL
ACID-BASE EXCESS: 4.4 mmol/L — AB (ref 0.0–2.0)
Acid-Base Excess: 2.6 mmol/L — ABNORMAL HIGH (ref 0.0–2.0)
Allens test (pass/fail): POSITIVE — AB
BICARBONATE: 26.5 mmol/L (ref 20.0–28.0)
Bicarbonate: 28 mmol/L (ref 20.0–28.0)
Drawn by: 382351
Drawn by: 221791
FIO2: 35
FIO2: 40
MODE: POSITIVE
O2 SAT: 88.7 %
O2 Saturation: 94.4 %
PATIENT TEMPERATURE: 37
PCO2 ART: 43.4 mmHg (ref 32.0–48.0)
PEEP/CPAP: 5 cmH2O
PH ART: 7.433 (ref 7.350–7.450)
PIP: 5 cmH2O
PRESSURE SUPPORT: 5 cmH2O
Patient temperature: 37
RATE: 14 resp/min
VT: 550 mL
pCO2 arterial: 45.6 mmHg (ref 32.0–48.0)
pH, Arterial: 7.392 (ref 7.350–7.450)
pO2, Arterial: 58.2 mmHg — ABNORMAL LOW (ref 83.0–108.0)
pO2, Arterial: 76.4 mmHg — ABNORMAL LOW (ref 83.0–108.0)

## 2016-06-18 LAB — GLUCOSE, CAPILLARY
GLUCOSE-CAPILLARY: 115 mg/dL — AB (ref 65–99)
GLUCOSE-CAPILLARY: 121 mg/dL — AB (ref 65–99)
Glucose-Capillary: 103 mg/dL — ABNORMAL HIGH (ref 65–99)
Glucose-Capillary: 118 mg/dL — ABNORMAL HIGH (ref 65–99)
Glucose-Capillary: 131 mg/dL — ABNORMAL HIGH (ref 65–99)
Glucose-Capillary: 92 mg/dL (ref 65–99)

## 2016-06-18 LAB — BASIC METABOLIC PANEL
Anion gap: 6 (ref 5–15)
BUN: 21 mg/dL — ABNORMAL HIGH (ref 6–20)
CALCIUM: 8.5 mg/dL — AB (ref 8.9–10.3)
CHLORIDE: 104 mmol/L (ref 101–111)
CO2: 28 mmol/L (ref 22–32)
CREATININE: 0.58 mg/dL — AB (ref 0.61–1.24)
GFR calc Af Amer: >60 mL/min (ref >60)
GFR calc non Af Amer: >60 mL/min (ref >60)
GLUCOSE: 112 mg/dL — AB (ref 65–99)
Potassium: 3.6 mmol/L (ref 3.5–5.1)
Sodium: 138 mmol/L (ref 135–145)

## 2016-06-18 MED ORDER — VITAL AF 1.2 CAL PO LIQD: 1000.0000 mL | ORAL | Status: DC

## 2016-06-18 MED ORDER — FENTANYL CITRATE (PF) 100 MCG/2ML IJ SOLN
25.0000 ug | INTRAMUSCULAR | Status: DC | PRN
  Administered 2016-06-19 – 2016-06-20 (×5): 25 ug via INTRAVENOUS
  Filled 2016-06-18 (×5): qty 2

## 2016-06-18 NOTE — Evaluation (Signed)
Clinical/Bedside Swallow Evaluation Patient Details  Name: Marc Rose MRN: [id number] Date of Birth: 09-24-1950  Today's Date: 06/18/2016 Time: SLP Start Time (ACUTE ONLY): [id number] SLP Stop Time (ACUTE ONLY): 1733 SLP Time Calculation (min) (ACUTE ONLY): 31 min  Past Medical History:  Past Medical History:  Diagnosis Date  . Anxiety state, unspecified   . Arthritis   . 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 Benign neoplasm of colon   . Causalgia of upper limb    Left limb  . Chronic back pain   . Chronic leg pain   . Depression   . Diabetes mellitus   . Diskitis 02/05/2015  . Drug rash 11/12/2015  . Elevated blood pressure reading without diagnosis of hypertension   . Essential hypertension, benign   . Hardware complicating wound infection ([place]) 02/05/2015  . Hyperpigmentation 05/15/2015  . Hypertension   . Impaired fasting glucose   . Peripheral arterial disease ([place])   . Pure hypercholesterolemia   . Tobacco use disorder   . Unspecified retinal detachment    Past Surgical History:  Past Surgical History:  Procedure Laterality Date  . ANKLE SURGERY     left  . APPENDECTOMY    . BACK SURGERY    . COLONOSCOPY    . KNEE SURGERY     left   HPI:  66 year old man with multiple lumbar/spinal surgeries, history of lumbar discitis and osteomyelitis-on chronic oral doxycycline, urinary retention requiring self bladder catheterizations since 2014, chronic lower abdominal pain, and HTN, who presented to the ED on 06/03/16 for lower abdominal pain and generalized weakness. He has had multiple surgeries on his left lower extremity. Patient has been on chronic doxycycline for chronic discitis and osteomyelitis since 2015. Patient has a urine analysis positive for infection from January 26 with culture positive for Enterococcus faecalis. Dx: UTI, mild lactic acidosis without sepsis, 51mm R upper lung nodule, acute encephalopathy with mental status significantly declining on 06/09/2016 possibly due to UTI or  adverse effect of Levaquin. Hypovolemic shock. Acute respiratory failure and intubation for airway protection. 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 Approximately 1 week into the hospitalization he developed acute encephalopathy with decreased mentation, was transferred to the stepdown unit and started on Precedex. He quickly decompensated and was intubated 2/7. He subsequently developed hypotension requiring vasopressor support. SLP to administer BSE and determine LRD.   Assessment / Plan / Recommendation Clinical Impression  Pt was extubated after being intubated for 9 days; Pt was alert and very pleasant seen at bedside presenting with some congestion at baseline and deep mildly hoarse vocal quality. Oral mechanism revealed pt's edentulous status, left facial droop and decreased left labial ROM. Pt is unable to self feed secondary to ataxia, ophthalmoplegia and areflexia. Pt  consumed ice chips with mildly uncoordinated oral stage followed by an effortful swallow. Pt consistently looked up when swallowing and required cues to "look forward" or "look down" when attempting to swallow. Pt consumed thin liquids demonstrating immediate coughing X2 and consistent wet vocal quality. Pt consumed NTL with slightly delayed swallow trigger followed by an effortful swallow and noted uncoordination of the swallow but no coughing or wet vocal quality was noted after consuming >4 oz of trials via cup. Pt consumed puree textures with prolonged oral transit time and mild oral residue after the swallow. Pt requested to not attempt solid textures d/t not having his dentures here at the hospital. Recommend D1/puree and  NECTAR THICK liquids. Recommend meds to be crushed in puree. SLP will continue to follow while in acute setting.     Aspiration Risk  Mild aspiration risk    Diet Recommendation Dysphagia 1  (Puree);Nectar-thick liquid   Liquid Administration via: Cup Medication Administration: Crushed with puree Supervision: Full supervision/cueing for compensatory strategies Compensations: Minimize environmental distractions;Slow rate;Small sips/bites;Lingual sweep for clearance of pocketing;Multiple dry swallows after each bite/sip;Follow solids with liquid    Other  Recommendations Oral Care Recommendations: Oral care before and after PO Other Recommendations: Order thickener from pharmacy   Follow up Recommendations Skilled Nursing facility;24 hour supervision/assistance      Frequency and Duration min 2x/week  2 weeks       Prognosis Prognosis for Safe Diet Advancement: Fair      Swallow Study   General Date of Onset: 06/03/16 HPI: 66 year old man with multiple lumbar/spinal surgeries, history of lumbar discitis and osteomyelitis-on chronic oral doxycycline, urinary retention requiring self bladder catheterizations since 2014, chronic lower abdominal pain, and HTN, who presented to the ED on 06/03/16 for lower abdominal pain and generalized weakness. He has had multiple surgeries on his left lower extremity. Patient has been on chronic doxycycline for chronic discitis and osteomyelitis since 2015. Patient has a urine analysis positive for infection from January 26 with culture positive for Enterococcus faecalis. Dx: UTI, mild lactic acidosis without sepsis, 79mm R upper lung nodule, acute encephalopathy with mental status significantly declining on 06/09/2016 possibly due to UTI or adverse effect of Levaquin. Hypovolemic shock. Acute respiratory failure and intubation for airway protection. 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 Approximately 1 week into the hospitalization he developed acute encephalopathy with decreased mentation, was transferred to the stepdown unit and started  on Precedex. He quickly decompensated and was intubated 2/7. He subsequently developed hypotension requiring vasopressor support. SLP to administer BSE and determine LRD. Type of Study: Bedside Swallow Evaluation Previous Swallow Assessment: none Diet Prior to this Study: NPO Temperature Spikes Noted: No History of Recent Intubation: Yes Length of Intubations (days): 9 days Date extubated: 06/18/16 Behavior/Cognition: Alert;Cooperative;Pleasant mood Oral Cavity Assessment: Dry Oral Care Completed by SLP: Recent completion by staff Oral Cavity - Dentition: Edentulous Vision: Impaired for self-feeding Self-Feeding Abilities: Total assist Patient Positioning: Upright in bed Baseline Vocal Quality: Hoarse;Low vocal intensity Volitional Cough: Strong Volitional Swallow: Able to elicit    Oral/Motor/Sensory Function Overall Oral Motor/Sensory Function: Moderate impairment Facial ROM: Reduced left Facial Symmetry: Abnormal symmetry left Facial Strength: Reduced left Lingual ROM: Reduced left Lingual Symmetry: Abnormal symmetry left   Ice Chips Ice chips: Impaired Presentation: Spoon Oral Phase Impairments: Reduced lingual movement/coordination Oral Phase Functional Implications: Prolonged oral transit Pharyngeal Phase Impairments: Suspected delayed Swallow;Multiple swallows;Wet Vocal Quality   Thin Liquid Thin Liquid: Impaired Presentation: Cup;Spoon Oral Phase Impairments: Reduced labial seal;Reduced lingual movement/coordination;Impaired mastication Oral Phase Functional Implications: Prolonged oral transit Pharyngeal  Phase Impairments: Suspected delayed Swallow;Multiple swallows;Wet Vocal Quality    Nectar Thick Nectar Thick Liquid: Impaired Presentation: Cup Oral Phase Impairments: Reduced labial seal;Reduced lingual movement/coordination;Poor awareness of bolus Oral phase functional implications: Prolonged oral transit;Oral residue Pharyngeal Phase Impairments: Suspected  delayed Swallow;Multiple swallows;Wet Vocal Quality   Honey Thick     Puree Puree: Impaired Presentation: Spoon Oral Phase Impairments: Reduced lingual movement/coordination;Poor awareness of bolus Oral Phase Functional Implications: Prolonged oral transit;Oral residue Pharyngeal Phase Impairments: Suspected delayed Swallow   Solid   Carilyn Woolston H. Izora Ribas  MA, CCC-SLP Speech Language Pathologist    Solid: Not tested        Wende Bushy 06/18/2016,6:08 PM

## 2016-06-18 NOTE — Progress Notes (Signed)
Subjective: He remains intubated and on the ventilator. Yesterday his respiratory mechanics were good but he Is sleeping during his weaning trial and would stop breathing when he went to sleep.  Objective: Vital signs in last 24 hours: Temp:  [97.2 F (36.2 C)-99.2 F (37.3 C)] 99.2 F (37.3 C) (02/16 0009) Pulse Rate:  [77-155] 104 (02/16 0200) Resp:  [11-25] 14 (02/16 0500) BP: (74-164)/(44-105) 143/93 (02/16 0500) SpO2:  [84 %-100 %] 96 % (02/16 0750) FiO2 (%):  [30 %-45 %] 40 % (02/16 0800) Weight change:  Last BM Date: 06/17/16  Intake/Output from previous day: 02/15 0701 - 02/16 0700 In: 1860.9 [I.V.:699.3; NG/GT:661.6; IV Piggyback:500] Out: 2000 [Urine:1200; Emesis/NG output:800]  PHYSICAL EXAM General appearance: Intubated sedated on mechanical ventilator Resp: clear to auscultation bilaterally Cardio: regular rate and rhythm, S1, S2 normal, no murmur, click, rub or gallop GI: soft, non-tender; bowel sounds normal; no masses,  no organomegaly Extremities: He has multiple cuts and abrasions on his legs from trauma prior to admission Skin warm and dry. Mucous membranes are moist  Lab Results:  Results for orders placed or performed during the hospital encounter of 06/03/16 (from the past 48 hour(s))  Glucose, capillary     Status: Abnormal   Collection Time: 06/16/16 11:11 AM  Result Value Ref Range   Glucose-Capillary 127 (H) 65 - 99 mg/dL   Comment 1 Notify RN   Glucose, capillary     Status: Abnormal   Collection Time: 06/16/16  4:28 PM  Result Value Ref Range   Glucose-Capillary 118 (H) 65 - 99 mg/dL   Comment 1 Notify RN   Glucose, capillary     Status: Abnormal   Collection Time: 06/16/16  8:18 PM  Result Value Ref Range   Glucose-Capillary 106 (H) 65 - 99 mg/dL   Comment 1 Notify RN    Comment 2 Document in Chart   Glucose, capillary     Status: Abnormal   Collection Time: 06/17/16  2:08 AM  Result Value Ref Range   Glucose-Capillary 103 (H) 65 - 99  mg/dL  Blood gas, arterial     Status: Abnormal   Collection Time: 06/17/16  4:30 AM  Result Value Ref Range   FIO2 30.00    Delivery systems VENTILATOR    Mode PRESSURE REGULATED VOLUME CONTROL    VT 550 mL   LHR 14 resp/min   Peep/cpap 5.0 cm H20   pH, Arterial 7.390 7.350 - 7.450   pCO2 arterial 46.9 32.0 - 48.0 mmHg   pO2, Arterial 95.3 83.0 - 108.0 mmHg   Bicarbonate 26.8 20.0 - 28.0 mmol/L   Acid-Base Excess 3.1 (H) 0.0 - 2.0 mmol/L   O2 Saturation 96.7 %   Collection site RIGHT RADIAL    Drawn by [id number]    Sample type ARTERIAL    Allens test (pass/fail) PASS PASS  Glucose, capillary     Status: Abnormal   Collection Time: 06/17/16  7:25 AM  Result Value Ref Range   Glucose-Capillary 111 (H) 65 - 99 mg/dL  Basic metabolic panel     Status: Abnormal   Collection Time: 06/17/16  9:43 AM  Result Value Ref Range   Sodium 140 135 - 145 mmol/L   Potassium 3.8 3.5 - 5.1 mmol/L   Chloride 106 101 - 111 mmol/L   CO2 30 22 - 32 mmol/L   Glucose, Bld 116 (H) 65 - 99 mg/dL   BUN 26 (H) 6 - 20 mg/dL   Creatinine, Ser 0.62  0.61 - 1.24 mg/dL   Calcium 8.4 (L) 8.9 - 10.3 mg/dL   GFR calc non Af Amer >60 >60 mL/min   GFR calc Af Amer >60 >60 mL/min    Comment: (NOTE) The eGFR has been calculated using the CKD EPI equation. This calculation has not been validated in all clinical situations. eGFR's persistently <60 mL/min signify possible Chronic Kidney Disease.    Anion gap 4 (L) 5 - 15  Glucose, capillary     Status: Abnormal   Collection Time: 06/17/16 11:09 AM  Result Value Ref Range   Glucose-Capillary 106 (H) 65 - 99 mg/dL  Glucose, capillary     Status: Abnormal   Collection Time: 06/17/16  4:00 PM  Result Value Ref Range   Glucose-Capillary 113 (H) 65 - 99 mg/dL  Glucose, capillary     Status: Abnormal   Collection Time: 06/17/16  9:43 PM  Result Value Ref Range   Glucose-Capillary 105 (H) 65 - 99 mg/dL   Comment 1 Notify RN    Comment 2 Document in Chart    Glucose, capillary     Status: Abnormal   Collection Time: 06/18/16 12:05 AM  Result Value Ref Range   Glucose-Capillary 103 (H) 65 - 99 mg/dL   Comment 1 Notify RN    Comment 2 Document in Chart   Glucose, capillary     Status: None   Collection Time: 06/18/16  4:12 AM  Result Value Ref Range   Glucose-Capillary 92 65 - 99 mg/dL  Basic metabolic panel     Status: Abnormal   Collection Time: 06/18/16  5:15 AM  Result Value Ref Range   Sodium 138 135 - 145 mmol/L   Potassium 3.6 3.5 - 5.1 mmol/L   Chloride 104 101 - 111 mmol/L   CO2 28 22 - 32 mmol/L   Glucose, Bld 112 (H) 65 - 99 mg/dL   BUN 21 (H) 6 - 20 mg/dL   Creatinine, Ser 0.58 (L) 0.61 - 1.24 mg/dL   Calcium 8.5 (L) 8.9 - 10.3 mg/dL   GFR calc non Af Amer >60 >60 mL/min   GFR calc Af Amer >60 >60 mL/min    Comment: (NOTE) The eGFR has been calculated using the CKD EPI equation. This calculation has not been validated in all clinical situations. eGFR's persistently <60 mL/min signify possible Chronic Kidney Disease.    Anion gap 6 5 - 15  Blood gas, arterial     Status: Abnormal   Collection Time: 06/18/16  5:24 AM  Result Value Ref Range   FIO2 35.00    Delivery systems VENTILATOR    Mode PRESSURE REGULATED VOLUME CONTROL    VT 550 mL   LHR 14 resp/min   Peep/cpap 5.0 cm H20   pH, Arterial 7.433 7.350 - 7.450   pCO2 arterial 43.4 32.0 - 48.0 mmHg   pO2, Arterial 58.2 (L) 83.0 - 108.0 mmHg   Bicarbonate 28.0 20.0 - 28.0 mmol/L   Acid-Base Excess 4.4 (H) 0.0 - 2.0 mmol/L   O2 Saturation 88.7 %   Patient temperature 37.0    Collection site LEFT RADIAL    Drawn by [id number]    Sample type ARTERIAL DRAW    Allens test (pass/fail) POSITIVE (A) PASS  Glucose, capillary     Status: Abnormal   Collection Time: 06/18/16  8:19 AM  Result Value Ref Range   Glucose-Capillary 115 (H) 65 - 99 mg/dL    ABGS  Recent Labs  06/18/16 0524  PHART  7.433  PO2ART 58.2*  HCO3 28.0   CULTURES Recent Results (from the  past 240 hour(s))  MRSA PCR Screening     Status: None   Collection Time: 06/09/16  5:00 PM  Result Value Ref Range Status   MRSA by PCR NEGATIVE NEGATIVE Final    Comment:        The GeneXpert MRSA Assay (FDA approved for NASAL specimens only), is one component of a comprehensive MRSA colonization surveillance program. It is not intended to diagnose MRSA infection nor to guide or monitor treatment for MRSA infections.   Culture, blood (routine x 2)     Status: None   Collection Time: 06/09/16  7:15 PM  Result Value Ref Range Status   Specimen Description BLOOD RIGHT HAND  Final   Special Requests BOTTLES DRAWN AEROBIC AND ANAEROBIC 6CC  Final   Culture NO GROWTH 6 DAYS  Final   Report Status 06/15/2016 FINAL  Final  Culture, blood (routine x 2)     Status: None   Collection Time: 06/09/16  7:20 PM  Result Value Ref Range Status   Specimen Description BLOOD LEFT HAND  Final   Special Requests BOTTLES DRAWN AEROBIC AND ANAEROBIC 6CC  Final   Culture NO GROWTH 6 DAYS  Final   Report Status 06/15/2016 FINAL  Final  CSF culture     Status: None   Collection Time: 06/10/16  8:00 AM  Result Value Ref Range Status   Specimen Description CSF  Final   Special Requests Normal  Final   Gram Stain   Final    NO ORGANISMS SEEN NO WBC SEEN CYTOSPIN SMEAR Gram Stain Report Called to,Read Back By and Verified With: WILLIE E. AT [id number] ON [id number] BY THOMPSON S.    Culture   Final    NO GROWTH 3 DAYS Performed at [facility], [place] [address] [place], [place] [address]    Report Status 06/13/2016 FINAL  Final   Studies/Results: Dg Chest 1 View  Result Date: 06/16/2016 CLINICAL DATA:  Status post re-intubation EXAM: CHEST 1 VIEW COMPARISON:  06/16/2016 FINDINGS: Endotracheal tube is again noted approximately 7.2 cm above the carina. The right-sided PICC line and nasogastric catheter are again seen stable. Postsurgical changes in the thoracolumbar spine are seen. The lungs are well  aerated bilaterally. No acute bony abnormality is seen. IMPRESSION: No acute abnormality noted. Electronically Signed   By: Inez Catalina M.D.   On: 06/16/2016 09:34    Medications:  Prior to Admission:  Prescriptions Prior to Admission  Medication Sig Dispense Refill Last Dose  . aspirin EC 81 MG tablet Take 81 mg by mouth daily.   Past Week at Unknown time  . benazepril (LOTENSIN) 40 MG tablet Take 40 mg by mouth daily.   Past Week at Unknown time  . cyclobenzaprine (FLEXERIL) 10 MG tablet Take 1 tablet (10 mg total) by mouth 3 (three) times daily as needed for muscle spasms. 30 tablet 0 Past Week at Unknown time  . diazepam (VALIUM) 10 MG tablet Take 0.5 tablets (5 mg total) by mouth every 6 (six) hours as needed for anxiety (back spasms). (Patient taking differently: Take 10 mg by mouth 4 (four) times daily as needed for anxiety (back spasms). ) 30 tablet 0 Past Week at Unknown time  . doxycycline (VIBRA-TABS) 100 MG tablet Take 1 tablet (100 mg total) by mouth 2 (two) times daily. 60 tablet 11 Past Week at Unknown time  . fluticasone (FLONASE) 50  MCG/ACT nasal spray Place 1 spray into the nose daily as needed for allergies.    unknown  . NEOMYCIN-POLYMYXIN-HYDROCORTISONE (CORTISPORIN) 1 % SOLN otic solution Place 2 drops in ear(s) daily. Reported on 11/12/2015   Past Week at Unknown time  . pregabalin (LYRICA) 75 MG capsule Take 75 mg by mouth 2 (two) times daily.    Past Week at Unknown time  . tamsulosin (FLOMAX) 0.4 MG CAPS capsule Take 0.4 mg by mouth.   Past Week at Unknown time   Scheduled: . chlorhexidine gluconate (MEDLINE KIT)  15 mL Mouth Rinse BID  . Chlorhexidine Gluconate Cloth  6 each Topical Daily  . doxycycline (VIBRAMYCIN) IV  100 mg Intravenous Q12H  . enoxaparin (LOVENOX) injection  40 mg Subcutaneous Q24H  . famotidine  20 mg Oral Daily  . haloperidol  5 mg Oral TID   Or  . haloperidol lactate  5 mg Intramuscular TID  . mouth rinse  15 mL Mouth Rinse QID  .  pantoprazole (PROTONIX) IV  40 mg Intravenous Daily  . polyvinyl alcohol  1 drop Both Eyes TID  . QUEtiapine  50 mg Oral QHS  . senna-docusate  1 tablet Oral QHS  . sodium chloride flush  10-40 mL Intracatheter Q12H  . thiamine injection  100 mg Intravenous Daily   Continuous: . feeding supplement (VITAL AF 1.2 CAL) 1,000 mL (06/18/16 0537)  . fentaNYL infusion INTRAVENOUS 50 mcg/hr (06/18/16 0500)  . midazolam (VERSED) infusion 1 mg/hr (06/18/16 0539)  . phenylephrine (NEO-SYNEPHRINE) Adult infusion 50 mcg/min (06/18/16 0500)   BZJ:IRCVE/LFYBOFBP arterial line **AND** sodium chloride, acetaminophen **OR** acetaminophen, bisacodyl, fentaNYL, haloperidol lactate, magnesium hydroxide, midazolam, ondansetron **OR** ondansetron (ZOFRAN) IV, sodium chloride flush  Assesment: She has acute respiratory failure from encephalopathy and [name] variant Guillain-Barr syndrome. He is doing okay as far as respiratory mechanics were concerned yesterday but I think he had not totally cleared his sedation. We'll try again today. He had been agitated previously but had less agitation yesterday during his weaning attempt. 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

## 2016-06-18 NOTE — Progress Notes (Signed)
Daily Progress Note   Patient Name: Marc Rose       Date: 06/18/2016 DOB: [birth year]  Age: 66 y.o. MRN#: [id number] Attending Physician: Samuella Cota, MD Primary Care Physician: [name] Patient Admit Date: 06/03/2016  Reason for Consultation/Follow-up: Establishing goals of care and Psychosocial/spiritual support  Subjective: Marc Rose is resting quietly in bed. He has been extubated within the last hour. He is able to make eye contact when I ask and gives me two thumbs up. Present today at bedside is sister Marc Rose, and her husband. We talk about Marc Rose extubation, and that this changes disposition plan in a positive way. We talk about disposition to a local skilled nursing facility instead of long-term acute care. Family states that Marc Rose lives near [place], they are requesting [facility] or [facility] [place].  Sister Marc Rose talks about future choices such as code status and healthcare power of attorney. She understands that Marc Rose is not in any position, at this point, to make those types of choices. I encourage them to hold these discussions with the assistance of the social worker at his rehab facility. I share that the social worker will help them with many questions including equipment needed for returning to home. Family states they are returning to [place] tomorrow, but will return in a few weeks. Sister Marc Rose states that they have a brother and sister, but they were unwilling/unable to help her to participate in Marc Rose care/decison making. I share that the law states, "reasonably available adult siblings". She seems relieved by this.  Length of Stay: 15  Current Medications: Scheduled Meds:  . chlorhexidine gluconate (MEDLINE KIT)  15 mL  Mouth Rinse BID  . Chlorhexidine Gluconate Cloth  6 each Topical Daily  . doxycycline (VIBRAMYCIN) IV  100 mg Intravenous Q12H  . enoxaparin (LOVENOX) injection  40 mg Subcutaneous Q24H  . famotidine  20 mg Oral Daily  . mouth rinse  15 mL Mouth Rinse QID  . pantoprazole (PROTONIX) IV  40 mg Intravenous Daily  . polyvinyl alcohol  1 drop Both Eyes TID  . QUEtiapine  50 mg Oral QHS  . senna-docusate  1 tablet Oral QHS  . sodium chloride flush  10-40 mL Intracatheter Q12H  . thiamine injection  100 mg Intravenous Daily  Continuous Infusions: . phenylephrine (NEO-SYNEPHRINE) Adult infusion 40 mcg/min (06/18/16 1500)    PRN Meds: acetaminophen **OR** acetaminophen, bisacodyl, fentaNYL (SUBLIMAZE) injection, haloperidol lactate, magnesium hydroxide, ondansetron **OR** ondansetron (ZOFRAN) IV, sodium chloride flush  Physical Exam  Constitutional: No distress.  Extubated within the last hour, makes eye contact, when requested, smiles  HENT:  Head: Normocephalic and atraumatic.  Cardiovascular: Normal rate and regular rhythm.   Pulmonary/Chest:  Extubated with in the last hour, no work of breathing at this time, productive cough.   Abdominal: Soft. He exhibits no distension.  Musculoskeletal: He exhibits no edema.  Neurological: He is alert.  Makes eye contact  Skin: Skin is warm and dry.  Vitals reviewed.           Vital Signs: BP 113/72   Pulse 99   Temp 98.8 F (37.1 C) (Oral)   Resp (!) 22   Ht _0  (1.88 m)   Wt 74.9 kg (165 lb 2 oz)   SpO2 94%   BMI 21.20 kg/m  SpO2: SpO2: 94 % O2 Device: O2 Device: Nasal Cannula O2 Flow Rate: O2 Flow Rate (L/min): 4 L/min  Intake/output summary:  Intake/Output Summary (Last 24 hours) at 06/18/16 1605 Last data filed at 06/18/16 1500  Gross per 24 hour  Intake          1924.21 ml  Output             1880 ml  Net            44.21 ml   LBM: Last BM Date: 06/17/16 Baseline Weight: Weight: 74.8 kg (165 lb) Most recent weight:  Weight: 74.9 kg (165 lb 2 oz)       Palliative Assessment/Data:    Flowsheet Rows   Flowsheet Row Most Recent Value  Intake Tab  Referral Department  Hospitalist  Unit at Time of Referral  ICU  Palliative Care Primary Diagnosis  Neurology  Date Notified  06/17/16  Palliative Care Type  New Palliative care  Reason for referral  Clarify Goals of Care  Date of Admission  06/03/16  Date first seen by Palliative Care  06/17/16  # of days Palliative referral response time  0 Day(s)  # of days IP prior to Palliative referral  14  Clinical Assessment  Palliative Performance Scale Score  20%  Pain Max last 24 hours  Not able to report  Pain Min Last 24 hours  Not able to report  Dyspnea Max Last 24 Hours  Not able to report  Dyspnea Min Last 24 hours  Not able to report  Psychosocial & Spiritual Assessment  Palliative Care Outcomes  Patient/Family meeting held?  Yes  Who was at the meeting?  With sister, Marc Rose, at bedside  [place]  Provided psychosocial or spiritual support  Palliative Care follow-up planned  -- [Follow-up while at [place]      Patient Active Problem List   Diagnosis Date Noted  . Palliative care encounter   . Goals of care, counseling/discussion   . Acute encephalopathy 06/16/2016  . 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 Acute respiratory failure ([place]) 06/10/2016  . Malnutrition of moderate degree 06/10/2016  . Ataxic gait   . Muscle weakness (generalized)   . 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 Drug rash 11/12/2015  . Hyperpigmentation 05/15/2015  . Abdominal pain, right lower quadrant 02/05/2015  . Abscess, psoas ([place]) 02/05/2015  . Acute upper respiratory infection 02/05/2015  .  Long term current use of antibiotics 02/05/2015  . Benign essential HTN 02/05/2015  . Edema leg 02/05/2015  . Complex regional pain syndrome type 2 of upper extremity 02/05/2015  . Back pain,  chronic 02/05/2015  . CN (constipation) 02/05/2015  . CD (contact dermatitis) 02/05/2015  . Elevated fasting blood sugar 02/05/2015  . LBP (low back pain) 02/05/2015  . Discitis of lumbar region 02/05/2015  . Disorder of nutrition 02/05/2015  . Feeling bilious 02/05/2015  . Flu vaccine need 02/05/2015  . Peripheral neuropathic pain 02/05/2015  . Non compliance with medical treatment 02/05/2015  . Abnormal blood chemistry 02/05/2015  . Angiopathy, peripheral ([place]) 02/05/2015  . Nerve disease, peripheral ([place]) 02/05/2015  . Hypercholesterolemia without hypertriglyceridemia 02/05/2015  . Spinal stenosis 02/05/2015  . Buzzing in ear 02/05/2015  . Compulsive tobacco user syndrome 02/05/2015  . Bladder retention 02/05/2015  . Abnormal weight loss 02/05/2015  . Diskitis 02/05/2015  . Hardware complicating wound infection ([place]) 02/05/2015  . Hyperlipidemia 07/05/2014  . Peripheral arterial disease ([place]) 07/05/2014  . Hypotension 09/16/2013  . HTN (hypertension) 09/16/2013  . Anxiety 09/16/2013  . Back pain 09/16/2013    Palliative Care Assessment & Plan   Patient Profile: 66 y.o. male  with past medical history of arthritis, benign neoplasm of colon, chronic back and leg pain, diabetes, depression, hypertension, PAD, tobacco use disorder admitted on 06/03/2016 with UTI, Gilliam beret syndrome?.   Recommendations/Plan:  Family is requesting rehab in SNF [facility] or [facility].   Goals of Care and Additional Recommendations:  Limitations on Scope of Treatment: Continue to treat the treatable at this point, code status discussions at a later date when patient is more stable.  Code Status:    Code Status Orders        Start     Ordered   06/03/16 1518  Full code  Continuous     06/03/16 1517    Code Status History    Date Active Date Inactive Code Status Order ID Comments User Context   09/16/2013 10:03 PM 09/19/2013  4:23 PM Full Code [id number]  Doree Albee, MD  ED    Advance Directive Documentation   [place] Most Recent Value  Type of Advance Directive  Living will  Pre-existing out of facility DNR order (yellow form or pink MOST form)  No data  "MOST" Form in Place?  No data       Prognosis:   Unable to determine, based on outcomes  Discharge Planning:  SNF for rehab, family is requesting [facility], or [facility]. It seems that Mr. Schicker no longer needs long-term acute care at this point.  Care plan was discussed with nursing staff, case manager, social worker, and Dr. Sarajane Jews  Thank you for allowing the Palliative Medicine Team to assist in the care of this patient.   Time In: 1430  Time Out: 1505  Total Time 35 minutes  Prolonged Time Billed  no       Greater than 50%  of this time was spent counseling and coordinating care related to the above assessment and plan.  Drue Novel, NP  Please contact Palliative Medicine Team phone at [phone] for questions and concerns.

## 2016-06-18 NOTE — Progress Notes (Signed)
[place] A. [name], MD     [email or web address]          Marc Rose is an 66 y.o. male.   ASSESSMENT/PLAN:      Severe delirium/encephalopathy of unclear etiology at this time. We are suspecting that this may be multifactorial including UTI and medication effect from Levaquin. Levaquin has been discontinued. The picture seems most consistent with toxic metabolic encephalopathy/delirium as opposed to primary and CNS problem. The patient's spinal fluid analysis shows the typical formula with elevated protein and essentially unremarkable white cell count (cytoalbuminologic dissociation) seen in demyelinating conditions such as Guillain-Barr syndrome. The CSF does not show evidence of central nervous system infection. Attempted wean off the ventilator this weekend with associated with the patient becoming quite restless. I recommend that agitation be treated with when necessary doses of Haldol. I will increase his maintenance dose of Haldol. Will continue with the Seroquel.    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 Completed 5 day course of IV IG    Low back pain status post L1-L2 laminectomy. HX of chronic discitis      Attempted wean this weekend. He is still on sedation but less today. There are episodes of hypotension yesterday.         Overall things are going about the same for the patient. He does get somewhat agitated at times. He seemed to be somewhat more responsive today.      Marland Kitchen  GENERAL: He is intubated.   HEENT: Supple. Atraumatic normocephalic. Dense cataracts both sides.  ABDOMEN: soft  EXTREMITIES: No edema. Multiple abrasions and lacerations involving the knees and legs.   BACK: Normal.  SKIN: Normal by inspection.    MENTAL STATUS: He opens his eyes to verbal commands. He does gestures yes and no shaking his  head affirmatively. He does follow midline and appendicular commands intermittently.  CRANIAL NERVES: Pupils are mid position and reactive. He seems to have full extraocular movements. Facial muscles are symmetric.   MOTOR: Moves both sides well.  COORDINATION: No tremor or dysmetria  REFLEXES: The patient is areflexic in the legs and upper extremities.    EEG normal  Blood pressure (!) 143/93, pulse (!) 104, temperature 99.2 F (37.3 C), temperature source Axillary, resp. rate 14, height '6\' 2"'  (1.88 m), weight 165 lb 2 oz (74.9 kg), SpO2 94 %.  Past Medical History:  Diagnosis Date  . Anxiety state, unspecified   . Arthritis   . 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 Benign neoplasm of colon   . Causalgia of upper limb    Left limb  . Chronic back pain   . Chronic leg pain   . Depression   . Diabetes mellitus   . Diskitis 02/05/2015  . Drug rash 11/12/2015  . Elevated blood pressure reading without diagnosis of hypertension   . Essential hypertension, benign   . Hardware complicating wound infection ([place]) 02/05/2015  . Hyperpigmentation 05/15/2015  . Hypertension   . Impaired fasting glucose   . Peripheral arterial disease ([place])   . Pure hypercholesterolemia   . Tobacco use disorder   . Unspecified retinal detachment     Past Surgical History:  Procedure Laterality Date  . ANKLE SURGERY     left  . APPENDECTOMY    . BACK SURGERY    . COLONOSCOPY    . KNEE SURGERY     left    Family History  Problem Relation Age of Onset  . COPD Mother   . Hyperthyroidism Mother   . Hyperthyroidism Sister     Social History:  reports that he has been smoking Cigarettes.  He has a 19.50 pack-year smoking history. He has never used smokeless tobacco. He reports that he uses drugs, including Marijuana. He reports that he does not drink alcohol.  Allergies:  Allergies  Allergen Reactions  . Codeine Anaphylaxis, Hives and Swelling  . Penicillins Anaphylaxis, Hives and Swelling  . Latex Itching   . Strawberry Extract Hives    Medications: Prior to Admission medications   Medication Sig Start Date End Date Taking? Authorizing Provider  aspirin EC 81 MG tablet Take 81 mg by mouth daily.   Yes Historical Provider, MD  benazepril (LOTENSIN) 40 MG tablet Take 40 mg by mouth daily.   Yes Historical Provider, MD  cyclobenzaprine (FLEXERIL) 10 MG tablet Take 1 tablet (10 mg total) by mouth 3 (three) times daily as needed for muscle spasms. 09/19/13  Yes Adeline Saralyn Pilar, MD  diazepam (VALIUM) 10 MG tablet Take 0.5 tablets (5 mg total) by mouth every 6 (six) hours as needed for anxiety (back spasms). Patient taking differently: Take 10 mg by mouth 4 (four) times daily as needed for anxiety (back spasms).  09/19/13  Yes Adeline Saralyn Pilar, MD  doxycycline (VIBRA-TABS) 100 MG tablet Take 1 tablet (100 mg total) by mouth 2 (two) times daily. 11/27/15  Yes Truman Hayward, MD  fluticasone [facility]) 50 MCG/ACT nasal spray Place 1 spray into the nose daily as needed for allergies.  07/04/14  Yes Historical Provider, MD  NEOMYCIN-POLYMYXIN-HYDROCORTISONE (CORTISPORIN) 1 % SOLN otic solution Place 2 drops in ear(s) daily. Reported on 11/12/2015 07/04/14  Yes Historical Provider, MD  pregabalin (LYRICA) 75 MG capsule Take 75 mg by mouth 2 (two) times daily.  05/07/13  Yes Historical Provider, MD  tamsulosin (FLOMAX) 0.4 MG CAPS capsule Take 0.4 mg by mouth.   Yes Historical Provider, MD    Scheduled Meds: . chlorhexidine gluconate (MEDLINE KIT)  15 mL Mouth Rinse BID  . Chlorhexidine Gluconate Cloth  6 each Topical Daily  . doxycycline (VIBRAMYCIN) IV  100 mg Intravenous Q12H  . enoxaparin (LOVENOX) injection  40 mg Subcutaneous Q24H  . famotidine  20 mg Oral Daily  . haloperidol  5 mg Oral TID   Or  . haloperidol lactate  5 mg Intramuscular TID  . mouth rinse  15 mL Mouth Rinse QID  . pantoprazole (PROTONIX) IV  40 mg Intravenous Daily  . polyvinyl alcohol  1 drop Both Eyes TID  . QUEtiapine  50 mg  Oral QHS  . senna-docusate  1 tablet Oral QHS  . sodium chloride flush  10-40 mL Intracatheter Q12H  . thiamine injection  100 mg Intravenous Daily   Continuous Infusions: . feeding supplement (VITAL AF 1.2 CAL) 1,000 mL (06/18/16 0537)  . fentaNYL infusion INTRAVENOUS 50 mcg/hr (06/18/16 0500)  . midazolam (VERSED) infusion 1 mg/hr (06/18/16 0539)  . phenylephrine (NEO-SYNEPHRINE) Adult infusion 50 mcg/min (06/18/16 0500)   PRN Meds:.Place/Maintain arterial line **AND** sodium chloride, acetaminophen **OR** acetaminophen, bisacodyl, fentaNYL, haloperidol lactate, magnesium hydroxide, midazolam, ondansetron **OR** ondansetron (ZOFRAN) IV, sodium chloride flush     Results for orders placed or performed during the hospital encounter of 06/03/16 (from the past 48 hour(s))  Glucose, capillary     Status: Abnormal   Collection Time: 06/16/16  7:54 AM  Result Value Ref Range   Glucose-Capillary 122 (  H) 65 - 99 mg/dL   Comment 1 Notify RN   Glucose, capillary     Status: Abnormal   Collection Time: 06/16/16 11:11 AM  Result Value Ref Range   Glucose-Capillary 127 (H) 65 - 99 mg/dL   Comment 1 Notify RN   Glucose, capillary     Status: Abnormal   Collection Time: 06/16/16  4:28 PM  Result Value Ref Range   Glucose-Capillary 118 (H) 65 - 99 mg/dL   Comment 1 Notify RN   Glucose, capillary     Status: Abnormal   Collection Time: 06/16/16  8:18 PM  Result Value Ref Range   Glucose-Capillary 106 (H) 65 - 99 mg/dL   Comment 1 Notify RN    Comment 2 Document in Chart   Glucose, capillary     Status: Abnormal   Collection Time: 06/17/16  2:08 AM  Result Value Ref Range   Glucose-Capillary 103 (H) 65 - 99 mg/dL  Blood gas, arterial     Status: Abnormal   Collection Time: 06/17/16  4:30 AM  Result Value Ref Range   FIO2 30.00    Delivery systems VENTILATOR    Mode PRESSURE REGULATED VOLUME CONTROL    VT 550 mL   LHR 14 resp/min   Peep/cpap 5.0 cm H20   pH, Arterial 7.390 7.350 -  7.450   pCO2 arterial 46.9 32.0 - 48.0 mmHg   pO2, Arterial 95.3 83.0 - 108.0 mmHg   Bicarbonate 26.8 20.0 - 28.0 mmol/L   Acid-Base Excess 3.1 (H) 0.0 - 2.0 mmol/L   O2 Saturation 96.7 %   Collection site RIGHT RADIAL    Drawn by [id number]    Sample type ARTERIAL    Allens test (pass/fail) PASS PASS  Glucose, capillary     Status: Abnormal   Collection Time: 06/17/16  7:25 AM  Result Value Ref Range   Glucose-Capillary 111 (H) 65 - 99 mg/dL  Basic metabolic panel     Status: Abnormal   Collection Time: 06/17/16  9:43 AM  Result Value Ref Range   Sodium 140 135 - 145 mmol/L   Potassium 3.8 3.5 - 5.1 mmol/L   Chloride 106 101 - 111 mmol/L   CO2 30 22 - 32 mmol/L   Glucose, Bld 116 (H) 65 - 99 mg/dL   BUN 26 (H) 6 - 20 mg/dL   Creatinine, Ser 0.62 0.61 - 1.24 mg/dL   Calcium 8.4 (L) 8.9 - 10.3 mg/dL   GFR calc non Af Amer >60 >60 mL/min   GFR calc Af Amer >60 >60 mL/min    Comment: (NOTE) The eGFR has been calculated using the CKD EPI equation. This calculation has not been validated in all clinical situations. eGFR's persistently <60 mL/min signify possible Chronic Kidney Disease.    Anion gap 4 (L) 5 - 15  Glucose, capillary     Status: Abnormal   Collection Time: 06/17/16 11:09 AM  Result Value Ref Range   Glucose-Capillary 106 (H) 65 - 99 mg/dL  Glucose, capillary     Status: Abnormal   Collection Time: 06/17/16  4:00 PM  Result Value Ref Range   Glucose-Capillary 113 (H) 65 - 99 mg/dL  Glucose, capillary     Status: Abnormal   Collection Time: 06/17/16  9:43 PM  Result Value Ref Range   Glucose-Capillary 105 (H) 65 - 99 mg/dL   Comment 1 Notify RN    Comment 2 Document in Chart   Glucose, capillary     Status:  Abnormal   Collection Time: 06/18/16 12:05 AM  Result Value Ref Range   Glucose-Capillary 103 (H) 65 - 99 mg/dL   Comment 1 Notify RN    Comment 2 Document in Chart   Glucose, capillary     Status: None   Collection Time: 06/18/16  4:12 AM  Result Value  Ref Range   Glucose-Capillary 92 65 - 99 mg/dL  Basic metabolic panel     Status: Abnormal   Collection Time: 06/18/16  5:15 AM  Result Value Ref Range   Sodium 138 135 - 145 mmol/L   Potassium 3.6 3.5 - 5.1 mmol/L   Chloride 104 101 - 111 mmol/L   CO2 28 22 - 32 mmol/L   Glucose, Bld 112 (H) 65 - 99 mg/dL   BUN 21 (H) 6 - 20 mg/dL   Creatinine, Ser 0.58 (L) 0.61 - 1.24 mg/dL   Calcium 8.5 (L) 8.9 - 10.3 mg/dL   GFR calc non Af Amer >60 >60 mL/min   GFR calc Af Amer >60 >60 mL/min    Comment: (NOTE) The eGFR has been calculated using the CKD EPI equation. This calculation has not been validated in all clinical situations. eGFR's persistently <60 mL/min signify possible Chronic Kidney Disease.    Anion gap 6 5 - 15  Blood gas, arterial     Status: Abnormal   Collection Time: 06/18/16  5:24 AM  Result Value Ref Range   FIO2 35.00    Delivery systems VENTILATOR    Mode PRESSURE REGULATED VOLUME CONTROL    VT 550 mL   LHR 14 resp/min   Peep/cpap 5.0 cm H20   pH, Arterial 7.433 7.350 - 7.450   pCO2 arterial 43.4 32.0 - 48.0 mmHg   pO2, Arterial 58.2 (L) 83.0 - 108.0 mmHg   Bicarbonate 28.0 20.0 - 28.0 mmol/L   Acid-Base Excess 4.4 (H) 0.0 - 2.0 mmol/L   O2 Saturation 88.7 %   Patient temperature 37.0    Collection site LEFT RADIAL    Drawn by [id number]    Sample type ARTERIAL DRAW    Allens test (pass/fail) POSITIVE (A) PASS    Studies/Results:  CTA LUNG IMPRESSION: Manubrial fracture  No evidence of pulmonary emboli.  Left lower lobe infiltrate and effusion.     BRAIN MRI FINDINGS: Brain: Cerebral volume is within normal limits for age. No restricted diffusion to suggest acute infarction. No midline shift, mass effect, evidence of mass lesion, ventriculomegaly, extra-axial collection or acute intracranial hemorrhage. Cervicomedullary junction and pituitary are within normal limits.  Pearline Cables and white matter signal is within normal limits for age throughout  the brain. No cortical encephalomalacia or chronic cerebral blood products.  Vascular: Major intracranial vascular flow voids are preserved.  Skull and upper cervical spine: Negative. Normal bone marrow signal.  Sinuses/Orbits: Normal orbits soft tissues. Trace paranasal sinus mucosal thickening.  Other: Visible internal auditory structures appear normal. Mastoids are clear. Negative scalp soft tissues.  IMPRESSION: No acute intracranial abnormality. Normal for age noncontrast MRI appearance of the brain.            L SPINE MRI 2015 FINDINGS: Trace anterolisthesis of L3 on L4 and L4 on L5 is unchanged. There is grade 1 retrolisthesis of L1 on L2, stable to minimally increased from prior MRI. New from the prior MRI is a mild compression deformity involving the L2 superior endplate with approximately 10% vertebral body height loss. There is abnormal fluid signal within the L1-2 disc space, and there is  also new edema throughout the L1 and L2 vertebral bodies. There is paraspinal inflammatory change/ phlegmon at this level, and there is a 1.9 x 1.3 cm fluid collection in the left psoas muscle at the L2 level (series 5, image 16).  Advanced disc space height loss at L5-S1 is unchanged, however there is increased fluid signal within the disc space compared to the prior MRI with mildly increased marrow edema within the adjacent L5 and S1 vertebral bodies. The conus medullaris is normal in signal and terminates at the superior aspect of L2.  T11-12: Only imaged sagittally. Facet hypertrophy results in mild bilateral neural foraminal narrowing, right greater than left and unchanged.  T12-L1:  Negative.  L1-2: Sequelae of prior laminectomy and posterior fusion are again identified. Posterior retropulsion of the superior L2 endplate, greater on the right, with possibly small adjacent epidural fluid collection, results in new narrowing of the right lateral recess  and increased right neural foraminal narrowing. No spinal canal stenosis.  L2-3: Mild disc bulge and facet hypertrophy result in mild right lateral recess and neural foraminal narrowing, unchanged.  L3-4: Mild disc bulge and moderate facet hypertrophy result in mild right neural foraminal narrowing, unchanged. No spinal canal stenosis.  L4-5: Left foraminal disc protrusion and mild-to-moderate facet hypertrophy result in mild to moderate left neural foraminal stenosis, unchanged. No spinal canal stenosis.  L5-S1: Disc bulge and mild facet hypertrophy result in mild to moderate bilateral neural foraminal stenosis, unchanged. No spinal canal stenosis.  IMPRESSION: 1. Changes at L1-2 as above concerning for discitis/osteomyelitis with small left psoas abscess. New, mild compression deformity of the L2 superior endplate and mild retropulsion results in new right lateral recess narrowing and increased right neural foraminal narrowing at L1-2. Small ventral epidural fluid collection is not excluded on the right. 2. Advanced degenerative disc disease at L5-S1 with a small amount of new fluid signal in the disc space and mildly increased adjacent marrow changes. Early discitis/osteomyelitis is not excluded.       Lateefa Crosby A. [name], M.D.  Diplomate, Tax adviser of Psychiatry and Neurology ( Neurology). 06/18/2016, 7:51 AM

## 2016-06-18 NOTE — Progress Notes (Signed)
PROGRESS NOTE  MAIKA KACZMAREK W[facility]:[id number] DOB: Jun 12, 1950 DOA: 06/03/2016 PCP: [name] Patient  Brief Narrative: 66 year old man multiple medical problems presented with worsening bilateral lower extremity weakness resulting in multiple falls and traumatic skin tears on the feet and knees, also reported severe abdominal pain with fullness sensation in lower abdomen. Admitted for UTI, acute kidney injury, generalized weakness. He was evaluated by therapy who recommended skilled nursing facility. 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 He was treated with IVIG. Approximately 1 week into the hospitalization he developed acute encephalopathy with decreased mentation, was transferred to the stepdown unit and started on Precedex. He quickly decompensated and was intubated 2/7. He subsequently developed hypotension requiring vasopressor support.  Assessment/Plan 1. Acute hypoxic respiratory failure requiring ventilator support  Hopefully can wean. Appreciate pulmonology involvement. Weaning complicated by previous episodes of hypoxia and agitation requiring significant sedation.  2. [name] variant of GBS, status post IVIG treatment  3. Hypotension requiring vasopressor support. Significance unclear, suspect artifactual. Even when "hypotension" he remains alert and perfusion does not appear to be impaired. Arterial line placement was unsuccessful. Echocardiogram this admission was reassuring.  Given overall clinical stability and suspected artifactual nature, we'll continue current management and defer reattempt at arterial line placement. Wean vasopressor as tolerated.  4. Acute encephalopathy thought to be toxic metabolic in nature.   Does follow commands and is quite alert at times, but sporadically agitated, continues to require infusion of fentanyl and midazolam  5. Diabetes mellitus type 2? Do  not see that he is on any home medications for this and blood sugars have remained quite stable even on tube feeds.  Blood sugars remain stable. Check hemoglobin A1c. Monitor clinically.  6. Acute kidney injury secondary to prerenal azotemia, resolved. 7. Hyponatremia secondary to hypovolemic status. Resolved. 8. PMH multiple lumbar/spinal surgeries, chronic lumbar discitis and osteomyelitis on suppressive therapy with doxycycline, urinary retention requiring self bladder catheterization since 2014  9. Chronic lower abdominal pain. CT abdomen and pelvis no acute finding. Protonix for esophagitis. 10. 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 Reported progressive spasticity arms and legs. 11. Peripheral arterial disease 12. Status post enterococcus UTI associated with outpatient in/out self bladder catheterizations 13. Moderate malnutrition 14. Manubrial fracture seen on chest CT. Follow-up as an outpatient. 15. Right upper lobe nodule. Consider chest CT in 12 months. 16. Aortic atherosclerosis   Prognosis remains guarded, hopefully can be weaned off the ventilator. Will discuss again with sister today. Continue to pursue LTAC.  DVT proph: Lovenox Code Status: full code Family Communication: As above Disposition Plan: pending   [name], MD  [facility] Direct contact: [phone] --Via amion app OR  --[email or web address]; password [email or web address]  7PM-7AM contact night coverage as above 06/18/2016, 11:07 AM  LOS: 15 days   Consultants:  Neurology  Pulmonology  Procedures:  Intubation 2/7>>   Lumbar puncture 2/8  EEG IMPRESSION: This is a normal recording of the awake and drowsy states.  Echo Study Conclusions  - Left ventricle: The cavity size was normal. Wall thickness was   increased in a pattern of mild LVH. Systolic function was normal.   The estimated ejection fraction was  in the range of 60% to 65%.   Doppler parameters are consistent with abnormal left ventricular   relaxation (grade 1 diastolic dysfunction). - Aortic valve: Calcified non coronary cusp. Valve area (VTI):  1.83   cm^2. Valve area (Vmax): 1.83 cm^2. Valve area (Vmean): 1.84   cm^2. - Atrial septum: No defect or patent foramen ovale was identified. - Pulmonary arteries: PA peak pressure: 39 mm Hg (S). Antimicrobials:  None today  Interval history/Subjective: Remains intubated, sedated. On fentanyl and midazolam infusions. Sporadic hypotension noted, currently on phenylephrine. Discussed with nursing, no issues with tube feeds overnight. Agitation and pain remained an issue.  Objective: Vitals:   06/18/16 0516 06/18/16 0750 06/18/16 0800 06/18/16 1050  BP:   (!) 86/56   Pulse:      Resp:   14   Temp:      TempSrc:      SpO2: 94% 96%  95%  Weight:      Height:        Intake/Output Summary (Last 24 hours) at 06/18/16 1107 Last data filed at 06/18/16 1040  Gross per 24 hour  Intake          1954.51 ml  Output             2000 ml  Net           -45.49 ml     Filed Weights   06/14/16 0500 06/15/16 0450 06/16/16 0500  Weight: 73.7 kg (162 lb 7.7 oz) 75.2 kg (165 lb 12.6 oz) 74.9 kg (165 lb 2 oz)    Exam:    Constitutional. Intubated, sedated but does follow simple commands. Appears ill, thin but not toxic.  Eyes. Pupils, irises, lids appear unremarkable.  ENT. Lips appear unremarkable. Intubated.  Cardiovascular. Regular rate and rhythm. No murmur, rub or gallop. No lower extremity edema. Telemetry sinus tachycardia.  Respiratory. Clear to auscultation bilaterally. No wheezes, rales or rhonchi. Normal respiratory effort.  Abdomen soft, nontender, nondistended  Skin. Multiple abrasions over bilateral lower extremities again noted.  Musculoskeletal. Follows simple commands, moves all extremities.  Psychiatric. Cannot assess.     I have personally reviewed following  labs and imaging studies:  Urine output 2000  +1.3 L  ABG 7.43/43/58  BMP unremarkable  Scheduled Meds: . chlorhexidine gluconate (MEDLINE KIT)  15 mL Mouth Rinse BID  . Chlorhexidine Gluconate Cloth  6 each Topical Daily  . doxycycline (VIBRAMYCIN) IV  100 mg Intravenous Q12H  . enoxaparin (LOVENOX) injection  40 mg Subcutaneous Q24H  . famotidine  20 mg Oral Daily  . haloperidol  5 mg Oral TID   Or  . haloperidol lactate  5 mg Intramuscular TID  . mouth rinse  15 mL Mouth Rinse QID  . pantoprazole (PROTONIX) IV  40 mg Intravenous Daily  . polyvinyl alcohol  1 drop Both Eyes TID  . QUEtiapine  50 mg Oral QHS  . senna-docusate  1 tablet Oral QHS  . sodium chloride flush  10-40 mL Intracatheter Q12H  . thiamine injection  100 mg Intravenous Daily   Continuous Infusions: . feeding supplement (VITAL AF 1.2 CAL) 1,000 mL (06/18/16 1040)  . fentaNYL infusion INTRAVENOUS Stopped (06/18/16 1040)  . midazolam (VERSED) infusion Stopped (06/18/16 1040)  . 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

## 2016-06-18 NOTE — Evaluation (Signed)
Occupational Therapy Evaluation Patient Details Name: KEYMONI KAMRADT MRN: [id number] DOB: 1950/09/15 Today's Date: 06/18/2016    History of Present Illness 66 year old man with multiple lumbar/spinal surgeries, history of lumbar discitis and osteomyelitis-on chronic oral doxycycline, urinary retention requiring self bladder catheterizations since 2014, chronic lower abdominal pain, and HTN, who presented to the ED on 06/03/16 for lower abdominal pain and generalized weakness. He has had multiple surgeries on his left lower extremity. Patient has been on chronic doxycycline for chronic discitis and osteomyelitis since 2015. Patient has a urine analysis positive for infection from January 26 with culture positive for Enterococcus faecalis.  Dx: UTI, mild lactic acidosis without sepsis, 48mm R upper lung nodule, acute encephalopathy with mental status significantly declining on 06/09/2016 possibly due to UTI or adverse effect of Levaquin.  Hypovolemic shock.  Acute respiratory failure and intubation for airway protection.  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 Completed 5 day course of IV IG   Clinical Impression   Pt received in bed, intubated, awake and able to communicate via nodding/shaking head for yes/no. Per chart review pt was previously independent in all B/ADL tasks and uses a cane for functional mobility. Evaluation completed at bed level, however is pain limited. Pt with profound weakness, also with ataxia in all four extremities. Pt able to respond to commands with increased time, however ataxia limits accuracy. Pt currently requires total care for all self-care tasks and functional mobility. Recommend LTACH on discharge due to severity of illness and impairments limiting all self-care and mobility completion.      Follow Up Recommendations  LTACH;SNF    Equipment  Recommendations  None recommended by OT       Precautions / Restrictions Precautions Precautions: Fall Precaution Comments: 100 in the past 6 months.  L knee is popping in and out.  Pt is very ataxic  Restrictions Weight Bearing Restrictions: No      Mobility Bed Mobility               General bed mobility comments: Not attempted   Transfers                 General transfer comment: Not attempted         ADL Overall ADL's : Needs assistance/impaired                                       General ADL Comments: Pt is currently total assistance for all ADL completion               Pertinent Vitals/Pain Pain Assessment: Faces Faces Pain Scale: Hurts little more Pain Intervention(s): Limited activity within patient's tolerance;Monitored during session;Repositioned     Hand Dominance Left   Extremity/Trunk Assessment Upper Extremity Assessment Upper Extremity Assessment: LUE deficits/detail RUE Deficits / Details: Ataxia throughout, increased time required for response to requested movement.  2/5 shoulder flexion and elbow extension, weak grip strength RUE Sensation: decreased proprioception RUE Coordination: decreased gross motor;decreased fine motor LUE Deficits / Details: Ataxia throughout, increased time required for response to requested movement.  2/5 shoulder flexion and elbow extension, weak grip strength LUE Sensation: decreased proprioception LUE Coordination: decreased fine motor;decreased gross motor   Lower Extremity Assessment Lower Extremity Assessment: Defer to PT evaluation       Communication Communication Communication: Other (  comment) (pt is intubated-can nod yes/shake head no)   Cognition Arousal/Alertness: Awake/alert Behavior During Therapy: WFL for tasks assessed/performed Overall Cognitive Status: Difficult to assess                                Home Living   Living Arrangements:  Alone Available Help at Discharge: Neighbor Type of Home: House Home Access: Stairs to enter [employer] of Steps: 2 steps, 7 steps at the back   Home Layout: One level     Bathroom Shower/Tub: Occupational psychologist: Handicapped height     Home Equipment: [place] - single point;Bedside commode;Shower seat;Wheelchair - [employer] - 2 wheels          Prior Functioning/Environment Level of Independence: Independent with assistive device(s)  Gait / Transfers Assistance Needed: pt states he normally uses his cane or a piece of PVC pipe for ambulation.  ADL's / Homemaking Assistance Needed: independent with dressing and bathing.  Driving and is able to go run errands.              OT Problem List: Decreased strength;Decreased range of motion;Decreased activity tolerance;Impaired balance (sitting and/or standing);Decreased coordination;Decreased cognition;Decreased safety awareness;Decreased knowledge of precautions;Decreased knowledge of use of DME or AE;Impaired sensation;Impaired tone;Impaired UE functional use;Pain   OT Treatment/Interventions: Self-care/ADL training;Therapeutic exercise;Neuromuscular education;Energy conservation;DME and/or AE instruction;Therapeutic activities;Cognitive remediation/compensation;Patient/family education    OT Goals(Current goals can be found in the care plan section) Acute Rehab OT Goals Patient Stated Goal: None stated OT Goal Formulation: Patient unable to participate in goal setting Time For Goal Achievement: 07/02/16 Potential to Achieve Goals: Good  OT Frequency: Min 2X/week    End of Session    Activity Tolerance: Patient limited by pain Patient left: in bed;with call bell/phone within reach;with bed alarm set   Time: [id number] OT Time Calculation (min): 14 min Charges:  OT General Charges $OT Visit: 1 Procedure OT Evaluation $OT Eval Moderate Complexity: 1 Procedure Guadelupe Sabin, OTR/L   [phone] 06/18/2016, 12:16 PM

## 2016-06-18 NOTE — Progress Notes (Signed)
Fentaynl 200cc & versed 40cc waisted in sink & witnessed by [employer] RN

## 2016-06-18 NOTE — Progress Notes (Signed)
Physical Therapy Treatment Patient Details Name: Marc Rose MRN: [id number] DOB: [birth year] Today's Date: 06/18/2016    History of Present Illness 66 year old man with multiple lumbar/spinal surgeries, history of lumbar discitis and osteomyelitis-on chronic oral doxycycline, urinary retention requiring self bladder catheterizations since 2014, chronic lower abdominal pain, and HTN, who presented to the ED on 06/03/16 for lower abdominal pain and generalized weakness. He has had multiple surgeries on his left lower extremity. Patient has been on chronic doxycycline for chronic discitis and osteomyelitis since 2015. Patient has a urine analysis positive for infection from January 26 with culture positive for Enterococcus faecalis.  Dx: UTI, mild lactic acidosis without sepsis, 70mm R upper lung nodule, acute encephalopathy with mental status significantly declining on 06/09/2016 possibly due to UTI or adverse effect of Levaquin.  Hypovolemic shock.  Acute respiratory failure and intubation for airway protection.  Marc Rose variant of the Marc Rose syndrome with the patient presenting with ataxia, ophthalmoplegia and areflexia and classic CSF findings. Completed 5 day course of IV IG    PT Comments    Pt received in bed, and was agreeable to PT tx.  Pt attempting to move UE's today with OT, however, remains ataxic.  He also demonstrates L UE swelling.  Attempted to have pt perform bed level exercises today, however he demonstrated withdraw with movement as well as grimacing and expressed increased pain with movement.  He then fell asleep.  Continue to recommend LTACH at d/c.   Follow Up Recommendations  LTACH;SNF;Supervision/Assistance - 24 hour     Equipment Recommendations  None recommended by PT    Recommendations for Other Services       Precautions / Restrictions Precautions Precautions: Fall Precaution Comments: 100 in the past 6 months.  L knee is popping in and out.  Pt is  very ataxic  Restrictions Weight Bearing Restrictions: No Other Position/Activity Restrictions: PEG tube, ventillator support, B wrist restraints    Mobility  Bed Mobility               General bed mobility comments: Not attempted today - pt is grimicing quite a bit, and withdrawing with movement.    Transfers                 General transfer comment: Not attempted  Ambulation/Gait                 Stairs            Wheelchair Mobility    Modified Rankin (Stroke Patients Only)       Balance                                    Cognition Arousal/Alertness: Lethargic (falling asleep during session today. ) Behavior During Therapy: WFL for tasks assessed/performed Overall Cognitive Status: Difficult to assess                 General Comments: Pt is able to answer yes/no questions with slight nod of the head.  Pt is unable to give thumbs up sign due to weakness in UE's.      Exercises General Exercises - Upper Extremity Shoulder Flexion: AAROM;5 reps;Supine;Both (Pt able to assist 3/5 trials, P/ROM completed for 2/5) General Exercises - Lower Extremity Heel Slides: AAROM;Both;5 reps;Limitations Heel Slides Limitations: Pt begins to withdraw strongly with this activity and confirmed that it was causing pain.   Hip ABduction/ADduction: PROM;Both;5  reps;Limitations Hip Abduction/Adduction Limitations: Pt falling asleep during session.     General Comments        Pertinent Vitals/Pain Pain Assessment: Faces Faces Pain Scale: Hurts whole lot Pain Location: Pt c/o pain with movement to his UE's and LE's.   Pain Descriptors / Indicators: Grimacing;Other (Comment) (withdraw) Pain Intervention(s): Limited activity within patient's tolerance;Monitored during session;Repositioned    Home Living   Living Arrangements: Alone Available Help at Discharge: Neighbor Type of Home: House Home Access: Stairs to enter   Home Layout:  One level Home Equipment: Cane - single point;Bedside commode;Shower seat;Wheelchair - [employer] - 2 wheels      Prior Function Level of Independence: Independent with assistive device(s)  Gait / Transfers Assistance Needed: pt states he normally uses his cane or a piece of PVC pipe for ambulation.  ADL's / Homemaking Assistance Needed: independent with dressing and bathing.  Driving and is able to go run errands.       PT Goals (current goals can now be found in the care plan section) Acute Rehab PT Goals Patient Stated Goal: None stated Time For Goal Achievement: 06/29/16 Potential to Achieve Goals: Fair Progress towards PT goals: Not progressing toward goals - comment (due to increased pain)    Frequency    Min 6X/week      PT Plan Current plan remains appropriate    Co-evaluation PT/OT/SLP Co-Evaluation/Treatment: Yes Reason for Co-Treatment: Complexity of the patient's impairments (multi-system involvement);Necessary to address cognition/behavior during functional activity;For patient/therapist safety PT goals addressed during session: Strengthening/ROM;Mobility/safety with mobility       End of Session Equipment Utilized During Treatment: Other (comment) (ventillator. ) Activity Tolerance: Patient limited by fatigue;Patient limited by pain Patient left: in bed;with call bell/phone within reach;with nursing/sitter in room     Time: 0845-0900 PT Time Calculation (min) (ACUTE ONLY): 15 min  Charges:                       G CodesWells Guiles Makel Mcmann 2016-06-19, 1:44 PM

## 2016-06-18 NOTE — Procedures (Signed)
Extubation Procedure Note  Patient Details:   Name: Marc Rose DOB: 1950/11/27 MRN: [id number]   Airway Documentation:  Airway 8 mm (Active)  Secured at (cm) 24 cm 06/18/2016 12:18 PM  Measured From Lips 06/18/2016 12:18 PM  Secured Location Left 06/18/2016 12:18 PM  Secured By [employer] 06/18/2016 12:18 PM  Tube Holder Repositioned Yes 06/18/2016 10:50 AM  Cuff Pressure (cm H2O) 24 cm H2O 06/18/2016 12:18 PM  Site Condition Dry 06/18/2016 12:18 PM    Evaluation  O2 sats: stable throughout Complications: No apparent complications Patient did tolerate procedure well. Bilateral Breath Sounds: Diminished   Yes patient was able to speak.He was placed on a 4L [place]  Post extubation and an SpO2 of 94% was obtained.  Pete Pelt 06/18/2016, 2:24 PM

## 2016-06-18 NOTE — Care Management (Signed)
Patient extubated. LTAC referral process has been stopped.

## 2016-06-18 NOTE — Progress Notes (Signed)
Physical Therapy Treatment Patient Details Name: Marc Rose MRN: [id number] DOB: 03/31/1951 Today's Date: 06/18/2016    History of Present Illness 66 year old man with multiple lumbar/spinal surgeries, history of lumbar discitis and osteomyelitis-on chronic oral doxycycline, urinary retention requiring self bladder catheterizations since 2014, chronic lower abdominal pain, and HTN, who presented to the ED on 06/03/16 for lower abdominal pain and generalized weakness. He has had multiple surgeries on his left lower extremity. Patient has been on chronic doxycycline for chronic discitis and osteomyelitis since 2015. Patient has a urine analysis positive for infection from January 26 with culture positive for Enterococcus faecalis.  Dx: UTI, mild lactic acidosis without sepsis, 46mm R upper lung nodule, acute encephalopathy with mental status significantly declining on 06/09/2016 possibly due to UTI or adverse effect of Levaquin.  Hypovolemic shock.  Acute respiratory failure and intubation for airway protection.  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 Completed 5 day course of IV IG.  Pt extubated earlier today (06/18/16) and now on 4L of O2.     PT Comments    Pt received in bed, he is now extubated, however it is difficult to understand when he speaks due to extensive hoarseness.  He continues to demonstrate trunk and all 4 extremity ataxia.  He required Max A for transfer supine<>sit and he was able to maintain sitting balance for ~8 min while working on finding his midline, and progressing with right and left weight shifting, as well as right and left rotation.  Pt also worked on upright posture.  PT assisted with B foot positioning while pt was performing balance tasks.  Pt was assisted back into the bed as he is not safe to attempt transfers or sitting in a chair due to ataxia.  He is not safe  to return home alone, and will likely require quite an extensive and prolonged rehabilitation.  Therefore, continue to strongly recommend LTACH.     Follow Up Recommendations  LTACH;SNF;Supervision/Assistance - 24 hour     Equipment Recommendations  None recommended by PT    Recommendations for Other Services       Precautions / Restrictions Precautions Precautions: Fall Precaution Comments: 100 in the past 6 months.  L knee is popping in and out.  Pt is very ataxic  Restrictions Weight Bearing Restrictions: No Other Position/Activity Restrictions: PEG tube    Mobility  Bed Mobility Overal bed mobility: Needs Assistance Bed Mobility: Supine to Sit;Sit to Supine     Supine to sit: Max assist;HOB elevated Sit to supine: Max assist   General bed mobility comments: Pt continues to demonstrate ataxia in all extremites and trunk.  As pt attempts to sit on the EOB, LE's quickly flail out to the side.  Used SCD wraps to assist with mobilizing pt's LE's off the EOB safely.  He was able to sit on the EOB for ~8 min today with varying levels of assistance due to truncal ataxia.  PT assisted with maintaining pt's feet on the floor in an appropriate position to assist with his sitting balance.    Transfers Overall transfer level:  (Not appropriate at this time due to continued truncal and all extremity ataxia.  )                  Ambulation/Gait                 Stairs  Wheelchair Mobility    Modified Rankin (Stroke Patients Only)       Balance Overall balance assessment: History of Falls;Needs assistance Sitting-balance support: Bilateral upper extremity supported;Feet supported Sitting balance-Leahy Scale: Zero Sitting balance - Comments: Mod A required for right and left weight shifting down onto elbows.  Pt able to complete 5 reps each direction.  Constant vc's for upright sitting posture as pt tends to have a very flexed forward posture.  Max A  for trunk rotationx 2 reps each direction.   Postural control: Posterior lean                          Cognition Arousal/Alertness: Awake/alert Behavior During Therapy: WFL for tasks assessed/performed                        Exercises      General Comments        Pertinent Vitals/Pain Pain Assessment: No/denies pain    Home Living                      Prior Function            PT Goals (current goals can now be found in the care plan section) Acute Rehab PT Goals Patient Stated Goal: None stated Time For Goal Achievement: 06/29/16 Potential to Achieve Goals: Fair Progress towards PT goals: Progressing toward goals    Frequency    Min 6X/week      PT Plan Current plan remains appropriate    Co-evaluation             End of Session   Activity Tolerance: Patient limited by fatigue Patient left: in bed;with call bell/phone within reach;with nursing/sitter in room     Time: 1455-1511 PT Time Calculation (min) (ACUTE ONLY): 16 min  Charges:  $Neuromuscular Re-education: 8-22 mins                    G Codes:  Functional Assessment Tool Used: [employer] "6-clicks" Functional Limitation: Mobility: Walking and moving around Mobility: Walking and Moving Around Current Status [phone]): At least 80 percent but less than 100 percent impaired, limited or restricted Mobility: Walking and Moving Around Goal Status [phone]): At least 60 percent but less than 80 percent impaired, limited or restricted   Beth Zigmond Trela, PT, DPT X: [phone]

## 2016-06-19 ENCOUNTER — Inpatient Hospital Stay (HOSPITAL_COMMUNITY): Payer: PPO

## 2016-06-19 LAB — GLUCOSE, CAPILLARY
GLUCOSE-CAPILLARY: 111 mg/dL — AB (ref 65–99)
GLUCOSE-CAPILLARY: 113 mg/dL — AB (ref 65–99)
GLUCOSE-CAPILLARY: 128 mg/dL — AB (ref 65–99)
GLUCOSE-CAPILLARY: 146 mg/dL — AB (ref 65–99)
Glucose-Capillary: 119 mg/dL — ABNORMAL HIGH (ref 65–99)
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
## 2016-06-19 NOTE — Progress Notes (Signed)
Patient has had a great day. He is regaining control and strength of his extremities slowly, but still occasionally throws legs out of bed (has no control over he states). All four side rails remain up for patient safety.   His speech is still slurred and very hard to understand. Pain has been managed via PRN pain meds and repositioning. Patient is consuming more food, but he is becoming strangled on thin liquids. I recommend patient be switched back to nectar thick liquids, and sit as far up as possible.  Patient has been oriented and alert all day. Patient is very polite and patient with staff as we try to understand what he is conveying to [place].   Will continue to monitor patient  Margaret Pyle, RN

## 2016-06-19 NOTE — Progress Notes (Signed)
PROGRESS NOTE  Marc Rose PTW:[id number] DOB: May 19, 1950 DOA: 06/03/2016 PCP: [name] Patient  Brief Narrative: 66 year old man multiple medical problems presented with worsening bilateral lower extremity weakness resulting in multiple falls and traumatic skin tears on the feet and knees, also reported severe abdominal pain with fullness sensation in lower abdomen. Admitted for UTI, acute kidney injury, generalized weakness. He was evaluated by therapy who recommended skilled nursing facility. 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 He was treated with IVIG. Approximately 1 week into the hospitalization he developed acute encephalopathy with decreased mentation, was transferred to the stepdown unit and started on Precedex. He quickly decompensated and was intubated 2/7. He subsequently developed hypotension requiring vasopressor support. He was successfully extubated 2/16.  Assessment/Plan 1. Acute hypoxic respiratory failure requiring ventilator support  Successfully extubated 2/16. Remains quite weak. Continue close monitoring in the ICU setting.  2. [name] variant of GBS, status post IVIG treatment.  3. Dysphagia.   Further management per speech therapy. Continue diet as ordered.  4. Hypotension requiring vasopressor support. Significance unclear, suspect artifactual. Even when was hypotensive, he remained alert. Echocardiogram reassuring.  Appears resolved now. Off vasopressors. Monitor clinically.  5. Acute encephalopathy thought to be toxic metabolic in nature.   Improving status post extubation, off fentanyl and midazolam infusions.  6. Diabetes mellitus type 2? Do not see that he is on any home medications for this and blood sugars have remained quite stable even on tube feeds.  Hemoglobin A1c pending. Monitor clinically.  7. Acute kidney injury secondary to prerenal azotemia,  resolved. 8. Hyponatremia secondary to hypovolemic status. Resolved. 9. PMH multiple lumbar/spinal surgeries, chronic lumbar discitis and osteomyelitis on suppressive therapy with doxycycline, urinary retention requiring self bladder catheterization since 2014  10. Chronic lower abdominal pain. Stable. CT abdomen and pelvis no acute finding. Protonix for esophagitis. 11. 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 Reported progressive spasticity arms and legs. 12. Peripheral arterial disease. Appears stable. 13. Status post enterococcus UTI associated with outpatient in/out self bladder catheterizations 14. Moderate malnutrition 15. Manubrial fracture seen on chest CT. Follow-up as an outpatient. 16. Right upper lobe nodule. Consider chest CT in 12 months. 17. Aortic atherosclerosis   Although prognosis is guarded, he is doing well status post extubation 2/16.  DVT proph: Lovenox Code Status: full code Family Communication: As above Disposition Plan: pending   [name], MD  [facility] Direct contact: [phone] --Via amion app OR  --[email or web address]; password [email or web address]  7PM-7AM contact night coverage as above 06/19/2016, 9:40 AM  LOS: 16 days   Consultants:  Neurology  Pulmonology  Procedures:  Intubation 2/7>> 2/16  Lumbar puncture 2/8  EEG IMPRESSION: This is a normal recording of the awake and drowsy states.  Echo Study Conclusions  - Left ventricle: The cavity size was normal. Wall thickness was   increased in a pattern of mild LVH. Systolic function was normal.   The estimated ejection fraction was in the range of 60% to 65%.   Doppler parameters are consistent with abnormal left ventricular   relaxation (grade 1 diastolic dysfunction). - Aortic valve: Calcified non coronary cusp. Valve area (VTI): 1.83   cm^2. Valve area (Vmax): 1.83 cm^2. Valve area (Vmean):  1.84   cm^2. - Atrial septum: No defect or patent foramen ovale was identified. - Pulmonary arteries: PA peak pressure: 39 mm Hg (S).  Antimicrobials:  None today  Interval history/Subjective: Extubated yesterday, started on a diet per speech therapy. No events overnight per nursing. Phenylephrine has been weaned off. Requests cream for feet which are itching. Breathing okay. Requests Flexeril.  Objective: Vitals:   06/19/16 0500 06/19/16 0600 06/19/16 0700 06/19/16 0723  BP: (!) 153/95 120/72 135/90   Pulse: (!) 110 (!) 110 (!) 107 (!) 103  Resp: (!) 24 (!) _0 Temp:    98.2 F (36.8 C)  TempSrc:    Oral  SpO2: 99% 96% 98% 100%  Weight:      Height:        Intake/Output Summary (Last 24 hours) at 06/19/16 0940 Last data filed at 06/19/16 0800  Gross per 24 hour  Intake          1128.85 ml  Output             2430 ml  Net         -1301.15 ml     Filed Weights   06/15/16 0450 06/16/16 0500 06/19/16 0400  Weight: 75.2 kg (165 lb 12.6 oz) 74.9 kg (165 lb 2 oz) 71.2 kg (156 lb 15.5 oz)    Exam:    Constitutional.Appears very weak but overall better today. Nontoxic.  Respiratory. Clear to auscultation bilaterally. No wheezes, rales or rhonchi. Normal respiratory effort.  Cardiovascular. Regular rate and rhythm. No murmur, rub or gallop. No lower extremity edema.  Abdomen. Soft, nontender, nondistended.  Skin. No change in bilateral lower extremity abrasions.  Musculoskeletal. Moves all extremities. Generally weak.  Eyes. Lids, pupils appear unremarkable.  Neurologic. Speech is somewhat slurred and difficult to understand.  Psychiatric. Alert, follows simple commands, appropriately interactive.   I have personally reviewed following labs and imaging studies:  Urine output 2430  I/O balanced  Blood sugars stable  Scheduled Meds: . chlorhexidine gluconate (MEDLINE KIT)  15 mL Mouth Rinse BID  . Chlorhexidine Gluconate Cloth  6 each Topical Daily  .  doxycycline (VIBRAMYCIN) IV  100 mg Intravenous Q12H  . enoxaparin (LOVENOX) injection  40 mg Subcutaneous Q24H  . famotidine  20 mg Oral Daily  . mouth rinse  15 mL Mouth Rinse QID  . pantoprazole (PROTONIX) IV  40 mg Intravenous Daily  . polyvinyl alcohol  1 drop Both Eyes TID  . QUEtiapine  50 mg Oral QHS  . senna-docusate  1 tablet Oral QHS  . sodium chloride flush  10-40 mL Intracatheter Q12H  . thiamine injection  100 mg Intravenous Daily   Continuous Infusions: . phenylephrine (NEO-SYNEPHRINE) Adult infusion Stopped (06/19/16 0400)    Principal Problem:   Acute respiratory failure (HCC) Active Problems:   HTN (hypertension)   Long term current use of antibiotics   Back pain, chronic   Discitis of lumbar region   Nerve disease, peripheral (HCC)   Neurogenic urinary bladder disorder   Ataxia   Muscle weakness (generalized)   Ataxic gait   Miller-Fisher variant Guillain-Barre syndrome (HCC)   Malnutrition of moderate degree   Acute encephalopathy   Palliative care encounter   Goals of care, counseling/discussion   LOS: 16 days

## 2016-06-19 NOTE — Progress Notes (Signed)
Subjective: He was able to be successfully extubated yesterday and has done well since. He has no complaints this morning. He was evaluated by speech yesterday and because he doesn't have his dentures and because of his difficulty with ataxia he's going to need help with feeding he will need dysphagia 1 diet and nectar thick liquids. He says he is coughing a little bit  Objective: Vital signs in last 24 hours: Temp:  [97.6 F (36.4 C)-100.1 F (37.8 C)] 98.2 F (36.8 C) (02/17 0723) Pulse Rate:  [96-110] 103 (02/17 0723) Resp:  [12-24] 18 (02/17 0723) BP: (89-153)/(49-95) 135/90 (02/17 0700) SpO2:  [91 %-100 %] 100 % (02/17 0723) FiO2 (%):  [40 %] 40 % (02/16 1320) Weight:  [71.2 kg (156 lb 15.5 oz)] 71.2 kg (156 lb 15.5 oz) (02/17 0400) Weight change:  Last BM Date: 06/17/16  Intake/Output from previous day: 02/16 0701 - 02/17 0700 In: 1388 [P.O.:250; I.V.:311.7; NG/GT:326.3; IV Piggyback:500] Out: 0488 [Urine:2300; Emesis/NG output:130]  PHYSICAL EXAM General appearance: alert, cooperative and moderate distress Resp: rhonchi bilaterally Cardio: regular rate and rhythm, S1, S2 normal, no murmur, click, rub or gallop GI: soft, non-tender; bowel sounds normal; no masses,  no organomegaly Extremities: He has multiple scrapes and bruises on his legs from accidents prior to admission He still has ataxia with some ataxic movement of his arms.  Lab Results:  Results for orders placed or performed during the hospital encounter of 06/03/16 (from the past 48 hour(s))  Basic metabolic panel     Status: Abnormal   Collection Time: 06/17/16  9:43 AM  Result Value Ref Range   Sodium 140 135 - 145 mmol/L   Potassium 3.8 3.5 - 5.1 mmol/L   Chloride 106 101 - 111 mmol/L   CO2 30 22 - 32 mmol/L   Glucose, Bld 116 (H) 65 - 99 mg/dL   BUN 26 (H) 6 - 20 mg/dL   Creatinine, Ser 0.62 0.61 - 1.24 mg/dL   Calcium 8.4 (L) 8.9 - 10.3 mg/dL   GFR calc non Af Amer >60 >60 mL/min   GFR calc Af Amer  >60 >60 mL/min    Comment: (NOTE) The eGFR has been calculated using the CKD EPI equation. This calculation has not been validated in all clinical situations. eGFR's persistently <60 mL/min signify possible Chronic Kidney Disease.    Anion gap 4 (L) 5 - 15  Glucose, capillary     Status: Abnormal   Collection Time: 06/17/16 11:09 AM  Result Value Ref Range   Glucose-Capillary 106 (H) 65 - 99 mg/dL  Glucose, capillary     Status: Abnormal   Collection Time: 06/17/16  4:00 PM  Result Value Ref Range   Glucose-Capillary 113 (H) 65 - 99 mg/dL  Glucose, capillary     Status: Abnormal   Collection Time: 06/17/16  9:43 PM  Result Value Ref Range   Glucose-Capillary 105 (H) 65 - 99 mg/dL   Comment 1 Notify RN    Comment 2 Document in Chart   Glucose, capillary     Status: Abnormal   Collection Time: 06/18/16 12:05 AM  Result Value Ref Range   Glucose-Capillary 103 (H) 65 - 99 mg/dL   Comment 1 Notify RN    Comment 2 Document in Chart   Glucose, capillary     Status: None   Collection Time: 06/18/16  4:12 AM  Result Value Ref Range   Glucose-Capillary 92 65 - 99 mg/dL  Basic metabolic panel  Status: Abnormal   Collection Time: 06/18/16  5:15 AM  Result Value Ref Range   Sodium 138 135 - 145 mmol/L   Potassium 3.6 3.5 - 5.1 mmol/L   Chloride 104 101 - 111 mmol/L   CO2 28 22 - 32 mmol/L   Glucose, Bld 112 (H) 65 - 99 mg/dL   BUN 21 (H) 6 - 20 mg/dL   Creatinine, Ser 0.58 (L) 0.61 - 1.24 mg/dL   Calcium 8.5 (L) 8.9 - 10.3 mg/dL   GFR calc non Af Amer >60 >60 mL/min   GFR calc Af Amer >60 >60 mL/min    Comment: (NOTE) The eGFR has been calculated using the CKD EPI equation. This calculation has not been validated in all clinical situations. eGFR's persistently <60 mL/min signify possible Chronic Kidney Disease.    Anion gap 6 5 - 15  Blood gas, arterial     Status: Abnormal   Collection Time: 06/18/16  5:24 AM  Result Value Ref Range   FIO2 35.00    Delivery systems  VENTILATOR    Mode PRESSURE REGULATED VOLUME CONTROL    VT 550 mL   LHR 14 resp/min   Peep/cpap 5.0 cm H20   pH, Arterial 7.433 7.350 - 7.450   pCO2 arterial 43.4 32.0 - 48.0 mmHg   pO2, Arterial 58.2 (L) 83.0 - 108.0 mmHg   Bicarbonate 28.0 20.0 - 28.0 mmol/L   Acid-Base Excess 4.4 (H) 0.0 - 2.0 mmol/L   O2 Saturation 88.7 %   Patient temperature 37.0    Collection site LEFT RADIAL    Drawn by [id number]    Sample type ARTERIAL DRAW    Allens test (pass/fail) POSITIVE (A) PASS  Glucose, capillary     Status: Abnormal   Collection Time: 06/18/16  8:19 AM  Result Value Ref Range   Glucose-Capillary 115 (H) 65 - 99 mg/dL  Glucose, capillary     Status: Abnormal   Collection Time: 06/18/16 11:26 AM  Result Value Ref Range   Glucose-Capillary 118 (H) 65 - 99 mg/dL   Comment 1 Notify RN    Comment 2 Document in Chart   Blood gas, arterial     Status: Abnormal   Collection Time: 06/18/16  1:18 PM  Result Value Ref Range   FIO2 40.00    Delivery systems VENTILATOR    Mode CONTINUOUS POSITIVE AIRWAY PRESSURE    PIP 5.0 cm H2O   Pressure support 5 cm H20   pH, Arterial 7.392 7.350 - 7.450   pCO2 arterial 45.6 32.0 - 48.0 mmHg   pO2, Arterial 76.4 (L) 83.0 - 108.0 mmHg   Bicarbonate 26.5 20.0 - 28.0 mmol/L   Acid-Base Excess 2.6 (H) 0.0 - 2.0 mmol/L   O2 Saturation 94.4 %   Patient temperature 37.0    Collection site LEFT RADIAL    Drawn by [id number]    Sample type ARTERIAL    Allens test (pass/fail) PASS PASS  Glucose, capillary     Status: Abnormal   Collection Time: 06/18/16  3:34 PM  Result Value Ref Range   Glucose-Capillary 121 (H) 65 - 99 mg/dL  Glucose, capillary     Status: Abnormal   Collection Time: 06/18/16  9:38 PM  Result Value Ref Range   Glucose-Capillary 131 (H) 65 - 99 mg/dL   Comment 1 Notify RN    Comment 2 Document in Chart   Glucose, capillary     Status: Abnormal   Collection Time: 06/19/16  7:25 AM  Result Value Ref Range   Glucose-Capillary 128 (H) 65  - 99 mg/dL    ABGS  Recent Labs  06/18/16 1318  PHART 7.392  PO2ART 76.4*  HCO3 26.5   CULTURES Recent Results (from the past 240 hour(s))  MRSA PCR Screening     Status: None   Collection Time: 06/09/16  5:00 PM  Result Value Ref Range Status   MRSA by PCR NEGATIVE NEGATIVE Final    Comment:        The GeneXpert MRSA Assay (FDA approved for NASAL specimens only), is one component of a comprehensive MRSA colonization surveillance program. It is not intended to diagnose MRSA infection nor to guide or monitor treatment for MRSA infections.   Culture, blood (routine x 2)     Status: None   Collection Time: 06/09/16  7:15 PM  Result Value Ref Range Status   Specimen Description BLOOD RIGHT HAND  Final   Special Requests BOTTLES DRAWN AEROBIC AND ANAEROBIC 6CC  Final   Culture NO GROWTH 6 DAYS  Final   Report Status 06/15/2016 FINAL  Final  Culture, blood (routine x 2)     Status: None   Collection Time: 06/09/16  7:20 PM  Result Value Ref Range Status   Specimen Description BLOOD LEFT HAND  Final   Special Requests BOTTLES DRAWN AEROBIC AND ANAEROBIC 6CC  Final   Culture NO GROWTH 6 DAYS  Final   Report Status 06/15/2016 FINAL  Final  CSF culture     Status: None   Collection Time: 06/10/16  8:00 AM  Result Value Ref Range Status   Specimen Description CSF  Final   Special Requests Normal  Final   Gram Stain   Final    NO ORGANISMS SEEN NO WBC SEEN CYTOSPIN SMEAR Gram Stain Report Called to,Read Back By and Verified With: WILLIE E. AT [id number] ON [id number] BY THOMPSON S.    Culture   Final    NO GROWTH 3 DAYS Performed at [facility], [place] [address], [place], [place] [address]    Report Status 06/13/2016 FINAL  Final   Studies/Results: No results found.  Medications:  Prior to Admission:  Prescriptions Prior to Admission  Medication Sig Dispense Refill Last Dose  . aspirin EC 81 MG tablet Take 81 mg by mouth daily.   Past Week at Unknown time  .  benazepril (LOTENSIN) 40 MG tablet Take 40 mg by mouth daily.   Past Week at Unknown time  . cyclobenzaprine (FLEXERIL) 10 MG tablet Take 1 tablet (10 mg total) by mouth 3 (three) times daily as needed for muscle spasms. 30 tablet 0 Past Week at Unknown time  . diazepam (VALIUM) 10 MG tablet Take 0.5 tablets (5 mg total) by mouth every 6 (six) hours as needed for anxiety (back spasms). (Patient taking differently: Take 10 mg by mouth 4 (four) times daily as needed for anxiety (back spasms). ) 30 tablet 0 Past Week at Unknown time  . doxycycline (VIBRA-TABS) 100 MG tablet Take 1 tablet (100 mg total) by mouth 2 (two) times daily. 60 tablet 11 Past Week at Unknown time  . fluticasone (FLONASE) 50 MCG/ACT nasal spray Place 1 spray into the nose daily as needed for allergies.    unknown  . NEOMYCIN-POLYMYXIN-HYDROCORTISONE (CORTISPORIN) 1 % SOLN otic solution Place 2 drops in ear(s) daily. Reported on 11/12/2015   Past Week at Unknown time  . pregabalin (LYRICA) 75 MG capsule Take 75 mg by mouth 2 (two)  times daily.    Past Week at Unknown time  . tamsulosin (FLOMAX) 0.4 MG CAPS capsule Take 0.4 mg by mouth.   Past Week at Unknown time   Scheduled: . chlorhexidine gluconate (MEDLINE KIT)  15 mL Mouth Rinse BID  . Chlorhexidine Gluconate Cloth  6 each Topical Daily  . doxycycline (VIBRAMYCIN) IV  100 mg Intravenous Q12H  . enoxaparin (LOVENOX) injection  40 mg Subcutaneous Q24H  . famotidine  20 mg Oral Daily  . mouth rinse  15 mL Mouth Rinse QID  . pantoprazole (PROTONIX) IV  40 mg Intravenous Daily  . polyvinyl alcohol  1 drop Both Eyes TID  . QUEtiapine  50 mg Oral QHS  . senna-docusate  1 tablet Oral QHS  . sodium chloride flush  10-40 mL Intracatheter Q12H  . thiamine injection  100 mg Intravenous Daily   Continuous: . phenylephrine (NEO-SYNEPHRINE) Adult infusion Stopped (06/19/16 0400)   ORV:[id number] **OR** acetaminophen, bisacodyl, fentaNYL (SUBLIMAZE) injection, haloperidol lactate,  magnesium hydroxide, ondansetron **OR** ondansetron (ZOFRAN) IV, sodium chloride flush  Assesment: He was admitted with what was thought to be a urinary tract infection. He has discitis in his back and has been on long-term antibiotics. 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 He still has ataxia. He required intubation and mechanical ventilation but was taken off the ventilator yesterday and has done well so far. He is at high risk for aspiration. Still relatively high risk for worsening respiratory failure Principal Problem:   Acute respiratory failure (HCC) Active Problems:   HTN (hypertension)   Long term current use of antibiotics   Back pain, chronic   Discitis of lumbar region   Nerve disease, peripheral (HCC)   Neurogenic urinary bladder disorder   Ataxia   Muscle weakness (generalized)   Ataxic gait   Miller-Fisher variant Guillain-Barre syndrome (HCC)   Malnutrition of moderate degree   Acute encephalopathy   Palliative care encounter   Goals of care, counseling/discussion    Plan: Continue current treatments.    LOS: 16 days   Izeah Vossler L 06/19/2016, 9:25 AM

## 2016-06-19 NOTE — Progress Notes (Signed)
Physical Therapy Treatment Patient Details Name: GABIREL FLETCHER MRN: [id number] DOB: 01-20-1951 Today's Date: 06/19/2016    History of Present Illness 66 year old man with multiple lumbar/spinal surgeries, history of lumbar discitis and osteomyelitis-on chronic oral doxycycline, urinary retention requiring self bladder catheterizations since 2014, chronic lower abdominal pain, and HTN, who presented to the ED on 06/03/16 for lower abdominal pain and generalized weakness. He has had multiple surgeries on his left lower extremity. Patient has been on chronic doxycycline for chronic discitis and osteomyelitis since 2015. Patient has a urine analysis positive for infection from January 26 with culture positive for Enterococcus faecalis.  Dx: UTI, mild lactic acidosis without sepsis, 47mm R upper lung nodule, acute encephalopathy with mental status significantly declining on 06/09/2016 possibly due to UTI or adverse effect of Levaquin.  Hypovolemic shock.  Acute respiratory failure and intubation for airway protection.  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 Completed 5 day course of IV IG    PT Comments    Pt able to assist with LE exercises with ability to complete ankle pumps, quad sets, glut sets I, with min Assist for coordination with heel slides and abduction.  Pt continues to make small gains.  Sitting balance Is still poor with pt unable to keep erect for any length of time but therapist is able to feel abdominal mm  Contracting in attempt to gain balance back.   Pt needed min assist only for rolling activity with noted  Abdominal contraction   Follow Up Recommendations  LTACH;SNF;Supervision/Assistance - 24 hour     Equipment Recommendations  None recommended by PT    Recommendations for Other Services       Precautions / Restrictions Precautions Precautions:  Fall Restrictions Weight Bearing Restrictions: No    Mobility  Bed Mobility Overal bed mobility: Needs Assistance Bed Mobility: Supine to Sit;Sit to Supine Rolling: Min assist   Supine to sit: Max assist Sit to supine: Max assist   General bed mobility comments: Pt continues to have ataxia but appears slightly better compared to previous descripsion  Transfers Overall transfer level:  (Not appropriate at this time due to continued truncal and all extremity ataxia.  )                  Ambulation/Gait                 Stairs            Wheelchair Mobility    Modified Rankin (Stroke Patients Only)       Balance Overall balance assessment: Needs assistance   Sitting balance-Leahy Scale: Poor   Postural control: Posterior lean                          Cognition Arousal/Alertness: Awake/alert Behavior During Therapy: WFL for tasks assessed/performed   Area of Impairment: Orientation                    Exercises General Exercises - Upper Extremity Shoulder Flexion: Both;5 reps Elbow Flexion: Both;10 reps;AAROM Elbow Extension: Both;10 reps Wrist Flexion: Both;10 reps;AAROM Wrist Extension: Both;10 reps;AAROM General Exercises - Lower Extremity Ankle Circles/Pumps: Both;AROM;10 reps Quad Sets: Both;AROM;10 reps Gluteal Sets: Both;AROM;10 reps Heel Slides: AAROM;Both;Limitations;10 reps Hip ABduction/ADduction: Both;AAROM;10 reps    General Comments        Pertinent Vitals/Pain Pain Assessment: (P) No/denies pain  Home Living                      Prior Function            PT Goals (current goals can now be found in the care plan section) Acute Rehab PT Goals Patient Stated Goal: None stated Time For Goal Achievement: 06/29/16 Potential to Achieve Goals: Fair Progress towards PT goals: Progressing toward goals    Frequency    Min 6X/week      PT Plan Current plan remains appropriate     Co-evaluation PT/OT/SLP Co-Evaluation/Treatment: Yes   PT goals addressed during session: Strengthening/ROM       End of Session   Activity Tolerance: Patient limited by fatigue Patient left: in bed;with call bell/phone within reach     Time:  1130- 1208    Charges:    There ex 2; there activity 1                    G CodesRayetta Humphrey, PT CLT [phone] 06/19/2016, 12:22 PM

## 2016-06-19 NOTE — Progress Notes (Signed)
Pt having difficulty with keeping legs on bed, requested bed rails up.

## 2016-06-19 NOTE — Progress Notes (Signed)
Patient voice improving throughout shift. During Neuro assessment patient verbalized that his hands and feet are numb and he cant feel anything. He also stated that he has felt this way since he has been awake. Due to difficulty speaking after extubation he was unable to verbalize. Skin is warm to touch, color is appropriate. Patient states he is not in pain. He has moderate grips in both hands but as difficulty raising arms and hands. Pt can move legs up and down and flex both feet and move toes.

## 2016-06-20 DIAGNOSIS — M549 Dorsalgia, unspecified: Secondary | ICD-10-CM

## 2016-06-20 DIAGNOSIS — G8929 Other chronic pain: Secondary | ICD-10-CM

## 2016-06-20 LAB — GLUCOSE, CAPILLARY
GLUCOSE-CAPILLARY: 170 mg/dL — AB (ref 65–99)
GLUCOSE-CAPILLARY: 115 mg/dL — AB (ref 65–99)
Glucose-Capillary: 132 mg/dL — ABNORMAL HIGH (ref 65–99)
Glucose-Capillary: 116 mg/dL — ABNORMAL HIGH (ref 65–99)
Glucose-Capillary: 98 mg/dL (ref 65–99)

## 2016-06-20 LAB — HEMOGLOBIN A1C
Hgb A1c MFr Bld: 5.7 % — ABNORMAL HIGH (ref 4.8–5.6)
Mean Plasma Glucose: 117 mg/dL

## 2016-06-20 MED ORDER — PREGABALIN 75 MG PO CAPS
75.0000 mg | ORAL_CAPSULE | Freq: Two times a day (BID) | ORAL | Status: DC
  Administered 2016-06-20 (×2): 75 mg via ORAL
  Filled 2016-06-20 (×2): qty 1

## 2016-06-20 MED ORDER — DOXYCYCLINE HYCLATE 100 MG PO TABS
100.0000 mg | ORAL_TABLET | Freq: Two times a day (BID) | ORAL | Status: DC
  Administered 2016-06-20 (×2): 100 mg via ORAL
  Filled 2016-06-20 (×2): qty 1

## 2016-06-20 MED ORDER — DIAZEPAM 5 MG PO TABS: 5.0000 mg | ORAL_TABLET | Freq: Four times a day (QID) | ORAL | Status: DC | PRN

## 2016-06-20 MED ORDER — VITAMIN B-1 100 MG PO TABS
100.0000 mg | ORAL_TABLET | Freq: Every day | ORAL | Status: DC
  Administered 2016-06-20 – 2016-06-25 (×4): 100 mg via ORAL
  Filled 2016-06-20 (×4): qty 1

## 2016-06-20 MED ORDER — ORAL CARE MOUTH RINSE
15.0000 mL | Freq: Two times a day (BID) | OROMUCOSAL | Status: DC
  Administered 2016-06-20 – 2016-06-25 (×11): 15 mL via OROMUCOSAL

## 2016-06-20 MED ORDER — TAMSULOSIN HCL 0.4 MG PO CAPS
0.4000 mg | ORAL_CAPSULE | Freq: Every day | ORAL | Status: DC
  Administered 2016-06-20: 0.4 mg via ORAL
  Filled 2016-06-20 (×2): qty 1

## 2016-06-20 MED ORDER — PANTOPRAZOLE SODIUM 40 MG PO TBEC
40.0000 mg | DELAYED_RELEASE_TABLET | Freq: Every day | ORAL | Status: DC
  Administered 2016-06-20: 40 mg via ORAL
  Filled 2016-06-20: qty 1
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
## 2016-06-20 NOTE — Progress Notes (Signed)
PHARMACIST - PHYSICIAN COMMUNICATION    CONCERNING: IV to Oral Route Change Policy  RECOMMENDATION: This patient is receiving Thiamine by the intravenous route.  Based on criteria approved by the Pharmacy and Therapeutics Committee, the intravenous medication(s) is/are being converted to the equivalent oral dose form(s).   DESCRIPTION: These criteria include:  The patient is eating (either orally or via tube) and/or has been taking other orally administered medications for a least 24 hours  The patient has no evidence of active gastrointestinal bleeding or impaired GI absorption (gastrectomy, short bowel, patient on TNA or NPO).  If you have questions about this conversion, please contact the Pharmacy Department  [x]   [phone] )  Marc Rose []   [phone] )  [facility] []   [phone] )  Marc Rose []   [phone] )  [facility] []   [phone] )  [place], Eyla Tallon [place], [facility] 06/20/2016 10:08 AM

## 2016-06-20 NOTE — Progress Notes (Addendum)
PROGRESS NOTE  Marc Rose ZOX:[id number] DOB: [birth year] DOA: 06/03/2016 PCP: [name] Patient  Brief Narrative: 65 year old man multiple medical problems presented with worsening bilateral lower extremity weakness resulting in multiple falls and traumatic skin tears on the feet and knees, also reported severe abdominal pain with fullness sensation in lower abdomen. Admitted for UTI, acute kidney injury, generalized weakness. He was evaluated by therapy who recommended skilled nursing facility. 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 He was treated with IVIG. Approximately 1 week into the hospitalization he developed acute encephalopathy with decreased mentation, was transferred to the stepdown unit and started on Precedex. He quickly decompensated and was intubated 2/7. He subsequently developed hypotension requiring vasopressor support. He was successfully extubated 2/16. He is tolerating a diet, slowly improving. Plan to pursue rehabilitation, anticipate discharge next 2 days.  Assessment/Plan 1. [name] variant of GBS, status post IVIG treatment. Profound generalized weakness.  2. Dysphagia.   Further management per speech therapy. Continue diet as ordered.  3. Diabetes mellitus type 2? Do not see that he is on any home medications for this and blood sugars have remained quite stable even on tube feeds.  Main stable. Hemoglobin A1c in process.  4. Acute hypoxic respiratory failure requiring ventilator support. Resolved. Successfully extubated 2/16. 5. Hypotension requiring vasopressor support. Resolved. Suspect autonomic dysfunction. No evidence of sepsis. Echocardiogram was reassuring. 6. Status post acute encephalopathy, toxic metabolic in nature. 7. Acute kidney injury secondary to prerenal azotemia, resolved. 8. Hyponatremia secondary to hypovolemic status. Resolved. 9. PMH multiple  lumbar/spinal surgeries, chronic lumbar discitis and osteomyelitis on suppressive therapy with doxycycline, urinary retention requiring self bladder catheterization since 2014  10. Chronic lower abdominal pain. Stable. CT abdomen and pelvis no acute finding. Protonix for esophagitis. 11. 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 Reported progressive spasticity arms and legs. 12. Peripheral arterial disease.  13. Status post enterococcus UTI associated with outpatient in/out self bladder catheterizations 14. Moderate malnutrition 15. Manubrial fracture seen on chest CT. Follow-up as an outpatient. 16. Right upper lobe nodule. Consider chest CT in 12 months. 17. Aortic atherosclerosis   Continues to make slow improvement. Respirate status is stable. Tolerating diet well. Plan to continue therapy, transfer to medical floor. Pursue rehabilitation.  Resume Lyrica for chronic pain, Valium for muscle spasm.  DVT proph: Lovenox Code Status: full code Family Communication: As above Disposition Plan: pending   [name], MD  [facility] Direct contact: [phone] --Via amion app OR  --[email or web address]; password [email or web address]  7PM-7AM contact night coverage as above 06/20/2016, 10:02 AM  LOS: 17 days   Consultants:  Neurology  Pulmonology  Procedures:  Intubation 2/7>> 2/16  Lumbar puncture 2/8  EEG IMPRESSION: This is a normal recording of the awake and drowsy states.  Echo Study Conclusions  - Left ventricle: The cavity size was normal. Wall thickness was   increased in a pattern of mild LVH. Systolic function was normal.   The estimated ejection fraction was in the range of 60% to 65%.   Doppler parameters are consistent with abnormal left ventricular   relaxation (grade 1 diastolic dysfunction). - Aortic valve: Calcified non coronary cusp. Valve area (VTI): 1.83   cm^2. Valve area  (Vmax): 1.83 cm^2. Valve area (Vmean): 1.84   cm^2. - Atrial septum: No defect or patent foramen ovale was identified. - Pulmonary arteries: PA peak pressure:  39 mm Hg (S). Antimicrobials:  None today  Interval history/Subjective: Did well yesterday, eating well, overall slowly improving.  He reports some back pain but otherwise is okay.  Objective: Vitals:   06/20/16 0600 06/20/16 0700 06/20/16 0725 06/20/16 0800  BP: (!) 144/100 (!) 148/96  (!) 137/94  Pulse: (!) 110 (!) 106 (!) 103 (!) 104  Resp: (!) 27 (!) '23 13 15  ' Temp:   98.6 F (37 C)   TempSrc:   Oral   SpO2: 94% 91% 94% 92%  Weight:      Height:        Intake/Output Summary (Last 24 hours) at 06/20/16 1002 Last data filed at 06/20/16 0211  Gross per 24 hour  Intake              770 ml  Output             1700 ml  Net             -930 ml     Filed Weights   06/16/16 0500 06/19/16 0400 06/20/16 0500  Weight: 74.9 kg (165 lb 2 oz) 71.2 kg (156 lb 15.5 oz) 71.9 kg (158 lb 8.2 oz)    Exam:    Constitutional.Appears better today. Week.  Respiratory. Remaining clear to auscultation bilaterally. No wheezes, rhonchi, rales. Normal respiratory effort.  Cardiovascular. No change. Regular rate, rhythm. No edema. No murmur, rub or gallop. Telemetry sinus rhythm.  Abdomen. Remains soft, nontender, nondistended.  Skin. Lower extremity abrasions again noted.  Musculoskeletal. Does move all extremities.  Neurologic. Speech is still slurred and somewhat difficult to understand.  Psychiatric. Quite awake and alert, appropriate and follows simple commands.   I have personally reviewed following labs and imaging studies:  Blood sugars remain stable.  Scheduled Meds: . chlorhexidine gluconate (MEDLINE KIT)  15 mL Mouth Rinse BID  . Chlorhexidine Gluconate Cloth  6 each Topical Daily  . doxycycline  100 mg Oral Q12H  . enoxaparin (LOVENOX) injection  40 mg Subcutaneous Q24H  . famotidine  20 mg Oral Daily  .  mouth rinse  15 mL Mouth Rinse BID  . pantoprazole  40 mg Oral Daily  . polyvinyl alcohol  1 drop Both Eyes TID  . pregabalin  75 mg Oral BID  . QUEtiapine  50 mg Oral QHS  . senna-docusate  1 tablet Oral QHS  . sodium chloride flush  10-40 mL Intracatheter Q12H  . tamsulosin  0.4 mg Oral QPC supper  . thiamine injection  100 mg Intravenous Daily   Continuous Infusions:   Principal Problem:   Miller-Fisher variant Guillain-Barre syndrome (HCC) Active Problems:   HTN (hypertension)   Long term current use of antibiotics   Back pain, chronic   Discitis of lumbar region   Nerve disease, peripheral (HCC)   Neurogenic urinary bladder disorder   Ataxia   Muscle weakness (generalized)   Ataxic gait   Malnutrition of moderate degree   Palliative care encounter   Goals of care, counseling/discussion   LOS: 17 days

## 2016-06-21 ENCOUNTER — Inpatient Hospital Stay (HOSPITAL_COMMUNITY): Payer: PPO

## 2016-06-21 DIAGNOSIS — R4182 Altered mental status, unspecified: Secondary | ICD-10-CM | POA: Diagnosis not present

## 2016-06-21 DIAGNOSIS — G629 Polyneuropathy, unspecified: Secondary | ICD-10-CM | POA: Diagnosis not present

## 2016-06-21 DIAGNOSIS — L8990 Pressure ulcer of unspecified site, unspecified stage: Secondary | ICD-10-CM | POA: Insufficient documentation

## 2016-06-21 DIAGNOSIS — R2689 Other abnormalities of gait and mobility: Secondary | ICD-10-CM | POA: Diagnosis not present

## 2016-06-21 DIAGNOSIS — R29810 Facial weakness: Secondary | ICD-10-CM

## 2016-06-21 LAB — BASIC METABOLIC PANEL
Anion gap: 7 (ref 5–15)
BUN: 13 mg/dL (ref 6–20)
CALCIUM: 8.5 mg/dL — AB (ref 8.9–10.3)
CHLORIDE: 99 mmol/L — AB (ref 101–111)
CO2: 30 mmol/L (ref 22–32)
CREATININE: 0.57 mg/dL — AB (ref 0.61–1.24)
GFR calc Af Amer: >60 mL/min (ref >60)
GFR calc non Af Amer: >60 mL/min (ref >60)
Glucose, Bld: 110 mg/dL — ABNORMAL HIGH (ref 65–99)
Potassium: 3.4 mmol/L — ABNORMAL LOW (ref 3.5–5.1)
SODIUM: 136 mmol/L (ref 135–145)

## 2016-06-21 LAB — BLOOD GAS, ARTERIAL
Acid-Base Excess: 5.9 mmol/L — ABNORMAL HIGH (ref 0.0–2.0)
BICARBONATE: 29.4 mmol/L — AB (ref 20.0–28.0)
Drawn by: 23534
O2 Content: 2 L/min
O2 Saturation: 94.5 %
PCO2 ART: 46.1 mmHg (ref 32.0–48.0)
PO2 ART: 74.9 mmHg — AB (ref 83.0–108.0)
pH, Arterial: 7.431 (ref 7.350–7.450)

## 2016-06-21 LAB — GLUCOSE, CAPILLARY
GLUCOSE-CAPILLARY: 112 mg/dL — AB (ref 65–99)
GLUCOSE-CAPILLARY: 97 mg/dL (ref 65–99)
Glucose-Capillary: 110 mg/dL — ABNORMAL HIGH (ref 65–99)
Glucose-Capillary: 100 mg/dL — ABNORMAL HIGH (ref 65–99)

## 2016-06-21 MED ORDER — HALOPERIDOL LACTATE 5 MG/ML IJ SOLN
5.0000 mg | Freq: Four times a day (QID) | INTRAMUSCULAR | Status: DC | PRN
  Administered 2016-06-21 – 2016-06-24 (×5): 5 mg via INTRAVENOUS
  Filled 2016-06-21 (×6): qty 1

## 2016-06-21 MED ORDER — HALOPERIDOL 2 MG PO TABS
5.0000 mg | ORAL_TABLET | Freq: Three times a day (TID) | ORAL | Status: DC
Start: 2016-06-21 — End: 2016-06-22

## 2016-06-21 NOTE — Progress Notes (Signed)
PT Cancellation Note  Patient Details Name: NASZIR RIGANO MRN: [id number] DOB: 1950-06-08   Cancelled Treatment:    Reason Eval/Treat Not Completed: Other (comment) (Pt just received haldol 30-40 min ago due to agitation and increased ataxia after MRI.  Will check back on pt tomorrow.  )   Beth Quintrell Baze, PT, DPT X: [phone]

## 2016-06-21 NOTE — Progress Notes (Signed)
Nutrition Follow-up  DOCUMENTATION CODES:   Non-severe (moderate) malnutrition in context of acute illness/injury  INTERVENTION:  - Will monitor for results of pt status - As more information comes in, will reassess pt for appropriate nutritional interventions  NUTRITION DIAGNOSIS:   Inadequate oral intake related to inability to eat as evidenced by NPO status.  Ongoing  GOAL:   Patient will meet greater than or equal to 90% of their needs  Unmet     MONITOR:   Diet advancement, Vent status, Labs, TF tolerance, I & O's  ASSESSMENT:   66 y/o male PMhx DM, PAD, HTN, Depression/Anxiety, Tobacco abuse, urinary retention. Originally had presented due to BLE weakness that had resulted in falls and traumatic skin lesions. Worked up for UTI, AKI. 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  Mental status worsened and had difficulty protecting airway. Intubated 2/7. Pt was successfully extubated 2/16 and is tolerating a diet, slowly improving.   Pt was extubated, estimated nutritional needs reassessed.   Per chart review, pt's meal completion records show completion of 15-60% of meals. Pt is currently NPO, pending further evaluation.  Per chart review, pt has experienced weight loss of 11 lb in 1 mo (6% weight loss in 1 mo - significant for time frame)  Pt is at risk of worsening malnutrition status and has increased needs related to his condition, will monitor for changes in status and adjust interventions as necessary.  Labs reviewed; CBG (97-170), K (3.4) Medications reviewed; 20 mg Pepcid, 40 mg Protonix, Senokot-S, 100 mg Vitamin B-1  Diet Order:  Diet NPO time specified  Skin:  Wound (see comment) (Stage II Pressure Injury to sacrum)  Last BM:  2/16  Height:   Ht Readings from Last 1 Encounters:  06/16/16 6\' 2"  (1.88 m)    Weight:   Wt Readings from Last 1 Encounters:  06/21/16 154 lb 1.6 oz (69.9 kg)    Ideal  Body Weight:  86.36 kg  BMI:  Body mass index is 19.79 kg/m.  Estimated Nutritional Needs:   Kcal:  1750-1950 (25-28 kcal/kg)  Protein:  95-105 (1.35-1.5 g/kg)  Fluid:  >/= 2 L/d  EDUCATION NEEDS:   No education needs identified at this time  [employer] Dietetic Intern

## 2016-06-21 NOTE — Plan of Care (Signed)
Problem: Skin Integrity: Goal: Risk for impaired skin integrity will decrease Outcome: Progressing Patient has pillows around bed to help protect his skin from injury.

## 2016-06-21 NOTE — Progress Notes (Addendum)
[name] [name], MD     [email or web address]          Marc Rose is an 66 y.o. male.   ASSESSMENT/PLAN:      Resolving Severe delirium/encephalopathy of unclear etiology at this time. We are suspecting that this may be multifactorial including UTI and medication effect from Levaquin. Levaquin has been discontinued. The picture seems most consistent with toxic metabolic encephalopathy/delirium as opposed to primary and CNS problem. The patient's spinal fluid analysis shows the typical formula with elevated protein and essentially unremarkable white cell count (cytoalbuminologic dissociation) seen in demyelinating conditions such as Guillain-Barr syndrome. The CSF does not show evidence of central nervous system infection.  I recommend that agitation be treated with when necessary doses of Haldol. I will increase his maintenance dose of Haldol. Will continue with the Seroquel. Around the clock haldol restarted.    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 Completed 5 day course of IV IG. PT and OT. Usual course worsens for 4 weeks (likely at nadir of deterioration now) and then gradually improves over 3-4 months.  L facial weakness and dysarthria also due to Guillain-Barr syndrome    Low back pain status post L1-L2 laminectomy. HX of chronic discitis      Extubated over weekend but agitated today      GENERAL: He is extubated.   HEENT: Supple. Atraumatic normocephalic. Dense cataracts both sides.  ABDOMEN: soft  EXTREMITIES: No edema. Multiple abrasions and lacerations involving the knees and legs.   BACK: Normal.  SKIN: Normal by inspection.    MENTAL STATUS: He opens his eyes to verbal commands. He does gestures yes and no shaking his head affirmatively. He does follow midline and appendicular commands.  CRANIAL NERVES: Pupils  are mid position and reactive. He seems to have R lateral rectus paresis; Facial muscles are plegic on L but normal R.    MOTOR: Moves both sides arms 3-4/5; legs 3/5  COORDINATION: Very ataxic - trunk and appendiular  REFLEXES: The patient is areflexic in the legs and upper extremities.    EEG normal  Blood pressure 140/85, pulse (!) 112, temperature 98 F (36.7 C), temperature source Axillary, resp. rate (!) 25, height '6\' 2"'  (1.88 m), weight 154 lb 1.6 oz (69.9 kg), SpO2 94 %.  Past Medical History:  Diagnosis Date  . Anxiety state, unspecified   . Arthritis   . 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 Benign neoplasm of colon   . Causalgia of upper limb    Left limb  . Chronic back pain   . Chronic leg pain   . Depression   . Diabetes mellitus   . Diskitis 02/05/2015  . Drug rash 11/12/2015  . Elevated blood pressure reading without diagnosis of hypertension   . Essential hypertension, benign   . Hardware complicating wound infection ([place]) 02/05/2015  . Hyperpigmentation 05/15/2015  . Hypertension   . Impaired fasting glucose   . Peripheral arterial disease ([place])   . Pure hypercholesterolemia   . Tobacco use disorder   . Unspecified retinal detachment     Past Surgical History:  Procedure Laterality Date  . ANKLE SURGERY     left  . APPENDECTOMY    . BACK SURGERY    . COLONOSCOPY    . KNEE SURGERY     left    Family History  Problem Relation Age of Onset  . COPD Mother   .  Hyperthyroidism Mother   . Hyperthyroidism Sister     Social History:  reports that he has been smoking Cigarettes.  He has a 19.50 pack-year smoking history. He has never used smokeless tobacco. He reports that he uses drugs, including Marijuana. He reports that he does not drink alcohol.  Allergies:  Allergies  Allergen Reactions  . Codeine Anaphylaxis, Hives and Swelling  . Penicillins Anaphylaxis, Hives and Swelling  . Latex Itching  . Strawberry Extract Hives    Medications: Prior to Admission  medications   Medication Sig Start Date End Date Taking? Authorizing Provider  aspirin EC 81 MG tablet Take 81 mg by mouth daily.   Yes Historical Provider, MD  benazepril (LOTENSIN) 40 MG tablet Take 40 mg by mouth daily.   Yes Historical Provider, MD  cyclobenzaprine (FLEXERIL) 10 MG tablet Take 1 tablet (10 mg total) by mouth 3 (three) times daily as needed for muscle spasms. 09/19/13  Yes Adeline Saralyn Pilar, MD  diazepam (VALIUM) 10 MG tablet Take 0.5 tablets (5 mg total) by mouth every 6 (six) hours as needed for anxiety (back spasms). Patient taking differently: Take 10 mg by mouth 4 (four) times daily as needed for anxiety (back spasms).  09/19/13  Yes Adeline Saralyn Pilar, MD  doxycycline (VIBRA-TABS) 100 MG tablet Take 1 tablet (100 mg total) by mouth 2 (two) times daily. 11/27/15  Yes Truman Hayward, MD  fluticasone [facility]) 50 MCG/ACT nasal spray Place 1 spray into the nose daily as needed for allergies.  07/04/14  Yes Historical Provider, MD  NEOMYCIN-POLYMYXIN-HYDROCORTISONE (CORTISPORIN) 1 % SOLN otic solution Place 2 drops in ear(s) daily. Reported on 11/12/2015 07/04/14  Yes Historical Provider, MD  pregabalin (LYRICA) 75 MG capsule Take 75 mg by mouth 2 (two) times daily.  05/07/13  Yes Historical Provider, MD  tamsulosin (FLOMAX) 0.4 MG CAPS capsule Take 0.4 mg by mouth.   Yes Historical Provider, MD    Scheduled Meds: . chlorhexidine gluconate (MEDLINE KIT)  15 mL Mouth Rinse BID  . Chlorhexidine Gluconate Cloth  6 each Topical Daily  . doxycycline  100 mg Oral Q12H  . enoxaparin (LOVENOX) injection  40 mg Subcutaneous Q24H  . famotidine  20 mg Oral Daily  . mouth rinse  15 mL Mouth Rinse BID  . pantoprazole  40 mg Oral Daily  . polyvinyl alcohol  1 drop Both Eyes TID  . pregabalin  75 mg Oral BID  . QUEtiapine  50 mg Oral QHS  . senna-docusate  1 tablet Oral QHS  . sodium chloride flush  10-40 mL Intracatheter Q12H  . tamsulosin  0.4 mg Oral QPC supper  . thiamine  100 mg Oral  Daily   Continuous Infusions:  PRN Meds:.acetaminophen **OR** acetaminophen, bisacodyl, diazepam, haloperidol lactate, magnesium hydroxide, ondansetron **OR** ondansetron (ZOFRAN) IV, sodium chloride flush     Results for orders placed or performed during the hospital encounter of 06/03/16 (from the past 48 hour(s))  Glucose, capillary     Status: Abnormal   Collection Time: 06/19/16  7:47 PM  Result Value Ref Range   Glucose-Capillary 146 (H) 65 - 99 mg/dL   Comment 1 Notify RN   Glucose, capillary     Status: Abnormal   Collection Time: 06/19/16 11:54 PM  Result Value Ref Range   Glucose-Capillary 113 (H) 65 - 99 mg/dL   Comment 1 Notify RN   Glucose, capillary     Status: Abnormal   Collection Time: 06/20/16  4:13 AM  Result Value Ref Range   Glucose-Capillary 170 (H) 65 - 99 mg/dL   Comment 1 Notify RN   Glucose, capillary     Status: Abnormal   Collection Time: 06/20/16  7:24 AM  Result Value Ref Range   Glucose-Capillary 132 (H) 65 - 99 mg/dL  Glucose, capillary     Status: Abnormal   Collection Time: 06/20/16 11:43 AM  Result Value Ref Range   Glucose-Capillary 115 (H) 65 - 99 mg/dL  Glucose, capillary     Status: Abnormal   Collection Time: 06/20/16  6:24 PM  Result Value Ref Range   Glucose-Capillary 116 (H) 65 - 99 mg/dL  Glucose, capillary     Status: None   Collection Time: 06/20/16  9:46 PM  Result Value Ref Range   Glucose-Capillary 98 65 - 99 mg/dL   Comment 1 Notify RN   Basic metabolic panel     Status: Abnormal   Collection Time: 06/21/16  4:31 AM  Result Value Ref Range   Sodium 136 135 - 145 mmol/L   Potassium 3.4 (L) 3.5 - 5.1 mmol/L   Chloride 99 (L) 101 - 111 mmol/L   CO2 30 22 - 32 mmol/L   Glucose, Bld 110 (H) 65 - 99 mg/dL   BUN 13 6 - 20 mg/dL   Creatinine, Ser 0.57 (L) 0.61 - 1.24 mg/dL   Calcium 8.5 (L) 8.9 - 10.3 mg/dL   GFR calc non Af Amer >60 >60 mL/min   GFR calc Af Amer >60 >60 mL/min    Comment: (NOTE) The eGFR has been  calculated using the CKD EPI equation. This calculation has not been validated in all clinical situations. eGFR's persistently <60 mL/min signify possible Chronic Kidney Disease.    Anion gap 7 5 - 15  Glucose, capillary     Status: None   Collection Time: 06/21/16  7:28 AM  Result Value Ref Range   Glucose-Capillary 97 65 - 99 mg/dL  Blood gas, arterial     Status: Abnormal   Collection Time: 06/21/16  9:00 AM  Result Value Ref Range   O2 Content 2.0 L/min   Delivery systems NASAL CANNULA    pH, Arterial 7.431 7.350 - 7.450   pCO2 arterial 46.1 32.0 - 48.0 mmHg   pO2, Arterial 74.9 (L) 83.0 - 108.0 mmHg   Bicarbonate 29.4 (H) 20.0 - 28.0 mmol/L   Acid-Base Excess 5.9 (H) 0.0 - 2.0 mmol/L   O2 Saturation 94.5 %   Collection site LEFT RADIAL    Drawn by [phone]    Sample type ARTERIAL    Allens test (pass/fail) PASS PASS  Glucose, capillary     Status: Abnormal   Collection Time: 06/21/16 12:18 PM  Result Value Ref Range   Glucose-Capillary 110 (H) 65 - 99 mg/dL    Studies/Results:  CTA LUNG IMPRESSION: Manubrial fracture  No evidence of pulmonary emboli.  Left lower lobe infiltrate and effusion.   REPEAT MRI/MRA 06-21-16 FINDINGS: MRI HEAD FINDINGS  Axial and coronal T2 sequences are severely motion degraded. There is mild motion artifact on other sequences.  Brain: There is no evidence of acute infarct, intracranial hemorrhage, mass, midline shift, or extra-axial fluid collection. Mild generalized cerebral atrophy is within normal limits for age. No significant cerebral white matter disease is seen for age.  Vascular: Major intracranial vascular flow voids not well assessed due to motion.  Skull and upper cervical spine: No gross osseous lesion.  Sinuses/Orbits: No gross abnormality.  Other: None.  MRA HEAD FINDINGS  The visualized distal vertebral arteries are widely patent and codominant. The basilar artery is widely patent.  Posterior communicating arteries are diminutive or absent. PCAs are patent without evidence of significant stenosis.  Internal carotid arteries are patent from skullbase to carotid termini without evidence of significant stenosis. ACAs and MCAs are patent without evidence of major branch occlusion or significant proximal stenosis allowing for mild motion artifact. No intracranial aneurysm is identified.  IMPRESSION: 1. Motion degraded examination without evidence of acute intracranial abnormality. 2. Negative head MRA.            BRAIN MRI FINDINGS: Brain: Cerebral volume is within normal limits for age. No restricted diffusion to suggest acute infarction. No midline shift, mass effect, evidence of mass lesion, ventriculomegaly, extra-axial collection or acute intracranial hemorrhage. Cervicomedullary junction and pituitary are within normal limits.  Pearline Cables and white matter signal is within normal limits for age throughout the brain. No cortical encephalomalacia or chronic cerebral blood products.  Vascular: Major intracranial vascular flow voids are preserved.  Skull and upper cervical spine: Negative. Normal bone marrow signal.  Sinuses/Orbits: Normal orbits soft tissues. Trace paranasal sinus mucosal thickening.  Other: Visible internal auditory structures appear normal. Mastoids are clear. Negative scalp soft tissues.  IMPRESSION: No acute intracranial abnormality. Normal for age noncontrast MRI appearance of the brain.            L SPINE MRI 2015 FINDINGS: Trace anterolisthesis of L3 on L4 and L4 on L5 is unchanged. There is grade 1 retrolisthesis of L1 on L2, stable to minimally increased from prior MRI. New from the prior MRI is a mild compression deformity involving the L2 superior endplate with approximately 10% vertebral body height loss. There is abnormal fluid signal within the L1-2 disc space, and there is also new edema  throughout the L1 and L2 vertebral bodies. There is paraspinal inflammatory change/ phlegmon at this level, and there is a 1.9 x 1.3 cm fluid collection in the left psoas muscle at the L2 level (series 5, image 16).  Advanced disc space height loss at L5-S1 is unchanged, however there is increased fluid signal within the disc space compared to the prior MRI with mildly increased marrow edema within the adjacent L5 and S1 vertebral bodies. The conus medullaris is normal in signal and terminates at the superior aspect of L2.  T11-12: Only imaged sagittally. Facet hypertrophy results in mild bilateral neural foraminal narrowing, right greater than left and unchanged.  T12-L1:  Negative.  L1-2: Sequelae of prior laminectomy and posterior fusion are again identified. Posterior retropulsion of the superior L2 endplate, greater on the right, with possibly small adjacent epidural fluid collection, results in new narrowing of the right lateral recess and increased right neural foraminal narrowing. No spinal canal stenosis.  L2-3: Mild disc bulge and facet hypertrophy result in mild right lateral recess and neural foraminal narrowing, unchanged.  L3-4: Mild disc bulge and moderate facet hypertrophy result in mild right neural foraminal narrowing, unchanged. No spinal canal stenosis.  L4-5: Left foraminal disc protrusion and mild-to-moderate facet hypertrophy result in mild to moderate left neural foraminal stenosis, unchanged. No spinal canal stenosis.  L5-S1: Disc bulge and mild facet hypertrophy result in mild to moderate bilateral neural foraminal stenosis, unchanged. No spinal canal stenosis.  IMPRESSION: 1. Changes at L1-2 as above concerning for discitis/osteomyelitis with small left psoas abscess. New, mild compression deformity of the L2 superior endplate and mild retropulsion results in new right lateral recess narrowing  and increased right neural  foraminal narrowing at L1-2. Small ventral epidural fluid collection is not excluded on the right. 2. Advanced degenerative disc disease at L5-S1 with a small amount of new fluid signal in the disc space and mildly increased adjacent marrow changes. Early discitis/osteomyelitis is not excluded.       Marcel Gary A. [name], M.D.  Diplomate, Tax adviser of Psychiatry and Neurology ( Neurology). 06/21/2016, 5:44 PM

## 2016-06-21 NOTE — Progress Notes (Signed)
I had planned to follow more peripherally and perhaps sign off today but when I went by his nurse told me that he was worse today with more confusion and hallucinations. He does appear clinically worse. He is oxygenating well but I think we should check blood gas and check a chest x-ray because he is high risk for aspiration

## 2016-06-21 NOTE — Progress Notes (Signed)
MD notified of increased bp. No new orders

## 2016-06-21 NOTE — Progress Notes (Addendum)
Speech Language Pathology Treatment: Dysphagia  Patient Details Name: Marc Rose MRN: [id number] DOB: 02/19/1951 Today's Date: 06/21/2016 Time: 1030-1100 SLP Time Calculation (min) (ACUTE ONLY): 30 min  Assessment / Plan / Recommendation Clinical Impression  Chart reviewed and spoke with RN; Pt demonstrated decreased toleration of diet starting Saturday PM (coughing with liquids). He has been made NPO and repeat MRI ordered for later today. Pt seen at bedside for ongoing diagnostic dysphagia intervention. Pt with decreased alertness, but does rouse to voice and oral care. White coating along tongue noted, but removed with toothbrush and suction. Pt with definite left facial droop and ptosis with left eye (this was present on Saturday as well); lingual strength and ROM appear WNL. Facial nerve palsy can be seen in GBS/Miller Fisher syndrome, will d/w Dr. Merlene Rose (neurology). Sensory testing difficult to complete due to decreased Pt mentation. Pt appears more confused today, as evidenced by attempting to self feed without cup in hand and spitting at times. He accepted a few ice chips, sips of water, and bites of puree and demonstrated one episode of immediate coughing following his first teaspoon presentation of water. MBSS completed on Saturday showed NO penetration/aspiration of thin liquids over several trials, but did show aspiration of nectar-thick liquids (silent and when using straw). Will continue NPO for now pending results of MRI and SLP will see again this afternoon. Lethargy is a limiting factor today. Addendum: SLP called and spoke with Marc Rose (listed as a contact on face sheet) and she confirmed that Pt did NOT have any facial asymmetry prior to admission. She reported that his speech was "a little slurred" when not wearing his dentures. Pt also reports that Pt has a dog Marc Rose) and a cat Marc Rose) and that a friend Marc Rose and Marc Rose) are looking after his animals. She says that  his animal are very important to him. She describes "Marc Rose" as an active person who does not like to be in bed. He can also be "pessimistic", but has a "good heart". 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 She asked SLP to tell Pt that his animals are okay and that Marc Rose has his debit card, cell phone, and is looking in on his animals three times a day. This was relayed to Marc Rose and he stated, "Whatever" to SLP.    HPI HPI: 66 year old man with multiple lumbar/spinal surgeries, history of lumbar discitis and osteomyelitis-on chronic oral doxycycline, urinary retention requiring self bladder catheterizations since 2014, chronic lower abdominal pain, and HTN, who presented to the ED on 06/03/16 for lower abdominal pain and generalized weakness. He has had multiple surgeries on his left lower extremity. Patient has been on chronic doxycycline for chronic discitis and osteomyelitis since 2015. Patient has a urine analysis positive for infection from January 26 with culture positive for Enterococcus faecalis. Dx: UTI, mild lactic acidosis without sepsis, 3mm R upper lung nodule, acute encephalopathy with mental status significantly declining on 06/09/2016 possibly due to UTI or adverse effect of Levaquin. Hypovolemic shock. Acute respiratory failure and intubation for airway protection. 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 Approximately 1 week into the hospitalization he developed acute encephalopathy with decreased mentation, was transferred to the stepdown unit and started on Precedex. He quickly decompensated and was intubated 2/7. He subsequently developed hypotension requiring vasopressor support. SLP to administer BSE and determine LRD.      SLP Plan  Continue with current plan of care       Recommendations  Diet recommendations: NPO (pending results of MRI and SLP f/u again  later today)                Oral Care Recommendations: Oral care QID;Staff/trained caregiver to provide oral care Follow up Recommendations: Skilled Nursing facility SLP Visit Diagnosis: Dysphagia, oropharyngeal phase (R13.12) Plan: Continue with current plan of care       Thank you,  Marc Rose, Lucas                 [place] 06/21/2016, 11:32 AM

## 2016-06-21 NOTE — Progress Notes (Signed)
Patient taken down for MRI via stretcher.

## 2016-06-21 NOTE — Progress Notes (Signed)
PROGRESS NOTE  Marc Rose QMV:[id number] DOB: 1950-07-09 DOA: 06/03/2016 PCP: [name] Patient  Brief Narrative: 66 year old man multiple medical problems presented with worsening bilateral lower extremity weakness resulting in multiple falls and traumatic skin tears on the feet and knees, also reported severe abdominal pain with fullness sensation in lower abdomen. Admitted for UTI, acute kidney injury, generalized weakness. He was evaluated by therapy who recommended skilled nursing facility. 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 He was treated with IVIG. Approximately 1 week into the hospitalization he developed acute encephalopathy with decreased mentation, was transferred to the stepdown unit and started on Precedex. He quickly decompensated and was intubated 2/7. He subsequently developed hypotension requiring vasopressor support. He was successfully extubated 2/16. He is tolerating a diet, slowly improving. Plan to pursue rehabilitation, anticipate discharge next 2 days.  Assessment/Plan 1. Left-sided facial weakness, decreased mental status. He did appear to have left-sided weakness throughout this week, appearance now may be magnified by removal of beard. Bedside RN Evelena Peat also thinks facial weakness was present previously. However given change in mental status, concern for possibility of stroke although not highly favored. Last seen normal unknown, sometime last afternoon or evening, greater than 12 hours, therefore not a candidate for tPA.  Nothing by mouth pending further evaluation. Check MRI, MRA brain pending results of ABG and chest x-ray. His respiratory status does appear to be stable at this point.  Follow-up neurology recommendations.  2. Marc Rose variant of GBS, status post IVIG treatment. Profound generalized weakness.  3. Dysphagia.   Nothing by mouth for now pending further  evaluation. Appreciate speech therapy intervention.  4. Acute hypoxic respiratory failure requiring ventilator support. Resolved. Successfully extubated 2/16. 5. Hypotension requiring vasopressor support. Resolved. Suspect autonomic dysfunction. No evidence of sepsis. Echocardiogram was reassuring. 6. Acute kidney injury secondary to prerenal azotemia, resolved. 7. Hyponatremia secondary to hypovolemic status. Resolved. 8. PMH multiple lumbar/spinal surgeries, chronic lumbar discitis and osteomyelitis on suppressive therapy with doxycycline, urinary retention requiring self bladder catheterization since 2014  9. Chronic lower abdominal pain. Stable. CT abdomen and pelvis no acute finding. Protonix for esophagitis. 10. 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 Reported progressive spasticity arms and legs. 11. Peripheral arterial disease.  12. Status post enterococcus UTI associated with outpatient in/out self bladder catheterizations 13. Moderate malnutrition 14. Manubrial fracture seen on chest CT. Discussed with thoracic surgery Dr. Servando Snare. Recommends activity as tolerated, no heavy lifting, follow-up chest x-ray with sternal view as an outpatient. No other follow-up recommended at this point. 15. Right upper lobe nodule. Consider chest CT in 12 months. 16. Aortic atherosclerosis 17. Prediabetes. Blood sugar stable. Discontinue sliding scale insulin.     DVT proph: Lovenox Code Status: full code Family Communication: As above Disposition Plan: pending   Marc Hodgkins, MD  [facility] Direct contact: [phone] --Via amion app OR  --[email or web address]; password [email or web address]  7PM-7AM contact night coverage as above 06/21/2016, 9:05 AM  LOS: 18 days   Consultants:  Neurology  Pulmonology  Procedures:  Intubation 2/7>> 2/16  Lumbar puncture 2/8  EEG IMPRESSION: This is a normal  recording of the awake and drowsy states.  Echo Study Conclusions  - Left ventricle: The cavity size was normal. Wall thickness was   increased in a pattern of mild LVH. Systolic function was normal.   The estimated ejection fraction was in the  range of 60% to 65%.   Doppler parameters are consistent with abnormal left ventricular   relaxation (grade 1 diastolic dysfunction). - Aortic valve: Calcified non coronary cusp. Valve area (VTI): 1.83   cm^2. Valve area (Vmax): 1.83 cm^2. Valve area (Vmean): 1.84   cm^2. - Atrial septum: No defect or patent foramen ovale was identified. - Pulmonary arteries: PA peak pressure: 39 mm Hg (S). Antimicrobials:  None today  Interval history/Subjective: Nursing reports mental status change overnight. More confused, hallucinating, more somnolent. Concern for aspiration.  The patient does awaken and answer simple questions, denies pain or other complaints.  Objective: Vitals:   06/21/16 0048 06/21/16 0400 06/21/16 0600 06/21/16 0729  BP:   (!) 150/100   Pulse:   (!) 119 (!) 112  Resp:      Temp: 98.4 F (36.9 C)  98 F (36.7 C) 98.4 F (36.9 C)  TempSrc: Axillary  Axillary Axillary  SpO2:   96% 94%  Weight:  69.9 kg (154 lb 1.6 oz)    Height:        Intake/Output Summary (Last 24 hours) at 06/21/16 0905 Last data filed at 06/21/16 0400  Gross per 24 hour  Intake                0 ml  Output             2100 ml  Net            -2100 ml     Filed Weights   06/19/16 0400 06/20/16 0500 06/21/16 0400  Weight: 71.2 kg (156 lb 15.5 oz) 71.9 kg (158 lb 8.2 oz) 69.9 kg (154 lb 1.6 oz)    Exam:    Constitutional.Appears worse today. He does awaken to voice and follows simple commands. Dysarthric speech is about the same but he clearly looks worse and is less alert.  Eyes. Pupils are equal, round, react to light. Lids and irises appear unremarkable.  ENT. Lips appear unremarkable. Hearing grossly intact.. It has been shaved. There is  left facial weakness which appears worse than previously seen.  Cardiovascular. Regular rate and rhythm. No murmur, rub or gallop. No lower extremity edema. Telemetry sinus tachycardia.  Respiratory. Clear to auscultation bilaterally, shallow breathing, decreased breath sounds. No frank wheezes, rales or rhonchi. Respiratory effort minimally increased.  Abdomen soft  Musculoskeletal. Moves all extremities to command. Movements are ataxic. No clear references strength or tone.  Psychiatric. He is alert, oriented to self and the president. As above he follows simple commands. Not as alert as yesterday.  Skin. No change. Bilateral lower extremity abrasions.   I have personally reviewed following labs and imaging studies:  Potassium 3.4 remainder BMP unremarkable.  Hemoglobin A1c 5.7  Blood sugars stable  Scheduled Meds: . chlorhexidine gluconate (MEDLINE KIT)  15 mL Mouth Rinse BID  . Chlorhexidine Gluconate Cloth  6 each Topical Daily  . doxycycline  100 mg Oral Q12H  . enoxaparin (LOVENOX) injection  40 mg Subcutaneous Q24H  . famotidine  20 mg Oral Daily  . mouth rinse  15 mL Mouth Rinse BID  . pantoprazole  40 mg Oral Daily  . polyvinyl alcohol  1 drop Both Eyes TID  . pregabalin  75 mg Oral BID  . QUEtiapine  50 mg Oral QHS  . senna-docusate  1 tablet Oral QHS  . sodium chloride flush  10-40 mL Intracatheter Q12H  . tamsulosin  0.4 mg Oral QPC supper  . thiamine  100 mg  Oral Daily   Continuous Infusions:   Principal Problem:   Miller-Fisher variant Guillain-Barre syndrome (HCC) Active Problems:   HTN (hypertension)   Long term current use of antibiotics   Back pain, chronic   Discitis of lumbar region   Nerve disease, peripheral (HCC)   Neurogenic urinary bladder disorder   Ataxia   Muscle weakness (generalized)   Ataxic gait   Malnutrition of moderate degree   Palliative care encounter   Goals of care, counseling/discussion   Pressure injury of skin    LOS: 18 days

## 2016-06-22 DIAGNOSIS — R4182 Altered mental status, unspecified: Secondary | ICD-10-CM | POA: Diagnosis not present

## 2016-06-22 DIAGNOSIS — G629 Polyneuropathy, unspecified: Secondary | ICD-10-CM | POA: Diagnosis not present

## 2016-06-22 DIAGNOSIS — R2689 Other abnormalities of gait and mobility: Secondary | ICD-10-CM | POA: Diagnosis not present

## 2016-06-22 LAB — BASIC METABOLIC PANEL
Anion gap: 8 (ref 5–15)
BUN: 16 mg/dL (ref 6–20)
CALCIUM: 8.7 mg/dL — AB (ref 8.9–10.3)
CO2: 31 mmol/L (ref 22–32)
CREATININE: 0.65 mg/dL (ref 0.61–1.24)
Chloride: 101 mmol/L (ref 101–111)
GFR calc Af Amer: >60 mL/min (ref >60)
GLUCOSE: 141 mg/dL — AB (ref 65–99)
Potassium: 3.3 mmol/L — ABNORMAL LOW (ref 3.5–5.1)
Sodium: 140 mmol/L (ref 135–145)

## 2016-06-22 LAB — GLUCOSE, CAPILLARY
GLUCOSE-CAPILLARY: 99 mg/dL (ref 65–99)
GLUCOSE-CAPILLARY: 103 mg/dL — AB (ref 65–99)
Glucose-Capillary: 103 mg/dL — ABNORMAL HIGH (ref 65–99)
Glucose-Capillary: 135 mg/dL — ABNORMAL HIGH (ref 65–99)
Glucose-Capillary: 95 mg/dL (ref 65–99)

## 2016-06-22 LAB — MAGNESIUM: Magnesium: 1.9 mg/dL (ref 1.7–2.4)

## 2016-06-22 LAB — PHOSPHORUS: Phosphorus: 3.1 mg/dL (ref 2.5–4.6)

## 2016-06-22 MED ORDER — POTASSIUM CHLORIDE IN NACL 40-0.9 MEQ/L-% IV SOLN
INTRAVENOUS | Status: DC
  Administered 2016-06-22 – 2016-06-25 (×6): 125 mL/h via INTRAVENOUS

## 2016-06-22 MED ORDER — DOXYCYCLINE HYCLATE 100 MG IV SOLR
100.0000 mg | Freq: Two times a day (BID) | INTRAVENOUS | Status: DC
  Administered 2016-06-22 – 2016-06-25 (×7): 100 mg via INTRAVENOUS
  Filled 2016-06-22 (×13): qty 100

## 2016-06-22 MED ORDER — LORAZEPAM 2 MG/ML IJ SOLN
1.0000 mg | Freq: Four times a day (QID) | INTRAMUSCULAR | Status: DC | PRN
  Administered 2016-06-23 (×2): 1 mg via INTRAVENOUS
  Filled 2016-06-22 (×2): qty 1

## 2016-06-22 MED ORDER — PANTOPRAZOLE SODIUM 40 MG IV SOLR
40.0000 mg | INTRAVENOUS | Status: DC
  Administered 2016-06-22 – 2016-06-25 (×4): 40 mg via INTRAVENOUS
  Filled 2016-06-22 (×4): qty 40

## 2016-06-22 MED ORDER — SODIUM CHLORIDE 0.9 % IV SOLN
INTRAVENOUS | Status: DC
  Administered 2016-06-22: 13:00:00 via INTRAVENOUS

## 2016-06-22 NOTE — Progress Notes (Signed)
Occupational Therapy Treatment Patient Details Name: Marc Rose MRN: [id number] DOB: 1950/05/29 Today's Date: 06/22/2016    History of present illness 66 year old man with multiple lumbar/spinal surgeries, history of lumbar discitis and osteomyelitis-on chronic oral doxycycline, urinary retention requiring self bladder catheterizations since 2014, chronic lower abdominal pain, and HTN, who presented to the ED on 06/03/16 for lower abdominal pain and generalized weakness. He has had multiple surgeries on his left lower extremity. Patient has been on chronic doxycycline for chronic discitis and osteomyelitis since 2015. Patient has a urine analysis positive for infection from January 26 with culture positive for Enterococcus faecalis.  Dx: UTI, mild lactic acidosis without sepsis, 31mm R upper lung nodule, acute encephalopathy with mental status significantly declining on 06/09/2016 possibly due to UTI or adverse effect of Levaquin.  Hypovolemic shock.  Acute respiratory failure and intubation for airway protection.  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 Completed 5 day course of IV IG.  MRI 06/21/16 was (-) for acute changes.    OT comments  Patient with less responsiveness and ability to communicate this date versus previous sessions. OT session focused on grooming task and attempting patient to participate in task, sitting balance while engaging in simple commands, and UB ROM. Pt continues to have limited response to following commands. Verbal, physical, and tactile cues used for all activities this date.    Follow Up Recommendations  LTACH;SNF    Equipment Recommendations  None recommended by OT       Precautions / Restrictions Precautions Precautions: Fall (Simultaneous filing. User may not have seen previous data.) Precaution Comments: 100 in the past 6 months.  L knee is popping in  and out.  Pt is very ataxic  (Simultaneous filing. User may not have seen previous data.) Restrictions Weight Bearing Restrictions: No (Simultaneous filing. User may not have seen previous data.) Other Position/Activity Restrictions: PEG tube, B mitts today       Mobility Bed Mobility Overal bed mobility: Needs Assistance Bed Mobility: Supine to Sit;Sit to Supine;Rolling Rolling: Max assist;+2 for safety/equipment;+2 for physical assistance   Supine to sit: Max assist;HOB elevated;+2 for physical assistance;+2 for safety/equipment Sit to supine: +2 for physical assistance;+2 for safety/equipment   General bed mobility comments: Pt continues to demonstrate poor trunk control, therefore requiring increased assistance for all functional mobiltiy tasks.  Pt was only able to sit on the EOB for 1-2 minutes today, and then was assisted back into the bed due to pt strongly pushing posteriorly and extending legs out, placing him at risk to fall off the EOB.  Pt was not able to follow commands to place feet back on the floor, and therapist unable to assist them back on the floor.    Transfers                      Balance Overall balance assessment: Needs assistance Sitting-balance support: Bilateral upper extremity supported;Feet supported Sitting balance-Leahy Scale: Poor Sitting balance - Comments: Max A +2 for static sitting balance pt is able to attempt to lift head up, and perform upright sitting posture, but requires assistance to do so. Pt continues to have ataxia in all 4 extremities.  Unable to keep pt sitting on EOB due to strong posterior lean with pt lifting B LE's up in the air, and unable to place them back on the floor.  ADL Overall ADL's : Needs assistance/impaired     Grooming: Wash/dry face;Total assistance;Bed level (HOB elevated) Grooming Details (indicate cue type and reason): Therapist providing hand over hand assist. Minimal  attempt from patient to participate in task. Pt was able to assist with completing elbow flexion in order to bring washcloth to face.             Lower Body Dressing: Total assistance;Bed level Lower Body Dressing Details (indicate cue type and reason): donning socks                                Cognition   Behavior During Therapy: Restless Overall Cognitive Status: Difficult to assess                  General Comments: Pt is more lethargic today compared to previous treatments.  Not able to speak today - only mouthing words, but unable to make out what he is trying to state.      Exercises General Exercises - Upper Extremity Elbow Flexion: PROM;Both;10 reps;Supine Elbow Extension: PROM;10 reps;Both;Supine           Pertinent Vitals/ Pain       Pain Assessment: No/denies pain      Progress Toward Goals  OT Goals(current goals can now be found in the care plan section)  Progress towards OT goals: Progressing toward goals     Plan Discharge plan remains appropriate;Frequency remains appropriate    Co-evaluation    PT/OT/SLP Co-Evaluation/Treatment: Yes Reason for Co-Treatment: Complexity of the patient's impairments (multi-system involvement)   OT goals addressed during session: Strengthening/ROM;ADL's and self-care      End of Session    OT Visit Diagnosis: Muscle weakness (generalized) (M62.81)   Activity Tolerance Patient tolerated treatment well;Patient limited by fatigue   Patient Left in bed;with call bell/phone within reach;with bed alarm set   Nurse Communication Other (comment) (Patient's performance)        Time: [id number] OT Time Calculation (min): 30 min  Charges: OT General Charges $OT Visit: 1 Procedure OT Treatments $Therapeutic Activity: 23-37 mins    Ailene Ravel, OTR/L,CBIS  [phone]  06/22/2016, 10:32 AM

## 2016-06-22 NOTE — Progress Notes (Signed)
  Speech Language Pathology Treatment: Dysphagia  Patient Details Name: Marc Rose MRN: [id number] DOB: 1950/12/12 Today's Date: 06/22/2016 Time: [id number] SLP Time Calculation (min) (ACUTE ONLY): 22 min  Assessment / Plan / Recommendation Clinical Impression  Unfortunately, pt continues to be encephalopathic and unable to maintain alertness for po intake. Today, he had increased difficulty communicating and was noted to be mostly aphonic with communication attempts. Vocalizations heard during coughing only. He whispered the name of his cat Marc Rose) when asked. Pt did attempt to follow some commands, however given severity of ataxia, this was difficult. He was unable to close his left eye and did not respond to visual threat (but did so readily with right eye). Pt did allow oral care. Ice chips presented and Pt required max verbal cues to close lips and manipulate bolus. No swallow attempts and suction provided to clear oral cavity; delayed coughing exhibited. Pt appears to be having difficulty swallowing secretions (lack of swallow attempts and open mouth posture). Pt looks dramatically different from Saturday when he was able to converse, follow directions, and swallow modified textures and liquids. Pt is at severe risk of aspiration given change in mentation at this time. Consider alternative means of nutrition- long term if neurologist expects delayed recovery. SLP will follow.  Chest x-ray from 06/21/2016: IMPRESSION: No edema or consolidation. Stable cardiac silhouette. Aortic atherosclerosis. No pneumothorax.  HPI HPI: 66 year old man with multiple lumbar/spinal surgeries, history of lumbar discitis and osteomyelitis-on chronic oral doxycycline, urinary retention requiring self bladder catheterizations since 2014, chronic lower abdominal pain, and HTN, who presented to the ED on 06/03/16 for lower abdominal pain and generalized weakness. He has had multiple surgeries on his left lower  extremity. Patient has been on chronic doxycycline for chronic discitis and osteomyelitis since 2015. Patient has a urine analysis positive for infection from January 26 with culture positive for Enterococcus faecalis. Dx: UTI, mild lactic acidosis without sepsis, 53mm R upper lung nodule, acute encephalopathy with mental status significantly declining on 06/09/2016 possibly due to UTI or adverse effect of Levaquin. Hypovolemic shock. Acute respiratory failure and intubation for airway protection. 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 Approximately 1 week into the hospitalization he developed acute encephalopathy with decreased mentation, was transferred to the stepdown unit and started on Precedex. He quickly decompensated and was intubated 2/7. He subsequently developed hypotension requiring vasopressor support. SLP to administer BSE and determine LRD.      SLP Plan  Continue with current plan of care       Recommendations  Diet recommendations: NPO Medication Administration: Via alternative means                Oral Care Recommendations: Oral care QID;Staff/trained caregiver to provide oral care Follow up Recommendations: Skilled Nursing facility SLP Visit Diagnosis: Dysphagia, oropharyngeal phase (R13.12) Plan: Continue with current plan of care       Thank you,  Marc Rose, [place]                 [place] 06/22/2016, 10:58 PM

## 2016-06-22 NOTE — Progress Notes (Signed)
Physical Therapy Treatment Patient Details Name: Marc Rose MRN: [id number] DOB: [birth year] Today's Date: 06/22/2016    History of Present Illness 66 year old man with multiple lumbar/spinal surgeries, history of lumbar discitis and osteomyelitis-on chronic oral doxycycline, urinary retention requiring self bladder catheterizations since 2014, chronic lower abdominal pain, and HTN, who presented to the ED on 06/03/16 for lower abdominal pain and generalized weakness. He has had multiple surgeries on his left lower extremity. Patient has been on chronic doxycycline for chronic discitis and osteomyelitis since 2015. Patient has a urine analysis positive for infection from January 26 with culture positive for Enterococcus faecalis.  Dx: UTI, mild lactic acidosis without sepsis, 42mm R upper lung nodule, acute encephalopathy with mental status significantly declining on 06/09/2016 possibly due to UTI or adverse effect of Levaquin.  Hypovolemic shock.  Acute respiratory failure and intubation for airway protection.  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 Completed 5 day course of IV IG.  MRI 06/21/16 was (-) for acute changes.     PT Comments    Pt received in bed, attempts to open eyes, but is only able to open the left slightly.  Noted L sided facial droop today with increased difficulty with verbal communication- mouthing words but unable to understand what he was attempting to communicate.  He continues to demonstrate extreme weakness and ataxia, requiring  Max A +2 for all functional mobility due to safety.  He was able to sit on the EOB x 1-2 min, however then began to strongly push posteriorly while extending both LE's, and was at high risk for falling off the EOB.  Therefore, he was quickly assisted back into the bed.   Pt fatigues quickly with attempts at bed level exercises.  Continue to  recommend LTACH or SNF due to pt's need for extensive assistance with all levels of care.    Follow Up Recommendations  LTACH;SNF;Supervision/Assistance - 24 hour     Equipment Recommendations  None recommended by PT    Recommendations for Other Services       Precautions / Restrictions Precautions Precautions: Fall (Simultaneous filing. User may not have seen previous data.) Precaution Comments: 100 in the past 6 months.  L knee is popping in and out.  Pt is very ataxic  (Simultaneous filing. User may not have seen previous data.) Restrictions Weight Bearing Restrictions: No (Simultaneous filing. User may not have seen previous data.) Other Position/Activity Restrictions: PEG tube, B mitts today    Mobility  Bed Mobility Overal bed mobility: Needs Assistance Bed Mobility: Supine to Sit;Sit to Supine;Rolling Rolling: Max assist;+2 for safety/equipment;+2 for physical assistance   Supine to sit: Max assist;HOB elevated;+2 for physical assistance;+2 for safety/equipment Sit to supine: +2 for physical assistance;+2 for safety/equipment   General bed mobility comments: Pt continues to demonstrate poor trunk control, therefore requiring increased assistance for all functional mobiltiy tasks.  Pt was only able to sit on the EOB for 1-2 minutes today, and then was assisted back into the bed due to pt strongly pushing posteriorly and extending legs out, placing him at risk to fall off the EOB.  Pt was not able to follow commands to place feet back on the floor, and therapist unable to assist them back on the floor.    Transfers                    Ambulation/Gait  Stairs            Wheelchair Mobility    Modified Rankin (Stroke Patients Only)       Balance Overall balance assessment: Needs assistance Sitting-balance support: Bilateral upper extremity supported;Feet supported Sitting balance-Leahy Scale: Poor Sitting balance - Comments: Max A  +2 for static sitting balance pt is able to attempt to lift head up, and perform upright sitting posture, but requires assistance to do so. Pt continues to have ataxia in all 4 extremities.  Unable to keep pt sitting on EOB due to strong posterior lean with pt lifting B LE's up in the air, and unable to place them back on the floor.                               Cognition Arousal/Alertness: Lethargic Behavior During Therapy: Restless Overall Cognitive Status: Difficult to assess                 General Comments: Pt is more lethargic today compared to previous treatments.  Not able to speak today - only mouthing words, but unable to make out what he is trying to state.    Exercises General Exercises - Upper Extremity Elbow Flexion: PROM;Both;10 reps;Supine Elbow Extension: PROM;10 reps;Both;Supine Other Exercises Other Exercises: Attempted bridging with PT holding pt's LE's in hooklying position, and cues for lifting hips.  He is able to demonstate trace muscle contraction x 3 reps. Other Exercises: Attempted heel slides, x3 reps each with max tc's and vc's.  Pt fatigues quickly.     General Comments        Pertinent Vitals/Pain Pain Assessment: No/denies pain    Home Living                      Prior Function            PT Goals (current goals can now be found in the care plan section) Acute Rehab PT Goals Patient Stated Goal: None stated Time For Goal Achievement: 06/29/16 Potential to Achieve Goals: Fair Progress towards PT goals: Progressing toward goals (Slow progression due to disease process)    Frequency    Min 6X/week      PT Plan Current plan remains appropriate    Co-evaluation PT/OT/SLP Co-Evaluation/Treatment: Yes Reason for Co-Treatment: Complexity of the patient's impairments (multi-system involvement);Necessary to address cognition/behavior during functional activity;For patient/therapist safety;To address functional/ADL  transfers PT goals addressed during session: Mobility/safety with mobility;Strengthening/ROM;Balance OT goals addressed during session: Strengthening/ROM;ADL's and self-care     End of Session Equipment Utilized During Treatment: Oxygen Activity Tolerance: Patient limited by fatigue Patient left: in bed;Other (comment);with bed alarm set;with restraints reapplied (B mitts re-applied) Nurse Communication: Mobility status (Crystal, RN notified of pt's mobility status) PT Visit Diagnosis: Muscle weakness (generalized) (M62.81);Other symptoms and signs involving the nervous system (R29.898)     Time:  -     Charges:  $Therapeutic Activity: 8-22 mins                    G Codes:  Functional Assessment Tool Used: AM-PAC 6 Clicks Basic Mobility    Beth Seirra Kos, PT, DPT X: [phone]

## 2016-06-22 NOTE — Care Management Note (Signed)
Case Management Note  Patient Details  Name: KENYA BEVELS MRN: [id number] Date of Birth: 14-Jul-1950      If discussed at [place] Length of Stay Meetings, dates discussed:  06/22/2016  Additional Comments:  Rosemae Mcquown, Chauncey Reading, RN 06/22/2016, 11:06 AM

## 2016-06-22 NOTE — Progress Notes (Signed)
Daily Progress Note   Patient Name: Marc Rose       Date: 06/22/2016 DOB: Oct 29, 1950  Age: 66 y.o. MRN#: [id number] Attending Physician: Samuella Cota, MD Primary Care Physician: [name] Patient Admit Date: 06/03/2016  Reason for Consultation/Follow-up: Disposition, Establishing goals of care and Psychosocial/spiritual support  Subjective: Marc Rose is lying quietly in bed. He is unable to communicate with me in any meaningful way. No family of bedside at this time. Call to sister, Marc Rose in [place]. We talk about his recent declines, information from neurology, projected outcomes. I encourage Marc Rose to make disposition regarding Marc Rose pets. We talk about [employer] website for trusted health information. Goal continues to be recovery with placement in long-term rehab/SNF.   Length of Stay: 19  Current Medications: Scheduled Meds:  . chlorhexidine gluconate (MEDLINE KIT)  15 mL Mouth Rinse BID  . Chlorhexidine Gluconate Cloth  6 each Topical Daily  . doxycycline (VIBRAMYCIN) IV  100 mg Intravenous Q12H  . enoxaparin (LOVENOX) injection  40 mg Subcutaneous Q24H  . famotidine  20 mg Oral Daily  . mouth rinse  15 mL Mouth Rinse BID  . pantoprazole (PROTONIX) IV  40 mg Intravenous Q24H  . polyvinyl alcohol  1 drop Both Eyes TID  . senna-docusate  1 tablet Oral QHS  . sodium chloride flush  10-40 mL Intracatheter Q12H  . tamsulosin  0.4 mg Oral QPC supper  . thiamine  100 mg Oral Daily    Continuous Infusions: . sodium chloride 125 mL/hr at 06/22/16 1323    PRN Meds: acetaminophen **OR** acetaminophen, bisacodyl, haloperidol lactate, LORazepam, ondansetron **OR** ondansetron (ZOFRAN) IV, sodium chloride flush  Physical Exam  Constitutional: No distress.   Lying quietly in bed  HENT:  Head: Normocephalic and atraumatic.  Cardiovascular: Normal rate and regular rhythm.   Rate 110's  Pulmonary/Chest: Effort normal. No respiratory distress.  Abdominal: Soft. He exhibits no distension.  Musculoskeletal: He exhibits no edema.  Neurological:  Lethargic  Skin: Skin is warm and dry.  Multiple areas of bruising  Nursing note and vitals reviewed.           Vital Signs: BP (!) 123/96   Pulse (!) 111   Temp 97.9 F (36.6 C) (Axillary)   Resp (!) 22   Ht '6\' 2"'  (1.88  m)   Wt 71.9 kg (158 lb 8.2 oz)   SpO2 99%   BMI 20.35 kg/m  SpO2: SpO2: 99 % O2 Device: O2 Device: Nasal Cannula O2 Flow Rate: O2 Flow Rate (L/min): 4.5 L/min  Intake/output summary:  Intake/Output Summary (Last 24 hours) at 06/22/16 1403 Last data filed at 06/22/16 1154  Gross per 24 hour  Intake                0 ml  Output             1300 ml  Net            -1300 ml   LBM: Last BM Date: 06/20/16 Baseline Weight: Weight: 74.8 kg (165 lb) Most recent weight: Weight: 71.9 kg (158 lb 8.2 oz)       Palliative Assessment/Data:    Flowsheet Rows   Flowsheet Row Most Recent Value  Intake Tab  Referral Department  Hospitalist  Unit at Time of Referral  ICU  Palliative Care Primary Diagnosis  Neurology  Date Notified  06/17/16  Palliative Care Type  New Palliative care  Reason for referral  Clarify Goals of Care  Date of Admission  06/03/16  Date first seen by Palliative Care  06/17/16  # of days Palliative referral response time  0 Day(s)  # of days IP prior to Palliative referral  14  Clinical Assessment  Palliative Performance Scale Score  20%  Pain Max last 24 hours  Not able to report  Pain Min Last 24 hours  Not able to report  Dyspnea Max Last 24 Hours  Not able to report  Dyspnea Min Last 24 hours  Not able to report  Psychosocial & Spiritual Assessment  Palliative Care Outcomes  Patient/Family meeting held?  Yes  Who was at the meeting?  With  sister, Marc Rose, at bedside  Marc Rose  Provided psychosocial or spiritual support  Palliative Care follow-up planned  -- [Follow-up while at [place]      Patient Active Problem List   Diagnosis Date Noted  . Pressure injury of skin 06/21/2016  . Palliative care encounter   . Goals of care, counseling/discussion   . 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 Malnutrition of moderate degree 06/10/2016  . Ataxic gait   . Muscle weakness (generalized)   . 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 Drug rash 11/12/2015  . Hyperpigmentation 05/15/2015  . Abdominal pain, right lower quadrant 02/05/2015  . Abscess, psoas ([place]) 02/05/2015  . Acute upper respiratory infection 02/05/2015  . Long term current use of antibiotics 02/05/2015  . Benign essential HTN 02/05/2015  . Edema leg 02/05/2015  . Complex regional pain syndrome type 2 of upper extremity 02/05/2015  . Back pain, chronic 02/05/2015  . CN (constipation) 02/05/2015  . CD (contact dermatitis) 02/05/2015  . Elevated fasting blood sugar 02/05/2015  . LBP (low back pain) 02/05/2015  . Discitis of lumbar region 02/05/2015  . Disorder of nutrition 02/05/2015  . Feeling bilious 02/05/2015  . Flu vaccine need 02/05/2015  . Peripheral neuropathic pain 02/05/2015  . Non compliance with medical treatment 02/05/2015  . Abnormal blood chemistry 02/05/2015  . Angiopathy, peripheral ([place]) 02/05/2015  . Nerve disease, peripheral ([place]) 02/05/2015  . Hypercholesterolemia without hypertriglyceridemia 02/05/2015  . Spinal stenosis 02/05/2015  . Buzzing in ear 02/05/2015  . Compulsive tobacco user syndrome 02/05/2015  . Bladder retention 02/05/2015  . Abnormal weight loss 02/05/2015  .  Diskitis 02/05/2015  . Hardware complicating wound infection ([place]) 02/05/2015  . Hyperlipidemia 07/05/2014  . Peripheral arterial disease ([place]) 07/05/2014  . Hypotension 09/16/2013  .  HTN (hypertension) 09/16/2013  . Anxiety 09/16/2013  . Back pain 09/16/2013    Palliative Care Assessment & Plan   Patient Profile: 66 y.o.malewith past medical history of arthritis, benign neoplasm of colon, chronic back and leg pain, diabetes, depression, hypertension, PAD, tobacco use disorderadmitted on 2/1/2018with UTI, Miller-Fisher variant Guillain-Barre syndrome.   Assessment:  Miller-Fisher variant Guillain-Barre syndrome: Long recovery anticipated. Completed 5 day course of IV IG. PT and OT. Usual course worsens for 4 weeks (likely at nadir of deterioration now) and then gradually improves over 3-4 months. Encephalopathy; unknown source, may simply be at the nadir of his clinical course.  Recommendations/Plan:  long-term SNF, months.   Goals of Care and Additional Recommendations:  Limitations on Scope of Treatment: Full Scope Treatment, Continue to treat the treatable at this point, code status discussions at a later date when patient is more stable.  Code Status:    Code Status Orders        Start     Ordered   06/03/16 1518  Full code  Continuous     06/03/16 1517    Code Status History    Date Active Date Inactive Code Status Order ID Comments User Context   09/16/2013 10:03 PM 09/19/2013  4:23 PM Full Code [id number]  Doree Albee, MD ED    Advance Directive Documentation   [place] Most Recent Value  Type of Advance Directive  Living will  Pre-existing out of facility DNR order (yellow form or pink MOST form)  No data  "MOST" Form in Place?  No data       Prognosis:   Unable to determine, based on outcomes  Discharge Planning:  SNF for rehab, family is requesting [facility], or [facility]. It seems that Mr. Rothe no longer needs long-term acute care at this point, but will likely need months of rehabilitation.  Care plan was discussed with nursing staff, case manager, social worker, and Dr. Sarajane Jews  Thank you for allowing the  Palliative Medicine Team to assist in the care of this patient.   Time In: 0935 Time Out: 1015 Total Time 40 minutes  Prolonged Time Billed  no       Greater than 50%  of this time was spent counseling and coordinating care related to the above assessment and plan.  Drue Novel, NP  Please contact Palliative Medicine Team phone at [phone] for questions and concerns.

## 2016-06-22 NOTE — Progress Notes (Signed)
Marc A. [name], MD     [email or web address]          Marc Rose is an 67 y.o. male.   ASSESSMENT/PLAN:      Resolving Severe delirium/encephalopathy of unclear etiology at this time. We are suspecting that this may be multifactorial including UTI and medication effect from Levaquin. Levaquin has been discontinued. The picture seems most consistent with toxic metabolic encephalopathy/delirium as opposed to primary and CNS problem. The patient's spinal fluid analysis shows the typical formula with elevated protein and essentially unremarkable white cell count (cytoalbuminologic dissociation) seen in demyelinating conditions such as Guillain-Barr syndrome. The CSF does not show evidence of central nervous system infection.  I recommend that agitation be treated with when necessary doses of Haldol. I will increase his maintenance dose of Haldol. Will continue with the Seroquel. Around the clock haldol restarted.    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 Completed 5 day course of IV IG. PT and OT. Usual course worsens for 4 weeks (likely at nadir of deterioration now) and then gradually improves over 3-4 months.  L facial weakness and dysarthria also due to Guillain-Barr syndrome    Low back pain status post L1-L2 laminectomy. HX of chronic discitis      He is more responsive today. Unfortunately his neurological deficits remained the same which is expected. He has not deteriorated however. Continues to have significant dysarthria and the severe ataxia and weakness.      GENERAL: He is extubated.   HEENT: Supple. Atraumatic normocephalic. Dense cataracts both sides.  ABDOMEN: soft  EXTREMITIES: No edema. Multiple abrasions and lacerations involving the knees and legs.   BACK: Normal.  SKIN: Normal by inspection.    MENTAL STATUS:  The patient is laying in bed with eyes open. He does tracks and makes eye contact. He is much more responsive but has severe dysarthria. He does gestures yes and no shaking his head affirmatively. He does follow midline and appendicular commands.  CRANIAL NERVES: Pupils are mid position and reactive. He seems to have R lateral rectus paresis; Facial muscles are plegic on L but normal R.    MOTOR: Moves both sides arms 3-4/5; legs 3/5  COORDINATION: Very ataxic - trunk and appendiular  REFLEXES: The patient is areflexic in the legs and upper extremities.    EEG normal  Blood pressure (!) 124/95, pulse (!) 108, temperature 97.9 F (36.6 C), temperature source Axillary, resp. rate 19, height '6\' 2"'  (1.88 m), weight 158 lb 8.2 oz (71.9 kg), SpO2 94 %.  Past Medical History:  Diagnosis Date  . Anxiety state, unspecified   . Arthritis   . 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 Benign neoplasm of colon   . Causalgia of upper limb    Left limb  . Chronic back pain   . Chronic leg pain   . Depression   . Diabetes mellitus   . Diskitis 02/05/2015  . Drug rash 11/12/2015  . Elevated blood pressure reading without diagnosis of hypertension   . Essential hypertension, benign   . Hardware complicating wound infection ([place]) 02/05/2015  . Hyperpigmentation 05/15/2015  . Hypertension   . Impaired fasting glucose   . Peripheral arterial disease ([place])   . Pure hypercholesterolemia   . Tobacco use disorder   . Unspecified retinal detachment     Past Surgical History:  Procedure Laterality Date  . ANKLE SURGERY  left  . APPENDECTOMY    . BACK SURGERY    . COLONOSCOPY    . KNEE SURGERY     left    Family History  Problem Relation Age of Onset  . COPD Mother   . Hyperthyroidism Mother   . Hyperthyroidism Sister     Social History:  reports that he has been smoking Cigarettes.  He has a 19.50 pack-year smoking history. He has never used smokeless tobacco. He reports that he uses drugs, including  Marijuana. He reports that he does not drink alcohol.  Allergies:  Allergies  Allergen Reactions  . Codeine Anaphylaxis, Hives and Swelling  . Penicillins Anaphylaxis, Hives and Swelling  . Latex Itching  . Strawberry Extract Hives    Medications: Prior to Admission medications   Medication Sig Start Date End Date Taking? Authorizing Provider  aspirin EC 81 MG tablet Take 81 mg by mouth daily.   Yes Historical Provider, MD  benazepril (LOTENSIN) 40 MG tablet Take 40 mg by mouth daily.   Yes Historical Provider, MD  cyclobenzaprine (FLEXERIL) 10 MG tablet Take 1 tablet (10 mg total) by mouth 3 (three) times daily as needed for muscle spasms. 09/19/13  Yes Adeline Saralyn Pilar, MD  diazepam (VALIUM) 10 MG tablet Take 0.5 tablets (5 mg total) by mouth every 6 (six) hours as needed for anxiety (back spasms). Patient taking differently: Take 10 mg by mouth 4 (four) times daily as needed for anxiety (back spasms).  09/19/13  Yes Adeline Saralyn Pilar, MD  doxycycline (VIBRA-TABS) 100 MG tablet Take 1 tablet (100 mg total) by mouth 2 (two) times daily. 11/27/15  Yes Truman Hayward, MD  fluticasone [facility]) 50 MCG/ACT nasal spray Place 1 spray into the nose daily as needed for allergies.  07/04/14  Yes Historical Provider, MD  NEOMYCIN-POLYMYXIN-HYDROCORTISONE (CORTISPORIN) 1 % SOLN otic solution Place 2 drops in ear(s) daily. Reported on 11/12/2015 07/04/14  Yes Historical Provider, MD  pregabalin (LYRICA) 75 MG capsule Take 75 mg by mouth 2 (two) times daily.  05/07/13  Yes Historical Provider, MD  tamsulosin (FLOMAX) 0.4 MG CAPS capsule Take 0.4 mg by mouth.   Yes Historical Provider, MD    Scheduled Meds: . chlorhexidine gluconate (MEDLINE KIT)  15 mL Mouth Rinse BID  . Chlorhexidine Gluconate Cloth  6 each Topical Daily  . doxycycline (VIBRAMYCIN) IV  100 mg Intravenous Q12H  . enoxaparin (LOVENOX) injection  40 mg Subcutaneous Q24H  . famotidine  20 mg Oral Daily  . mouth rinse  15 mL Mouth Rinse  BID  . pantoprazole (PROTONIX) IV  40 mg Intravenous Q24H  . polyvinyl alcohol  1 drop Both Eyes TID  . senna-docusate  1 tablet Oral QHS  . sodium chloride flush  10-40 mL Intracatheter Q12H  . tamsulosin  0.4 mg Oral QPC supper  . thiamine  100 mg Oral Daily   Continuous Infusions: . 0.9 % NaCl with KCl 40 mEq / L 125 mL/hr (06/22/16 1629)   PRN Meds:.acetaminophen **OR** acetaminophen, bisacodyl, haloperidol lactate, LORazepam, ondansetron **OR** ondansetron (ZOFRAN) IV, sodium chloride flush     Results for orders placed or performed during the hospital encounter of 06/03/16 (from the past 48 hour(s))  Glucose, capillary     Status: Abnormal   Collection Time: 06/20/16  6:24 PM  Result Value Ref Range   Glucose-Capillary 116 (H) 65 - 99 mg/dL  Glucose, capillary     Status: None   Collection Time: 06/20/16  9:46 PM  Result Value Ref Range   Glucose-Capillary 98 65 - 99 mg/dL   Comment 1 Notify RN   Basic metabolic panel     Status: Abnormal   Collection Time: 06/21/16  4:31 AM  Result Value Ref Range   Sodium 136 135 - 145 mmol/L   Potassium 3.4 (L) 3.5 - 5.1 mmol/L   Chloride 99 (L) 101 - 111 mmol/L   CO2 30 22 - 32 mmol/L   Glucose, Bld 110 (H) 65 - 99 mg/dL   BUN 13 6 - 20 mg/dL   Creatinine, Ser 0.57 (L) 0.61 - 1.24 mg/dL   Calcium 8.5 (L) 8.9 - 10.3 mg/dL   GFR calc non Af Amer >60 >60 mL/min   GFR calc Af Amer >60 >60 mL/min    Comment: (NOTE) The eGFR has been calculated using the CKD EPI equation. This calculation has not been validated in all clinical situations. eGFR's persistently <60 mL/min signify possible Chronic Kidney Disease.    Anion gap 7 5 - 15  Glucose, capillary     Status: None   Collection Time: 06/21/16  7:28 AM  Result Value Ref Range   Glucose-Capillary 97 65 - 99 mg/dL  Blood gas, arterial     Status: Abnormal   Collection Time: 06/21/16  9:00 AM  Result Value Ref Range   O2 Content 2.0 L/min   Delivery systems NASAL CANNULA     pH, Arterial 7.431 7.350 - 7.450   pCO2 arterial 46.1 32.0 - 48.0 mmHg   pO2, Arterial 74.9 (L) 83.0 - 108.0 mmHg   Bicarbonate 29.4 (H) 20.0 - 28.0 mmol/L   Acid-Base Excess 5.9 (H) 0.0 - 2.0 mmol/L   O2 Saturation 94.5 %   Collection site LEFT RADIAL    Drawn by [phone]    Sample type ARTERIAL    Allens test (pass/fail) PASS PASS  Glucose, capillary     Status: Abnormal   Collection Time: 06/21/16 12:18 PM  Result Value Ref Range   Glucose-Capillary 110 (H) 65 - 99 mg/dL  Glucose, capillary     Status: Abnormal   Collection Time: 06/21/16  7:58 PM  Result Value Ref Range   Glucose-Capillary 100 (H) 65 - 99 mg/dL   Comment 1 Notify RN    Comment 2 Document in Chart   Glucose, capillary     Status: Abnormal   Collection Time: 06/21/16 11:54 PM  Result Value Ref Range   Glucose-Capillary 112 (H) 65 - 99 mg/dL   Comment 1 Notify RN    Comment 2 Document in Chart   Glucose, capillary     Status: Abnormal   Collection Time: 06/22/16  3:06 AM  Result Value Ref Range   Glucose-Capillary 103 (H) 65 - 99 mg/dL   Comment 1 Notify RN    Comment 2 Document in Chart   Glucose, capillary     Status: None   Collection Time: 06/22/16  7:23 AM  Result Value Ref Range   Glucose-Capillary 99 65 - 99 mg/dL  Glucose, capillary     Status: Abnormal   Collection Time: 06/22/16 11:24 AM  Result Value Ref Range   Glucose-Capillary 135 (H) 65 - 99 mg/dL  Basic metabolic panel     Status: Abnormal   Collection Time: 06/22/16 12:08 PM  Result Value Ref Range   Sodium 140 135 - 145 mmol/L   Potassium 3.3 (L) 3.5 - 5.1 mmol/L   Chloride 101 101 - 111 mmol/L   CO2 31 22 -  32 mmol/L   Glucose, Bld 141 (H) 65 - 99 mg/dL   BUN 16 6 - 20 mg/dL   Creatinine, Ser 0.65 0.61 - 1.24 mg/dL   Calcium 8.7 (L) 8.9 - 10.3 mg/dL   GFR calc non Af Amer >60 >60 mL/min   GFR calc Af Amer >60 >60 mL/min    Comment: (NOTE) The eGFR has been calculated using the CKD EPI equation. This calculation has not been  validated in all clinical situations. eGFR's persistently <60 mL/min signify possible Chronic Kidney Disease.    Anion gap 8 5 - 15  Magnesium     Status: None   Collection Time: 06/22/16 12:08 PM  Result Value Ref Range   Magnesium 1.9 1.7 - 2.4 mg/dL  Phosphorus     Status: None   Collection Time: 06/22/16 12:08 PM  Result Value Ref Range   Phosphorus 3.1 2.5 - 4.6 mg/dL  Glucose, capillary     Status: Abnormal   Collection Time: 06/22/16  4:17 PM  Result Value Ref Range   Glucose-Capillary 103 (H) 65 - 99 mg/dL    Studies/Results:  CTA LUNG IMPRESSION: Manubrial fracture  No evidence of pulmonary emboli.  Left lower lobe infiltrate and effusion.   REPEAT MRI/MRA 06-21-16 FINDINGS: MRI HEAD FINDINGS  Axial and coronal T2 sequences are severely motion degraded. There is mild motion artifact on other sequences.  Brain: There is no evidence of acute infarct, intracranial hemorrhage, mass, midline shift, or extra-axial fluid collection. Mild generalized cerebral atrophy is within normal limits for age. No significant cerebral white matter disease is seen for age.  Vascular: Major intracranial vascular flow voids not well assessed due to motion.  Skull and upper cervical spine: No gross osseous lesion.  Sinuses/Orbits: No gross abnormality.  Other: None.  MRA HEAD FINDINGS  The visualized distal vertebral arteries are widely patent and codominant. The basilar artery is widely patent. Posterior communicating arteries are diminutive or absent. PCAs are patent without evidence of significant stenosis.  Internal carotid arteries are patent from skullbase to carotid termini without evidence of significant stenosis. ACAs and MCAs are patent without evidence of major branch occlusion or significant proximal stenosis allowing for mild motion artifact. No intracranial aneurysm is identified.  IMPRESSION: 1. Motion degraded examination without evidence  of acute intracranial abnormality. 2. Negative head MRA.            BRAIN MRI FINDINGS: Brain: Cerebral volume is within normal limits for age. No restricted diffusion to suggest acute infarction. No midline shift, mass effect, evidence of mass lesion, ventriculomegaly, extra-axial collection or acute intracranial hemorrhage. Cervicomedullary junction and pituitary are within normal limits.  Pearline Cables and white matter signal is within normal limits for age throughout the brain. No cortical encephalomalacia or chronic cerebral blood products.  Vascular: Major intracranial vascular flow voids are preserved.  Skull and upper cervical spine: Negative. Normal bone marrow signal.  Sinuses/Orbits: Normal orbits soft tissues. Trace paranasal sinus mucosal thickening.  Other: Visible internal auditory structures appear normal. Mastoids are clear. Negative scalp soft tissues.  IMPRESSION: No acute intracranial abnormality. Normal for age noncontrast MRI appearance of the brain.            L SPINE MRI 2015 FINDINGS: Trace anterolisthesis of L3 on L4 and L4 on L5 is unchanged. There is grade 1 retrolisthesis of L1 on L2, stable to minimally increased from prior MRI. New from the prior MRI is a mild compression deformity involving the L2 superior endplate with approximately  10% vertebral body height loss. There is abnormal fluid signal within the L1-2 disc space, and there is also new edema throughout the L1 and L2 vertebral bodies. There is paraspinal inflammatory change/ phlegmon at this level, and there is a 1.9 x 1.3 cm fluid collection in the left psoas muscle at the L2 level (series 5, image 16).  Advanced disc space height loss at L5-S1 is unchanged, however there is increased fluid signal within the disc space compared to the prior MRI with mildly increased marrow edema within the adjacent L5 and S1 vertebral bodies. The conus medullaris is normal in  signal and terminates at the superior aspect of L2.  T11-12: Only imaged sagittally. Facet hypertrophy results in mild bilateral neural foraminal narrowing, right greater than left and unchanged.  T12-L1:  Negative.  L1-2: Sequelae of prior laminectomy and posterior fusion are again identified. Posterior retropulsion of the superior L2 endplate, greater on the right, with possibly small adjacent epidural fluid collection, results in new narrowing of the right lateral recess and increased right neural foraminal narrowing. No spinal canal stenosis.  L2-3: Mild disc bulge and facet hypertrophy result in mild right lateral recess and neural foraminal narrowing, unchanged.  L3-4: Mild disc bulge and moderate facet hypertrophy result in mild right neural foraminal narrowing, unchanged. No spinal canal stenosis.  L4-5: Left foraminal disc protrusion and mild-to-moderate facet hypertrophy result in mild to moderate left neural foraminal stenosis, unchanged. No spinal canal stenosis.  L5-S1: Disc bulge and mild facet hypertrophy result in mild to moderate bilateral neural foraminal stenosis, unchanged. No spinal canal stenosis.  IMPRESSION: 1. Changes at L1-2 as above concerning for discitis/osteomyelitis with small left psoas abscess. New, mild compression deformity of the L2 superior endplate and mild retropulsion results in new right lateral recess narrowing and increased right neural foraminal narrowing at L1-2. Small ventral epidural fluid collection is not excluded on the right. 2. Advanced degenerative disc disease at L5-S1 with a small amount of new fluid signal in the disc space and mildly increased adjacent marrow changes. Early discitis/osteomyelitis is not excluded.       Monique Hefty A. Merlene Rose, M.D.  Diplomate, Tax adviser of Psychiatry and Neurology ( Neurology). 06/22/2016, 5:00 PM

## 2016-06-22 NOTE — Progress Notes (Signed)
Marc Rose:[id number] DOB: [birth year] DOA: 06/03/2016 PCP: [name] Patient  Brief Narrative: 66 year old man multiple medical problems presented with worsening bilateral lower extremity weakness resulting in multiple falls and traumatic skin tears on the feet and knees, also reported severe abdominal pain with fullness sensation in lower abdomen. Admitted for UTI, acute kidney injury, generalized weakness. He was evaluated by therapy who recommended skilled nursing facility. 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 He was treated with IVIG. Approximately 1 week into the hospitalization he developed acute encephalopathy with decreased mentation, was transferred to the stepdown unit and started on Precedex. He quickly decompensated and was intubated 2/7. He subsequently developed hypotension requiring vasopressor support, likely etiology being autonomic dysfunction. He was successfully extubated 2/16. For the following 48 hours diet was advanced and he did relatively well. 2/19 developed acute encephalopathy, decreased responsiveness. Investigation including MRI brain, ABG and chest x-ray were unremarkable. At this time he remains encephalopathic, unable to eat. He will eventually need skilled nursing facility rehabilitation.  Assessment/Plan 1. Acute encephalopathy, persistent. Etiology unclear. Chart reviewed in detail. The patient initially decompensated 2/6 and was subsequently intubated. Status post extubation 2/16 the patient did well from a mentation standpoint until the evening of 2/13. MRI of the brain, ABG and chest x-ray were unremarkable. There is no evidence of infection or metabolic derangement. Lyrica had been restarted status post extubation, however he was on this medication prior to admission and on admission for week, therefore doubt this. No other new medications that would be  likely. As noted by neurology, he may simply be at the nadir of his clinical course.  Supportive care at this point, workup as above negative. Will discontinue Lyrica and scheduled Haldol and quetiapine. Monitor respiratory status closely.  Consider repeat EEG 2/21 in clinical condition fails to improve.  2. [name] variant of GBS, status post IVIG treatment. Associated left facial weakness. Profound generalized weakness. Will need skilled nursing facility rehabilitation.  3. Dysphagia.   Secondary to above. Currently 2 encephalopathic to eat. If does not improve over the next 24 hours, would consider alternative nutrition.  4. Acute hypoxic respiratory failure requiring ventilator support. Resolved. Successfully extubated 2/16. 5. Hypotension requiring vasopressor support. Resolved. Suspect autonomic dysfunction. No evidence of sepsis. Echocardiogram was reassuring. 6. Acute kidney injury secondary to prerenal azotemia, resolved. 7. Hyponatremia secondary to hypovolemic status. Resolved. 8. PMH multiple lumbar/spinal surgeries, chronic lumbar discitis and osteomyelitis on suppressive therapy with doxycycline, urinary retention requiring self bladder catheterization since 2014  9. Chronic lower abdominal pain. Stable. CT abdomen and pelvis no acute finding. Protonix for esophagitis. 10. 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 Reported progressive spasticity arms and legs. 11. Peripheral arterial disease. Stable. 12. Status post enterococcus UTI associated with outpatient in/out self bladder catheterizations 13. Moderate malnutrition 14. Manubrial fracture seen on chest CT. Discussed with thoracic surgery Dr. Servando Snare. Recommended activity as tolerated, no heavy lifting, follow-up chest x-ray with sternal view as an outpatient. No other follow-up recommended at this point. 15. Right upper lobe  nodule. Consider chest CT in 12 months. 16. Aortic atherosclerosis 17. Prediabetes. No need for sliding scale given stability of blood sugars.   Mentation has worsened over the last 48 hours, he is now nothing by mouth, and does not follow commands. Eventually will need rehabilitation.  DVT proph: Lovenox Code Status:  full code Family Communication: As above Disposition Plan: pending   [name], MD  [facility] Direct contact: [phone] --[address] [place]  --[email or web address]; password [email or web address]  7PM-7AM contact night coverage as above 06/22/2016, 10:01 AM  LOS: 19 days   Consultants:  Neurology  Pulmonology  Procedures:  Intubation 2/7>> 2/16  Lumbar puncture 2/8  EEG IMPRESSION: This is a normal recording of the awake and drowsy states.  Echo Study Conclusions  - Left ventricle: The cavity size was normal. Wall thickness was   increased in a pattern of mild LVH. Systolic function was normal.   The estimated ejection fraction was in the range of 60% to 65%.   Doppler parameters are consistent with abnormal left ventricular   relaxation (grade 1 diastolic dysfunction). - Aortic valve: Calcified non coronary cusp. Valve area (VTI): 1.83   cm^2. Valve area (Vmax): 1.83 cm^2. Valve area (Vmean): 1.84   cm^2. - Atrial septum: No defect or patent foramen ovale was identified. - Pulmonary arteries: PA peak pressure: 39 mm Hg (S). Antimicrobials:  None today  Interval history/Subjective: Somnolent overnight. Started on Haldol by neurology. To somnolent to take by mouth this morning.   Objective: Vitals:   06/22/16 0400 06/22/16 0500 06/22/16 0600 06/22/16 0724  BP: 126/89 (!) 112/97 (!) 123/96   Pulse: (!) 114 (!) 121 (!) 120 (!) 115  Resp: (!) 25 (!) 23 (!) 32 19  Temp:    98.7 F (37.1 C)  TempSrc:    Axillary  SpO2: 97% 95% 95% 95%  Weight:  71.9 kg (158 lb 8.2 oz)    Height:        Intake/Output Summary (Last 24 hours) at 06/22/16 1001 Last  data filed at 06/22/16 0947  Gross per 24 hour  Intake                0 ml  Output             1300 ml  Net            -1300 ml     Filed Weights   06/20/16 0500 06/21/16 0400 06/22/16 0500  Weight: 71.9 kg (158 lb 8.2 oz) 69.9 kg (154 lb 1.6 oz) 71.9 kg (158 lb 8.2 oz)    Exam:    Constitutional. Appears worse today. Does not arouse to voice. Nontoxic. Appears ill.  Eyes. Pupils, irises, lids appear unremarkable.  ENT. Lips appear unremarkable. Does not follow commands.  Cardiovascular. Tachycardic, regular rhythm. No murmur, rub or gallop. No lower extremity edema. Telemetry shows sinus tachycardia, brief SVT.  Respiratory.Clear bilaterally. No frank wheezes, rales or rhonchi. Diminished breath sounds on the left, likely related to the patient lying on his left side. Normal respiratory effort.  Musculoskeletal. Moves all extremities spontaneously.  Psychiatric. Unable to assess. Somnolent. Does not arouse to voice.  Skin. No change in exam. Multiple abrasions bilateral lower extremities.   I have personally reviewed following labs and imaging studies:  Urine output 1300. No oral intake.  Blood sugars stable.  Scheduled Meds: . chlorhexidine gluconate (MEDLINE KIT)  15 mL Mouth Rinse BID  . Chlorhexidine Gluconate Cloth  6 each Topical Daily  . doxycycline (VIBRAMYCIN) IV  100 mg Intravenous Q12H  . enoxaparin (LOVENOX) injection  40 mg Subcutaneous Q24H  . famotidine  20 mg Oral Daily  . haloperidol  5 mg Oral TID  . mouth rinse  15 mL Mouth Rinse BID  . pantoprazole (PROTONIX) IV  40 mg Intravenous  Q24H  . polyvinyl alcohol  1 drop Both Eyes TID  . pregabalin  75 mg Oral BID  . QUEtiapine  50 mg Oral QHS  . senna-docusate  1 tablet Oral QHS  . sodium chloride flush  10-40 mL Intracatheter Q12H  . tamsulosin  0.4 mg Oral QPC supper  . thiamine  100 mg Oral Daily   Continuous Infusions:   Principal Problem:   Miller-Fisher variant Guillain-Barre syndrome  (HCC) Active Problems:   HTN (hypertension)   Long term current use of antibiotics   Back pain, chronic   Discitis of lumbar region   Nerve disease, peripheral (HCC)   Neurogenic urinary bladder disorder   Ataxia   Muscle weakness (generalized)   Ataxic gait   Malnutrition of moderate degree   Palliative care encounter   Goals of care, counseling/discussion   Pressure injury of skin   LOS: 19 days

## 2016-06-22 NOTE — Progress Notes (Signed)
Patient continues to stir around in the bed, slinging his legs off the side of the bed and thrashing his arms at time. Patient remains on self rotating bed, low air mattress to assist with pressure relief since admission to ICU. Patient bathed/skin care, foley/peri care performed Q shift and foam drsg to Stage II on sacrum changed Q3D & PRN. No drainage to sacral wound noted at this time.

## 2016-06-22 NOTE — Progress Notes (Signed)
Events of yesterday noted. Chest x-ray was done which I personally reviewed and does not show an infiltrate. His arterial blood gas did not show significant hypercapnia. MRI did not show a stroke. It does not appear that he has ongoing respiratory problems and his problems are neurologic so I will plan to sign off. I will be happy to see him if needed.  Thanks for allowing me to see him with you

## 2016-06-22 NOTE — Plan of Care (Signed)
Problem: Coping: Goal: Level of anxiety will decrease Outcome: Not Progressing Pt is not communicating verbally.to express needs  Problem: Nutritional: Goal: Intake of prescribed amount of daily calories will improve Outcome: Not Progressing Pt is now npo due to thrashing and altered level of consiousness.  Npo to prevent aspiration  Problem: Respiratory: Goal: Ability to maintain a clear airway and adequate ventilation will improve Outcome: Progressing Oral care and vap protocols remain in place.hob elevated 30 degrees  Problem: Role Relationship: Goal: Method of communication will improve Outcome: Not Progressing Ipt 9is now non verbal

## 2016-06-23 ENCOUNTER — Inpatient Hospital Stay (HOSPITAL_COMMUNITY): Payer: PPO

## 2016-06-23 DIAGNOSIS — R4182 Altered mental status, unspecified: Secondary | ICD-10-CM | POA: Diagnosis not present

## 2016-06-23 DIAGNOSIS — G9340 Encephalopathy, unspecified: Secondary | ICD-10-CM

## 2016-06-23 DIAGNOSIS — R2689 Other abnormalities of gait and mobility: Secondary | ICD-10-CM | POA: Diagnosis not present

## 2016-06-23 DIAGNOSIS — G629 Polyneuropathy, unspecified: Secondary | ICD-10-CM | POA: Diagnosis not present

## 2016-06-23 DIAGNOSIS — N390 Urinary tract infection, site not specified: Secondary | ICD-10-CM

## 2016-06-23 LAB — GLUCOSE, CAPILLARY
GLUCOSE-CAPILLARY: 103 mg/dL — AB (ref 65–99)
GLUCOSE-CAPILLARY: 110 mg/dL — AB (ref 65–99)
GLUCOSE-CAPILLARY: 85 mg/dL (ref 65–99)
GLUCOSE-CAPILLARY: 89 mg/dL (ref 65–99)
GLUCOSE-CAPILLARY: 96 mg/dL (ref 65–99)
Glucose-Capillary: 115 mg/dL — ABNORMAL HIGH (ref 65–99)
Glucose-Capillary: 90 mg/dL (ref 65–99)

## 2016-06-23 LAB — BLOOD GAS, ARTERIAL
Acid-Base Excess: 2.9 mmol/L — ABNORMAL HIGH (ref 0.0–2.0)
Bicarbonate: 26.7 mmol/L (ref 20.0–28.0)
DRAWN BY: 21310
FIO2: 100
O2 Saturation: 93.4 %
PEEP: 5 cmH2O
Patient temperature: 37
RATE: 14 resp/min
VT: 550 mL
pCO2 arterial: 42.4 mmHg (ref 32.0–48.0)
pH, Arterial: 7.421 (ref 7.350–7.450)
pO2, Arterial: 70.4 mmHg — ABNORMAL LOW (ref 83.0–108.0)

## 2016-06-23 LAB — CBC
HEMATOCRIT: 27.3 % — AB (ref 39.0–52.0)
Hemoglobin: 8.8 g/dL — ABNORMAL LOW (ref 13.0–17.0)
MCH: 32 pg (ref 26.0–34.0)
MCHC: 32.2 g/dL (ref 30.0–36.0)
MCV: 99.3 fL (ref 78.0–100.0)
Platelets: 420 10*3/uL — ABNORMAL HIGH (ref 150–400)
RBC: 2.75 MIL/uL — ABNORMAL LOW (ref 4.22–5.81)
RDW: 14.9 % (ref 11.5–15.5)
WBC: 7.2 10*3/uL (ref 4.0–10.5)

## 2016-06-23 LAB — BASIC METABOLIC PANEL
Anion gap: 7 (ref 5–15)
BUN: 15 mg/dL (ref 6–20)
CHLORIDE: 108 mmol/L (ref 101–111)
CO2: 28 mmol/L (ref 22–32)
Calcium: 8.1 mg/dL — ABNORMAL LOW (ref 8.9–10.3)
Creatinine, Ser: 0.68 mg/dL (ref 0.61–1.24)
GFR calc Af Amer: >60 mL/min (ref >60)
GFR calc non Af Amer: >60 mL/min (ref >60)
GLUCOSE: 98 mg/dL (ref 65–99)
POTASSIUM: 3.4 mmol/L — AB (ref 3.5–5.1)
Sodium: 143 mmol/L (ref 135–145)

## 2016-06-23 LAB — MAGNESIUM: Magnesium: 1.7 mg/dL (ref 1.7–2.4)

## 2016-06-23 LAB — PHOSPHORUS: Phosphorus: 2.4 mg/dL — ABNORMAL LOW (ref 2.5–4.6)

## 2016-06-23 MED ORDER — SUCCINYLCHOLINE CHLORIDE 20 MG/ML IJ SOLN
100.0000 mg | Freq: Once | INTRAMUSCULAR | Status: AC
  Administered 2016-06-23: 100 mg via INTRAVENOUS

## 2016-06-23 MED ORDER — FENTANYL CITRATE (PF) 2500 MCG/50ML IJ SOLN
INTRAMUSCULAR | Status: AC
  Filled 2016-06-23: qty 50

## 2016-06-23 MED ORDER — ETOMIDATE 2 MG/ML IV SOLN
20.0000 mg | Freq: Once | INTRAVENOUS | Status: AC
  Administered 2016-06-23: 20 mg via INTRAVENOUS

## 2016-06-23 MED ORDER — SODIUM CHLORIDE 0.9 % IV BOLUS (SEPSIS)
1000.0000 mL | Freq: Once | INTRAVENOUS | Status: AC
  Administered 2016-06-23: 1000 mL via INTRAVENOUS

## 2016-06-23 MED ORDER — MIDAZOLAM HCL 50 MG/10ML IJ SOLN
INTRAMUSCULAR | Status: AC
  Filled 2016-06-23: qty 1

## 2016-06-23 MED ORDER — MIDAZOLAM HCL 50 MG/10ML IJ SOLN
INTRAMUSCULAR | Status: AC
Start: 2016-06-23 — End: 2016-06-23
  Filled 2016-06-23: qty 1

## 2016-06-23 MED ORDER — IMMUNE GLOBULIN (HUMAN) 10 GM/100ML IV SOLN
400.0000 mg/kg | INTRAVENOUS | Status: DC
  Filled 2016-06-23 (×2): qty 300

## 2016-06-23 MED ORDER — FENTANYL CITRATE (PF) 100 MCG/2ML IJ SOLN
25.0000 ug | INTRAMUSCULAR | Status: DC | PRN
  Administered 2016-06-23 (×2): 100 ug via INTRAVENOUS
  Filled 2016-06-23 (×3): qty 2

## 2016-06-23 MED ORDER — SODIUM CHLORIDE 0.9 % IV SOLN
0.5000 mg/h | INTRAVENOUS | Status: DC
  Administered 2016-06-23: 0.5 mg/h via INTRAVENOUS
  Administered 2016-06-24: 3 mg/h via INTRAVENOUS
  Administered 2016-06-25: 1 mg/h via INTRAVENOUS
  Filled 2016-06-23 (×2): qty 10

## 2016-06-23 MED ORDER — FENTANYL CITRATE (PF) 2500 MCG/50ML IJ SOLN
25.0000 ug/h | INTRAMUSCULAR | Status: DC
  Administered 2016-06-23: 100 ug/h via INTRAVENOUS
  Administered 2016-06-24: 300 ug/h via INTRAVENOUS
  Administered 2016-06-25: 250 ug/h via INTRAVENOUS
  Administered 2016-06-25: 100 ug/h via INTRAVENOUS
  Filled 2016-06-23 (×3): qty 50

## 2016-06-23 MED ORDER — FENTANYL BOLUS VIA INFUSION
25.0000 ug | INTRAVENOUS | Status: DC | PRN
  Filled 2016-06-23: qty 25

## 2016-06-23 MED ORDER — CLINDAMYCIN PHOSPHATE 600 MG/50ML IV SOLN
600.0000 mg | Freq: Three times a day (TID) | INTRAVENOUS | Status: DC
  Administered 2016-06-23 – 2016-06-25 (×7): 600 mg via INTRAVENOUS
  Filled 2016-06-23 (×16): qty 50

## 2016-06-23 NOTE — Progress Notes (Signed)
Nutrition Follow-up  DOCUMENTATION CODES:   Non-severe (moderate) malnutrition in context of acute illness/injury  INTERVENTION:  - As medically able, recommend initiation of tube feeding of Vital AF 1.2 @ 65 mL/hr providing 1872 calories, 117 grams protein and 1264 mL free water  NUTRITION DIAGNOSIS:   Inadequate oral intake related to inability to eat as evidenced by NPO status.  Ongoing  GOAL:   Patient will meet greater than or equal to 90% of their needs  Unmet  MONITOR:   Diet advancement, Vent status, Labs, TF tolerance, I & O's  REASON FOR ASSESSMENT:    Ventilator   ASSESSMENT:   66 y/o male PMhx DM, PAD, HTN, Depression/Anxiety, Tobacco abuse, urinary retention. Originally had presented due to BLE weakness that had resulted in falls and traumatic skin lesions. Worked up for UTI, AKI. 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  Mental status worsened and had difficulty protecting airway. Intubated 2/7. Pt was successfully extubated 2/16 and then reintubated 2/21.   Pt was re-intubated and sedated at time of visit.   Reassessed nutritional needs. Ve: 7.7 mL/hr and Tmax: 37.7   As medically able, recommend initiation of tube feeding of Vital AF 1.2 @ 65 mL/hr providing 1872 calories, 117 grams protein and 1264 mL free water  Per chart review pt's abdomen is soft and active and pt has mild edema.  Labs reviewed; K (3.4), Hemoglobin (8.8), CBG (89-135) Medications reviewed; IV antibiotics, 20 mg Pepcid, 40 mg Protonix, 1 tablet Senokot-S, 100 mg Thiamine, 50 mg IV Versed  Diet Order:  Diet NPO time specified  Skin:  Wound (see comment) (Stage II pressure injury to sacrum and to ear)  Last BM:  2/18  Height:   Ht Readings from Last 1 Encounters:  06/23/16 6\' 2"  (1.88 m)    Weight:   Wt Readings from Last 1 Encounters:  06/22/16 158 lb 8.2 oz (71.9 kg)    Ideal Body Weight:  86.36 kg  BMI:  Body mass  index is 20.35 kg/m.  Estimated Nutritional Needs:   Kcal:  1819  Protein:  105-115 grams (1.5-1.6 g/kg)  Fluid:  >/= 2 L/d  EDUCATION NEEDS:   No education needs identified at this time  [employer] Dietetic Intern

## 2016-06-23 NOTE — Progress Notes (Signed)
Subjective: Events of last night noted. Chest x-ray which I personally reviewed shows infiltrate in the left base now I suspect he aspirated which set off the events. He apparently was air hungry and looked like he simply could not get her breath so I wonder if his paralysis has ascended and now has affected his diaphragm. No other new complaints. He is still on 100% oxygen and blood gas marginal earlier this morning so we'll repeat at about noon  Objective: Vital signs in last 24 hours: Temp:  [97.9 F (36.6 C)-99.8 F (37.7 C)] 99.8 F (37.7 C) (02/21 0731) Pulse Rate:  [104-122] 118 (02/21 0400) Resp:  [0-35] 5 (02/21 0731) BP: (113-163)/(70-96) 163/95 (02/21 0600) SpO2:  [86 %-100 %] 100 % (02/21 0400) FiO2 (%):  [100 %] 100 % (02/21 0400) Weight change:  Last BM Date: 06/20/16  Intake/Output from previous day: 02/20 0701 - 02/21 0700 In: 2158.3 [I.V.:1658.3; IV Piggyback:500] Out: 1350 [Urine:1350]  PHYSICAL EXAM General appearance: Intubated sedated on mechanical ventilator Resp: He has significantly more rhonchi left greater than right than yesterday Cardio: regular rate and rhythm, S1, S2 normal, no murmur, click, rub or gallop GI: soft, non-tender; bowel sounds normal; no masses,  no organomegaly Extremities: extremities normal, atraumatic, no cyanosis or edema Skin warm and dry.  Lab Results:  Results for orders placed or performed during the hospital encounter of 06/03/16 (from the past 48 hour(s))  Blood gas, arterial     Status: Abnormal   Collection Time: 06/21/16  9:00 AM  Result Value Ref Range   O2 Content 2.0 L/min   Delivery systems NASAL CANNULA    pH, Arterial 7.431 7.350 - 7.450   pCO2 arterial 46.1 32.0 - 48.0 mmHg   pO2, Arterial 74.9 (L) 83.0 - 108.0 mmHg   Bicarbonate 29.4 (H) 20.0 - 28.0 mmol/L   Acid-Base Excess 5.9 (H) 0.0 - 2.0 mmol/L   O2 Saturation 94.5 %   Collection site LEFT RADIAL    Drawn by [phone]    Sample type ARTERIAL    Allens  test (pass/fail) PASS PASS  Glucose, capillary     Status: Abnormal   Collection Time: 06/21/16 12:18 PM  Result Value Ref Range   Glucose-Capillary 110 (H) 65 - 99 mg/dL  Glucose, capillary     Status: Abnormal   Collection Time: 06/21/16  7:58 PM  Result Value Ref Range   Glucose-Capillary 100 (H) 65 - 99 mg/dL   Comment 1 Notify RN    Comment 2 Document in Chart   Glucose, capillary     Status: Abnormal   Collection Time: 06/21/16 11:54 PM  Result Value Ref Range   Glucose-Capillary 112 (H) 65 - 99 mg/dL   Comment 1 Notify RN    Comment 2 Document in Chart   Glucose, capillary     Status: Abnormal   Collection Time: 06/22/16  3:06 AM  Result Value Ref Range   Glucose-Capillary 103 (H) 65 - 99 mg/dL   Comment 1 Notify RN    Comment 2 Document in Chart   Glucose, capillary     Status: None   Collection Time: 06/22/16  7:23 AM  Result Value Ref Range   Glucose-Capillary 99 65 - 99 mg/dL  Glucose, capillary     Status: Abnormal   Collection Time: 06/22/16 11:24 AM  Result Value Ref Range   Glucose-Capillary 135 (H) 65 - 99 mg/dL  Basic metabolic panel     Status: Abnormal   Collection Time:  06/22/16 12:08 PM  Result Value Ref Range   Sodium 140 135 - 145 mmol/L   Potassium 3.3 (L) 3.5 - 5.1 mmol/L   Chloride 101 101 - 111 mmol/L   CO2 31 22 - 32 mmol/L   Glucose, Bld 141 (H) 65 - 99 mg/dL   BUN 16 6 - 20 mg/dL   Creatinine, Ser 0.65 0.61 - 1.24 mg/dL   Calcium 8.7 (L) 8.9 - 10.3 mg/dL   GFR calc non Af Amer >60 >60 mL/min   GFR calc Af Amer >60 >60 mL/min    Comment: (NOTE) The eGFR has been calculated using the CKD EPI equation. This calculation has not been validated in all clinical situations. eGFR's persistently <60 mL/min signify possible Chronic Kidney Disease.    Anion gap 8 5 - 15  Magnesium     Status: None   Collection Time: 06/22/16 12:08 PM  Result Value Ref Range   Magnesium 1.9 1.7 - 2.4 mg/dL  Phosphorus     Status: None   Collection Time:  06/22/16 12:08 PM  Result Value Ref Range   Phosphorus 3.1 2.5 - 4.6 mg/dL  Glucose, capillary     Status: Abnormal   Collection Time: 06/22/16  4:17 PM  Result Value Ref Range   Glucose-Capillary 103 (H) 65 - 99 mg/dL  Glucose, capillary     Status: None   Collection Time: 06/22/16  8:09 PM  Result Value Ref Range   Glucose-Capillary 95 65 - 99 mg/dL  Glucose, capillary     Status: Abnormal   Collection Time: 06/23/16 12:09 AM  Result Value Ref Range   Glucose-Capillary 110 (H) 65 - 99 mg/dL  Blood gas, arterial     Status: Abnormal   Collection Time: 06/23/16  4:15 AM  Result Value Ref Range   FIO2 100.00    Delivery systems VENTILATOR    Mode PRESSURE REGULATED VOLUME CONTROL    VT 550 mL   LHR 14.0 resp/min   Peep/cpap 5.0 cm H20   pH, Arterial 7.421 7.350 - 7.450   pCO2 arterial 42.4 32.0 - 48.0 mmHg   pO2, Arterial 70.4 (L) 83.0 - 108.0 mmHg   Bicarbonate 26.7 20.0 - 28.0 mmol/L   Acid-Base Excess 2.9 (H) 0.0 - 2.0 mmol/L   O2 Saturation 93.4 %   Patient temperature 37.0    Collection site LEFT RADIAL    Drawn by [phone]    Sample type ARTERIAL    Allens test (pass/fail) PASS PASS  Basic metabolic panel     Status: Abnormal   Collection Time: 06/23/16  4:36 AM  Result Value Ref Range   Sodium 143 135 - 145 mmol/L   Potassium 3.4 (L) 3.5 - 5.1 mmol/L   Chloride 108 101 - 111 mmol/L   CO2 28 22 - 32 mmol/L   Glucose, Bld 98 65 - 99 mg/dL   BUN 15 6 - 20 mg/dL   Creatinine, Ser 0.68 0.61 - 1.24 mg/dL   Calcium 8.1 (L) 8.9 - 10.3 mg/dL   GFR calc non Af Amer >60 >60 mL/min   GFR calc Af Amer >60 >60 mL/min    Comment: (NOTE) The eGFR has been calculated using the CKD EPI equation. This calculation has not been validated in all clinical situations. eGFR's persistently <60 mL/min signify possible Chronic Kidney Disease.    Anion gap 7 5 - 15  CBC     Status: Abnormal   Collection Time: 06/23/16  4:36 AM  Result Value  Ref Range   WBC 7.2 4.0 - 10.5 K/uL   RBC  2.75 (L) 4.22 - 5.81 MIL/uL   Hemoglobin 8.8 (L) 13.0 - 17.0 g/dL   HCT 27.3 (L) 39.0 - 52.0 %   MCV 99.3 78.0 - 100.0 fL   MCH 32.0 26.0 - 34.0 pg   MCHC 32.2 30.0 - 36.0 g/dL   RDW 14.9 11.5 - 15.5 %   Platelets 420 (H) 150 - 400 K/uL  Magnesium     Status: None   Collection Time: 06/23/16  4:36 AM  Result Value Ref Range   Magnesium 1.7 1.7 - 2.4 mg/dL  Phosphorus     Status: Abnormal   Collection Time: 06/23/16  4:36 AM  Result Value Ref Range   Phosphorus 2.4 (L) 2.5 - 4.6 mg/dL  Glucose, capillary     Status: None   Collection Time: 06/23/16  5:28 AM  Result Value Ref Range   Glucose-Capillary 96 65 - 99 mg/dL  Glucose, capillary     Status: None   Collection Time: 06/23/16  7:29 AM  Result Value Ref Range   Glucose-Capillary 89 65 - 99 mg/dL    ABGS  Recent Labs  06/23/16 0415  PHART 7.421  PO2ART 70.4*  HCO3 26.7   CULTURES No results found for this or any previous visit (from the past 240 hour(s)). Studies/Results: Dg Chest 1 View  Result Date: 06/23/2016 CLINICAL DATA:  ET tube placement EXAM: CHEST 1 VIEW COMPARISON:  06/21/2016 FINDINGS: Endotracheal tube tip is approximately 5.6 cm superior to the carina. Esophageal tube tip appears to terminate at the distal esophagus. Right upper extremity catheter tip overlies the SVC. A right lung is grossly clear. Interval consolidation of left lung base with tiny left effusion. Stable heart size. IMPRESSION: 1. Endotracheal tube tip 5.6 cm superior to carina 2. Esophageal tube tip appears to terminate at the level of distal esophagus, recommend further advancement for more optimal positioning 3. Interval consolidation of the left lung base with presence of tiny left effusion Electronically Signed   By: Donavan Foil M.D.   On: 06/23/2016 03:22   Mr Jodene Nam Head Wo Contrast  Result Date: 06/21/2016 CLINICAL DATA:  Left facial droop. EXAM: MRI HEAD WITHOUT CONTRAST MRA HEAD WITHOUT CONTRAST TECHNIQUE: Multiplanar, multiecho  pulse sequences of the brain and surrounding structures were obtained without intravenous contrast. Angiographic images of the head were obtained using MRA technique without contrast. COMPARISON:  Brain MRI 06/06/2016 FINDINGS: MRI HEAD FINDINGS Axial and coronal T2 sequences are severely motion degraded. There is mild motion artifact on other sequences. Brain: There is no evidence of acute infarct, intracranial hemorrhage, mass, midline shift, or extra-axial fluid collection. Mild generalized cerebral atrophy is within normal limits for age. No significant cerebral white matter disease is seen for age. Vascular: Major intracranial vascular flow voids not well assessed due to motion. Skull and upper cervical spine: No gross osseous lesion. Sinuses/Orbits: No gross abnormality. Other: None. MRA HEAD FINDINGS The visualized distal vertebral arteries are widely patent and codominant. The basilar artery is widely patent. Posterior communicating arteries are diminutive or absent. PCAs are patent without evidence of significant stenosis. Internal carotid arteries are patent from skullbase to carotid termini without evidence of significant stenosis. ACAs and MCAs are patent without evidence of major branch occlusion or significant proximal stenosis allowing for mild motion artifact. No intracranial aneurysm is identified. IMPRESSION: 1. Motion degraded examination without evidence of acute intracranial abnormality. 2. Negative head MRA. Electronically Signed  By: Logan Bores M.D.   On: 06/21/2016 12:12   Mr Brain Wo Contrast  Result Date: 06/21/2016 CLINICAL DATA:  Left facial droop. EXAM: MRI HEAD WITHOUT CONTRAST MRA HEAD WITHOUT CONTRAST TECHNIQUE: Multiplanar, multiecho pulse sequences of the brain and surrounding structures were obtained without intravenous contrast. Angiographic images of the head were obtained using MRA technique without contrast. COMPARISON:  Brain MRI 06/06/2016 FINDINGS: MRI HEAD FINDINGS  Axial and coronal T2 sequences are severely motion degraded. There is mild motion artifact on other sequences. Brain: There is no evidence of acute infarct, intracranial hemorrhage, mass, midline shift, or extra-axial fluid collection. Mild generalized cerebral atrophy is within normal limits for age. No significant cerebral white matter disease is seen for age. Vascular: Major intracranial vascular flow voids not well assessed due to motion. Skull and upper cervical spine: No gross osseous lesion. Sinuses/Orbits: No gross abnormality. Other: None. MRA HEAD FINDINGS The visualized distal vertebral arteries are widely patent and codominant. The basilar artery is widely patent. Posterior communicating arteries are diminutive or absent. PCAs are patent without evidence of significant stenosis. Internal carotid arteries are patent from skullbase to carotid termini without evidence of significant stenosis. ACAs and MCAs are patent without evidence of major branch occlusion or significant proximal stenosis allowing for mild motion artifact. No intracranial aneurysm is identified. IMPRESSION: 1. Motion degraded examination without evidence of acute intracranial abnormality. 2. Negative head MRA. Electronically Signed   By: Logan Bores M.D.   On: 06/21/2016 12:12   Dg Chest Port 1 View  Result Date: 06/21/2016 CLINICAL DATA:  Altered mental status EXAM: PORTABLE CHEST 1 VIEW COMPARISON:  June 16, 2016 FINDINGS: Central catheter tip is at the cavoatrial junction. Endotracheal tube and nasogastric tube have been removed. No pneumothorax. No evident edema or consolidation. Heart is upper normal in size with pulmonary vascularity within normal limits. There is atherosclerotic calcification in the aorta. No adenopathy. There is postoperative change in the lower thoracic and visualized upper lumbar regions. IMPRESSION: No edema or consolidation. Stable cardiac silhouette. Aortic atherosclerosis. No pneumothorax.  Electronically Signed   By: Lowella Grip III M.D.   On: 06/21/2016 09:26    Medications:  Prior to Admission:  Prescriptions Prior to Admission  Medication Sig Dispense Refill Last Dose  . aspirin EC 81 MG tablet Take 81 mg by mouth daily.   Past Week at Unknown time  . benazepril (LOTENSIN) 40 MG tablet Take 40 mg by mouth daily.   Past Week at Unknown time  . cyclobenzaprine (FLEXERIL) 10 MG tablet Take 1 tablet (10 mg total) by mouth 3 (three) times daily as needed for muscle spasms. 30 tablet 0 Past Week at Unknown time  . diazepam (VALIUM) 10 MG tablet Take 0.5 tablets (5 mg total) by mouth every 6 (six) hours as needed for anxiety (back spasms). (Patient taking differently: Take 10 mg by mouth 4 (four) times daily as needed for anxiety (back spasms). ) 30 tablet 0 Past Week at Unknown time  . doxycycline (VIBRA-TABS) 100 MG tablet Take 1 tablet (100 mg total) by mouth 2 (two) times daily. 60 tablet 11 Past Week at Unknown time  . fluticasone (FLONASE) 50 MCG/ACT nasal spray Place 1 spray into the nose daily as needed for allergies.    unknown  . NEOMYCIN-POLYMYXIN-HYDROCORTISONE (CORTISPORIN) 1 % SOLN otic solution Place 2 drops in ear(s) daily. Reported on 11/12/2015   Past Week at Unknown time  . pregabalin (LYRICA) 75 MG capsule Take 75 mg by  mouth 2 (two) times daily.    Past Week at Unknown time  . tamsulosin (FLOMAX) 0.4 MG CAPS capsule Take 0.4 mg by mouth.   Past Week at Unknown time   Scheduled: . chlorhexidine gluconate (MEDLINE KIT)  15 mL Mouth Rinse BID  . Chlorhexidine Gluconate Cloth  6 each Topical Daily  . clindamycin (CLEOCIN) IV  600 mg Intravenous Q8H  . doxycycline (VIBRAMYCIN) IV  100 mg Intravenous Q12H  . enoxaparin (LOVENOX) injection  40 mg Subcutaneous Q24H  . famotidine  20 mg Oral Daily  . mouth rinse  15 mL Mouth Rinse BID  . pantoprazole (PROTONIX) IV  40 mg Intravenous Q24H  . polyvinyl alcohol  1 drop Both Eyes TID  . senna-docusate  1 tablet Oral  QHS  . sodium chloride flush  10-40 mL Intracatheter Q12H  . tamsulosin  0.4 mg Oral QPC supper  . thiamine  100 mg Oral Daily   Continuous: . 0.9 % NaCl with KCl 40 mEq / L 125 mL/hr (06/23/16 0233)  . midazolam (VERSED) infusion 0.5 mg/hr (06/23/16 0426)   FMM:[id number] **OR** acetaminophen, bisacodyl, fentaNYL (SUBLIMAZE) injection, haloperidol lactate, LORazepam, ondansetron **OR** ondansetron (ZOFRAN) IV, sodium chloride flush  Assesment: He has recurrent respiratory failure requiring ventilator support. It appears that he aspirated but he also had significant difficulty ventilating suspicious for increased problems with ascending paralysis. He will now likely require tracheostomy and I suspect feeding tube. Principal Problem:   Miller-Fisher variant Guillain-Barre syndrome (HCC) Active Problems:   HTN (hypertension)   Long term current use of antibiotics   Back pain, chronic   Discitis of lumbar region   Nerve disease, peripheral (HCC)   Neurogenic urinary bladder disorder   Ataxia   Muscle weakness (generalized)   Ataxic gait   Malnutrition of moderate degree   Palliative care encounter   Goals of care, counseling/discussion   Pressure injury of skin    Plan: I added clindamycin since this is likely aspiration.    LOS: 20 days   Humza Tallerico L 06/23/2016, 7:41 AM

## 2016-06-23 NOTE — Progress Notes (Signed)
eLink Physician-Brief Progress Note Patient Name: Marc Rose DOB: 1950/11/02 MRN: [id number]   Date of Service  06/23/2016  HPI/Events of Note  Pt agitated.  eICU Interventions  Will adjust sedation orders.     Intervention Category Major Interventions: Other:  Ragena Fiola 06/23/2016, 8:16 PM

## 2016-06-23 NOTE — Progress Notes (Signed)
PROGRESS NOTE  Marc Rose:[id number] DOB: 01-20-1951 DOA: 06/03/2016 PCP: [name] Patient  Brief Narrative: 66 year old man multiple medical problems presented with worsening bilateral lower extremity weakness resulting in multiple falls and traumatic skin tears on the feet and knees, also reported severe abdominal pain with fullness sensation in lower abdomen. Admitted for UTI, acute kidney injury, generalized weakness. He was evaluated by therapy who recommended skilled nursing facility. 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 He was treated with IVIG. Approximately 1 week into the hospitalization he developed acute encephalopathy with decreased mentation, was transferred to the stepdown unit and started on Precedex. He quickly decompensated and was intubated 2/7. He subsequently developed hypotension requiring vasopressor support, likely etiology being autonomic dysfunction. He was successfully extubated 2/16. For the following 48 hours diet was advanced and he did relatively well. 2/19 developed acute encephalopathy, decreased responsiveness culminating into reintubation on 2/21. Investigation including MRI brain, ABG and chest x-ray were unremarkable. He will eventually need LTAC placement.  Assessment/Plan 1. Acute encephalopathy, persistent. Etiology unclear. Chart reviewed in detail. The patient initially decompensated 2/6 and was subsequently intubated. Status post extubation 2/16 the patient did well from a mentation standpoint until the evening of 2/13. MRI of the brain, ABG and chest x-ray were unremarkable. There is no evidence of infection or metabolic derangement. Lyrica had been restarted status post extubation, however he was on this medication prior to admission and on admission for week, therefore doubt this. No other new medications that would be likely. As noted by neurology, he may simply  be at the nadir of his clinical course.  Supportive care at this point, workup as above negative. Will discontinue Lyrica and scheduled Haldol and quetiapine. Monitor respiratory status closely.  Consider repeat EEG 2/21 in clinical condition fails to improve.  2. Marc Rose variant of GBS, status post IVIG treatment. Associated left facial weakness. Profound generalized weakness. His worsening overall condition may be related to progression of his neurologic illness. Will likely need LTAC placement once stable..  3. Dysphagia.   Start nutrition by oral gastric tube. Will eventually need PEG tube.  4. Acute hypoxic respiratory failure requiring ventilator support. Patient was intubated from 2/7 - 2/16. He was extubated but then became increasingly lethargic and went back into respiratory failure requiring reintubation on 2/21. Discussed with Dr. Luan Pulling and he will likely need tracheostomy. 5. Hypotension requiring vasopressor support. Resolved. Suspect autonomic dysfunction. No evidence of sepsis. Echocardiogram was reassuring. 6. Acute kidney injury secondary to prerenal azotemia, resolved. 7. Hyponatremia secondary to hypovolemic status. Resolved. 8. PMH multiple lumbar/spinal surgeries, chronic lumbar discitis and osteomyelitis on suppressive therapy with doxycycline, urinary retention requiring self bladder catheterization since 2014  9. Chronic lower abdominal pain. Stable. CT abdomen and pelvis no acute finding. Protonix for esophagitis. 10. 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 Reported progressive spasticity arms and legs. 11. Peripheral arterial disease. Stable. 12. Status post enterococcus UTI associated with outpatient in/out self bladder catheterizations 13. Moderate malnutrition 14. Manubrial fracture seen on chest CT. Discussed with thoracic surgery Dr. Servando Snare. Recommended activity as  tolerated, no heavy lifting, follow-up chest x-ray with sternal view as an outpatient. No other follow-up recommended at this point. 15. Right upper lobe nodule. Consider chest CT in 12 months. 16. Aortic atherosclerosis 17. Prediabetes. No need for sliding scale given stability of blood sugars.  DVT  proph: Lovenox Code Status: full code Family Communication: As above Disposition Plan: pending   [name], MD  [facility] Direct contact: [phone] --[address] [place]  --[email or web address]; password [email or web address]  7PM-7AM contact night coverage as above 06/23/2016, 2:24 PM  LOS: 20 days   Consultants:  Neurology  Pulmonology  Procedures:  Intubation 2/7>> 2/16, 2/21>>  Lumbar puncture 2/8  EEG IMPRESSION: This is a normal recording of the awake and drowsy states.  Echo Study Conclusions  - Left ventricle: The cavity size was normal. Wall thickness was   increased in a pattern of mild LVH. Systolic function was normal.   The estimated ejection fraction was in the range of 60% to 65%.   Doppler parameters are consistent with abnormal left ventricular   relaxation (grade 1 diastolic dysfunction). - Aortic valve: Calcified non coronary cusp. Valve area (VTI): 1.83   cm^2. Valve area (Vmax): 1.83 cm^2. Valve area (Vmean): 1.84   cm^2. - Atrial septum: No defect or patent foramen ovale was identified. - Pulmonary arteries: PA peak pressure: 39 mm Hg (S). Antimicrobials:  Clindamycin 2/21>>  Interval history/Subjective: Events overnight noted. He is intubated and sedated at this time..   Objective: Vitals:   06/23/16 0752 06/23/16 0800 06/23/16 1106 06/23/16 1117  BP:  132/75 (!) 156/86   Pulse:    97  Resp:  (!) 0  10  Temp:    98.8 F (37.1 C)  TempSrc:    Axillary  SpO2: 100%   97%  Weight:      Height:        Intake/Output Summary (Last 24 hours) at 06/23/16 1424 Last data filed at 06/23/16 1400  Gross per 24 hour  Intake          3738.77 ml  Output              2150 ml  Net          1588.77 ml     Filed Weights   06/20/16 0500 06/21/16 0400 06/22/16 0500  Weight: 71.9 kg (158 lb 8.2 oz) 69.9 kg (154 lb 1.6 oz) 71.9 kg (158 lb 8.2 oz)    Exam:    Constitutional. Intubated and sedated  Eyes. Pupils, irises, lids appear unremarkable.  ENT. Lips appear unremarkable, ETT in place  Cardiovascular. RRR. No murmur, rub or gallop. No lower extremity edema.   Respiratory.Clear bilaterally. No frank wheezes, rales or rhonchi. Normal respiratory effort.  Musculoskeletal. No cyanosis or clubbing  Psychiatric. Unable to assess. Sedated  Skin. No change in exam. Multiple abrasions bilateral lower extremities.     Scheduled Meds: . chlorhexidine gluconate (MEDLINE KIT)  15 mL Mouth Rinse BID  . Chlorhexidine Gluconate Cloth  6 each Topical Daily  . clindamycin (CLEOCIN) IV  600 mg Intravenous Q8H  . doxycycline (VIBRAMYCIN) IV  100 mg Intravenous Q12H  . enoxaparin (LOVENOX) injection  40 mg Subcutaneous Q24H  . famotidine  20 mg Oral Daily  . mouth rinse  15 mL Mouth Rinse BID  . pantoprazole (PROTONIX) IV  40 mg Intravenous Q24H  . polyvinyl alcohol  1 drop Both Eyes TID  . senna-docusate  1 tablet Oral QHS  . sodium chloride flush  10-40 mL Intracatheter Q12H  . tamsulosin  0.4 mg Oral QPC supper  . thiamine  100 mg Oral Daily   Continuous Infusions: . 0.9 % NaCl with KCl 40 mEq / L 125 mL/hr (06/23/16 1308)  . midazolam (VERSED) infusion 2 mg/hr (06/23/16 1400)  Principal Problem:   Miller-Fisher variant Guillain-Barre syndrome (HCC) Active Problems:   HTN (hypertension)   Long term current use of antibiotics   Back pain, chronic   Discitis of lumbar region   Nerve disease, peripheral (HCC)   Neurogenic urinary bladder disorder   Ataxia   Muscle weakness (generalized)   Ataxic gait   Malnutrition of moderate degree   Palliative care encounter   Goals of care, counseling/discussion   Pressure injury of  skin   LOS: 20 days

## 2016-06-23 NOTE — Progress Notes (Signed)
eLink Physician-Brief Progress Note Patient Name: Marc Rose DOB: 06/02/1950 MRN: [id number]   Date of Service  06/23/2016  HPI/Events of Note  Now intubated and ventilated. Already on stress ulcer and DVT prophylaxis. CXR with ETT tip 6 cm above the carina. BP = 78/61.  eICU Interventions  Will order: 1. Mechanical Ventilation: 100%/PRVC 14/TV 550/P 5. 2. ABG at 4:15 AM 3. Bolus with 0.9 NaCl 1 liter IV over 1 hour now.  4. Fentanyl 25-100 mcg IV Q 3 hours PRN sedation. RASS goal = 0.      Intervention Category Major Interventions: Respiratory failure - evaluation and management  Sommer,Steven Eugene 06/23/2016, 3:30 AM

## 2016-06-23 NOTE — Progress Notes (Signed)
OT Cancellation Note  Patient Details Name: Marc Rose MRN: [id number] DOB: 1950/05/25   Cancelled Treatment:    Reason Eval/Treat Not Completed: Medical issues which prohibited therapy. Earlier this morning a Code Blue was called and patient was intubated. At this time, therapy will allow patient to sleep. Will treat at a later time as able.     Ailene Ravel, OTR/L,CBIS  [phone]  06/23/2016, 9:00 AM

## 2016-06-23 NOTE — Progress Notes (Signed)
eLink Physician-Brief Progress Note Patient Name: Marc Rose DOB: 09-27-1950 MRN: [id number]   Date of Service  06/23/2016  HPI/Events of Note  Hypoxia - Patient "[name]" according to bedside nurse. Sat = 89% and RR = 31.   eICU Interventions  Patient needs to be intubated and mechanically ventilated. ED physician or anesthesia to be paged to intubate patient. eLink will be happy to write ventilator orders.      Intervention Category Major Interventions: Respiratory failure - evaluation and management  Marc Rose 06/23/2016, 2:45 AM

## 2016-06-23 NOTE — Progress Notes (Signed)
PT Cancellation Note  Patient Details Name: Marc Rose MRN: [id number] DOB: [birth year]   Cancelled Treatment:    Reason Eval/Treat Not Completed: Patient not medically ready (Hold PT at this time as pt was re-intubated this AM due to respiratory failure, progressively increased work of breathing and Guillain-Barr.  Will check back later to determine if pt is appropriate for skilled therapeutic intervention at this time. )   Beth Yamir Carignan, PT, DPT X: [phone]

## 2016-06-23 NOTE — Progress Notes (Signed)
Daily Progress Note   Patient Name: Marc Rose       Date: 06/23/2016 DOB: May 01, 1951  Age: 66 y.o. MRN#: [id number] Attending Physician: [name], MD Primary Care Physician: [name] Patient Admit Date: 06/03/2016  Reason for Consultation/Follow-up: Disposition, Establishing goals of care and Psychosocial/spiritual support  Subjective: Marc Rose is resting quietly in bed. He was intubated/ventilated overnight. He is sedated at this time. No family at bedside at this time.  Marc Rose, at bedside, updated regarding change in condition.  Call to Marc, Marc Rose. We talk about her brothers decline, the possibility of paralysis ascending to diaphragm, the possibility of aspiration. Marc Rose states that she understands and accepts that Marc Rose will need a trach and PEG. She gives her verbal consent.   We talk about long-term acute care services. Marc Rose states that she was pleased with [facility], and is agreeable for him to have disposition there for long-term acute care/rehab. Marc Rose states that she thought/was hopeful that her brother was on a road to recovery, She asks, "Will he ever be able to live on his own?" I share that it remains to be seen, but this may be possible after 6 months to a year. I also share that he may get asuper infection and not be able to overcome, but we are doing all we can for his recovery.     Marc Rose states she will call her daughter-in-law (who works in a bank) to ask advice on how to handle his affairs. I suggest that she call [facility], she may need to provide information that states Marc Rose is unable to manage his own affairs. She may be awarded temporary guardianship to manage his legal and financial affairs.  Marc Rose states her she and her husband Marc Rose may return to [place] sooner than they had anticipated.  I encourage Marc Rose to call [facility] at any time.   I shared with Marc Rose that Marc Rose is allowed to receive information, but she is not responsible for making healthcare decisions. Marc Rose agrees.   Phone confernece with Marc Rose who agrees to [facility]. Conference with Marc Rose.   Length of Stay: 20  Current Medications: Scheduled Meds:  . chlorhexidine gluconate (MEDLINE KIT)  15 mL Mouth Rinse BID  . Chlorhexidine Gluconate Cloth  6 each  Topical Daily  . clindamycin (CLEOCIN) IV  600 mg Intravenous Q8H  . doxycycline (VIBRAMYCIN) IV  100 mg Intravenous Q12H  . enoxaparin (LOVENOX) injection  40 mg Subcutaneous Q24H  . famotidine  20 mg Oral Daily  . mouth rinse  15 mL Mouth Rinse BID  . pantoprazole (PROTONIX) IV  40 mg Intravenous Q24H  . polyvinyl alcohol  1 drop Both Eyes TID  . senna-docusate  1 tablet Oral QHS  . sodium chloride flush  10-40 mL Intracatheter Q12H  . tamsulosin  0.4 mg Oral QPC supper  . thiamine  100 mg Oral Daily    Continuous Infusions: . 0.9 % NaCl with KCl 40 mEq / L 125 mL/hr at 06/23/16 0800  . midazolam (VERSED) infusion 1 mg/hr (06/23/16 0832)    PRN Meds: acetaminophen **OR** acetaminophen, bisacodyl, fentaNYL (SUBLIMAZE) injection, haloperidol lactate, LORazepam, ondansetron **OR** ondansetron (ZOFRAN) IV, sodium chloride flush  Physical Exam  Constitutional: No distress.  Re-intubated, sedated.   HENT:  Head: Normocephalic and atraumatic.  Cardiovascular: Normal rate and regular rhythm.   Pulmonary/Chest:  Intubated/ventilated.   Abdominal: Soft. He exhibits no distension.  Musculoskeletal: He exhibits no edema.  Neurological:  Intubated/sedated.   Skin: Skin is warm and dry.  Multiple areas of bruising  Nursing note and vitals reviewed.           Vital Signs: BP (!) 156/86   Pulse 97   Temp 98.8 F (37.1 C) (Axillary)   Resp 10   Ht 6'  2" (1.88 m)   Wt 71.9 kg (158 lb 8.2 oz)   SpO2 97%   BMI 20.35 kg/m  SpO2: SpO2: 97 % O2 Device: O2 Device: Ventilator O2 Flow Rate: O2 Flow Rate (L/min): 15 L/min  Intake/output summary:  Intake/Output Summary (Last 24 hours) at 06/23/16 1252 Last data filed at 06/23/16 1014  Gross per 24 hour  Intake          3091.63 ml  Output             2150 ml  Net           941.63 ml   LBM: Last BM Date: 06/20/16 Baseline Weight: Weight: 74.8 kg (165 lb) Most recent weight: Weight: 71.9 kg (158 lb 8.2 oz)       Palliative Assessment/Data:    Flowsheet Rows   Flowsheet Row Most Recent Value  Intake Tab  Referral Department  Hospitalist  Unit at Time of Referral  ICU  Palliative Care Primary Diagnosis  Neurology  Date Notified  06/17/16  Palliative Care Type  New Palliative care  Reason for referral  Clarify Goals of Care  Date of Admission  06/03/16  Date first seen by Palliative Care  06/17/16  # of days Palliative referral response time  0 Day(s)  # of days IP prior to Palliative referral  14  Clinical Assessment  Palliative Performance Scale Score  20%  Pain Max last 24 hours  Not able to report  Pain Min Last 24 hours  Not able to report  Dyspnea Max Last 24 Hours  Not able to report  Dyspnea Min Last 24 hours  Not able to report  Psychosocial & Spiritual Assessment  Palliative Care Outcomes  Patient/Family meeting held?  Yes  Who was at the meeting?  With Marc, Marc Rose, at bedside  Palliative Care Outcomes  Provided psychosocial or spiritual support  Palliative Care follow-up planned  -- [Follow-up while at [facility]]        Patient Active Problem List   Diagnosis Date Noted  . Pressure injury of skin 06/21/2016  . Palliative care encounter   . Goals of care, counseling/discussion   . Marc Rose ([place]) 06/10/2016  . Malnutrition of moderate degree 06/10/2016  . Ataxic gait   . Muscle weakness (generalized)   . Ataxia 06/05/2016    . 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 Drug rash 11/12/2015  . Hyperpigmentation 05/15/2015  . Abdominal pain, right lower quadrant 02/05/2015  . Abscess, psoas ([place]) 02/05/2015  . Acute upper respiratory infection 02/05/2015  . Long term current use of antibiotics 02/05/2015  . Benign essential HTN 02/05/2015  . Edema leg 02/05/2015  . Complex regional pain Rose type 2 of upper extremity 02/05/2015  . Back pain, chronic 02/05/2015  . CN (constipation) 02/05/2015  . CD (contact dermatitis) 02/05/2015  . Elevated fasting blood sugar 02/05/2015  . LBP (low back pain) 02/05/2015  . Discitis of lumbar region 02/05/2015  . Disorder of nutrition 02/05/2015  . Feeling bilious 02/05/2015  . Flu vaccine need 02/05/2015  . Peripheral neuropathic pain 02/05/2015  . Non compliance with medical treatment 02/05/2015  . Abnormal blood chemistry 02/05/2015  . Angiopathy, peripheral ([place]) 02/05/2015  . Nerve disease, peripheral ([place]) 02/05/2015  . Hypercholesterolemia without hypertriglyceridemia 02/05/2015  . Spinal stenosis 02/05/2015  . Buzzing in ear 02/05/2015  . Compulsive tobacco user Rose 02/05/2015  . Bladder retention 02/05/2015  . Abnormal weight loss 02/05/2015  . Diskitis 02/05/2015  . Hardware complicating wound infection ([place]) 02/05/2015  . Hyperlipidemia 07/05/2014  . Peripheral arterial disease ([place]) 07/05/2014  . Hypotension 09/16/2013  . HTN (hypertension) 09/16/2013  . Anxiety 09/16/2013  . Back pain 09/16/2013    Palliative Care Assessment & Plan   Patient Profile: 66 y.o.malewith past medical history of arthritis, benign neoplasm of colon, chronic back and leg pain, diabetes, depression, hypertension, PAD, tobacco use disorderadmitted on 2/1/2018with UTI, Marc Rose.  Intubated upon admission, extubated 2/16, reintubated 2/21.  Assessment: Marc Rose: Long recovery  anticipated. Completed 5 day course of IV IG. PT and OT. Usual course worsens for 4 weeks (likely at nadir of deterioration now)and then gradually improves over 3-4 months. Difficulty breathing, question if paralysis has ascended to the diaphragm, reintubated 2/21. Encephalopathy; unknown source, may simply be at the nadir of his clinical course. Question aspiration.  Recommendations/Plan:  Mr. Doane will need LTAC, and months of rehabilitation.   Goals of Care and Additional Recommendations:  Limitations on Scope of Treatment: Full Scope Treatment  Code Status:    Code Status Orders        Start     Ordered   06/03/16 1518  Full code  Continuous     06/03/16 1517    Code Status History    Date Active Date Inactive Code Status Order ID Comments User Context   09/16/2013 10:03 PM 09/19/2013  4:23 PM Full Code [id number]  Doree Albee, MD ED    Advance Directive Documentation   [place] Most Recent Value  Type of Advance Directive  Living will  Pre-existing out of facility DNR order (yellow form or pink MOST form)  No data  "MOST" Form in Place?  No data       Prognosis:   Unable to determine, based on outcomes  Discharge Planning:  Family is requesting [facility], LTAC for trach/PEG rehab.   Care plan was discussed with nursing staff, case manager, social worker, Marc Rose,  and Marc Rose.   Thank you for allowing the Palliative Medicine Team to assist in the care of this patient.   Time In: 1055 Time Out: 1145 Total Time 50 minutes Prolonged Time Billed  yes       Greater than 50%  of this time was spent counseling and coordinating care related to the above assessment and plan.  Drue Novel, NP  Please contact Palliative Medicine Team phone at [phone] for questions and concerns.

## 2016-06-23 NOTE — ED Provider Notes (Signed)
[facility]  Department of Emergency Medicine   Code Blue CONSULT NOTE  Chief Complaint: respiratory distress   Level V Caveat: respiratory distress  History of present illness: I was contacted by the ICU to intubate the patient. Patient was intubated recently secondary to respiratory failure and Guillain-Barr, was extubated 3 days ago. Today patient has had progressively increased work of breathing.  ROS: Unable to obtain, Level V caveat  Scheduled Meds: . chlorhexidine gluconate (MEDLINE KIT)  15 mL Mouth Rinse BID  . Chlorhexidine Gluconate Cloth  6 each Topical Daily  . doxycycline (VIBRAMYCIN) IV  100 mg Intravenous Q12H  . enoxaparin (LOVENOX) injection  40 mg Subcutaneous Q24H  . famotidine  20 mg Oral Daily  . mouth rinse  15 mL Mouth Rinse BID  . pantoprazole (PROTONIX) IV  40 mg Intravenous Q24H  . polyvinyl alcohol  1 drop Both Eyes TID  . senna-docusate  1 tablet Oral QHS  . sodium chloride  1,000 mL Intravenous Once  . sodium chloride flush  10-40 mL Intracatheter Q12H  . tamsulosin  0.4 mg Oral QPC supper  . thiamine  100 mg Oral Daily   Continuous Infusions: . 0.9 % NaCl with KCl 40 mEq / L 125 mL/hr (06/23/16 0233)  . midazolam (VERSED) infusion     PRN Meds:.acetaminophen **OR** acetaminophen, bisacodyl, fentaNYL (SUBLIMAZE) injection, haloperidol lactate, LORazepam, ondansetron **OR** ondansetron (ZOFRAN) IV, sodium chloride flush Past Medical History:  Diagnosis Date  . Anxiety state, unspecified   . Arthritis   . 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 Benign neoplasm of colon   . Causalgia of upper limb    Left limb  . Chronic back pain   . Chronic leg pain   . Depression   . Diabetes mellitus   . Diskitis 02/05/2015  . Drug rash 11/12/2015  . Elevated blood pressure reading without diagnosis of hypertension   . Essential hypertension, benign   . Hardware complicating wound infection ([place]) 02/05/2015  . Hyperpigmentation 05/15/2015  . Hypertension   .  Impaired fasting glucose   . Peripheral arterial disease ([place])   . Pure hypercholesterolemia   . Tobacco use disorder   . Unspecified retinal detachment    Past Surgical History:  Procedure Laterality Date  . ANKLE SURGERY     left  . APPENDECTOMY    . BACK SURGERY    . COLONOSCOPY    . KNEE SURGERY     left   Social History   Social History  . Marital status: Widowed    Spouse name: N/A  . Number of children: N/A  . Years of education: N/A   Occupational History  . Not on file.   Social History Main Topics  . Smoking status: Current Every Day Smoker    Packs/day: 0.50    Years: 39.00    Types: Cigarettes  . Smokeless tobacco: Never Used  . Alcohol use No     Comment: occasionally  . Drug use: Yes    Types: Marijuana  . Sexual activity: Not on file   Other Topics Concern  . Not on file   Social History Narrative  . No narrative on file   Allergies  Allergen Reactions  . Codeine Anaphylaxis, Hives and Swelling  . Penicillins Anaphylaxis, Hives and Swelling  . Latex Itching  . Strawberry Extract Hives    Last set of Vital Signs (not current) Vitals:   06/23/16 0300 06/23/16 0400  BP: 117/71 139/78  Pulse: (!) 116 (!)  118  Resp: (!) 7 15  Temp:        Physical Exam  Gen: Distressed Cardiovascular: Regular  Resp: Tachypnea, coarse breath sounds bilaterally Abd: nondistended  HEENT: No blood in posterior pharynx  Neck: No crepitus  Musculoskeletal: No deformity  Skin: warm  Procedures  INTUBATION Performed by: Joseph Berkshire J. Required items: required blood products, implants, devices, and special equipment available Patient identity confirmed: provided demographic data and hospital-assigned identification number Time out: Immediately prior to procedure a "time out" was called to verify the correct patient, procedure, equipment, support staff and site/side marked as required. Indications: Respiratory distress  Intubation method: Glide  scope Preoxygenation: BVM Sedatives: Etomidate 20 mg  Paralytic: Succinylcholine 100 mg  Tube Size: 7.5 cuffed Post-procedure assessment: chest rise and ETCO2 monitor Breath sounds: equal and absent over the epigastrium Tube secured by Respiratory Therapy Patient tolerated the procedure well with no immediate complications.  CRITICAL CARE Performed by: Orpah Greek Total critical care time: 30 Critical care time was exclusive of separately billable procedures and treating other patients. Critical care was necessary to treat or prevent imminent or life-threatening deterioration. Critical care was time spent personally by me on the following activities: development of treatment plan with patient and/or surrogate as well as nursing, discussions with consultants, evaluation of patient's response to treatment, examination of patient, obtaining history from patient or surrogate, ordering and performing treatments and interventions, ordering and review of laboratory studies, ordering and review of radiographic studies, pulse oximetry and re-evaluation of patient's condition.   Medical Decision making  Patient's respiratory distress indicated that he required intubation.  Assessment and Plan  Intubation of the patient was performed. Adequate endotracheal tube placement was seen on x-ray. Continued care by hospitalist service.   Orpah Greek, MD 06/23/16 [phone]

## 2016-06-23 NOTE — Progress Notes (Signed)
Marc A. [name], MD     [email or web address]          Marc Rose is an 66 y.o. male.   ASSESSMENT/PLAN:      Resolving Severe delirium/encephalopathy of unclear etiology at this time. We are suspecting that this may be multifactorial including UTI and medication effect from Levaquin. Levaquin has been discontinued. The picture seems most consistent with toxic metabolic encephalopathy/delirium as opposed to primary and CNS problem. The patient's spinal fluid analysis shows the typical formula with elevated protein and essentially unremarkable white cell count (cytoalbuminologic dissociation) seen in demyelinating conditions such as Guillain-Barr syndrome. The CSF does not show evidence of central nervous system infection.  I recommend that agitation be treated with when necessary doses of Haldol. I will increase his maintenance dose of Haldol. Will continue with the Seroquel. Around the clock haldol restarted.    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 Completed 5 day course of IV IG. PT and OT. Unfortunately seems to still getting worse neurologically and respirationally. 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 HX of chronic discitis            The patient unfortunately had a downturn with respiratory failure overnight. His neurological status is actually worsened since he was intubated a couple weeks ago. He has clear worsening of weakness of the upper and lower extremities and the left facial weakness. This indicates that he is still getting worse from the Guillain-Barr syndrome.      GENERAL: He is intubated.   HEENT: Supple. Atraumatic normocephalic. Dense cataracts both sides.  ABDOMEN:  soft  EXTREMITIES: No edema. Multiple abrasions and lacerations involving the knees and legs.   BACK: Normal.  SKIN: Normal by inspection.    MENTAL STATUS: He is intubated and sedated. There is minimal response as the patient  is on significant sedation.   CRANIAL NERVES:  Coronary reflexes are absent. oculocephalic reflexes are present. Pupils are reactive.    MOTOR: There is some pain involving the extremities to deep painful stimuli graded as 2/5.  COORDINATION: Very ataxic - trunk and appendiular  REFLEXES: The patient is areflexic in the legs and upper extremities.    EEG normal  Blood pressure 105/70, pulse (!) 101, temperature 98.6 F (37 C), temperature source Axillary, resp. rate 18, height '6\' 2"'  (1.88 m), weight 158 lb 8.2 oz (71.9 kg), SpO2 93 %.  Past Medical History:  Diagnosis Date  . Anxiety state, unspecified   . Arthritis   . 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 Benign neoplasm of colon   . Causalgia of upper limb    Left limb  . Chronic back pain   . Chronic leg pain   . Depression   . Diabetes mellitus   . Diskitis 02/05/2015  . Drug rash 11/12/2015  . Elevated blood pressure reading without diagnosis of hypertension   . Essential hypertension, benign   . Hardware complicating wound infection ([place]) 02/05/2015  . Hyperpigmentation 05/15/2015  . Hypertension   . Impaired fasting glucose   . Peripheral arterial disease ([place])   . Pure hypercholesterolemia   . Tobacco use disorder   . Unspecified retinal detachment     Past Surgical History:  Procedure Laterality Date  . ANKLE SURGERY  left  . APPENDECTOMY    . BACK SURGERY    . COLONOSCOPY    . KNEE SURGERY     left    Family History  Problem Relation Age of Onset  . COPD Mother   . Hyperthyroidism Mother   . Hyperthyroidism Sister     Social History:  reports that he has been smoking Cigarettes.  He has a 19.50 pack-year smoking history. He has never used smokeless tobacco. He reports that he uses  drugs, including Marijuana. He reports that he does not drink alcohol.  Allergies:  Allergies  Allergen Reactions  . Codeine Anaphylaxis, Hives and Swelling  . Penicillins Anaphylaxis, Hives and Swelling  . Latex Itching  . Strawberry Extract Hives    Medications: Prior to Admission medications   Medication Sig Start Date End Date Taking? Authorizing Provider  aspirin EC 81 MG tablet Take 81 mg by mouth daily.   Yes Historical Provider, MD  benazepril (LOTENSIN) 40 MG tablet Take 40 mg by mouth daily.   Yes Historical Provider, MD  cyclobenzaprine (FLEXERIL) 10 MG tablet Take 1 tablet (10 mg total) by mouth 3 (three) times daily as needed for muscle spasms. 09/19/13  Yes Adeline Saralyn Pilar, MD  diazepam (VALIUM) 10 MG tablet Take 0.5 tablets (5 mg total) by mouth every 6 (six) hours as needed for anxiety (back spasms). Patient taking differently: Take 10 mg by mouth 4 (four) times daily as needed for anxiety (back spasms).  09/19/13  Yes Adeline Saralyn Pilar, MD  doxycycline (VIBRA-TABS) 100 MG tablet Take 1 tablet (100 mg total) by mouth 2 (two) times daily. 11/27/15  Yes Truman Hayward, MD  fluticasone [facility]) 50 MCG/ACT nasal spray Place 1 spray into the nose daily as needed for allergies.  07/04/14  Yes Historical Provider, MD  NEOMYCIN-POLYMYXIN-HYDROCORTISONE (CORTISPORIN) 1 % SOLN otic solution Place 2 drops in ear(s) daily. Reported on 11/12/2015 07/04/14  Yes Historical Provider, MD  pregabalin (LYRICA) 75 MG capsule Take 75 mg by mouth 2 (two) times daily.  05/07/13  Yes Historical Provider, MD  tamsulosin (FLOMAX) 0.4 MG CAPS capsule Take 0.4 mg by mouth.   Yes Historical Provider, MD    Scheduled Meds: . chlorhexidine gluconate (MEDLINE KIT)  15 mL Mouth Rinse BID  . Chlorhexidine Gluconate Cloth  6 each Topical Daily  . clindamycin (CLEOCIN) IV  600 mg Intravenous Q8H  . doxycycline (VIBRAMYCIN) IV  100 mg Intravenous Q12H  . enoxaparin (LOVENOX) injection  40 mg Subcutaneous Q24H   . famotidine  20 mg Oral Daily  . mouth rinse  15 mL Mouth Rinse BID  . pantoprazole (PROTONIX) IV  40 mg Intravenous Q24H  . polyvinyl alcohol  1 drop Both Eyes TID  . senna-docusate  1 tablet Oral QHS  . sodium chloride flush  10-40 mL Intracatheter Q12H  . tamsulosin  0.4 mg Oral QPC supper  . thiamine  100 mg Oral Daily   Continuous Infusions: . 0.9 % NaCl with KCl 40 mEq / L 125 mL/hr at 06/23/16 1600  . midazolam (VERSED) infusion 3 mg/hr (06/23/16 1600)   PRN Meds:.acetaminophen **OR** acetaminophen, bisacodyl, fentaNYL (SUBLIMAZE) injection, haloperidol lactate, LORazepam, ondansetron **OR** ondansetron (ZOFRAN) IV, sodium chloride flush     Results for orders placed or performed during the hospital encounter of 06/03/16 (from the past 48 hour(s))  Glucose, capillary     Status: Abnormal   Collection Time: 06/21/16  7:58 PM  Result Value Ref Range   Glucose-Capillary  100 (H) 65 - 99 mg/dL   Comment 1 Notify RN    Comment 2 Document in Chart   Glucose, capillary     Status: Abnormal   Collection Time: 06/21/16 11:54 PM  Result Value Ref Range   Glucose-Capillary 112 (H) 65 - 99 mg/dL   Comment 1 Notify RN    Comment 2 Document in Chart   Glucose, capillary     Status: Abnormal   Collection Time: 06/22/16  3:06 AM  Result Value Ref Range   Glucose-Capillary 103 (H) 65 - 99 mg/dL   Comment 1 Notify RN    Comment 2 Document in Chart   Glucose, capillary     Status: None   Collection Time: 06/22/16  7:23 AM  Result Value Ref Range   Glucose-Capillary 99 65 - 99 mg/dL  Glucose, capillary     Status: Abnormal   Collection Time: 06/22/16 11:24 AM  Result Value Ref Range   Glucose-Capillary 135 (H) 65 - 99 mg/dL  Basic metabolic panel     Status: Abnormal   Collection Time: 06/22/16 12:08 PM  Result Value Ref Range   Sodium 140 135 - 145 mmol/L   Potassium 3.3 (L) 3.5 - 5.1 mmol/L   Chloride 101 101 - 111 mmol/L   CO2 31 22 - 32 mmol/L   Glucose, Bld 141 (H) 65 -  99 mg/dL   BUN 16 6 - 20 mg/dL   Creatinine, Ser 0.65 0.61 - 1.24 mg/dL   Calcium 8.7 (L) 8.9 - 10.3 mg/dL   GFR calc non Af Amer >60 >60 mL/min   GFR calc Af Amer >60 >60 mL/min    Comment: (NOTE) The eGFR has been calculated using the CKD EPI equation. This calculation has not been validated in all clinical situations. eGFR's persistently <60 mL/min signify possible Chronic Kidney Disease.    Anion gap 8 5 - 15  Magnesium     Status: None   Collection Time: 06/22/16 12:08 PM  Result Value Ref Range   Magnesium 1.9 1.7 - 2.4 mg/dL  Phosphorus     Status: None   Collection Time: 06/22/16 12:08 PM  Result Value Ref Range   Phosphorus 3.1 2.5 - 4.6 mg/dL  Glucose, capillary     Status: Abnormal   Collection Time: 06/22/16  4:17 PM  Result Value Ref Range   Glucose-Capillary 103 (H) 65 - 99 mg/dL  Glucose, capillary     Status: None   Collection Time: 06/22/16  8:09 PM  Result Value Ref Range   Glucose-Capillary 95 65 - 99 mg/dL  Glucose, capillary     Status: Abnormal   Collection Time: 06/23/16 12:09 AM  Result Value Ref Range   Glucose-Capillary 110 (H) 65 - 99 mg/dL  Blood gas, arterial     Status: Abnormal   Collection Time: 06/23/16  4:15 AM  Result Value Ref Range   FIO2 100.00    Delivery systems VENTILATOR    Mode PRESSURE REGULATED VOLUME CONTROL    VT 550 mL   LHR 14.0 resp/min   Peep/cpap 5.0 cm H20   pH, Arterial 7.421 7.350 - 7.450   pCO2 arterial 42.4 32.0 - 48.0 mmHg   pO2, Arterial 70.4 (L) 83.0 - 108.0 mmHg   Bicarbonate 26.7 20.0 - 28.0 mmol/L   Acid-Base Excess 2.9 (H) 0.0 - 2.0 mmol/L   O2 Saturation 93.4 %   Patient temperature 37.0    Collection site LEFT RADIAL    Drawn by [id number]  Sample type ARTERIAL    Allens test (pass/fail) PASS PASS  Basic metabolic panel     Status: Abnormal   Collection Time: 06/23/16  4:36 AM  Result Value Ref Range   Sodium 143 135 - 145 mmol/L   Potassium 3.4 (L) 3.5 - 5.1 mmol/L   Chloride 108 101 - 111 mmol/L    CO2 28 22 - 32 mmol/L   Glucose, Bld 98 65 - 99 mg/dL   BUN 15 6 - 20 mg/dL   Creatinine, Ser 0.68 0.61 - 1.24 mg/dL   Calcium 8.1 (L) 8.9 - 10.3 mg/dL   GFR calc non Af Amer >60 >60 mL/min   GFR calc Af Amer >60 >60 mL/min    Comment: (NOTE) The eGFR has been calculated using the CKD EPI equation. This calculation has not been validated in all clinical situations. eGFR's persistently <60 mL/min signify possible Chronic Kidney Disease.    Anion gap 7 5 - 15  CBC     Status: Abnormal   Collection Time: 06/23/16  4:36 AM  Result Value Ref Range   WBC 7.2 4.0 - 10.5 K/uL   RBC 2.75 (L) 4.22 - 5.81 MIL/uL   Hemoglobin 8.8 (L) 13.0 - 17.0 g/dL   HCT 27.3 (L) 39.0 - 52.0 %   MCV 99.3 78.0 - 100.0 fL   MCH 32.0 26.0 - 34.0 pg   MCHC 32.2 30.0 - 36.0 g/dL   RDW 14.9 11.5 - 15.5 %   Platelets 420 (H) 150 - 400 K/uL  Magnesium     Status: None   Collection Time: 06/23/16  4:36 AM  Result Value Ref Range   Magnesium 1.7 1.7 - 2.4 mg/dL  Phosphorus     Status: Abnormal   Collection Time: 06/23/16  4:36 AM  Result Value Ref Range   Phosphorus 2.4 (L) 2.5 - 4.6 mg/dL  Glucose, capillary     Status: None   Collection Time: 06/23/16  5:28 AM  Result Value Ref Range   Glucose-Capillary 96 65 - 99 mg/dL  Glucose, capillary     Status: None   Collection Time: 06/23/16  7:29 AM  Result Value Ref Range   Glucose-Capillary 89 65 - 99 mg/dL  Glucose, capillary     Status: Abnormal   Collection Time: 06/23/16 11:15 AM  Result Value Ref Range   Glucose-Capillary 103 (H) 65 - 99 mg/dL  Glucose, capillary     Status: None   Collection Time: 06/23/16  4:07 PM  Result Value Ref Range   Glucose-Capillary 90 65 - 99 mg/dL    Studies/Results:  CTA LUNG IMPRESSION: Manubrial fracture  No evidence of pulmonary emboli.  Left lower lobe infiltrate and effusion.   REPEAT MRI/MRA 06-21-16 FINDINGS: MRI HEAD FINDINGS  Axial and coronal T2 sequences are severely motion degraded.  There is mild motion artifact on other sequences.  Brain: There is no evidence of acute infarct, intracranial hemorrhage, mass, midline shift, or extra-axial fluid collection. Mild generalized cerebral atrophy is within normal limits for age. No significant cerebral white matter disease is seen for age.  Vascular: Major intracranial vascular flow voids not well assessed due to motion.  Skull and upper cervical spine: No gross osseous lesion.  Sinuses/Orbits: No gross abnormality.  Other: None.  MRA HEAD FINDINGS  The visualized distal vertebral arteries are widely patent and codominant. The basilar artery is widely patent. Posterior communicating arteries are diminutive or absent. PCAs are patent without evidence of significant stenosis.  Internal carotid  arteries are patent from skullbase to carotid termini without evidence of significant stenosis. ACAs and MCAs are patent without evidence of major branch occlusion or significant proximal stenosis allowing for mild motion artifact. No intracranial aneurysm is identified.  IMPRESSION: 1. Motion degraded examination without evidence of acute intracranial abnormality. 2. Negative head MRA.            BRAIN MRI FINDINGS: Brain: Cerebral volume is within normal limits for age. No restricted diffusion to suggest acute infarction. No midline shift, mass effect, evidence of mass lesion, ventriculomegaly, extra-axial collection or acute intracranial hemorrhage. Cervicomedullary junction and pituitary are within normal limits.  Pearline Cables and white matter signal is within normal limits for age throughout the brain. No cortical encephalomalacia or chronic cerebral blood products.  Vascular: Major intracranial vascular flow voids are preserved.  Skull and upper cervical spine: Negative. Normal bone marrow signal.  Sinuses/Orbits: Normal orbits soft tissues. Trace paranasal sinus mucosal  thickening.  Other: Visible internal auditory structures appear normal. Mastoids are clear. Negative scalp soft tissues.  IMPRESSION: No acute intracranial abnormality. Normal for age noncontrast MRI appearance of the brain.            L SPINE MRI 2015 FINDINGS: Trace anterolisthesis of L3 on L4 and L4 on L5 is unchanged. There is grade 1 retrolisthesis of L1 on L2, stable to minimally increased from prior MRI. New from the prior MRI is a mild compression deformity involving the L2 superior endplate with approximately 10% vertebral body height loss. There is abnormal fluid signal within the L1-2 disc space, and there is also new edema throughout the L1 and L2 vertebral bodies. There is paraspinal inflammatory change/ phlegmon at this level, and there is a 1.9 x 1.3 cm fluid collection in the left psoas muscle at the L2 level (series 5, image 16).  Advanced disc space height loss at L5-S1 is unchanged, however there is increased fluid signal within the disc space compared to the prior MRI with mildly increased marrow edema within the adjacent L5 and S1 vertebral bodies. The conus medullaris is normal in signal and terminates at the superior aspect of L2.  T11-12: Only imaged sagittally. Facet hypertrophy results in mild bilateral neural foraminal narrowing, right greater than left and unchanged.  T12-L1:  Negative.  L1-2: Sequelae of prior laminectomy and posterior fusion are again identified. Posterior retropulsion of the superior L2 endplate, greater on the right, with possibly small adjacent epidural fluid collection, results in new narrowing of the right lateral recess and increased right neural foraminal narrowing. No spinal canal stenosis.  L2-3: Mild disc bulge and facet hypertrophy result in mild right lateral recess and neural foraminal narrowing, unchanged.  L3-4: Mild disc bulge and moderate facet hypertrophy result in mild right neural  foraminal narrowing, unchanged. No spinal canal stenosis.  L4-5: Left foraminal disc protrusion and mild-to-moderate facet hypertrophy result in mild to moderate left neural foraminal stenosis, unchanged. No spinal canal stenosis.  L5-S1: Disc bulge and mild facet hypertrophy result in mild to moderate bilateral neural foraminal stenosis, unchanged. No spinal canal stenosis.  IMPRESSION: 1. Changes at L1-2 as above concerning for discitis/osteomyelitis with small left psoas abscess. New, mild compression deformity of the L2 superior endplate and mild retropulsion results in new right lateral recess narrowing and increased right neural foraminal narrowing at L1-2. Small ventral epidural fluid collection is not excluded on the right. 2. Advanced degenerative disc disease at L5-S1 with a small amount of new fluid signal in the disc space  and mildly increased adjacent marrow changes. Early discitis/osteomyelitis is not excluded.       Rachana Malesky A. Merlene Rose, M.D.  Diplomate, Tax adviser of Psychiatry and Neurology ( Neurology). 06/23/2016, 5:22 PM

## 2016-06-23 NOTE — Progress Notes (Signed)
eLink Physician-Brief Progress Note Patient Name: Marc Rose DOB: March 06, 1951 MRN: [id number]   Date of Service  06/23/2016  HPI/Events of Note  ABG on 100%/PRVC 14/TV 550/P 5 = 7.42/42.4/70.4/26.7.  eICU Interventions  Will order: 1. Increase PEEP to 8.      Intervention Category Major Interventions: Respiratory failure - evaluation and management  Kirstie Larsen [place] 06/23/2016, 6:48 AM

## 2016-06-23 NOTE — Care Management (Signed)
LTAC referral sent again to [facility] per family choice. Patient intubated/ventilated again early this morning. Per attending and pulmonology patient will need a few more days before transfer to [facility]. Therefore, [facility] will proceed forward with insurance approval. Expected for this approval to take until Friday.

## 2016-06-23 NOTE — Care Management (Addendum)
Patient Information   Patient Name Marc Rose, Marc Rose ([id number]) Sex Male DOB 11/20/1950  Room Bed  IC07 IC07-01  Patient Demographics   Address [place] [address] Phone [phone] (Home)  Patient Ethnicity & Race   Ethnic Group Patient Race  Not Hispanic or Latino White or Caucasian  Emergency Contact(s)   Name Relation Home Work Mobile  [place] Sister [phone]  [phone]  Janett Labella [phone]    Jannifer Franklin [phone]    Documents on File    Status Date Received Description  Documents for the Patient  [place] Received 11/02/10   [place] E-Signature HIPAA Notice of Privacy Received 12/08/10   [place] E-Signature HIPAA Notice of Privacy [place] Not Received    Driver's License Received [id number]   Advance Directives/Living Will/HCPOA/POA Not Received    Insurance Card Received 05/17/16 [employer] [id number]  Financial Application Not Received    Insurance Card Received [id number]   Driver's License Received [id number]   Insurance Card Received 08/22/13   Insurance Card Not Received  NEW CARD  Driver's License Not Received    [employer] HIPAA NOTICE OF PRIVACY - Scanned Not Received    Insurance Card Not Received 05/15/15 [employer] INS CARD  Insurance Card Received 03/15/13 [facility]  [place] HIPAA NOTICE OF PRIVACY - Scanned Not Received    [place] HIPAA NOTICE OF PRIVACY - Scanned Not Received    Insurance Card Received 07/05/14 [email or web address]/[email or web address]  Insurance Card  07/25/14   AMB Correspondence  07/04/14 08/15-03/16 OFFICE NOTE [place]  Other Photo ID Not Received    Insurance Card Received 11/12/15 health team advantage, [facility]cd[facility] 2017  Insurance Card Received 05/28/16   AMB Correspondence (Deleted) 07/04/14 OFFICE NOTE [place]  Documents for the Encounter  AOB (Assignment of Insurance Benefits) Received 06/03/16 unable to sign due to  weakness  E-signature AOB     MEDICARE RIGHTS Received 06/03/16 unable to sign due to weakness  E-signature Medicare Rights     ED Patient Billing Extract   ED PB Summary  ED Patient Billing Extract   ED Encounter Summary  Cardiac Monitoring Strip Shift Summary  06/09/16   Cardiac Monitoring Strip  06/10/16   Ultrasound  06/13/16   EKG  06/04/16   Discharge Attachment (Deleted)  Acute Urinary Retention Male (English)  Discharge Attachment (Deleted)  Acute Urinary Retention Male (English)  Study Attachment for Report   External Report  Admission Information   Attending Provider Admitting Provider Admission Type Admission Date/Time  [name], MD Mauricio Gerome Apley, MD Emergency 06/03/16 [phone]  Discharge Date Hospital Service Auth/Cert Status [place]  Unit Room/Bed Admission Status   AP-ICCUP NURSING IC07/IC07-01 Admission (Confirmed)   Admission   Complaint  Weakness  Hospital Account   Name Acct ID Class Status Primary Coverage  [place], [place] [id number] Inpatient Open [place]      Guarantor Account (for [place] [id number])   Name Relation to Pt Service Area Active? Acct Type  Marikay Alar Self CHSA Yes Personal/Family  Address Phone    [address] [place], [place] [address] [phone])        Coverage Information (for Hospital Account [id number])   F/O Payor/Plan Precert #  [facility] ADVANTAGE/[employer] ADVANTAGE   Subscriber Subscriber #  [place], [place] [id number]  [place] [address] [phone]  SS# [phone]

## 2016-06-24 ENCOUNTER — Inpatient Hospital Stay (HOSPITAL_COMMUNITY): Payer: PPO

## 2016-06-24 DIAGNOSIS — Z7189 Other specified counseling: Secondary | ICD-10-CM

## 2016-06-24 DIAGNOSIS — R4182 Altered mental status, unspecified: Secondary | ICD-10-CM | POA: Diagnosis not present

## 2016-06-24 DIAGNOSIS — R2689 Other abnormalities of gait and mobility: Secondary | ICD-10-CM | POA: Diagnosis not present

## 2016-06-24 DIAGNOSIS — G629 Polyneuropathy, unspecified: Secondary | ICD-10-CM | POA: Diagnosis not present

## 2016-06-24 LAB — BLOOD GAS, ARTERIAL
Acid-base deficit: 0.2 mmol/L (ref 0.0–2.0)
Bicarbonate: 24.2 mmol/L (ref 20.0–28.0)
Drawn by: 23534
FIO2: 0.4
O2 Content: 40 L/min
O2 SAT: 95.3 %
PCO2 ART: 39.6 mmHg (ref 32.0–48.0)
PEEP: 5 cmH2O
PH ART: 7.399 (ref 7.350–7.450)
RATE: 14 resp/min
VT: 550 mL
pO2, Arterial: 81.9 mmHg — ABNORMAL LOW (ref 83.0–108.0)

## 2016-06-24 LAB — CBC
HCT: 26.9 % — ABNORMAL LOW (ref 39.0–52.0)
Hemoglobin: 9.1 g/dL — ABNORMAL LOW (ref 13.0–17.0)
MCH: 32.9 pg (ref 26.0–34.0)
MCHC: 33.8 g/dL (ref 30.0–36.0)
MCV: 97.1 fL (ref 78.0–100.0)
PLATELETS: 428 10*3/uL — AB (ref 150–400)
RBC: 2.77 MIL/uL — ABNORMAL LOW (ref 4.22–5.81)
RDW: 15.7 % — AB (ref 11.5–15.5)
WBC: 7.1 10*3/uL (ref 4.0–10.5)

## 2016-06-24 LAB — GLUCOSE, CAPILLARY
GLUCOSE-CAPILLARY: 86 mg/dL (ref 65–99)
GLUCOSE-CAPILLARY: 90 mg/dL (ref 65–99)
GLUCOSE-CAPILLARY: 99 mg/dL (ref 65–99)
Glucose-Capillary: 91 mg/dL (ref 65–99)
Glucose-Capillary: 117 mg/dL — ABNORMAL HIGH (ref 65–99)

## 2016-06-24 LAB — BASIC METABOLIC PANEL
ANION GAP: 5 (ref 5–15)
BUN: 14 mg/dL (ref 6–20)
CALCIUM: 8.3 mg/dL — AB (ref 8.9–10.3)
CO2: 27 mmol/L (ref 22–32)
CREATININE: 0.71 mg/dL (ref 0.61–1.24)
Chloride: 110 mmol/L (ref 101–111)
Glucose, Bld: 92 mg/dL (ref 65–99)
Potassium: 3.8 mmol/L (ref 3.5–5.1)
SODIUM: 142 mmol/L (ref 135–145)

## 2016-06-24 MED ORDER — IMMUNE GLOBULIN (HUMAN) 10 GM/100ML IV SOLN
400.0000 mg/kg | INTRAVENOUS | Status: DC
  Administered 2016-06-24 – 2016-06-25 (×2): 30 g via INTRAVENOUS
  Filled 2016-06-24 (×5): qty 300

## 2016-06-24 MED ORDER — MIDAZOLAM HCL 50 MG/10ML IJ SOLN
INTRAMUSCULAR | Status: AC
  Filled 2016-06-24: qty 1

## 2016-06-24 MED FILL — Medication: Qty: 1 | Status: AC

## 2016-06-24 NOTE — Progress Notes (Signed)
PT Cancellation Note  Patient Details Name: Marc Rose MRN: [id number] DOB: 1950-09-22   Cancelled Treatment:    Reason Eval/Treat Not Completed: Patient not medically ready (Pt is currently intubated and sedated.  Will check back. )   Beth Demarious Kapur, PT, DPT X: [phone]

## 2016-06-24 NOTE — Progress Notes (Signed)
[place] A. [name], MD     [email or web address]          Marc Rose is an 66 y.o. male.   ASSESSMENT/PLAN:      Resolving Severe delirium/encephalopathy of unclear etiology at this time. We are suspecting that this may be multifactorial including UTI and medication effect from Levaquin. Levaquin has been discontinued. The picture seems most consistent with toxic metabolic encephalopathy/delirium as opposed to primary and CNS problem. The patient's spinal fluid analysis shows the typical formula with elevated protein and essentially unremarkable white cell count (cytoalbuminologic dissociation) seen in demyelinating conditions such as Guillain-Barr syndrome. The CSF does not show evidence of central nervous system infection.  I recommend that agitation be treated with when necessary doses of Haldol. I will increase his maintenance dose of Haldol. Will continue with the Seroquel. Around the clock haldol restarted.    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 Completed 5 day course of IV IG. PT and OT. Unfortunately seems to still getting worse neurologically and respirationally. 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 HX of chronic discitis            The patient continues to be intubated. It appears that his sedation has been reduced. More use of Haldol is being done.      GENERAL: He is intubated.   HEENT: Supple. Atraumatic normocephalic. Dense cataracts both sides.  ABDOMEN: soft  EXTREMITIES: No edema. Multiple abrasions and lacerations involving the knees and legs.   BACK: Normal.  SKIN: Normal by inspection.    MENTAL STATUS: The patient opens his eyes to verbal commands. He is more restless and  agitated today.   CRANIAL NERVES:  Coronary reflexes are absent. oculocephalic reflexes are present. Pupils are reactive.    MOTOR: He has more vigorous movements of his extremities especially upper extremities which have to be restrained. Strength is at least 4/5.  COORDINATION: Very ataxic - trunk and appendiular  REFLEXES: The patient is areflexic in the legs and upper extremities.    EEG normal  Blood pressure (!) 152/96, pulse 97, temperature 97.8 F (36.6 C), temperature source Axillary, resp. rate 14, height 6' 2" (1.88 m), weight 149 lb 0.5 oz (67.6 kg), SpO2 99 %.  Past Medical History:  Diagnosis Date  . Anxiety state, unspecified   . Arthritis   . 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 Benign neoplasm of colon   . Causalgia of upper limb    Left limb  . Chronic back pain   . Chronic leg pain   . Depression   . Diabetes mellitus   . Diskitis 02/05/2015  . Drug rash 11/12/2015  . Elevated blood pressure reading without diagnosis of hypertension   . Essential hypertension, benign   . Hardware complicating wound infection ([place]) 02/05/2015  . Hyperpigmentation 05/15/2015  . Hypertension   . Impaired fasting glucose   . Peripheral arterial disease ([place])   . Pure hypercholesterolemia   . Tobacco use disorder   . Unspecified retinal detachment     Past Surgical History:  Procedure Laterality Date  . ANKLE SURGERY     left  . APPENDECTOMY    . BACK SURGERY    . COLONOSCOPY    . KNEE SURGERY  left    Family History  Problem Relation Age of Onset  . COPD Mother   . Hyperthyroidism Mother   . Hyperthyroidism Sister     Social History:  reports that he has been smoking Cigarettes.  He has a 19.50 pack-year smoking history. He has never used smokeless tobacco. He reports that he uses drugs, including Marijuana. He reports that he does not drink alcohol.  Allergies:  Allergies  Allergen Reactions  . Codeine Anaphylaxis, Hives and Swelling  . Penicillins Anaphylaxis, Hives  and Swelling  . Latex Itching  . Strawberry Extract Hives    Medications: Prior to Admission medications   Medication Sig Start Date End Date Taking? Authorizing Provider  aspirin EC 81 MG tablet Take 81 mg by mouth daily.   Yes Historical Provider, MD  benazepril (LOTENSIN) 40 MG tablet Take 40 mg by mouth daily.   Yes Historical Provider, MD  cyclobenzaprine (FLEXERIL) 10 MG tablet Take 1 tablet (10 mg total) by mouth 3 (three) times daily as needed for muscle spasms. 09/19/13  Yes Adeline Saralyn Pilar, MD  diazepam (VALIUM) 10 MG tablet Take 0.5 tablets (5 mg total) by mouth every 6 (six) hours as needed for anxiety (back spasms). Patient taking differently: Take 10 mg by mouth 4 (four) times daily as needed for anxiety (back spasms).  09/19/13  Yes Adeline Saralyn Pilar, MD  doxycycline (VIBRA-TABS) 100 MG tablet Take 1 tablet (100 mg total) by mouth 2 (two) times daily. 11/27/15  Yes Truman Hayward, MD  fluticasone [facility]) 50 MCG/ACT nasal spray Place 1 spray into the nose daily as needed for allergies.  07/04/14  Yes Historical Provider, MD  NEOMYCIN-POLYMYXIN-HYDROCORTISONE (CORTISPORIN) 1 % SOLN otic solution Place 2 drops in ear(s) daily. Reported on 11/12/2015 07/04/14  Yes Historical Provider, MD  pregabalin (LYRICA) 75 MG capsule Take 75 mg by mouth 2 (two) times daily.  05/07/13  Yes Historical Provider, MD  tamsulosin (FLOMAX) 0.4 MG CAPS capsule Take 0.4 mg by mouth.   Yes Historical Provider, MD    Scheduled Meds: . chlorhexidine gluconate (MEDLINE KIT)  15 mL Mouth Rinse BID  . Chlorhexidine Gluconate Cloth  6 each Topical Daily  . clindamycin (CLEOCIN) IV  600 mg Intravenous Q8H  . doxycycline (VIBRAMYCIN) IV  100 mg Intravenous Q12H  . enoxaparin (LOVENOX) injection  40 mg Subcutaneous Q24H  . IMMUNE GLOBULIN 10% (HUMAN) IV - For Fluid Restriction Only  400 mg/kg Intravenous Q24H  . mouth rinse  15 mL Mouth Rinse BID  . pantoprazole (PROTONIX) IV  40 mg Intravenous Q24H  .  polyvinyl alcohol  1 drop Both Eyes TID  . senna-docusate  1 tablet Oral QHS  . sodium chloride flush  10-40 mL Intracatheter Q12H  . tamsulosin  0.4 mg Oral QPC supper  . thiamine  100 mg Oral Daily   Continuous Infusions: . 0.9 % NaCl with KCl 40 mEq / L 125 mL/hr (06/24/16 0851)  . fentaNYL infusion INTRAVENOUS 250 mcg/hr (06/24/16 1559)  . midazolam (VERSED) infusion 3 mg/hr (06/24/16 1538)   PRN Meds:.acetaminophen **OR** acetaminophen, bisacodyl, fentaNYL, fentaNYL (SUBLIMAZE) injection, haloperidol lactate, ondansetron **OR** ondansetron (ZOFRAN) IV, sodium chloride flush     Results for orders placed or performed during the hospital encounter of 06/03/16 (from the past 48 hour(s))  Glucose, capillary     Status: None   Collection Time: 06/22/16  8:09 PM  Result Value Ref Range   Glucose-Capillary 95 65 - 99 mg/dL  Glucose, capillary  Status: Abnormal   Collection Time: 06/23/16 12:09 AM  Result Value Ref Range   Glucose-Capillary 110 (H) 65 - 99 mg/dL  Blood gas, arterial     Status: Abnormal   Collection Time: 06/23/16  4:15 AM  Result Value Ref Range   FIO2 100.00    Delivery systems VENTILATOR    Mode PRESSURE REGULATED VOLUME CONTROL    VT 550 mL   LHR 14.0 resp/min   Peep/cpap 5.0 cm H20   pH, Arterial 7.421 7.350 - 7.450   pCO2 arterial 42.4 32.0 - 48.0 mmHg   pO2, Arterial 70.4 (L) 83.0 - 108.0 mmHg   Bicarbonate 26.7 20.0 - 28.0 mmol/L   Acid-Base Excess 2.9 (H) 0.0 - 2.0 mmol/L   O2 Saturation 93.4 %   Patient temperature 37.0    Collection site LEFT RADIAL    Drawn by [id number]    Sample type ARTERIAL    Allens test (pass/fail) PASS PASS  Basic metabolic panel     Status: Abnormal   Collection Time: 06/23/16  4:36 AM  Result Value Ref Range   Sodium 143 135 - 145 mmol/L   Potassium 3.4 (L) 3.5 - 5.1 mmol/L   Chloride 108 101 - 111 mmol/L   CO2 28 22 - 32 mmol/L   Glucose, Bld 98 65 - 99 mg/dL   BUN 15 6 - 20 mg/dL   Creatinine, Ser 0.68 0.61 -  1.24 mg/dL   Calcium 8.1 (L) 8.9 - 10.3 mg/dL   GFR calc non Af Amer >60 >60 mL/min   GFR calc Af Amer >60 >60 mL/min    Comment: (NOTE) The eGFR has been calculated using the CKD EPI equation. This calculation has not been validated in all clinical situations. eGFR's persistently <60 mL/min signify possible Chronic Kidney Disease.    Anion gap 7 5 - 15  CBC     Status: Abnormal   Collection Time: 06/23/16  4:36 AM  Result Value Ref Range   WBC 7.2 4.0 - 10.5 K/uL   RBC 2.75 (L) 4.22 - 5.81 MIL/uL   Hemoglobin 8.8 (L) 13.0 - 17.0 g/dL   HCT 27.3 (L) 39.0 - 52.0 %   MCV 99.3 78.0 - 100.0 fL   MCH 32.0 26.0 - 34.0 pg   MCHC 32.2 30.0 - 36.0 g/dL   RDW 14.9 11.5 - 15.5 %   Platelets 420 (H) 150 - 400 K/uL  Magnesium     Status: None   Collection Time: 06/23/16  4:36 AM  Result Value Ref Range   Magnesium 1.7 1.7 - 2.4 mg/dL  Phosphorus     Status: Abnormal   Collection Time: 06/23/16  4:36 AM  Result Value Ref Range   Phosphorus 2.4 (L) 2.5 - 4.6 mg/dL  Glucose, capillary     Status: None   Collection Time: 06/23/16  5:28 AM  Result Value Ref Range   Glucose-Capillary 96 65 - 99 mg/dL  Glucose, capillary     Status: None   Collection Time: 06/23/16  7:29 AM  Result Value Ref Range   Glucose-Capillary 89 65 - 99 mg/dL  Glucose, capillary     Status: Abnormal   Collection Time: 06/23/16 11:15 AM  Result Value Ref Range   Glucose-Capillary 103 (H) 65 - 99 mg/dL  Glucose, capillary     Status: None   Collection Time: 06/23/16  4:07 PM  Result Value Ref Range   Glucose-Capillary 90 65 - 99 mg/dL  Glucose, capillary  Status: None   Collection Time: 06/23/16  7:50 PM  Result Value Ref Range   Glucose-Capillary 85 65 - 99 mg/dL   Comment 1 Notify RN   Glucose, capillary     Status: Abnormal   Collection Time: 06/24/16 12:00 AM  Result Value Ref Range   Glucose-Capillary 115 (H) 65 - 99 mg/dL   Comment 1 Notify RN   Glucose, capillary     Status: None   Collection  Time: 06/24/16  4:00 AM  Result Value Ref Range   Glucose-Capillary 86 65 - 99 mg/dL   Comment 1 Notify RN   CBC     Status: Abnormal   Collection Time: 06/24/16  4:07 AM  Result Value Ref Range   WBC 7.1 4.0 - 10.5 K/uL   RBC 2.77 (L) 4.22 - 5.81 MIL/uL   Hemoglobin 9.1 (L) 13.0 - 17.0 g/dL   HCT 26.9 (L) 39.0 - 52.0 %   MCV 97.1 78.0 - 100.0 fL   MCH 32.9 26.0 - 34.0 pg   MCHC 33.8 30.0 - 36.0 g/dL   RDW 15.7 (H) 11.5 - 15.5 %   Platelets 428 (H) 150 - 400 K/uL  Basic metabolic panel     Status: Abnormal   Collection Time: 06/24/16  4:07 AM  Result Value Ref Range   Sodium 142 135 - 145 mmol/L   Potassium 3.8 3.5 - 5.1 mmol/L   Chloride 110 101 - 111 mmol/L   CO2 27 22 - 32 mmol/L   Glucose, Bld 92 65 - 99 mg/dL   BUN 14 6 - 20 mg/dL   Creatinine, Ser 0.71 0.61 - 1.24 mg/dL   Calcium 8.3 (L) 8.9 - 10.3 mg/dL   GFR calc non Af Amer >60 >60 mL/min   GFR calc Af Amer >60 >60 mL/min    Comment: (NOTE) The eGFR has been calculated using the CKD EPI equation. This calculation has not been validated in all clinical situations. eGFR's persistently <60 mL/min signify possible Chronic Kidney Disease.    Anion gap 5 5 - 15  Glucose, capillary     Status: None   Collection Time: 06/24/16  7:33 AM  Result Value Ref Range   Glucose-Capillary 91 65 - 99 mg/dL  Blood gas, arterial     Status: Abnormal   Collection Time: 06/24/16  8:55 AM  Result Value Ref Range   FIO2 0.40    O2 Content 40.0 L/min   Delivery systems VENTILATOR    Mode PRESSURE REGULATED VOLUME CONTROL    VT 550 mL   LHR 14 resp/min   Peep/cpap 5.0 cm H20   pH, Arterial 7.399 7.350 - 7.450   pCO2 arterial 39.6 32.0 - 48.0 mmHg   pO2, Arterial 81.9 (L) 83.0 - 108.0 mmHg   Bicarbonate 24.2 20.0 - 28.0 mmol/L   Acid-base deficit 0.2 0.0 - 2.0 mmol/L   O2 Saturation 95.3 %   Collection site LEFT RADIAL    Drawn by [phone]    Sample type ARTERIAL    Allens test (pass/fail) PASS PASS  Glucose, capillary      Status: Abnormal   Collection Time: 06/24/16 11:34 AM  Result Value Ref Range   Glucose-Capillary 117 (H) 65 - 99 mg/dL  Glucose, capillary     Status: None   Collection Time: 06/24/16  4:09 PM  Result Value Ref Range   Glucose-Capillary 90 65 - 99 mg/dL    Studies/Results:  CTA LUNG IMPRESSION: Manubrial fracture  No evidence of  pulmonary emboli.  Left lower lobe infiltrate and effusion.   REPEAT MRI/MRA 06-21-16 FINDINGS: MRI HEAD FINDINGS  Axial and coronal T2 sequences are severely motion degraded. There is mild motion artifact on other sequences.  Brain: There is no evidence of acute infarct, intracranial hemorrhage, mass, midline shift, or extra-axial fluid collection. Mild generalized cerebral atrophy is within normal limits for age. No significant cerebral white matter disease is seen for age.  Vascular: Major intracranial vascular flow voids not well assessed due to motion.  Skull and upper cervical spine: No gross osseous lesion.  Sinuses/Orbits: No gross abnormality.  Other: None.  MRA HEAD FINDINGS  The visualized distal vertebral arteries are widely patent and codominant. The basilar artery is widely patent. Posterior communicating arteries are diminutive or absent. PCAs are patent without evidence of significant stenosis.  Internal carotid arteries are patent from skullbase to carotid termini without evidence of significant stenosis. ACAs and MCAs are patent without evidence of major branch occlusion or significant proximal stenosis allowing for mild motion artifact. No intracranial aneurysm is identified.  IMPRESSION: 1. Motion degraded examination without evidence of acute intracranial abnormality. 2. Negative head MRA.            BRAIN MRI FINDINGS: Brain: Cerebral volume is within normal limits for age. No restricted diffusion to suggest acute infarction. No midline shift, mass effect, evidence of mass lesion,  ventriculomegaly, extra-axial collection or acute intracranial hemorrhage. Cervicomedullary junction and pituitary are within normal limits.  Pearline Cables and white matter signal is within normal limits for age throughout the brain. No cortical encephalomalacia or chronic cerebral blood products.  Vascular: Major intracranial vascular flow voids are preserved.  Skull and upper cervical spine: Negative. Normal bone marrow signal.  Sinuses/Orbits: Normal orbits soft tissues. Trace paranasal sinus mucosal thickening.  Other: Visible internal auditory structures appear normal. Mastoids are clear. Negative scalp soft tissues.  IMPRESSION: No acute intracranial abnormality. Normal for age noncontrast MRI appearance of the brain.            L SPINE MRI 2015 FINDINGS: Trace anterolisthesis of L3 on L4 and L4 on L5 is unchanged. There is grade 1 retrolisthesis of L1 on L2, stable to minimally increased from prior MRI. New from the prior MRI is a mild compression deformity involving the L2 superior endplate with approximately 10% vertebral body height loss. There is abnormal fluid signal within the L1-2 disc space, and there is also new edema throughout the L1 and L2 vertebral bodies. There is paraspinal inflammatory change/ phlegmon at this level, and there is a 1.9 x 1.3 cm fluid collection in the left psoas muscle at the L2 level (series 5, image 16).  Advanced disc space height loss at L5-S1 is unchanged, however there is increased fluid signal within the disc space compared to the prior MRI with mildly increased marrow edema within the adjacent L5 and S1 vertebral bodies. The conus medullaris is normal in signal and terminates at the superior aspect of L2.  T11-12: Only imaged sagittally. Facet hypertrophy results in mild bilateral neural foraminal narrowing, right greater than left and unchanged.  T12-L1:  Negative.  L1-2: Sequelae of prior laminectomy and  posterior fusion are again identified. Posterior retropulsion of the superior L2 endplate, greater on the right, with possibly small adjacent epidural fluid collection, results in new narrowing of the right lateral recess and increased right neural foraminal narrowing. No spinal canal stenosis.  L2-3: Mild disc bulge and facet hypertrophy result in mild right lateral recess and  neural foraminal narrowing, unchanged.  L3-4: Mild disc bulge and moderate facet hypertrophy result in mild right neural foraminal narrowing, unchanged. No spinal canal stenosis.  L4-5: Left foraminal disc protrusion and mild-to-moderate facet hypertrophy result in mild to moderate left neural foraminal stenosis, unchanged. No spinal canal stenosis.  L5-S1: Disc bulge and mild facet hypertrophy result in mild to moderate bilateral neural foraminal stenosis, unchanged. No spinal canal stenosis.  IMPRESSION: 1. Changes at L1-2 as above concerning for discitis/osteomyelitis with small left psoas abscess. New, mild compression deformity of the L2 superior endplate and mild retropulsion results in new right lateral recess narrowing and increased right neural foraminal narrowing at L1-2. Small ventral epidural fluid collection is not excluded on the right. 2. Advanced degenerative disc disease at L5-S1 with a small amount of new fluid signal in the disc space and mildly increased adjacent marrow changes. Early discitis/osteomyelitis is not excluded.       Sai Zinn A. [name], M.D.  Diplomate, [employer] adviser of Psychiatry and Neurology ( Neurology). 06/24/2016, 6:13 PM

## 2016-06-24 NOTE — Progress Notes (Signed)
OT Cancellation Note  Patient Details Name: Marc Rose MRN: [id number] DOB: 1951-01-06   Cancelled Treatment:    Reason Eval/Treat Not Completed: Patient's level of consciousness. Pt recently sedated by Nursing and is unable to participate in therapy session at this time.  Ailene Ravel, OTR/L,CBIS  [phone]  06/24/2016, 9:56 AM

## 2016-06-24 NOTE — Progress Notes (Signed)
Subjective: He remains intubated and on the ventilator. His oxygenation has been able to wean down from 100% to 40%. He has been agitated. No other new problems noted  Objective: Vital signs in last 24 hours: Temp:  [98.1 F (36.7 C)-99.2 F (37.3 C)] 99.2 F (37.3 C) (02/22 0400) Pulse Rate:  [86-119] 119 (02/22 0821) Resp:  [10-18] 14 (02/22 0821) BP: (68-156)/(46-107) 125/82 (02/22 0600) SpO2:  [93 %-100 %] 93 % (02/22 0821) FiO2 (%):  [40 %-80 %] 40 % (02/22 0821) Weight:  [67.6 kg (149 lb 0.5 oz)] 67.6 kg (149 lb 0.5 oz) (02/22 0500) Weight change:  Last BM Date: 06/20/16  Intake/Output from previous day: 02/21 0701 - 02/22 0700 In: 3847.8 [I.V.:3347.8; IV Piggyback:500] Out: 2680 [Urine:2500; Emesis/NG output:180]  PHYSICAL EXAM General appearance: Intubated on the ventilator now calm but he has been agitated Resp: rhonchi bilaterally Cardio: regular rate and rhythm, S1, S2 normal, no murmur, click, rub or gallop GI: soft, non-tender; bowel sounds normal; no masses,  no organomegaly Extremities: extremities normal, atraumatic, no cyanosis or edema Skin warm and dry. Mucous membranes are moist  Lab Results:  Results for orders placed or performed during the hospital encounter of 06/03/16 (from the past 48 hour(s))  Glucose, capillary     Status: Abnormal   Collection Time: 06/22/16 11:24 AM  Result Value Ref Range   Glucose-Capillary 135 (H) 65 - 99 mg/dL  Basic metabolic panel     Status: Abnormal   Collection Time: 06/22/16 12:08 PM  Result Value Ref Range   Sodium 140 135 - 145 mmol/L   Potassium 3.3 (L) 3.5 - 5.1 mmol/L   Chloride 101 101 - 111 mmol/L   CO2 31 22 - 32 mmol/L   Glucose, Bld 141 (H) 65 - 99 mg/dL   BUN 16 6 - 20 mg/dL   Creatinine, Ser 0.65 0.61 - 1.24 mg/dL   Calcium 8.7 (L) 8.9 - 10.3 mg/dL   GFR calc non Af Amer >60 >60 mL/min   GFR calc Af Amer >60 >60 mL/min    Comment: (NOTE) The eGFR has been calculated using the CKD EPI  equation. This calculation has not been validated in all clinical situations. eGFR's persistently <60 mL/min signify possible Chronic Kidney Disease.    Anion gap 8 5 - 15  Magnesium     Status: None   Collection Time: 06/22/16 12:08 PM  Result Value Ref Range   Magnesium 1.9 1.7 - 2.4 mg/dL  Phosphorus     Status: None   Collection Time: 06/22/16 12:08 PM  Result Value Ref Range   Phosphorus 3.1 2.5 - 4.6 mg/dL  Glucose, capillary     Status: Abnormal   Collection Time: 06/22/16  4:17 PM  Result Value Ref Range   Glucose-Capillary 103 (H) 65 - 99 mg/dL  Glucose, capillary     Status: None   Collection Time: 06/22/16  8:09 PM  Result Value Ref Range   Glucose-Capillary 95 65 - 99 mg/dL  Glucose, capillary     Status: Abnormal   Collection Time: 06/23/16 12:09 AM  Result Value Ref Range   Glucose-Capillary 110 (H) 65 - 99 mg/dL  Blood gas, arterial     Status: Abnormal   Collection Time: 06/23/16  4:15 AM  Result Value Ref Range   FIO2 100.00    Delivery systems VENTILATOR    Mode PRESSURE REGULATED VOLUME CONTROL    VT 550 mL   LHR 14.0 resp/min   Peep/cpap 5.0  cm H20   pH, Arterial 7.421 7.350 - 7.450   pCO2 arterial 42.4 32.0 - 48.0 mmHg   pO2, Arterial 70.4 (L) 83.0 - 108.0 mmHg   Bicarbonate 26.7 20.0 - 28.0 mmol/L   Acid-Base Excess 2.9 (H) 0.0 - 2.0 mmol/L   O2 Saturation 93.4 %   Patient temperature 37.0    Collection site LEFT RADIAL    Drawn by [id number]    Sample type ARTERIAL    Allens test (pass/fail) PASS PASS  Basic metabolic panel     Status: Abnormal   Collection Time: 06/23/16  4:36 AM  Result Value Ref Range   Sodium 143 135 - 145 mmol/L   Potassium 3.4 (L) 3.5 - 5.1 mmol/L   Chloride 108 101 - 111 mmol/L   CO2 28 22 - 32 mmol/L   Glucose, Bld 98 65 - 99 mg/dL   BUN 15 6 - 20 mg/dL   Creatinine, Ser 0.68 0.61 - 1.24 mg/dL   Calcium 8.1 (L) 8.9 - 10.3 mg/dL   GFR calc non Af Amer >60 >60 mL/min   GFR calc Af Amer >60 >60 mL/min    Comment:  (NOTE) The eGFR has been calculated using the CKD EPI equation. This calculation has not been validated in all clinical situations. eGFR's persistently <60 mL/min signify possible Chronic Kidney Disease.    Anion gap 7 5 - 15  CBC     Status: Abnormal   Collection Time: 06/23/16  4:36 AM  Result Value Ref Range   WBC 7.2 4.0 - 10.5 K/uL   RBC 2.75 (L) 4.22 - 5.81 MIL/uL   Hemoglobin 8.8 (L) 13.0 - 17.0 g/dL   HCT 27.3 (L) 39.0 - 52.0 %   MCV 99.3 78.0 - 100.0 fL   MCH 32.0 26.0 - 34.0 pg   MCHC 32.2 30.0 - 36.0 g/dL   RDW 14.9 11.5 - 15.5 %   Platelets 420 (H) 150 - 400 K/uL  Magnesium     Status: None   Collection Time: 06/23/16  4:36 AM  Result Value Ref Range   Magnesium 1.7 1.7 - 2.4 mg/dL  Phosphorus     Status: Abnormal   Collection Time: 06/23/16  4:36 AM  Result Value Ref Range   Phosphorus 2.4 (L) 2.5 - 4.6 mg/dL  Glucose, capillary     Status: None   Collection Time: 06/23/16  5:28 AM  Result Value Ref Range   Glucose-Capillary 96 65 - 99 mg/dL  Glucose, capillary     Status: None   Collection Time: 06/23/16  7:29 AM  Result Value Ref Range   Glucose-Capillary 89 65 - 99 mg/dL  Glucose, capillary     Status: Abnormal   Collection Time: 06/23/16 11:15 AM  Result Value Ref Range   Glucose-Capillary 103 (H) 65 - 99 mg/dL  Glucose, capillary     Status: None   Collection Time: 06/23/16  4:07 PM  Result Value Ref Range   Glucose-Capillary 90 65 - 99 mg/dL  Glucose, capillary     Status: None   Collection Time: 06/23/16  7:50 PM  Result Value Ref Range   Glucose-Capillary 85 65 - 99 mg/dL   Comment 1 Notify RN   Glucose, capillary     Status: Abnormal   Collection Time: 06/24/16 12:00 AM  Result Value Ref Range   Glucose-Capillary 115 (H) 65 - 99 mg/dL   Comment 1 Notify RN   Glucose, capillary     Status: None  Collection Time: 06/24/16  4:00 AM  Result Value Ref Range   Glucose-Capillary 86 65 - 99 mg/dL   Comment 1 Notify RN   CBC     Status: Abnormal    Collection Time: 06/24/16  4:07 AM  Result Value Ref Range   WBC 7.1 4.0 - 10.5 K/uL   RBC 2.77 (L) 4.22 - 5.81 MIL/uL   Hemoglobin 9.1 (L) 13.0 - 17.0 g/dL   HCT 26.9 (L) 39.0 - 52.0 %   MCV 97.1 78.0 - 100.0 fL   MCH 32.9 26.0 - 34.0 pg   MCHC 33.8 30.0 - 36.0 g/dL   RDW 15.7 (H) 11.5 - 15.5 %   Platelets 428 (H) 150 - 400 K/uL  Basic metabolic panel     Status: Abnormal   Collection Time: 06/24/16  4:07 AM  Result Value Ref Range   Sodium 142 135 - 145 mmol/L   Potassium 3.8 3.5 - 5.1 mmol/L   Chloride 110 101 - 111 mmol/L   CO2 27 22 - 32 mmol/L   Glucose, Bld 92 65 - 99 mg/dL   BUN 14 6 - 20 mg/dL   Creatinine, Ser 0.71 0.61 - 1.24 mg/dL   Calcium 8.3 (L) 8.9 - 10.3 mg/dL   GFR calc non Af Amer >60 >60 mL/min   GFR calc Af Amer >60 >60 mL/min    Comment: (NOTE) The eGFR has been calculated using the CKD EPI equation. This calculation has not been validated in all clinical situations. eGFR's persistently <60 mL/min signify possible Chronic Kidney Disease.    Anion gap 5 5 - 15  Glucose, capillary     Status: None   Collection Time: 06/24/16  7:33 AM  Result Value Ref Range   Glucose-Capillary 91 65 - 99 mg/dL    ABGS  Recent Labs  06/23/16 0415  PHART 7.421  PO2ART 70.4*  HCO3 26.7   CULTURES No results found for this or any previous visit (from the past 240 hour(s)). Studies/Results: Dg Chest 1 View  Result Date: 06/23/2016 CLINICAL DATA:  ET tube placement EXAM: CHEST 1 VIEW COMPARISON:  06/21/2016 FINDINGS: Endotracheal tube tip is approximately 5.6 cm superior to the carina. Esophageal tube tip appears to terminate at the distal esophagus. Right upper extremity catheter tip overlies the SVC. A right lung is grossly clear. Interval consolidation of left lung base with tiny left effusion. Stable heart size. IMPRESSION: 1. Endotracheal tube tip 5.6 cm superior to carina 2. Esophageal tube tip appears to terminate at the level of distal esophagus, recommend  further advancement for more optimal positioning 3. Interval consolidation of the left lung base with presence of tiny left effusion Electronically Signed   By: Donavan Foil M.D.   On: 06/23/2016 03:22   Dg Chest Port 1 View  Result Date: 06/24/2016 CLINICAL DATA:  Respiratory failure, ventilator patient, variant of Guillain-Barre, former smoker. EXAM: PORTABLE CHEST 1 VIEW COMPARISON:  Portable chest x-ray of June 23, 2016 FINDINGS: The right lung is well-expanded. On the left there is increased retrocardiac density with small left pleural effusion. The heart is normal in size. The pulmonary vascularity is not engorged. The endotracheal tube tip measures 4.7 cm above the carina. The esophagogastric tubes proximal port is at the level of the GE junction. The left PICC line tip projects over the distal SVC. IMPRESSION: Left lower lobe atelectasis and small left pleural effusion not greatly changed from yesterday's study. No CHF. Underlying COPD. The endotracheal tube and PICC line  are in reasonable position. Advancement of the esophagogastric tube by 5-10 cm would assure that the proximal port remains below the GE junction. Electronically Signed   By: David  Martinique M.D.   On: 06/24/2016 07:36    Medications:  Prior to Admission:  Prescriptions Prior to Admission  Medication Sig Dispense Refill Last Dose  . aspirin EC 81 MG tablet Take 81 mg by mouth daily.   Past Week at Unknown time  . benazepril (LOTENSIN) 40 MG tablet Take 40 mg by mouth daily.   Past Week at Unknown time  . cyclobenzaprine (FLEXERIL) 10 MG tablet Take 1 tablet (10 mg total) by mouth 3 (three) times daily as needed for muscle spasms. 30 tablet 0 Past Week at Unknown time  . diazepam (VALIUM) 10 MG tablet Take 0.5 tablets (5 mg total) by mouth every 6 (six) hours as needed for anxiety (back spasms). (Patient taking differently: Take 10 mg by mouth 4 (four) times daily as needed for anxiety (back spasms). ) 30 tablet 0 Past Week  at Unknown time  . doxycycline (VIBRA-TABS) 100 MG tablet Take 1 tablet (100 mg total) by mouth 2 (two) times daily. 60 tablet 11 Past Week at Unknown time  . fluticasone (FLONASE) 50 MCG/ACT nasal spray Place 1 spray into the nose daily as needed for allergies.    unknown  . NEOMYCIN-POLYMYXIN-HYDROCORTISONE (CORTISPORIN) 1 % SOLN otic solution Place 2 drops in ear(s) daily. Reported on 11/12/2015   Past Week at Unknown time  . pregabalin (LYRICA) 75 MG capsule Take 75 mg by mouth 2 (two) times daily.    Past Week at Unknown time  . tamsulosin (FLOMAX) 0.4 MG CAPS capsule Take 0.4 mg by mouth.   Past Week at Unknown time   Scheduled: . chlorhexidine gluconate (MEDLINE KIT)  15 mL Mouth Rinse BID  . Chlorhexidine Gluconate Cloth  6 each Topical Daily  . clindamycin (CLEOCIN) IV  600 mg Intravenous Q8H  . doxycycline (VIBRAMYCIN) IV  100 mg Intravenous Q12H  . enoxaparin (LOVENOX) injection  40 mg Subcutaneous Q24H  . IMMUNE GLOBULIN 10% (HUMAN) IV - For Fluid Restriction Only  400 mg/kg Intravenous Q24H  . mouth rinse  15 mL Mouth Rinse BID  . pantoprazole (PROTONIX) IV  40 mg Intravenous Q24H  . polyvinyl alcohol  1 drop Both Eyes TID  . senna-docusate  1 tablet Oral QHS  . sodium chloride flush  10-40 mL Intracatheter Q12H  . tamsulosin  0.4 mg Oral QPC supper  . thiamine  100 mg Oral Daily   Continuous: . 0.9 % NaCl with KCl 40 mEq / L 125 mL/hr at 06/23/16 1600  . fentaNYL infusion INTRAVENOUS 100 mcg/hr (06/24/16 0700)  . 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 He's had agitation which is part of the syndrome. He has what appears to be a left sided aspiration pneumonia which is being treated.  Chest x-ray shows he may have a small pleural effusion as well. I personally reviewed this. He is down to 40% oxygen so he has improved. Principal Problem:   Miller-Fisher variant Guillain-Barre syndrome (HCC) Active Problems:   HTN (hypertension)   Long term current use of antibiotics   Back pain, chronic   Discitis of lumbar region   Nerve disease, peripheral ([place])  Neurogenic urinary bladder disorder   Ataxia   Muscle weakness (generalized)   Ataxic gait   Malnutrition of moderate degree   Palliative care encounter   Goals of care, counseling/discussion   Pressure injury of skin   Urinary tract infection without hematuria    Plan: I agree that long-term a LTAC is going to be good choice for him. He's going to need PEG tube and trach because of the ongoing need for ventilator assistance.    LOS: 21 days   Jacelyn Cuen L 06/24/2016, 8:43 AM

## 2016-06-24 NOTE — Progress Notes (Signed)
PROGRESS NOTE  Marc Rose ATF:[id number] DOB: 11/27/1950 DOA: 06/03/2016 PCP: [name] Patient  Brief Narrative: 66 year old man multiple medical problems presented with worsening bilateral lower extremity weakness resulting in multiple falls and traumatic skin tears on the feet and knees, also reported severe abdominal pain with fullness sensation in lower abdomen. Admitted for UTI, acute kidney injury, generalized weakness. He was evaluated by therapy who recommended skilled nursing facility. 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 He was treated with IVIG. Approximately 1 week into the hospitalization he developed acute encephalopathy with decreased mentation, was transferred to the stepdown unit and started on Precedex. He quickly decompensated and was intubated 2/7. He subsequently developed hypotension requiring vasopressor support, likely etiology being autonomic dysfunction. He was successfully extubated 2/16. For the following 48 hours diet was advanced and he did relatively well. 2/19 developed acute encephalopathy, decreased responsiveness culminating into reintubation on 2/21. Investigation including MRI brain, ABG and chest x-ray were unremarkable. He will eventually need LTAC placement.  Assessment/Plan 1. Acute encephalopathy, persistent. Etiology unclear. Chart reviewed in detail. The patient initially decompensated 2/6 and was subsequently intubated. Status post extubation 2/16 the patient did well from a mentation standpoint until the evening of 2/13. MRI of the brain, ABG and chest x-ray were unremarkable. There is no evidence of infection or metabolic derangement. Lyrica had been restarted status post extubation, however he was on this medication prior to admission and on admission for week, therefore doubt this. No other new medications that would be likely. As noted by neurology, he may simply  be at the nadir of his clinical course.  Supportive care at this point, workup as above negative. Will discontinue Lyrica and scheduled Haldol and quetiapine. Monitor respiratory status closely. 2. [name] variant of GBS, status post IVIG treatment. Associated left facial weakness. Profound generalized weakness. His worsening overall condition may be related to progression of his neurologic illness. Will likely need LTAC placement once stable. Arrangements are being made for LTAC  3. Dysphagia.   Start nutrition by oral gastric tube. Will eventually need PEG tube.  4. Acute hypoxic respiratory failure requiring ventilator support. Patient was intubated from 2/7 - 2/16. He was extubated, but then became increasingly lethargic and went back into respiratory failure requiring reintubation on 2/21. Discussed with Dr. Luan Pulling and he will likely need tracheostomy. 5. Hypotension requiring vasopressor support. Resolved. Suspect autonomic dysfunction. No evidence of sepsis. Echocardiogram was reassuring. 6. Acute kidney injury secondary to prerenal azotemia, resolved. 7. Hyponatremia secondary to hypovolemic status. Resolved. 8. PMH multiple lumbar/spinal surgeries, chronic lumbar discitis and osteomyelitis on suppressive therapy with doxycycline, urinary retention requiring self bladder catheterization since 2014  9. Chronic lower abdominal pain. Stable. CT abdomen and pelvis no acute finding. Protonix for esophagitis. 10. 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 Reported progressive spasticity arms and legs. 11. Peripheral arterial disease. Stable. 12. Status post enterococcus UTI associated with outpatient in/out self bladder catheterizations 13. Moderate malnutrition 14. Manubrial fracture seen on chest CT. Discussed with thoracic surgery Dr. Servando Snare. Recommended activity as tolerated, no heavy lifting,  follow-up chest x-ray with sternal view as an outpatient. No other follow-up recommended at this point. 15. Right upper lobe nodule. Consider chest CT in 12 months. 16. Aortic atherosclerosis 17. Prediabetes. No need for sliding scale given stability of blood sugars.  DVT proph: Lovenox Code Status: full code  Family Communication: As above Disposition Plan: will need LTAC placement  [name], MD  [facility] Direct contact: [phone] --[place] [place]  --[email or web address]; password [email or web address]  7PM-7AM contact night coverage as above 06/24/2016, 6:12 PM  LOS: 21 days   Consultants:  Neurology  Pulmonology  Palliative care  Procedures:  Intubation 2/7>> 2/16, 2/21>>  Lumbar puncture 2/8  EEG IMPRESSION: This is a normal recording of the awake and drowsy states.  Echo Study Conclusions  - Left ventricle: The cavity size was normal. Wall thickness was   increased in a pattern of mild LVH. Systolic function was normal.   The estimated ejection fraction was in the range of 60% to 65%.   Doppler parameters are consistent with abnormal left ventricular   relaxation (grade 1 diastolic dysfunction). - Aortic valve: Calcified non coronary cusp. Valve area (VTI): 1.83   cm^2. Valve area (Vmax): 1.83 cm^2. Valve area (Vmean): 1.84   cm^2. - Atrial septum: No defect or patent foramen ovale was identified. - Pulmonary arteries: PA peak pressure: 39 mm Hg (S). Antimicrobials:  Clindamycin 2/21>>  Interval history/Subjective: Intubated and sedated at this time..   Objective: Vitals:   06/24/16 1600 06/24/16 1611 06/24/16 1700 06/24/16 1705  BP: (!) 146/98  (!) 152/96   Pulse:  (!) 105 95 97  Resp: '14 17 15 14  ' Temp:  97.8 F (36.6 C)    TempSrc:  Axillary    SpO2:  (!) 89% (!) 88% 99%  Weight:      Height:        Intake/Output Summary (Last 24 hours) at 06/24/16 1812 Last data filed at 06/24/16 1727  Gross per 24 hour  Intake          3877.79 ml  Output              2130 ml  Net          1747.79 ml     Filed Weights   06/21/16 0400 06/22/16 0500 06/24/16 0500  Weight: 69.9 kg (154 lb 1.6 oz) 71.9 kg (158 lb 8.2 oz) 67.6 kg (149 lb 0.5 oz)    Exam:    Constitutional. Intubated and sedated  Eyes. Pupils, irises, lids appear unremarkable.  ENT. Lips appear unremarkable, ETT in place  Cardiovascular. RRR. No murmur, rub or gallop. No lower extremity edema.   Respiratory.Clear bilaterally. No frank wheezes, rales or rhonchi. Normal respiratory effort.  Musculoskeletal. No cyanosis or clubbing  Psychiatric. Unable to assess. Sedated  Skin. No change in exam. Multiple abrasions bilateral lower extremities.     Scheduled Meds: . chlorhexidine gluconate (MEDLINE KIT)  15 mL Mouth Rinse BID  . Chlorhexidine Gluconate Cloth  6 each Topical Daily  . clindamycin (CLEOCIN) IV  600 mg Intravenous Q8H  . doxycycline (VIBRAMYCIN) IV  100 mg Intravenous Q12H  . enoxaparin (LOVENOX) injection  40 mg Subcutaneous Q24H  . IMMUNE GLOBULIN 10% (HUMAN) IV - For Fluid Restriction Only  400 mg/kg Intravenous Q24H  . mouth rinse  15 mL Mouth Rinse BID  . pantoprazole (PROTONIX) IV  40 mg Intravenous Q24H  . polyvinyl alcohol  1 drop Both Eyes TID  . senna-docusate  1 tablet Oral QHS  . sodium chloride flush  10-40 mL Intracatheter Q12H  . tamsulosin  0.4 mg Oral QPC supper  . thiamine  100 mg Oral Daily   Continuous Infusions: . 0.9 % NaCl with KCl 40 mEq / L 125 mL/hr (06/24/16 0851)  . fentaNYL  infusion INTRAVENOUS 250 mcg/hr (06/24/16 1559)  . midazolam (VERSED) infusion 3 mg/hr (06/24/16 1538)    Principal Problem:   Miller-Fisher variant Guillain-Barre syndrome (HCC) Active Problems:   HTN (hypertension)   Long term current use of antibiotics   Back pain, chronic   Discitis of lumbar region   Nerve disease, peripheral (HCC)   Neurogenic urinary bladder disorder   Ataxia   Muscle weakness (generalized)   Ataxic gait    Malnutrition of moderate degree   Palliative care encounter   Goals of care, counseling/discussion   Pressure injury of skin   Urinary tract infection without hematuria   DNR (do not resuscitate) discussion   LOS: 21 days

## 2016-06-24 NOTE — Progress Notes (Signed)
Daily Progress Note   Patient Name: Marc Rose       Date: 06/24/2016 DOB: [birth year]  Age: 66 y.o. MRN#: [id number] Attending Physician: [name], MD Primary Care Physician: [name] Patient Admit Date: 06/03/2016  Reason for Consultation/Follow-up: Disposition, Establishing goals of care and Psychosocial/spiritual support  Subjective: [name] is lying quietly in bed. He is intubated/ventilated/sedated. No family at bedside at this time.  Call to sister, Chauncy Lean (She and husband Ray have returned to their home in [place]).  [name] and I talk about Mr. 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  I share my worry regarding lengthy recovery, possible outcomes. [name] states, "he's weak". She tells that her brother, in the past, has had problems that lead to long recovery, and that she feels he may be "worn out".  We talk about code status. I share that we are continuing to do everything we can to support her brother, but I worry about what his future may look like. I encourage [name] to consider, if he has not made improvements in 3 to 4 weeks, consider if they would want CPR/chest compressions. I encourage [name] to consider, "if his heart naturally stops to let nature take its course".  [name] states that she and her husband have been considering this for some time now. [name] tells her story of her experience with her mother's passing in November 2016. [name], unfortunately, does not have the support of Mr. Lofstrom siblings and decision-making for him. [name] does state that she has a strong family  support from her husband, children, and in-laws.  [name] tells me that she called [facility] clerk of court to discuss temporary guardianship. [name] states that she was told she must make a 15 minute video of her brother.  She is concerned over finding his will, advanced directives, and if she should continue to pay his mobile home lot rent for 4 to 6 months.  I share with [name] that I will discuss guardianship with our social workers here, and provide her more information as I could find it.  Length of Stay: 21  Current Medications: Scheduled Meds:  . chlorhexidine gluconate (MEDLINE KIT)  15 mL Mouth  Rinse BID  . Chlorhexidine Gluconate Cloth  6 each Topical Daily  . clindamycin (CLEOCIN) IV  600 mg Intravenous Q8H  . doxycycline (VIBRAMYCIN) IV  100 mg Intravenous Q12H  . enoxaparin (LOVENOX) injection  40 mg Subcutaneous Q24H  . IMMUNE GLOBULIN 10% (HUMAN) IV - For Fluid Restriction Only  400 mg/kg Intravenous Q24H  . mouth rinse  15 mL Mouth Rinse BID  . pantoprazole (PROTONIX) IV  40 mg Intravenous Q24H  . polyvinyl alcohol  1 drop Both Eyes TID  . senna-docusate  1 tablet Oral QHS  . sodium chloride flush  10-40 mL Intracatheter Q12H  . tamsulosin  0.4 mg Oral QPC supper  . thiamine  100 mg Oral Daily    Continuous Infusions: . 0.9 % NaCl with KCl 40 mEq / L 125 mL/hr (06/24/16 0851)  . fentaNYL infusion INTRAVENOUS 30 mcg/hr (06/24/16 1104)  . midazolam (VERSED) infusion 3.5 mg/hr (06/24/16 1111)    PRN Meds: acetaminophen **OR** acetaminophen, bisacodyl, fentaNYL, fentaNYL (SUBLIMAZE) injection, haloperidol lactate, ondansetron **OR** ondansetron (ZOFRAN) IV, sodium chloride flush  Physical Exam  Constitutional: No distress.  Intubated/ventilated/sedated  HENT:  Head: Normocephalic and atraumatic.  Cardiovascular: Normal rate and regular rhythm.   Rate 110's at times  Pulmonary/Chest:  Intubated/ventilated, FiO2 40%  Abdominal: Soft. He exhibits no distension.    OG to intermittent section  Musculoskeletal: He exhibits no edema.  Neurological:  Ventilated/sedated  Nursing note and vitals reviewed.           Vital Signs: BP 97/67 (BP Location: Left Arm)   Pulse (!) 103   Temp 97.9 F (36.6 C) (Axillary)   Resp 20   Ht _0  (1.88 m)   Wt 67.6 kg (149 lb 0.5 oz)   SpO2 (!) 86%   BMI 19.13 kg/m  SpO2: SpO2: (!) 86 % O2 Device: O2 Device: Ventilator O2 Flow Rate: O2 Flow Rate (L/min): 15 L/min  Intake/output summary:  Intake/Output Summary (Last 24 hours) at 06/24/16 1219 Last data filed at 06/24/16 1200  Gross per 24 hour  Intake          4100.52 ml  Output             1880 ml  Net          2220.52 ml   LBM: Last BM Date: 06/20/16 Baseline Weight: Weight: 74.8 kg (165 lb) Most recent weight: Weight: 67.6 kg (149 lb 0.5 oz)       Palliative Assessment/Data:    Flowsheet Rows   Flowsheet Row Most Recent Value  Intake Tab  Referral Department  Hospitalist  Unit at Time of Referral  ICU  Palliative Care Primary Diagnosis  Neurology  Date Notified  06/17/16  Palliative Care Type  New Palliative care  Reason for referral  Clarify Goals of Care  Date of Admission  06/03/16  Date first seen by Palliative Care  06/17/16  # of days Palliative referral response time  0 Day(s)  # of days IP prior to Palliative referral  14  Clinical Assessment  Palliative Performance Scale Score  20%  Pain Max last 24 hours  Not able to report  Pain Min Last 24 hours  Not able to report  Dyspnea Max Last 24 Hours  Not able to report  Dyspnea Min Last 24 hours  Not able to report  Psychosocial & Spiritual Assessment  Palliative Care Outcomes  Patient/Family meeting held?  Yes  Who was at the meeting?  With sister, [name]  Bell, at bedside  Palliative Care Outcomes  Provided psychosocial or spiritual support  Palliative Care follow-up planned  -- [Follow-up while at [facility]]      Patient Active Problem List   Diagnosis Date Noted  . Urinary  tract infection without hematuria   . Pressure injury of skin 06/21/2016  . Palliative care encounter   . Goals of care, counseling/discussion   . 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 Malnutrition of moderate degree 06/10/2016  . Ataxic gait   . Muscle weakness (generalized)   . 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 Drug rash 11/12/2015  . Hyperpigmentation 05/15/2015  . Abdominal pain, right lower quadrant 02/05/2015  . Abscess, psoas ([place]) 02/05/2015  . Acute upper respiratory infection 02/05/2015  . Long term current use of antibiotics 02/05/2015  . Benign essential HTN 02/05/2015  . Edema leg 02/05/2015  . Complex regional pain syndrome type 2 of upper extremity 02/05/2015  . Back pain, chronic 02/05/2015  . CN (constipation) 02/05/2015  . CD (contact dermatitis) 02/05/2015  . Elevated fasting blood sugar 02/05/2015  . LBP (low back pain) 02/05/2015  . Discitis of lumbar region 02/05/2015  . Disorder of nutrition 02/05/2015  . Feeling bilious 02/05/2015  . Flu vaccine need 02/05/2015  . Peripheral neuropathic pain 02/05/2015  . Non compliance with medical treatment 02/05/2015  . Abnormal blood chemistry 02/05/2015  . Angiopathy, peripheral ([place]) 02/05/2015  . Nerve disease, peripheral ([place]) 02/05/2015  . Hypercholesterolemia without hypertriglyceridemia 02/05/2015  . Spinal stenosis 02/05/2015  . Buzzing in ear 02/05/2015  . Compulsive tobacco user syndrome 02/05/2015  . Bladder retention 02/05/2015  . Abnormal weight loss 02/05/2015  . Diskitis 02/05/2015  . Hardware complicating wound infection ([place]) 02/05/2015  . Hyperlipidemia 07/05/2014  . Peripheral arterial disease ([place]) 07/05/2014  . Hypotension 09/16/2013  . HTN (hypertension) 09/16/2013  . Anxiety 09/16/2013  . Back pain 09/16/2013    Palliative Care Assessment & Plan   Patient Profile: 66 y.o.malewith past medical history of arthritis,  benign neoplasm of colon, chronic back and leg pain, diabetes, depression, hypertension, PAD, tobacco use disorderadmitted on 2/1/2018with UTI, Miller-Fisher variant Guillain-Barre syndrome.  Intubated upon admission, extubated 2/16, reintubated 2/21.  Assessment: Miller-Fisher variant Guillain-Barre syndrome:Long recovery anticipated. Completed 5 day course of IV IG. PT and OT. Usual course worsens for 4 weeks (likely at nadir of deterioration now)and then gradually improves over 3-4 months. Difficulty breathing, question if paralysis has ascended to the diaphragm, reintubated 2/21. Encephalopathy;unknown source, may simply be at the nadir of his clinical course, aspiration.  Recommendations/Plan: Mr. Vanallen will need LTAC, and months of rehabilitation.   Goals of Care and Additional Recommendations:  Limitations on Scope of Treatment: Full Scope Treatment and We discuss code status today, I encourage sister to consent to future choices.  Code Status:    Code Status Orders        Start     Ordered   06/03/16 1518  Full code  Continuous     06/03/16 1517    Code Status History    Date Active Date Inactive Code Status Order ID Comments User Context   09/16/2013 10:03 PM 09/19/2013  4:23 PM Full Code [id number]  Doree Albee, MD ED    Advance Directive Documentation   [place] Most Recent Value  Type of Advance Directive  Living will  Pre-existing out of facility DNR order (yellow form or pink MOST form)  No data  "MOST" Form in  Place?  No data       Prognosis:   Unable to determine, based on outcomes  Discharge Planning:  .Family is requesting [facility], LTAC for trach/PEG rehab.   Care plan was discussed with nursing staff, case manager, social worker, and Dr. Roderic Palau.   Thank you for allowing the Palliative Medicine Team to assist in the care of this patient.   Time In: 1005 Time Out: 1045 Total Time 40 minutes Prolonged Time Billed  no       Greater than  50%  of this time was spent counseling and coordinating care related to the above assessment and plan.  Drue Novel, NP  Please contact Palliative Medicine Team phone at [phone] for questions and concerns.

## 2016-06-24 NOTE — Clinical Social Work Note (Signed)
CSW called Manuela Schwartz at [facility] to notify her of LTAC authorization request. Will continue to follow.  Benay Pike, [place]

## 2016-06-24 NOTE — Care Management Note (Signed)
Case Management Note  Patient Details  Name: Marc Rose MRN: [id number] Date of Birth: 11-Apr-1951  If discussed at [facility] Length of Stay Meetings, dates discussed:  06/24/2016   Kaveh Kissinger, Chauncey Reading, RN 06/24/2016, 4:09 PM

## 2016-06-25 ENCOUNTER — Inpatient Hospital Stay (HOSPITAL_COMMUNITY): Payer: PPO

## 2016-06-25 DIAGNOSIS — N390 Urinary tract infection, site not specified: Secondary | ICD-10-CM | POA: Diagnosis not present

## 2016-06-25 DIAGNOSIS — E870 Hyperosmolality and hypernatremia: Secondary | ICD-10-CM | POA: Diagnosis not present

## 2016-06-25 DIAGNOSIS — J9811 Atelectasis: Secondary | ICD-10-CM | POA: Diagnosis not present

## 2016-06-25 DIAGNOSIS — J9690 Respiratory failure, unspecified, unspecified whether with hypoxia or hypercapnia: Secondary | ICD-10-CM | POA: Diagnosis not present

## 2016-06-25 DIAGNOSIS — Z9911 Dependence on respirator [ventilator] status: Secondary | ICD-10-CM | POA: Diagnosis not present

## 2016-06-25 DIAGNOSIS — J151 Pneumonia due to Pseudomonas: Secondary | ICD-10-CM | POA: Diagnosis not present

## 2016-06-25 DIAGNOSIS — E872 Acidosis: Secondary | ICD-10-CM | POA: Diagnosis not present

## 2016-06-25 DIAGNOSIS — I10 Essential (primary) hypertension: Secondary | ICD-10-CM | POA: Diagnosis not present

## 2016-06-25 DIAGNOSIS — K922 Gastrointestinal hemorrhage, unspecified: Secondary | ICD-10-CM | POA: Diagnosis not present

## 2016-06-25 DIAGNOSIS — G9341 Metabolic encephalopathy: Secondary | ICD-10-CM | POA: Diagnosis not present

## 2016-06-25 DIAGNOSIS — Y833 Surgical operation with formation of external stoma as the cause of abnormal reaction of the patient, or of later complication, without mention of misadventure at the time of the procedure: Secondary | ICD-10-CM | POA: Diagnosis not present

## 2016-06-25 DIAGNOSIS — K311 Adult hypertrophic pyloric stenosis: Secondary | ICD-10-CM | POA: Diagnosis not present

## 2016-06-25 DIAGNOSIS — J9621 Acute and chronic respiratory failure with hypoxia: Secondary | ICD-10-CM | POA: Diagnosis not present

## 2016-06-25 DIAGNOSIS — R1311 Dysphagia, oral phase: Secondary | ICD-10-CM | POA: Diagnosis not present

## 2016-06-25 DIAGNOSIS — G92 Toxic encephalopathy: Secondary | ICD-10-CM | POA: Diagnosis not present

## 2016-06-25 DIAGNOSIS — R339 Retention of urine, unspecified: Secondary | ICD-10-CM | POA: Diagnosis not present

## 2016-06-25 DIAGNOSIS — I959 Hypotension, unspecified: Secondary | ICD-10-CM | POA: Diagnosis not present

## 2016-06-25 DIAGNOSIS — F331 Major depressive disorder, recurrent, moderate: Secondary | ICD-10-CM | POA: Diagnosis not present

## 2016-06-25 DIAGNOSIS — M869 Osteomyelitis, unspecified: Secondary | ICD-10-CM | POA: Diagnosis not present

## 2016-06-25 DIAGNOSIS — J690 Pneumonitis due to inhalation of food and vomit: Secondary | ICD-10-CM | POA: Diagnosis not present

## 2016-06-25 DIAGNOSIS — E1143 Type 2 diabetes mellitus with diabetic autonomic (poly)neuropathy: Secondary | ICD-10-CM | POA: Diagnosis not present

## 2016-06-25 DIAGNOSIS — A4102 Sepsis due to Methicillin resistant Staphylococcus aureus: Secondary | ICD-10-CM | POA: Diagnosis not present

## 2016-06-25 DIAGNOSIS — I5033 Acute on chronic diastolic (congestive) heart failure: Secondary | ICD-10-CM | POA: Diagnosis not present

## 2016-06-25 DIAGNOSIS — G610 Guillain-Barre syndrome: Secondary | ICD-10-CM | POA: Diagnosis not present

## 2016-06-25 DIAGNOSIS — E1136 Type 2 diabetes mellitus with diabetic cataract: Secondary | ICD-10-CM | POA: Diagnosis not present

## 2016-06-25 DIAGNOSIS — Z515 Encounter for palliative care: Secondary | ICD-10-CM | POA: Diagnosis not present

## 2016-06-25 DIAGNOSIS — G909 Disorder of the autonomic nervous system, unspecified: Secondary | ICD-10-CM | POA: Diagnosis not present

## 2016-06-25 DIAGNOSIS — E43 Unspecified severe protein-calorie malnutrition: Secondary | ICD-10-CM | POA: Diagnosis not present

## 2016-06-25 DIAGNOSIS — Y95 Nosocomial condition: Secondary | ICD-10-CM | POA: Diagnosis not present

## 2016-06-25 DIAGNOSIS — Y92239 Unspecified place in hospital as the place of occurrence of the external cause: Secondary | ICD-10-CM | POA: Diagnosis not present

## 2016-06-25 DIAGNOSIS — R6521 Severe sepsis with septic shock: Secondary | ICD-10-CM | POA: Diagnosis not present

## 2016-06-25 LAB — BLOOD GAS, ARTERIAL
ACID-BASE EXCESS: 0.9 mmol/L (ref 0.0–2.0)
ALLENS TEST (PASS/FAIL): POSITIVE — AB
BICARBONATE: 24.8 mmol/L (ref 20.0–28.0)
Drawn by: 382351
FIO2: 40
LHR: 14 {breaths}/min
O2 Saturation: 96.7 %
PEEP/CPAP: 5 cmH2O
Patient temperature: 37
VT: 550 mL
pCO2 arterial: 45.4 mmHg (ref 32.0–48.0)
pH, Arterial: 7.37 (ref 7.350–7.450)
pO2, Arterial: 97.2 mmHg (ref 83.0–108.0)

## 2016-06-25 LAB — GLUCOSE, CAPILLARY
GLUCOSE-CAPILLARY: 101 mg/dL — AB (ref 65–99)
GLUCOSE-CAPILLARY: 93 mg/dL (ref 65–99)
Glucose-Capillary: 101 mg/dL — ABNORMAL HIGH (ref 65–99)
Glucose-Capillary: 76 mg/dL (ref 65–99)
Glucose-Capillary: 86 mg/dL (ref 65–99)

## 2016-06-25 LAB — BASIC METABOLIC PANEL
ANION GAP: 5 (ref 5–15)
BUN: 9 mg/dL (ref 6–20)
CHLORIDE: 104 mmol/L (ref 101–111)
CO2: 26 mmol/L (ref 22–32)
Calcium: 8.4 mg/dL — ABNORMAL LOW (ref 8.9–10.3)
Creatinine, Ser: 0.53 mg/dL — ABNORMAL LOW (ref 0.61–1.24)
GFR calc non Af Amer: >60 mL/min (ref >60)
GLUCOSE: 103 mg/dL — AB (ref 65–99)
Potassium: 4.2 mmol/L (ref 3.5–5.1)
Sodium: 135 mmol/L (ref 135–145)

## 2016-06-25 LAB — CBC
HEMATOCRIT: 30.8 % — AB (ref 39.0–52.0)
HEMOGLOBIN: 10.5 g/dL — AB (ref 13.0–17.0)
MCH: 32.8 pg (ref 26.0–34.0)
MCHC: 34.1 g/dL (ref 30.0–36.0)
MCV: 96.3 fL (ref 78.0–100.0)
Platelets: 431 10*3/uL — ABNORMAL HIGH (ref 150–400)
RBC: 3.2 MIL/uL — ABNORMAL LOW (ref 4.22–5.81)
RDW: 15.8 % — ABNORMAL HIGH (ref 11.5–15.5)
WBC: 8 10*3/uL (ref 4.0–10.5)

## 2016-06-25 MED ORDER — PANTOPRAZOLE SODIUM 40 MG IV SOLR: 40.0000 mg | INTRAVENOUS | Status: DC

## 2016-06-25 MED ORDER — FENTANYL CITRATE (PF) 2500 MCG/50ML IJ SOLN
INTRAMUSCULAR | Status: AC
  Filled 2016-06-25: qty 50

## 2016-06-25 MED ORDER — FENTANYL CITRATE (PF) 2500 MCG/50ML IJ SOLN: 150.0000 ug/h | INTRAMUSCULAR | Status: DC

## 2016-06-25 MED ORDER — SODIUM CHLORIDE 0.9 % IV BOLUS (SEPSIS)
500.0000 mL | Freq: Once | INTRAVENOUS | Status: AC
  Administered 2016-06-25: 500 mL via INTRAVENOUS

## 2016-06-25 MED ORDER — SODIUM CHLORIDE 0.9 % IV SOLN: 0.5000 mg/h | INTRAVENOUS | Status: DC

## 2016-06-25 MED ORDER — FENTANYL CITRATE (PF) 100 MCG/2ML IJ SOLN: 25.0000 ug | INTRAMUSCULAR | 0 refills | Status: DC | PRN

## 2016-06-25 MED ORDER — THIAMINE HCL 100 MG PO TABS: 100.0000 mg | ORAL_TABLET | Freq: Every day | ORAL | Status: DC

## 2016-06-25 MED ORDER — SODIUM CHLORIDE 0.9 % IV BOLUS (SEPSIS)
1000.0000 mL | Freq: Once | INTRAVENOUS | Status: AC
  Administered 2016-06-25: 1000 mL via INTRAVENOUS

## 2016-06-25 MED ORDER — POLYVINYL ALCOHOL 1.4 % OP SOLN: 1.0000 [drp] | Freq: Three times a day (TID) | OPHTHALMIC | 0 refills | Status: AC

## 2016-06-25 MED ORDER — IMMUNE GLOBULIN (HUMAN) 10 GM/100ML IV SOLN: 400.0000 mg/kg | INTRAVENOUS | Status: DC

## 2016-06-25 MED ORDER — ENOXAPARIN SODIUM 40 MG/0.4ML ~~LOC~~ SOLN: 40.0000 mg | SUBCUTANEOUS | Status: DC

## 2016-06-25 MED ORDER — CLINDAMYCIN PHOSPHATE 600 MG/50ML IV SOLN: 600.0000 mg | Freq: Three times a day (TID) | INTRAVENOUS | Status: DC

## 2016-06-25 MED ORDER — POTASSIUM CHLORIDE IN NACL 40-0.9 MEQ/L-% IV SOLN: 100.0000 mL/h | INTRAVENOUS | Status: DC

## 2016-06-25 MED ORDER — SENNOSIDES-DOCUSATE SODIUM 8.6-50 MG PO TABS: 1.0000 | ORAL_TABLET | Freq: Every day | ORAL | Status: DC

## 2016-06-25 MED ORDER — HALOPERIDOL LACTATE 5 MG/ML IJ SOLN: 5.0000 mg | Freq: Four times a day (QID) | INTRAMUSCULAR | Status: DC | PRN

## 2016-06-25 MED ORDER — ONDANSETRON HCL 4 MG PO TABS: 4.0000 mg | ORAL_TABLET | Freq: Four times a day (QID) | ORAL | 0 refills | Status: AC | PRN

## 2016-06-25 NOTE — Discharge Summary (Signed)
Physician Discharge Summary  Marc Rose [id number] DOB: Mar 17, 1951 DOA: 06/03/2016  PCP: [name] Patient  Admit date: 06/03/2016 Discharge date: 06/25/2016  Admitted From: home Disposition:  [facility]  Recommendations for Outpatient Follow-up:  1. Patient will be discharged to [facility] for further care  Discharge Condition:stable CODE STATUS: full code Diet recommendation: NPO, on tube feeding  Brief/Interim Summary: 66 year old man multiple medical problems presented with worsening bilateral lower extremity weakness resulting in multiple falls and traumatic skin tears on the feet and knees, also reported severe abdominal pain with fullness sensation in lower abdomen. Admitted for UTI, acute kidney injury, generalized weakness. He was evaluated by therapy who recommended skilled nursing facility. 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 He was treated with a 5 day course of IVIG. Approximately 1 week into the hospitalization he developed acute encephalopathy with decreased mentation, was transferred to the stepdown unit and started on Precedex. He quickly decompensated and was intubated 2/7. He subsequently developed hypotension requiring vasopressor support, likely etiology being autonomic dysfunction. He was successfully extubated 2/16. For the following 48 hours, his diet was advanced and he did relatively well. On 2/19, he developed acute encephalopathy, decreased responsiveness culminating into reintubation on 2/21. Investigation including MRI brain, ABG, which were unremarkable. Chest xray indicated possible aspiration pneumonia and he was started on clindamycin. After discussing his care with neurology and pulmonology, it was felt that with patient's ongoing neurologic issues, he will likely need tracheostomy and PEG tube placement. His recovery will likely take several weeks to months.  Plan is for transfer to [facility] for further management.  Discharge Diagnoses:  Principal Problem:   Miller-Fisher variant Guillain-Barre syndrome (HCC) Active Problems:   HTN (hypertension)   Long term current use of antibiotics   Back pain, chronic   Discitis of lumbar region   Nerve disease, peripheral (HCC)   Neurogenic urinary bladder disorder   Ataxia   Muscle weakness (generalized)   Ataxic gait   Malnutrition of moderate degree   Palliative care encounter   Goals of care, counseling/discussion   Pressure injury of skin   Urinary tract infection without hematuria   DNR (do not resuscitate) discussion   1. Acute encephalopathy, persistent. Etiology unclear. Chart reviewed in detail. The patient initially decompensated 2/6 and was subsequently intubated. Status post extubation 2/16, the patient did well from a mentation standpoint until the evening of 2/13. MRI of the brain, ABG  were unremarkable.  As noted by neurology, he may simply be at the nadir of his clinical course.  Supportive care at this point, workup as above negative. Outpatient medication such as Lyrica haven't discontinued. He was also taking Flexeril which has been discontinued. Currently on Haldol for agitation. 2. [name] variant of GBS, status post IVIG treatment. Associated left facial weakness. Profound generalized weakness. His worsening overall condition may be related to progression of his neurologic illness. Since patient required reintubation, neurology has started him on a second course of IVIG. He will complete a five-day course of IVIG on 2/26. Plan is to arrange transfer to [place] since he will need tracheostomy and PEG tube placement  3. Dysphagia.   Start nutrition by oral gastric tube. Will eventually need PEG tube.  4. Acute hypoxic respiratory failure requiring ventilator support. Patient was intubated from 2/7 - 2/16. He was extubated, but then became increasingly lethargic and went back  into respiratory failure requiring reintubation on 2/21. Discussed  with Dr. Luan Pulling and his respiratory issues are felt to be related to his neurologic illness. he will likely need tracheostomy. 5. Hypotension requiring vasopressor support. Resolved. Suspect autonomic dysfunction. No evidence of sepsis. Echocardiogram was reassuring. 6. Acute kidney injury secondary to prerenal azotemia, resolved. 7. Hyponatremia secondary to hypovolemic status. Resolved. 8. PMH multiple lumbar/spinal surgeries, chronic lumbar discitis and osteomyelitis on suppressive therapy with doxycycline, urinary retention requiring self bladder catheterization since 2014 . Patient has chronically been taking doxycycline for lumbar discitis. He is followed by infectious disease in [place]. Consider restarting this on discharge. 9. Chronic lower abdominal pain. Stable. CT abdomen and pelvis no acute finding. Protonix for esophagitis. 10. 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 Reported progressive spasticity arms and legs. 11. Peripheral arterial disease. Stable. 12. Status post enterococcus UTI associated with outpatient in/out self bladder catheterizations 13. Moderate malnutrition. On tube feeding. 14. Manubrial fracture seen on chest CT. Discussed with thoracic surgery Dr. Servando Snare. Recommended activity as tolerated, no heavy lifting, follow-up chest x-ray with sternal view as an outpatient. No other follow-up recommended at this point. 15. Right upper lobe nodule. Consider chest CT in 12 months. 16. Aortic atherosclerosis 17. Prediabetes. No need for sliding scale given stability of blood sugars. 18. Aspiration pneumonia. Once patient was reintubated, chest x-ray indicated a possible pneumonia and started on clindamycin. He will complete a total 7 days on 2/27   Discharge Instructions  Discharge Instructions    Diet - low sodium  heart healthy    Complete by:  As directed    Increase activity slowly    Complete by:  As directed      Allergies as of 06/25/2016      Reactions   Codeine Anaphylaxis, Hives, Swelling   Penicillins Anaphylaxis, Hives, Swelling   Latex Itching   Strawberry Extract Hives      Medication List    STOP taking these medications   benazepril 40 MG tablet Commonly known as:  LOTENSIN   CORTISPORIN 1 % Soln otic solution Generic drug:  NEOMYCIN-POLYMYXIN-HYDROCORTISONE   cyclobenzaprine 10 MG tablet Commonly known as:  FLEXERIL   diazepam 10 MG tablet Commonly known as:  VALIUM   doxycycline 100 MG tablet Commonly known as:  VIBRA-TABS   LYRICA 75 MG capsule Generic drug:  pregabalin     TAKE these medications   0.9 % NaCl with KCl 40 mEq / L 40-0.9 MEQ/L-% Inject 100 mL/hr into the vein continuous.   aspirin EC 81 MG tablet Take 81 mg by mouth daily.   clindamycin 600 MG/50ML IVPB Commonly known as:  CLEOCIN Inject 50 mLs (600 mg total) into the vein every 8 (eight) hours. Until 2/27   enoxaparin 40 MG/0.4ML injection Commonly known as:  LOVENOX Inject 0.4 mLs (40 mg total) into the skin daily. Start taking on:  06/26/2016   fentaNYL 100 MCG/2ML injection Commonly known as:  SUBLIMAZE Inject 0.5-2 mLs (25-100 mcg total) into the vein every 2 (two) hours as needed for moderate pain or severe pain (sedation. RASS goal = 0.).   fentaNYL 2,500 mcg in sodium chloride 0.9 % 200 mL Inject 150 mcg/hr into the vein continuous.   fluticasone 50 MCG/ACT nasal spray Commonly known as:  FLONASE Place 1 spray into the nose daily as needed for allergies.   haloperidol lactate 5 MG/ML injection Commonly known as:  HALDOL Inject 1 mL (5 mg total) into the vein every 6 (  six) hours as needed.   Immune Globulin 10% 10 GM/100ML Soln Commonly known as:  PRIVIGEN Inject 30 g into the vein daily. Until 2/26 Start taking on:  06/26/2016   midazolam 50 mg in sodium chloride 0.9 %  40 mL Inject 0.5-8 mg/hr into the vein continuous.   ondansetron 4 MG tablet Commonly known as:  ZOFRAN Place 1 tablet (4 mg total) into feeding tube every 6 (six) hours as needed for nausea.   pantoprazole 40 MG injection Commonly known as:  PROTONIX Inject 40 mg into the vein daily. Start taking on:  06/26/2016   polyvinyl alcohol 1.4 % ophthalmic solution Commonly known as:  LIQUIFILM TEARS Place 1 drop into both eyes 3 (three) times daily.   senna-docusate 8.6-50 MG tablet Commonly known as:  Senokot-S Place 1 tablet into feeding tube at bedtime.   tamsulosin 0.4 MG Caps capsule Commonly known as:  FLOMAX Take 0.4 mg by mouth.   thiamine 100 MG tablet Place 1 tablet (100 mg total) into feeding tube daily. Start taking on:  06/26/2016       Allergies  Allergen Reactions  . Codeine Anaphylaxis, Hives and Swelling  . Penicillins Anaphylaxis, Hives and Swelling  . Latex Itching  . Strawberry Extract Hives    Consultations:  Neurology  Pulmonology  Palliative care   Procedures/Studies: Ct Abdomen Pelvis Wo Contrast  Result Date: 06/03/2016 CLINICAL DATA:  Urinary retention with self bladder catheterization to since 2014. Lower extremity weakness. Multiple falls. Lower abdominal pain starting last night. EXAM: CT ABDOMEN AND PELVIS WITHOUT CONTRAST TECHNIQUE: Multidetector CT imaging of the abdomen and pelvis was performed following the standard protocol without IV contrast. COMPARISON:  08/24/2013 FINDINGS: Lower chest: Left base atelectasis. Normal heart size. Persistent distal esophageal wall thickening. Normal heart size without pericardial or pleural effusion. Hepatobiliary: Multifactorial degradation, including lack of IV contrast extensive beam hardening artifact from lumbar spine fixation. Mild hepatic steatosis. Grossly normal gallbladder, without biliary duct dilatation. Pancreas: Normal, without mass or ductal dilatation. Spleen: Normal in size, without focal  abnormality. Adrenals/Urinary Tract: Grossly normal adrenal glands. No renal calculi or hydronephrosis. The urinary bladder is positioned eccentric left, primarily decompressed around a Foley catheter. No pericystic fluid identified. Stomach/Bowel: Normal remainder of the stomach. Colonic stool burden suggests constipation. The cecum is positioned within the central pelvis. Appendix not visualized. Normal small bowel caliber. Vascular/Lymphatic: Aortic and branch vessel atherosclerosis. Right greater than left common iliac artery dilatation is again identified. Example at 2.1 cm. No gross abdominal adenopathy. No pelvic sidewall adenopathy. Reproductive: Mild prostatomegaly. Other: No significant free fluid. Musculoskeletal: Osteopenia. Lumbosacral spine fixation from T10 through L4. IMPRESSION: 1. Moderate multifactorial degradation, as detailed above. 2. No gross acute finding in the abdomen or pelvis. 3.  Possible constipation. 4. Common iliac artery enlargement, worse on the right, similar. 5. Persistent distal esophageal wall thickening, suggesting esophagitis. Electronically Signed   By: Abigail Miyamoto M.D.   On: 06/03/2016 17:41   Dg Chest 1 View  Result Date: 06/23/2016 CLINICAL DATA:  ET tube placement EXAM: CHEST 1 VIEW COMPARISON:  06/21/2016 FINDINGS: Endotracheal tube tip is approximately 5.6 cm superior to the carina. Esophageal tube tip appears to terminate at the distal esophagus. Right upper extremity catheter tip overlies the SVC. A right lung is grossly clear. Interval consolidation of left lung base with tiny left effusion. Stable heart size. IMPRESSION: 1. Endotracheal tube tip 5.6 cm superior to carina 2. Esophageal tube tip appears to terminate at the level of  distal esophagus, recommend further advancement for more optimal positioning 3. Interval consolidation of the left lung base with presence of tiny left effusion Electronically Signed   By: Donavan Foil M.D.   On: 06/23/2016 03:22    Dg Chest 1 View  Result Date: 06/16/2016 CLINICAL DATA:  Status post re-intubation EXAM: CHEST 1 VIEW COMPARISON:  06/16/2016 FINDINGS: Endotracheal tube is again noted approximately 7.2 cm above the carina. The right-sided PICC line and nasogastric catheter are again seen stable. Postsurgical changes in the thoracolumbar spine are seen. The lungs are well aerated bilaterally. No acute bony abnormality is seen. IMPRESSION: No acute abnormality noted. Electronically Signed   By: Inez Catalina M.D.   On: 06/16/2016 09:34   Dg Chest 1 View  Result Date: 06/03/2016 CLINICAL DATA:  Increased weakness x 2 weeks. Multiple falls. Hx of smoking and htn. EXAM: CHEST 1 VIEW COMPARISON:  09/16/2013 FINDINGS: There is a well demarcated opacity projecting in the left mid to lower lung, new since the prior exam. There are lung markings at extending peripheral to this. This does not appear to be a pneumothorax although potentially could reflect partial collapse of the lingular segment of the left upper lobe. This could reflect a pleural based opacity. Remainder of the lungs is clear.  Lungs are mildly hyperexpanded. No pleural effusion.  No pneumothorax. Cardiac silhouette is normal in size. No mediastinal or hilar masses or convincing adenopathy. Posterior fusion from the lower thoracic to the lumbar spine, incompletely imaged, is new since the prior study. IMPRESSION: 1. Opacity projecting in the left mid to lower lung, new since the prior exam. Although new since the prior study, this still may reflect a chronic finding. This is unlikely to reflect pneumonia. This could be further assessed with either chest CT or PA and lateral views of the chest. 2. No other lung opacities. Electronically Signed   By: Lajean Manes M.D.   On: 06/03/2016 11:22   Dg Chest 2 View  Result Date: 06/04/2016 CLINICAL DATA:  Abnormal chest x-ray. EXAM: CHEST  2 VIEW COMPARISON:  Radiograph of June 03, 2016. FINDINGS: The heart size and  mediastinal contours are within normal limits. Both lungs are clear. No pneumothorax or pleural effusion is noted. Probable minimally displaced manubrial fracture is noted on lateral projection. IMPRESSION: No active cardiopulmonary disease. Probable minimally displaced manubrial fracture is noted on lateral projection. Clinical correlation is recommended. Electronically Signed   By: Marijo Conception, M.D.   On: 06/04/2016 09:24   Ct Angio Chest Pe W Or Wo Contrast  Result Date: 06/06/2016 CLINICAL DATA:  Tachycardia EXAM: CT ANGIOGRAPHY CHEST WITH CONTRAST TECHNIQUE: Multidetector CT imaging of the chest was performed using the standard protocol during bolus administration of intravenous contrast. Multiplanar CT image reconstructions and MIPs were obtained to evaluate the vascular anatomy. CONTRAST:  100 mL Isovue 370. COMPARISON:  None. FINDINGS: Cardiovascular: Thoracic aorta shows no aneurysmal dilatation or dissection. The pulmonary artery as visualized without filling defect to suggest pulmonary embolus. Mild coronary calcifications are seen. No significant cardiac enlargement is noted. Mediastinum/Nodes: The thoracic inlet is within normal limits. Pretracheal lesion is noted best seen on image number 114 of series 13 measuring 17 mm in short axis. Scattered small hilar lymph nodes are seen. No other significant mediastinal lymph node is noted. Lungs/Pleura: Lungs are well aerated bilaterally. A 5 mm right upper lobe subpleural nodule is noted best seen on image number 41 of series 6. No other nodules are seen. Left  lower lobe infiltrate with associated effusion is seen. Upper Abdomen: No acute abnormality. Musculoskeletal: Degenerative change of the thoracic spine is noted. Postsurgical changes in the lower thoracic and upper lumbar spine are noted. Changes of minimally displaced manubrial fracture are noted. Review of the MIP images confirms the above findings. IMPRESSION: Manubrial fracture No evidence of  pulmonary emboli. Left lower lobe infiltrate and effusion. Tiny subpleural nodule in the right upper lobe. No follow-up needed if patient is low-risk. Non-contrast chest CT can be considered in 12 months if patient is high-risk. This recommendation follows the consensus statement: Guidelines for Management of Incidental Pulmonary Nodules Detected on CT Images: From the Fleischner Society 2017; Radiology 2017; 284:228-243. Electronically Signed   By: Inez Catalina M.D.   On: 06/06/2016 17:48   Mr Jodene Nam Head Wo Contrast  Result Date: 06/21/2016 CLINICAL DATA:  Left facial droop. EXAM: MRI HEAD WITHOUT CONTRAST MRA HEAD WITHOUT CONTRAST TECHNIQUE: Multiplanar, multiecho pulse sequences of the brain and surrounding structures were obtained without intravenous contrast. Angiographic images of the head were obtained using MRA technique without contrast. COMPARISON:  Brain MRI 06/06/2016 FINDINGS: MRI HEAD FINDINGS Axial and coronal T2 sequences are severely motion degraded. There is mild motion artifact on other sequences. Brain: There is no evidence of acute infarct, intracranial hemorrhage, mass, midline shift, or extra-axial fluid collection. Mild generalized cerebral atrophy is within normal limits for age. No significant cerebral white matter disease is seen for age. Vascular: Major intracranial vascular flow voids not well assessed due to motion. Skull and upper cervical spine: No gross osseous lesion. Sinuses/Orbits: No gross abnormality. Other: None. MRA HEAD FINDINGS The visualized distal vertebral arteries are widely patent and codominant. The basilar artery is widely patent. Posterior communicating arteries are diminutive or absent. PCAs are patent without evidence of significant stenosis. Internal carotid arteries are patent from skullbase to carotid termini without evidence of significant stenosis. ACAs and MCAs are patent without evidence of major branch occlusion or significant proximal stenosis allowing  for mild motion artifact. No intracranial aneurysm is identified. IMPRESSION: 1. Motion degraded examination without evidence of acute intracranial abnormality. 2. Negative head MRA. Electronically Signed   By: Logan Bores M.D.   On: 06/21/2016 12:12   Mr Brain Wo Contrast  Result Date: 06/21/2016 CLINICAL DATA:  Left facial droop. EXAM: MRI HEAD WITHOUT CONTRAST MRA HEAD WITHOUT CONTRAST TECHNIQUE: Multiplanar, multiecho pulse sequences of the brain and surrounding structures were obtained without intravenous contrast. Angiographic images of the head were obtained using MRA technique without contrast. COMPARISON:  Brain MRI 06/06/2016 FINDINGS: MRI HEAD FINDINGS Axial and coronal T2 sequences are severely motion degraded. There is mild motion artifact on other sequences. Brain: There is no evidence of acute infarct, intracranial hemorrhage, mass, midline shift, or extra-axial fluid collection. Mild generalized cerebral atrophy is within normal limits for age. No significant cerebral white matter disease is seen for age. Vascular: Major intracranial vascular flow voids not well assessed due to motion. Skull and upper cervical spine: No gross osseous lesion. Sinuses/Orbits: No gross abnormality. Other: None. MRA HEAD FINDINGS The visualized distal vertebral arteries are widely patent and codominant. The basilar artery is widely patent. Posterior communicating arteries are diminutive or absent. PCAs are patent without evidence of significant stenosis. Internal carotid arteries are patent from skullbase to carotid termini without evidence of significant stenosis. ACAs and MCAs are patent without evidence of major branch occlusion or significant proximal stenosis allowing for mild motion artifact. No intracranial aneurysm is identified. IMPRESSION: 1.  Motion degraded examination without evidence of acute intracranial abnormality. 2. Negative head MRA. Electronically Signed   By: Logan Bores M.D.   On: 06/21/2016  12:12   Mr Brain Wo Contrast  Result Date: 06/06/2016 CLINICAL DATA:  66 year old male with bilateral upper extremity weakness. Initial encounter. EXAM: MRI HEAD WITHOUT CONTRAST TECHNIQUE: Multiplanar, multiecho pulse sequences of the brain and surrounding structures were obtained without intravenous contrast. COMPARISON:  None. FINDINGS: Brain: Cerebral volume is within normal limits for age. No restricted diffusion to suggest acute infarction. No midline shift, mass effect, evidence of mass lesion, ventriculomegaly, extra-axial collection or acute intracranial hemorrhage. Cervicomedullary junction and pituitary are within normal limits. Pearline Cables and white matter signal is within normal limits for age throughout the brain. No cortical encephalomalacia or chronic cerebral blood products. Vascular: Major intracranial vascular flow voids are preserved. Skull and upper cervical spine: Negative. Normal bone marrow signal. Sinuses/Orbits: Normal orbits soft tissues. Trace paranasal sinus mucosal thickening. Other: Visible internal auditory structures appear normal. Mastoids are clear. Negative scalp soft tissues. IMPRESSION: No acute intracranial abnormality. Normal for age noncontrast MRI appearance of the brain. Electronically Signed   By: Genevie Ann M.D.   On: 06/06/2016 12:22   US Renal  Result Date: 06/04/2016 CLINICAL DATA:  Flaccid neurogenic bladder, acute urinary retention diabetes mellitus, essential benign hypertension EXAM: RENAL / URINARY TRACT ULTRASOUND COMPLETE COMPARISON:  CT abdomen and pelvis 06/03/2016 FINDINGS: Right Kidney: Length: 10.5 cm. Normal cortical thickness and echogenicity. No mass, hydronephrosis or shadowing calcification. Left Kidney: Length: 11.7 cm. Normal cortical thickness and echogenicity. No mass, hydronephrosis or shadowing calcification. Bladder: Decompressed by Foley catheter, unable to evaluate IMPRESSION: Sonographically unremarkable kidneys. Electronically Signed   By: Lavonia Dana M.D.   On: 06/04/2016 16:13   US Venous Img Upper Uni Left  Result Date: 06/13/2016 CLINICAL DATA:  Left upper extremity edema. EXAM: LEFT UPPER EXTREMITY VENOUS DOPPLER ULTRASOUND TECHNIQUE: Gray-scale sonography with graded compression, as well as color Doppler and duplex ultrasound were performed to evaluate the upper extremity deep venous system from the level of the subclavian vein and including the jugular, axillary, basilic, radial, ulnar and upper cephalic vein. Spectral Doppler was utilized to evaluate flow at rest and with distal augmentation maneuvers. COMPARISON:  None. FINDINGS: Contralateral Subclavian Vein: Respiratory phasicity is normal and symmetric with the symptomatic side. No evidence of thrombus. Normal compressibility. Internal Jugular Vein: No evidence of thrombus. Normal compressibility, respiratory phasicity and response to augmentation. Subclavian Vein: No evidence of thrombus. Normal compressibility, respiratory phasicity and response to augmentation. Axillary Vein: No evidence of thrombus. Normal compressibility, respiratory phasicity and response to augmentation. Cephalic Vein: No evidence of thrombus. Normal compressibility, respiratory phasicity and response to augmentation. Basilic Vein: No evidence of thrombus. Normal compressibility, respiratory phasicity and response to augmentation. Brachial Veins: No evidence of thrombus. Normal compressibility, respiratory phasicity and response to augmentation. Radial Veins: No evidence of thrombus. Normal compressibility, respiratory phasicity and response to augmentation. Ulnar Veins: No evidence of thrombus. Normal compressibility, respiratory phasicity and response to augmentation. Venous Reflux:  None visualized. Other Findings: No evidence of superficial thrombophlebitis or abnormal fluid collection. IMPRESSION: No evidence of upper extra deep venous thrombosis. Electronically Signed   By: Aletta Edouard M.D.   On: 06/13/2016  10:06   Dg Chest Port 1 View  Result Date: 06/25/2016 CLINICAL DATA:  Hypoxia EXAM: PORTABLE CHEST 1 VIEW COMPARISON:  June 24, 2016 FINDINGS: Endotracheal tube tip is 5.4 cm above the carina. Nasogastric tube tip  and side port below the diaphragm. Central catheter tip is in the superior vena cava. No pneumothorax. There is a small left pleural effusion. There is mild bibasilar atelectatic change. There is no frank edema or consolidation. Heart size and pulmonary vascularity are normal. There is postoperative change in the lower thoracic and upper lumbar regions. IMPRESSION: Tube and catheter positions as described without pneumothorax. Small left pleural effusion with bibasilar atelectasis. No edema or consolidation. Stable cardiac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

## 2016-06-25 NOTE — Care Management Important Message (Signed)
Important Message  Patient Details  Name: YIA HAYMOND MRN: [id number] Date of Birth: 1950-08-11   Medicare Important Message Given:  Yes    Teri Legacy, [place]y [place], RN 06/25/2016, 2:11 PM

## 2016-06-25 NOTE — Progress Notes (Addendum)
Pt transferred to [facility] by [facility], medications hanging at time of transfer - Versed, Fentanyl, and Normal Saline. All pertinent patient information given to [facility] staff and [facility] Rn.    Witness to  Transfer of medications Loni Muse, RN

## 2016-06-25 NOTE — Clinical Social Work Note (Signed)
CSW notified pt's sister, [name] that pt will be transferring to [facility] as facility has worked out IVIG. She is very relieved. No other needs reported.  Benay Pike, [place]

## 2016-06-25 NOTE — Progress Notes (Signed)
[facility] Physician-Brief Progress Note Patient Name: Marc Rose DOB: 09-Sep-1950 MRN: [id number]   Date of Service  06/25/2016  HPI/Events of Note  Hypotension - BP = 50/33. Sedatives IV infusions now held. LVEF 60-65%.  eICU Interventions  Will bolus with 0.9 NaCl 1 liter IV over 1 hour now.      Intervention Category Major Interventions: Hypotension - evaluation and management  Sommer,Steven Cornelia Copa 06/25/2016, 5:17 PM

## 2016-06-25 NOTE — Care Management (Signed)
Patient to transfer to [facility] this afternoon. Room 415. Accepting is Dr. Elijio Miles.

## 2016-06-25 NOTE — Progress Notes (Signed)
Pt hypotensive, [facility] RN aware and pt given NS bolus per MD, sedation paused, MD Memon notified. B/P now 146/99, report given to [place] at [facility] [place]. [facility] on unit to transport patient.

## 2016-06-25 NOTE — Progress Notes (Signed)
Daily Progress Note   Patient Name: Marc Rose       Date: 06/25/2016 DOB: [birth year]  Age: 66 y.o. MRN#: [id number] Attending Physician: [name], MD Primary Care Physician: [name] Patient Admit Date: 06/03/2016  Reason for Consultation/Follow-up: Disposition, Establishing goals of care and Psychosocial/spiritual support  Subjective: Marc Rose is lying quietly in bed. He is intubated/ventilated and sedated. There is no family at bedside at this time. Call to sister Marc Rose. We talk about Mr. Marc Rose expected transfer to [facility] today. I encouraged Marc Rose to reach out to [facility] with [facility] with problems or questions. We talk about durable power of attorney.  I share with Marc Rose that community social worker had no information about an easier way for her to obtain power of attorney/guardianship for Marc Rose. We talk about getting's medical records from this inpatient stay (she may have to complete forms that say he is in unable), take those forms to [facility], ask to speak to the magistrate. I share with Marc Rose that it is okay for her to give my card/contact information to the magistrate and have them call me if necessary. Marc Rose continues to talk about plans for managing Marc Rose affairs while he is incapacitated for likely the next 4 to 6 months. Support offered. Marc Rose states that she and her husband Marc Rose plan to return to [place] for a few days on Monday 2/26.  Length of Stay: 22  Current Medications: Scheduled Meds:  . chlorhexidine gluconate (MEDLINE KIT)  15 mL Mouth Rinse BID  . Chlorhexidine Gluconate Cloth  6 each Topical Daily  . clindamycin (CLEOCIN) IV  600 mg Intravenous Q8H  . doxycycline (VIBRAMYCIN) IV  100 mg Intravenous  Q12H  . enoxaparin (LOVENOX) injection  40 mg Subcutaneous Q24H  . IMMUNE GLOBULIN 10% (HUMAN) IV - For Fluid Restriction Only  400 mg/kg Intravenous Q24H  . mouth rinse  15 mL Mouth Rinse BID  . pantoprazole (PROTONIX) IV  40 mg Intravenous Q24H  . polyvinyl alcohol  1 drop Both Eyes TID  . senna-docusate  1 tablet Oral QHS  . sodium chloride flush  10-40 mL Intracatheter Q12H  . tamsulosin  0.4 mg Oral QPC supper  . thiamine  100 mg Oral Daily    Continuous Infusions: . 0.9 % NaCl  with KCl 40 mEq / L 125 mL/hr (06/25/16 0738)  . fentaNYL infusion INTRAVENOUS 150 mcg/hr (06/25/16 1100)  . midazolam (VERSED) infusion 2 mg/hr (06/25/16 1150)    PRN Meds: acetaminophen **OR** acetaminophen, bisacodyl, fentaNYL, fentaNYL (SUBLIMAZE) injection, haloperidol lactate, ondansetron **OR** ondansetron (ZOFRAN) IV, sodium chloride flush  Physical Exam  Constitutional: No distress.  Intubated/ventilated, chronically ill appearing  HENT:  Head: Normocephalic and atraumatic.  Cardiovascular: Regular rhythm.   Rate 110's-130's  Pulmonary/Chest:  Intubated/ventilated  Abdominal: Soft. He exhibits no distension.  Musculoskeletal: He exhibits no edema.  Neurological:  Intubated/sedated  Skin: Skin is warm and dry.  Multiple areas of old bruising  Nursing note and vitals reviewed.           Vital Signs: BP (!) 147/84 (BP Location: Left Arm)   Pulse (!) 123   Temp 99.2 F (37.3 C) (Axillary)   Resp (!) 22   Ht '6\' 2"'$  (1.88 m)   Wt 67.9 kg (149 lb 11.1 oz)   SpO2 100%   BMI 19.22 kg/m  SpO2: SpO2: 100 % O2 Device: O2 Device: Ventilator O2 Flow Rate: O2 Flow Rate (L/min): 15 L/min  Intake/output summary:  Intake/Output Summary (Last 24 hours) at 06/25/16 1206 Last data filed at 06/25/16 0919  Gross per 24 hour  Intake          3140.74 ml  Output             3775 ml  Net          -634.26 ml   LBM: Last BM Date: 06/20/16 Baseline Weight: Weight: 74.8 kg (165 lb) Most recent  weight: Weight: 67.9 kg (149 lb 11.1 oz)       Palliative Assessment/Data:    Flowsheet Rows   Flowsheet Row Most Recent Value  Intake Tab  Referral Department  Hospitalist  Unit at Time of Referral  ICU  Palliative Care Primary Diagnosis  Neurology  Date Notified  06/17/16  Palliative Care Type  New Palliative care  Reason for referral  Clarify Goals of Care  Date of Admission  06/03/16  Date first seen by Palliative Care  06/17/16  # of days Palliative referral response time  0 Day(s)  # of days IP prior to Palliative referral  14  Clinical Assessment  Palliative Performance Scale Score  20%  Pain Max last 24 hours  Not able to report  Pain Min Last 24 hours  Not able to report  Dyspnea Max Last 24 Hours  Not able to report  Dyspnea Min Last 24 hours  Not able to report  Psychosocial & Spiritual Assessment  Palliative Care Outcomes  Patient/Family meeting held?  Yes  Who was at the meeting?  With sister, Marc Rose, at bedside  [place]  Provided psychosocial or spiritual support  Palliative Care follow-up planned  -- [Follow-up while at [place]      Patient Active Problem List   Diagnosis Date Noted  . DNR (do not resuscitate) discussion   . Urinary tract infection without hematuria   . Pressure injury of skin 06/21/2016  . Palliative care encounter   . Goals of care, counseling/discussion   . 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 Malnutrition of moderate degree 06/10/2016  . Ataxic gait   . Muscle weakness (generalized)   . 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 Drug rash 11/12/2015  . Hyperpigmentation 05/15/2015  . Abdominal pain, right lower quadrant 02/05/2015  .  Abscess, psoas ([place]) 02/05/2015  . Acute upper respiratory infection 02/05/2015  . Long term current use of antibiotics 02/05/2015  . Benign essential HTN 02/05/2015  . Edema leg 02/05/2015  . 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 Back pain, chronic 02/05/2015  . CN (constipation) 02/05/2015  . CD (contact dermatitis) 02/05/2015  . Elevated fasting blood sugar 02/05/2015  . LBP (low back pain) 02/05/2015  . Discitis of lumbar region 02/05/2015  . Disorder of nutrition 02/05/2015  . Feeling bilious 02/05/2015  . Flu vaccine need 02/05/2015  . Peripheral neuropathic pain 02/05/2015  . Non compliance with medical treatment 02/05/2015  . Abnormal blood chemistry 02/05/2015  . Angiopathy, peripheral ([place]) 02/05/2015  . Nerve disease, peripheral ([place]) 02/05/2015  . Hypercholesterolemia without hypertriglyceridemia 02/05/2015  . Spinal stenosis 02/05/2015  . Buzzing in ear 02/05/2015  . Compulsive tobacco user syndrome 02/05/2015  . Bladder retention 02/05/2015  . Abnormal weight loss 02/05/2015  . Diskitis 02/05/2015  . Hardware complicating wound infection ([place]) 02/05/2015  . Hyperlipidemia 07/05/2014  . Peripheral arterial disease ([place]) 07/05/2014  . Hypotension 09/16/2013  . HTN (hypertension) 09/16/2013  . Anxiety 09/16/2013  . Back pain 09/16/2013    Palliative Care Assessment & Plan   Patient Profile: 66 y.o.malewith past medical history of arthritis, benign neoplasm of colon, chronic back and leg pain, diabetes, depression, hypertension, PAD, tobacco use disorderadmitted on 2/1/2018with UTI, Miller-Fisher variant Guillain-Barre syndrome. Intubated upon admission, extubated 2/16, reintubated 2/21.  Assessment: Miller-Fisher variant Guillain-Barre syndrome:Long recovery anticipated. Completed 5 day course of IV IG. PT and OT. Usual course worsens for 4 weeks (likely at nadir of deterioration now)and then gradually improves over 3-4 months. Difficulty breathing, question if paralysis has ascended to the diaphragm, reintubated 2/21.  Plan for transfer to [place], trach/PEG Encephalopathy;unknown source, may simply be at the nadir of his clinical  course, aspiration.  Recommendations/Plan: Plan for transfer to [facility] today, for trach/PEG  Goals of Care and Additional Recommendations:  Limitations on Scope of Treatment: Full Scope Treatment Marc Rose and I discuss code status 2/22, I encourage sister to consider future choices.  Code Status:    Code Status Orders        Start     Ordered   06/03/16 1518  Full code  Continuous     06/03/16 1517    Code Status History    Date Active Date Inactive Code Status Order ID Comments User Context   09/16/2013 10:03 PM 09/19/2013  4:23 PM Full Code [id number]  Doree Albee, MD ED    Advance Directive Documentation   [place] Most Recent Value  Type of Advance Directive  Living will  Pre-existing out of facility DNR order (yellow form or pink MOST form)  No data  "MOST" Form in Place?  No data       Prognosis:   Unable to determine, based on outcomes  Discharge Planning:  Family is requesting [facility], LT[facility] for trach/PEG rehab.   Care plan was discussed with nursing staff, case manager, social worker, and Dr. Roderic Palau.   Thank you for allowing the Palliative Medicine Team to assist in the care of this patient.   Time In: 0930 Time Out: 1000 Total Time 25 minutes Prolonged Time Billed  no       Greater than 50%  of this time was spent counseling and coordinating care related to the above assessment and plan.  Drue Novel, NP  Please contact  Palliative Medicine Team phone at [phone] for questions and concerns.

## 2016-06-25 NOTE — Progress Notes (Signed)
Subjective: He remains intubated and on mechanical ventilation. He was initially intubated remained on the ventilator for about a week was able to be extubated then developed increasing air hunger and had to be reintubated. Considering his neurological status I think she's going to need tracheostomy. That should make it much safer for him in the future. He should also need PEG tube I believe. Plans are for him to be transferred to [facility]. Chest x-ray on the 21st looked like he might have had pneumonia probably aspiration and he's been treated for that but his chest x-ray today which I have personally reviewed is less striking.  Objective: Vital signs in last 24 hours: Temp:  [97.8 F (36.6 C)-98.7 F (37.1 C)] 98.7 F (37.1 C) (02/23 0400) Pulse Rate:  [80-126] 117 (02/23 0600) Resp:  [12-25] 14 (02/23 0600) BP: (90-164)/(56-98) 144/83 (02/23 0600) SpO2:  [86 %-100 %] 100 % (02/23 0600) FiO2 (%):  [40 %] 40 % (02/23 0600) Weight:  [67.9 kg (149 lb 11.1 oz)] 67.9 kg (149 lb 11.1 oz) (02/23 0500) Weight change: 0.3 kg (10.6 oz) Last BM Date: 06/20/16  Intake/Output from previous day: 02/22 0701 - 02/23 0700 In: 4236.8 [I.V.:3486.8; IV Piggyback:750] Out: 2925 [Urine:2500; Emesis/NG output:425]  PHYSICAL EXAM General appearance: He is intubated sedated on mechanical ventilation Resp: rhonchi bilaterally Cardio: regular rate and rhythm, S1, S2 normal, no murmur, click, rub or gallop GI: soft, non-tender; bowel sounds normal; no masses,  no organomegaly Extremities: He has skin lesions from his ataxia prior to admission. He still seems ataxic when he moves. Skin warm and dry. If his membranes are moist  Lab Results:  Results for orders placed or performed during the hospital encounter of 06/03/16 (from the past 48 hour(s))  Glucose, capillary     Status: Abnormal   Collection Time: 06/23/16 11:15 AM  Result Value Ref Range   Glucose-Capillary 103 (H) 65 - 99 mg/dL  Glucose, capillary      Status: None   Collection Time: 06/23/16  4:07 PM  Result Value Ref Range   Glucose-Capillary 90 65 - 99 mg/dL  Glucose, capillary     Status: None   Collection Time: 06/23/16  7:50 PM  Result Value Ref Range   Glucose-Capillary 85 65 - 99 mg/dL   Comment 1 Notify RN   Glucose, capillary     Status: Abnormal   Collection Time: 06/24/16 12:00 AM  Result Value Ref Range   Glucose-Capillary 115 (H) 65 - 99 mg/dL   Comment 1 Notify RN   Glucose, capillary     Status: None   Collection Time: 06/24/16  4:00 AM  Result Value Ref Range   Glucose-Capillary 86 65 - 99 mg/dL   Comment 1 Notify RN   CBC     Status: Abnormal   Collection Time: 06/24/16  4:07 AM  Result Value Ref Range   WBC 7.1 4.0 - 10.5 K/uL   RBC 2.77 (L) 4.22 - 5.81 MIL/uL   Hemoglobin 9.1 (L) 13.0 - 17.0 g/dL   HCT 26.9 (L) 39.0 - 52.0 %   MCV 97.1 78.0 - 100.0 fL   MCH 32.9 26.0 - 34.0 pg   MCHC 33.8 30.0 - 36.0 g/dL   RDW 15.7 (H) 11.5 - 15.5 %   Platelets 428 (H) 150 - 400 K/uL  Basic metabolic panel     Status: Abnormal   Collection Time: 06/24/16  4:07 AM  Result Value Ref Range   Sodium 142 135 - 145  mmol/L   Potassium 3.8 3.5 - 5.1 mmol/L   Chloride 110 101 - 111 mmol/L   CO2 27 22 - 32 mmol/L   Glucose, Bld 92 65 - 99 mg/dL   BUN 14 6 - 20 mg/dL   Creatinine, Ser 0.71 0.61 - 1.24 mg/dL   Calcium 8.3 (L) 8.9 - 10.3 mg/dL   GFR calc non Af Amer >60 >60 mL/min   GFR calc Af Amer >60 >60 mL/min    Comment: (NOTE) The eGFR has been calculated using the CKD EPI equation. This calculation has not been validated in all clinical situations. eGFR's persistently <60 mL/min signify possible Chronic Kidney Disease.    Anion gap 5 5 - 15  Glucose, capillary     Status: None   Collection Time: 06/24/16  7:33 AM  Result Value Ref Range   Glucose-Capillary 91 65 - 99 mg/dL  Blood gas, arterial     Status: Abnormal   Collection Time: 06/24/16  8:55 AM  Result Value Ref Range   FIO2 0.40    O2 Content 40.0  L/min   Delivery systems VENTILATOR    Mode PRESSURE REGULATED VOLUME CONTROL    VT 550 mL   LHR 14 resp/min   Peep/cpap 5.0 cm H20   pH, Arterial 7.399 7.350 - 7.450   pCO2 arterial 39.6 32.0 - 48.0 mmHg   pO2, Arterial 81.9 (L) 83.0 - 108.0 mmHg   Bicarbonate 24.2 20.0 - 28.0 mmol/L   Acid-base deficit 0.2 0.0 - 2.0 mmol/L   O2 Saturation 95.3 %   Collection site LEFT RADIAL    Drawn by [phone]    Sample type ARTERIAL    Allens test (pass/fail) PASS PASS  Glucose, capillary     Status: Abnormal   Collection Time: 06/24/16 11:34 AM  Result Value Ref Range   Glucose-Capillary 117 (H) 65 - 99 mg/dL  Glucose, capillary     Status: None   Collection Time: 06/24/16  4:09 PM  Result Value Ref Range   Glucose-Capillary 90 65 - 99 mg/dL  Glucose, capillary     Status: None   Collection Time: 06/24/16  7:18 PM  Result Value Ref Range   Glucose-Capillary 99 65 - 99 mg/dL   Comment 1 Notify RN   Glucose, capillary     Status: Abnormal   Collection Time: 06/25/16 12:04 AM  Result Value Ref Range   Glucose-Capillary 101 (H) 65 - 99 mg/dL   Comment 1 Notify RN   Glucose, capillary     Status: None   Collection Time: 06/25/16  3:56 AM  Result Value Ref Range   Glucose-Capillary 76 65 - 99 mg/dL   Comment 1 Notify RN   CBC     Status: Abnormal   Collection Time: 06/25/16  5:23 AM  Result Value Ref Range   WBC 8.0 4.0 - 10.5 K/uL   RBC 3.20 (L) 4.22 - 5.81 MIL/uL   Hemoglobin 10.5 (L) 13.0 - 17.0 g/dL   HCT 30.8 (L) 39.0 - 52.0 %   MCV 96.3 78.0 - 100.0 fL   MCH 32.8 26.0 - 34.0 pg   MCHC 34.1 30.0 - 36.0 g/dL   RDW 15.8 (H) 11.5 - 15.5 %   Platelets 431 (H) 150 - 400 K/uL  Basic metabolic panel     Status: Abnormal   Collection Time: 06/25/16  5:23 AM  Result Value Ref Range   Sodium 135 135 - 145 mmol/L    Comment: DELTA CHECK NOTED  Potassium 4.2 3.5 - 5.1 mmol/L   Chloride 104 101 - 111 mmol/L   CO2 26 22 - 32 mmol/L   Glucose, Bld 103 (H) 65 - 99 mg/dL   BUN 9 6 - 20  mg/dL   Creatinine, Ser 0.53 (L) 0.61 - 1.24 mg/dL   Calcium 8.4 (L) 8.9 - 10.3 mg/dL   GFR calc non Af Amer >60 >60 mL/min   GFR calc Af Amer >60 >60 mL/min    Comment: (NOTE) The eGFR has been calculated using the CKD EPI equation. This calculation has not been validated in all clinical situations. eGFR's persistently <60 mL/min signify possible Chronic Kidney Disease.    Anion gap 5 5 - 15  Blood gas, arterial     Status: Abnormal   Collection Time: 06/25/16  5:53 AM  Result Value Ref Range   FIO2 40.00    Delivery systems VENTILATOR    Mode PRESSURE REGULATED VOLUME CONTROL    VT 550 mL   LHR 14 resp/min   Peep/cpap 5.0 cm H20   pH, Arterial 7.370 7.350 - 7.450   pCO2 arterial 45.4 32.0 - 48.0 mmHg   pO2, Arterial 97.2 83.0 - 108.0 mmHg   Bicarbonate 24.8 20.0 - 28.0 mmol/L   Acid-Base Excess 0.9 0.0 - 2.0 mmol/L   O2 Saturation 96.7 %   Patient temperature 37.0    Collection site LEFT RADIAL    Drawn by [id number]    Sample type ARTERIAL DRAW    Allens test (pass/fail) POSITIVE (A) PASS    ABGS  Recent Labs  06/25/16 0553  PHART 7.370  PO2ART 97.2  HCO3 24.8   CULTURES No results found for this or any previous visit (from the past 240 hour(s)). Studies/Results: Dg Chest Port 1 View  Result Date: 06/25/2016 CLINICAL DATA:  Hypoxia EXAM: PORTABLE CHEST 1 VIEW COMPARISON:  June 24, 2016 FINDINGS: Endotracheal tube tip is 5.4 cm above the carina. Nasogastric tube tip and side port below the diaphragm. Central catheter tip is in the superior vena cava. No pneumothorax. There is a small left pleural effusion. There is mild bibasilar atelectatic change. There is no frank edema or consolidation. Heart size and pulmonary vascularity are normal. There is postoperative change in the lower thoracic and upper lumbar regions. IMPRESSION: Tube and catheter positions as described without pneumothorax. Small left pleural effusion with bibasilar atelectasis. No edema or  consolidation. Stable cardiac silhouette. Electronically Signed   By: Lowella Grip III M.D.   On: 06/25/2016 07:06   Dg Chest Port 1 View  Result Date: 06/24/2016 CLINICAL DATA:  Respiratory failure, ventilator patient, variant of Guillain-Barre, former smoker. EXAM: PORTABLE CHEST 1 VIEW COMPARISON:  Portable chest x-ray of June 23, 2016 FINDINGS: The right lung is well-expanded. On the left there is increased retrocardiac density with small left pleural effusion. The heart is normal in size. The pulmonary vascularity is not engorged. The endotracheal tube tip measures 4.7 cm above the carina. The esophagogastric tubes proximal port is at the level of the GE junction. The left PICC line tip projects over the distal SVC. IMPRESSION: Left lower lobe atelectasis and small left pleural effusion not greatly changed from yesterday's study. No CHF. Underlying COPD. The endotracheal tube and PICC line are in reasonable position. Advancement of the esophagogastric tube by 5-10 cm would assure that the proximal port remains below the GE junction. Electronically Signed   By: David  Martinique M.D.   On: 06/24/2016 07:36    Medications:  Prior to Admission:  Prescriptions Prior to Admission  Medication Sig Dispense Refill Last Dose  . aspirin EC 81 MG tablet Take 81 mg by mouth daily.   Past Week at Unknown time  . benazepril (LOTENSIN) 40 MG tablet Take 40 mg by mouth daily.   Past Week at Unknown time  . cyclobenzaprine (FLEXERIL) 10 MG tablet Take 1 tablet (10 mg total) by mouth 3 (three) times daily as needed for muscle spasms. 30 tablet 0 Past Week at Unknown time  . diazepam (VALIUM) 10 MG tablet Take 0.5 tablets (5 mg total) by mouth every 6 (six) hours as needed for anxiety (back spasms). (Patient taking differently: Take 10 mg by mouth 4 (four) times daily as needed for anxiety (back spasms). ) 30 tablet 0 Past Week at Unknown time  . doxycycline (VIBRA-TABS) 100 MG tablet Take 1 tablet (100 mg  total) by mouth 2 (two) times daily. 60 tablet 11 Past Week at Unknown time  . fluticasone (FLONASE) 50 MCG/ACT nasal spray Place 1 spray into the nose daily as needed for allergies.    unknown  . NEOMYCIN-POLYMYXIN-HYDROCORTISONE (CORTISPORIN) 1 % SOLN otic solution Place 2 drops in ear(s) daily. Reported on 11/12/2015   Past Week at Unknown time  . pregabalin (LYRICA) 75 MG capsule Take 75 mg by mouth 2 (two) times daily.    Past Week at Unknown time  . tamsulosin (FLOMAX) 0.4 MG CAPS capsule Take 0.4 mg by mouth.   Past Week at Unknown time   Scheduled: . chlorhexidine gluconate (MEDLINE KIT)  15 mL Mouth Rinse BID  . Chlorhexidine Gluconate Cloth  6 each Topical Daily  . clindamycin (CLEOCIN) IV  600 mg Intravenous Q8H  . doxycycline (VIBRAMYCIN) IV  100 mg Intravenous Q12H  . enoxaparin (LOVENOX) injection  40 mg Subcutaneous Q24H  . IMMUNE GLOBULIN 10% (HUMAN) IV - For Fluid Restriction Only  400 mg/kg Intravenous Q24H  . mouth rinse  15 mL Mouth Rinse BID  . pantoprazole (PROTONIX) IV  40 mg Intravenous Q24H  . polyvinyl alcohol  1 drop Both Eyes TID  . senna-docusate  1 tablet Oral QHS  . sodium chloride flush  10-40 mL Intracatheter Q12H  . tamsulosin  0.4 mg Oral QPC supper  . thiamine  100 mg Oral Daily   Continuous: . 0.9 % NaCl with KCl 40 mEq / L 125 mL/hr (06/25/16 0738)  . fentaNYL infusion INTRAVENOUS 250 mcg/hr (06/25/16 0600)  . midazolam (VERSED) infusion 3 mg/hr (06/25/16 0600)   XHB:[id number] **OR** acetaminophen, bisacodyl, fentaNYL, fentaNYL (SUBLIMAZE) injection, haloperidol lactate, ondansetron **OR** ondansetron (ZOFRAN) IV, sodium chloride flush  Assesment: He was admitted with [name] variant of Guillain-Barr syndrome. He has respiratory failure which seems to be a result of respiratory muscle compromise from that. Chest x-ray with a second-degree intubation looked like he had an infiltrate likely aspiration pneumonia and he certainly is high risk  for that and I would continue antibiotics for now. He's going to need tracheostomy and PEG tube. He will obviously have a prolonged recovery Principal Problem:   Miller-Fisher variant Guillain-Barre syndrome (HCC) Active Problems:   HTN (hypertension)   Long term current use of antibiotics   Back pain, chronic   Discitis of lumbar region   Nerve disease, peripheral (HCC)   Neurogenic urinary bladder disorder   Ataxia   Muscle weakness (generalized)   Ataxic gait   Malnutrition of moderate degree   Palliative care encounter   Goals of care, counseling/discussion  Pressure injury of skin   Urinary tract infection without hematuria   DNR (do not resuscitate) discussion    Plan: He will need LTAC placement. He will need tracheostomy and PEG tube.    LOS: 22 days   , L 06/25/2016, 7:46 AM

## 2016-06-25 NOTE — Clinical Social Work Note (Addendum)
Pt accepted at [facility] today. CSW notified pt's sister, [name] who states they will be traveling to see pt in the next few days. Auth received from [facility]: [id number].   Update: [facility] unable to give IVIG. Pt will not transfer until next week when this is complete. CSW updated [place] with information.   Benay Pike, [place]

## 2016-06-26 DIAGNOSIS — I10 Essential (primary) hypertension: Secondary | ICD-10-CM | POA: Diagnosis not present

## 2016-06-26 DIAGNOSIS — I5033 Acute on chronic diastolic (congestive) heart failure: Secondary | ICD-10-CM | POA: Diagnosis not present

## 2016-06-26 DIAGNOSIS — G610 Guillain-Barre syndrome: Secondary | ICD-10-CM | POA: Diagnosis not present

## 2016-06-26 DIAGNOSIS — J9620 Acute and chronic respiratory failure, unspecified whether with hypoxia or hypercapnia: Secondary | ICD-10-CM | POA: Diagnosis not present

## 2016-06-26 DIAGNOSIS — E43 Unspecified severe protein-calorie malnutrition: Secondary | ICD-10-CM | POA: Diagnosis not present

## 2016-06-27 DIAGNOSIS — E43 Unspecified severe protein-calorie malnutrition: Secondary | ICD-10-CM | POA: Diagnosis not present

## 2016-06-27 DIAGNOSIS — J9690 Respiratory failure, unspecified, unspecified whether with hypoxia or hypercapnia: Secondary | ICD-10-CM | POA: Diagnosis not present

## 2016-06-27 DIAGNOSIS — G610 Guillain-Barre syndrome: Secondary | ICD-10-CM | POA: Diagnosis not present

## 2016-06-27 DIAGNOSIS — R1310 Dysphagia, unspecified: Secondary | ICD-10-CM | POA: Diagnosis not present

## 2016-06-27 DIAGNOSIS — E049 Nontoxic goiter, unspecified: Secondary | ICD-10-CM | POA: Diagnosis not present

## 2016-06-27 DIAGNOSIS — I5033 Acute on chronic diastolic (congestive) heart failure: Secondary | ICD-10-CM | POA: Diagnosis not present

## 2016-06-27 DIAGNOSIS — E46 Unspecified protein-calorie malnutrition: Secondary | ICD-10-CM | POA: Diagnosis not present

## 2016-06-27 DIAGNOSIS — I10 Essential (primary) hypertension: Secondary | ICD-10-CM | POA: Diagnosis not present

## 2016-06-28 DIAGNOSIS — R5381 Other malaise: Secondary | ICD-10-CM | POA: Diagnosis not present

## 2016-06-28 DIAGNOSIS — J9621 Acute and chronic respiratory failure with hypoxia: Secondary | ICD-10-CM | POA: Diagnosis not present

## 2016-06-28 DIAGNOSIS — R4189 Other symptoms and signs involving cognitive functions and awareness: Secondary | ICD-10-CM | POA: Diagnosis not present

## 2016-06-28 DIAGNOSIS — F05 Delirium due to known physiological condition: Secondary | ICD-10-CM | POA: Diagnosis not present

## 2016-06-28 DIAGNOSIS — M6281 Muscle weakness (generalized): Secondary | ICD-10-CM | POA: Diagnosis not present

## 2016-06-28 DIAGNOSIS — Z9911 Dependence on respirator [ventilator] status: Secondary | ICD-10-CM | POA: Diagnosis not present

## 2016-06-28 DIAGNOSIS — G610 Guillain-Barre syndrome: Secondary | ICD-10-CM | POA: Diagnosis not present

## 2016-06-28 DIAGNOSIS — J9690 Respiratory failure, unspecified, unspecified whether with hypoxia or hypercapnia: Secondary | ICD-10-CM | POA: Diagnosis not present

## 2016-06-28 DIAGNOSIS — F331 Major depressive disorder, recurrent, moderate: Secondary | ICD-10-CM | POA: Diagnosis not present

## 2016-06-28 DIAGNOSIS — F419 Anxiety disorder, unspecified: Secondary | ICD-10-CM | POA: Diagnosis not present

## 2016-06-28 DIAGNOSIS — A419 Sepsis, unspecified organism: Secondary | ICD-10-CM | POA: Diagnosis not present

## 2016-06-29 DIAGNOSIS — G610 Guillain-Barre syndrome: Secondary | ICD-10-CM | POA: Diagnosis not present

## 2016-06-29 DIAGNOSIS — J9621 Acute and chronic respiratory failure with hypoxia: Secondary | ICD-10-CM | POA: Diagnosis not present

## 2016-06-29 DIAGNOSIS — E46 Unspecified protein-calorie malnutrition: Secondary | ICD-10-CM | POA: Diagnosis not present

## 2016-06-29 DIAGNOSIS — A419 Sepsis, unspecified organism: Secondary | ICD-10-CM | POA: Diagnosis not present

## 2016-06-29 DIAGNOSIS — Z9911 Dependence on respirator [ventilator] status: Secondary | ICD-10-CM | POA: Diagnosis not present

## 2016-06-29 DIAGNOSIS — R1310 Dysphagia, unspecified: Secondary | ICD-10-CM | POA: Diagnosis not present

## 2016-06-29 DIAGNOSIS — J9690 Respiratory failure, unspecified, unspecified whether with hypoxia or hypercapnia: Secondary | ICD-10-CM | POA: Diagnosis not present

## 2016-06-29 DIAGNOSIS — E049 Nontoxic goiter, unspecified: Secondary | ICD-10-CM | POA: Diagnosis not present

## 2016-06-29 NOTE — Progress Notes (Signed)
OG inserted, X-ray viewed by ER MD that completed intubation as well as Dr.Sommer from [facility]. No orders to hook up to suction at this time.  Yanai Hobson Rica Mote, RN

## 2016-06-30 DIAGNOSIS — J9690 Respiratory failure, unspecified, unspecified whether with hypoxia or hypercapnia: Secondary | ICD-10-CM | POA: Diagnosis not present

## 2016-06-30 DIAGNOSIS — R4189 Other symptoms and signs involving cognitive functions and awareness: Secondary | ICD-10-CM | POA: Diagnosis not present

## 2016-06-30 DIAGNOSIS — G610 Guillain-Barre syndrome: Secondary | ICD-10-CM | POA: Diagnosis not present

## 2016-06-30 DIAGNOSIS — Z9911 Dependence on respirator [ventilator] status: Secondary | ICD-10-CM | POA: Diagnosis not present

## 2016-06-30 DIAGNOSIS — J9621 Acute and chronic respiratory failure with hypoxia: Secondary | ICD-10-CM | POA: Diagnosis not present

## 2016-06-30 DIAGNOSIS — M6281 Muscle weakness (generalized): Secondary | ICD-10-CM | POA: Diagnosis not present

## 2016-06-30 DIAGNOSIS — R5381 Other malaise: Secondary | ICD-10-CM | POA: Diagnosis not present

## 2016-06-30 DIAGNOSIS — A419 Sepsis, unspecified organism: Secondary | ICD-10-CM | POA: Diagnosis not present

## 2016-07-01 DIAGNOSIS — Z9911 Dependence on respirator [ventilator] status: Secondary | ICD-10-CM | POA: Diagnosis not present

## 2016-07-01 DIAGNOSIS — J9690 Respiratory failure, unspecified, unspecified whether with hypoxia or hypercapnia: Secondary | ICD-10-CM | POA: Diagnosis not present

## 2016-07-01 DIAGNOSIS — J9621 Acute and chronic respiratory failure with hypoxia: Secondary | ICD-10-CM | POA: Diagnosis not present

## 2016-07-01 DIAGNOSIS — A419 Sepsis, unspecified organism: Secondary | ICD-10-CM | POA: Diagnosis not present

## 2016-07-01 DIAGNOSIS — F419 Anxiety disorder, unspecified: Secondary | ICD-10-CM | POA: Diagnosis not present

## 2016-07-01 DIAGNOSIS — G610 Guillain-Barre syndrome: Secondary | ICD-10-CM | POA: Diagnosis not present

## 2016-07-01 DIAGNOSIS — F05 Delirium due to known physiological condition: Secondary | ICD-10-CM | POA: Diagnosis not present

## 2016-07-01 DIAGNOSIS — F331 Major depressive disorder, recurrent, moderate: Secondary | ICD-10-CM | POA: Diagnosis not present

## 2016-07-02 DIAGNOSIS — A419 Sepsis, unspecified organism: Secondary | ICD-10-CM | POA: Diagnosis not present

## 2016-07-02 DIAGNOSIS — Z9911 Dependence on respirator [ventilator] status: Secondary | ICD-10-CM | POA: Diagnosis not present

## 2016-07-02 DIAGNOSIS — J9621 Acute and chronic respiratory failure with hypoxia: Secondary | ICD-10-CM | POA: Diagnosis not present

## 2016-07-02 DIAGNOSIS — J9690 Respiratory failure, unspecified, unspecified whether with hypoxia or hypercapnia: Secondary | ICD-10-CM | POA: Diagnosis not present

## 2016-07-02 DIAGNOSIS — G610 Guillain-Barre syndrome: Secondary | ICD-10-CM | POA: Diagnosis not present

## 2016-07-03 DIAGNOSIS — A419 Sepsis, unspecified organism: Secondary | ICD-10-CM | POA: Diagnosis not present

## 2016-07-03 DIAGNOSIS — G610 Guillain-Barre syndrome: Secondary | ICD-10-CM | POA: Diagnosis not present

## 2016-07-03 DIAGNOSIS — G9340 Encephalopathy, unspecified: Secondary | ICD-10-CM | POA: Diagnosis not present

## 2016-07-03 DIAGNOSIS — J189 Pneumonia, unspecified organism: Secondary | ICD-10-CM | POA: Diagnosis not present

## 2016-07-03 DIAGNOSIS — J9621 Acute and chronic respiratory failure with hypoxia: Secondary | ICD-10-CM | POA: Diagnosis not present

## 2016-07-03 DIAGNOSIS — Z9911 Dependence on respirator [ventilator] status: Secondary | ICD-10-CM | POA: Diagnosis not present

## 2016-07-04 DIAGNOSIS — J9621 Acute and chronic respiratory failure with hypoxia: Secondary | ICD-10-CM | POA: Diagnosis not present

## 2016-07-04 DIAGNOSIS — G610 Guillain-Barre syndrome: Secondary | ICD-10-CM | POA: Diagnosis not present

## 2016-07-04 DIAGNOSIS — A419 Sepsis, unspecified organism: Secondary | ICD-10-CM | POA: Diagnosis not present

## 2016-07-04 DIAGNOSIS — Z9911 Dependence on respirator [ventilator] status: Secondary | ICD-10-CM | POA: Diagnosis not present

## 2016-07-04 DIAGNOSIS — G9340 Encephalopathy, unspecified: Secondary | ICD-10-CM | POA: Diagnosis not present

## 2016-07-04 DIAGNOSIS — J189 Pneumonia, unspecified organism: Secondary | ICD-10-CM | POA: Diagnosis not present

## 2016-07-05 DIAGNOSIS — F419 Anxiety disorder, unspecified: Secondary | ICD-10-CM | POA: Diagnosis not present

## 2016-07-05 DIAGNOSIS — F331 Major depressive disorder, recurrent, moderate: Secondary | ICD-10-CM | POA: Diagnosis not present

## 2016-07-05 DIAGNOSIS — J9690 Respiratory failure, unspecified, unspecified whether with hypoxia or hypercapnia: Secondary | ICD-10-CM | POA: Diagnosis not present

## 2016-07-05 DIAGNOSIS — G610 Guillain-Barre syndrome: Secondary | ICD-10-CM | POA: Diagnosis not present

## 2016-07-05 DIAGNOSIS — F05 Delirium due to known physiological condition: Secondary | ICD-10-CM | POA: Diagnosis not present

## 2016-07-05 DIAGNOSIS — J9620 Acute and chronic respiratory failure, unspecified whether with hypoxia or hypercapnia: Secondary | ICD-10-CM | POA: Diagnosis not present

## 2016-07-06 DIAGNOSIS — J9620 Acute and chronic respiratory failure, unspecified whether with hypoxia or hypercapnia: Secondary | ICD-10-CM | POA: Diagnosis not present

## 2016-07-06 DIAGNOSIS — G610 Guillain-Barre syndrome: Secondary | ICD-10-CM | POA: Diagnosis not present

## 2016-07-06 DIAGNOSIS — J9690 Respiratory failure, unspecified, unspecified whether with hypoxia or hypercapnia: Secondary | ICD-10-CM | POA: Diagnosis not present

## 2016-07-07 DIAGNOSIS — J9620 Acute and chronic respiratory failure, unspecified whether with hypoxia or hypercapnia: Secondary | ICD-10-CM | POA: Diagnosis not present

## 2016-07-07 DIAGNOSIS — J9690 Respiratory failure, unspecified, unspecified whether with hypoxia or hypercapnia: Secondary | ICD-10-CM | POA: Diagnosis not present

## 2016-07-07 DIAGNOSIS — G610 Guillain-Barre syndrome: Secondary | ICD-10-CM | POA: Diagnosis not present

## 2016-07-08 DIAGNOSIS — F419 Anxiety disorder, unspecified: Secondary | ICD-10-CM | POA: Diagnosis not present

## 2016-07-08 DIAGNOSIS — Z452 Encounter for adjustment and management of vascular access device: Secondary | ICD-10-CM | POA: Diagnosis not present

## 2016-07-08 DIAGNOSIS — J9811 Atelectasis: Secondary | ICD-10-CM | POA: Diagnosis not present

## 2016-07-08 DIAGNOSIS — R0989 Other specified symptoms and signs involving the circulatory and respiratory systems: Secondary | ICD-10-CM | POA: Diagnosis not present

## 2016-07-08 DIAGNOSIS — F05 Delirium due to known physiological condition: Secondary | ICD-10-CM | POA: Diagnosis not present

## 2016-07-08 DIAGNOSIS — J9620 Acute and chronic respiratory failure, unspecified whether with hypoxia or hypercapnia: Secondary | ICD-10-CM | POA: Diagnosis not present

## 2016-07-08 DIAGNOSIS — G610 Guillain-Barre syndrome: Secondary | ICD-10-CM | POA: Diagnosis not present

## 2016-07-08 DIAGNOSIS — J9690 Respiratory failure, unspecified, unspecified whether with hypoxia or hypercapnia: Secondary | ICD-10-CM | POA: Diagnosis not present

## 2016-07-08 DIAGNOSIS — F331 Major depressive disorder, recurrent, moderate: Secondary | ICD-10-CM | POA: Diagnosis not present

## 2016-07-09 DIAGNOSIS — G610 Guillain-Barre syndrome: Secondary | ICD-10-CM | POA: Diagnosis not present

## 2016-07-09 DIAGNOSIS — J9620 Acute and chronic respiratory failure, unspecified whether with hypoxia or hypercapnia: Secondary | ICD-10-CM | POA: Diagnosis not present

## 2016-07-09 DIAGNOSIS — J9690 Respiratory failure, unspecified, unspecified whether with hypoxia or hypercapnia: Secondary | ICD-10-CM | POA: Diagnosis not present

## 2016-07-10 DIAGNOSIS — J9620 Acute and chronic respiratory failure, unspecified whether with hypoxia or hypercapnia: Secondary | ICD-10-CM | POA: Diagnosis not present

## 2016-07-10 DIAGNOSIS — J9690 Respiratory failure, unspecified, unspecified whether with hypoxia or hypercapnia: Secondary | ICD-10-CM | POA: Diagnosis not present

## 2016-07-10 DIAGNOSIS — G610 Guillain-Barre syndrome: Secondary | ICD-10-CM | POA: Diagnosis not present

## 2016-07-11 DIAGNOSIS — G610 Guillain-Barre syndrome: Secondary | ICD-10-CM | POA: Diagnosis not present

## 2016-07-11 DIAGNOSIS — J9690 Respiratory failure, unspecified, unspecified whether with hypoxia or hypercapnia: Secondary | ICD-10-CM | POA: Diagnosis not present

## 2016-07-11 DIAGNOSIS — J9620 Acute and chronic respiratory failure, unspecified whether with hypoxia or hypercapnia: Secondary | ICD-10-CM | POA: Diagnosis not present

## 2016-07-12 DIAGNOSIS — J9690 Respiratory failure, unspecified, unspecified whether with hypoxia or hypercapnia: Secondary | ICD-10-CM | POA: Diagnosis not present

## 2016-07-12 DIAGNOSIS — K922 Gastrointestinal hemorrhage, unspecified: Secondary | ICD-10-CM | POA: Diagnosis not present

## 2016-07-12 DIAGNOSIS — J9621 Acute and chronic respiratory failure with hypoxia: Secondary | ICD-10-CM | POA: Diagnosis not present

## 2016-07-12 DIAGNOSIS — K210 Gastro-esophageal reflux disease with esophagitis: Secondary | ICD-10-CM | POA: Diagnosis not present

## 2016-07-12 DIAGNOSIS — K9423 Gastrostomy malfunction: Secondary | ICD-10-CM | POA: Diagnosis not present

## 2016-07-12 DIAGNOSIS — G610 Guillain-Barre syndrome: Secondary | ICD-10-CM | POA: Diagnosis not present

## 2016-07-12 DIAGNOSIS — R1310 Dysphagia, unspecified: Secondary | ICD-10-CM | POA: Diagnosis not present

## 2016-07-13 DIAGNOSIS — F05 Delirium due to known physiological condition: Secondary | ICD-10-CM | POA: Diagnosis not present

## 2016-07-13 DIAGNOSIS — J9690 Respiratory failure, unspecified, unspecified whether with hypoxia or hypercapnia: Secondary | ICD-10-CM | POA: Diagnosis not present

## 2016-07-13 DIAGNOSIS — F331 Major depressive disorder, recurrent, moderate: Secondary | ICD-10-CM | POA: Diagnosis not present

## 2016-07-13 DIAGNOSIS — G610 Guillain-Barre syndrome: Secondary | ICD-10-CM | POA: Diagnosis not present

## 2016-07-13 DIAGNOSIS — F419 Anxiety disorder, unspecified: Secondary | ICD-10-CM | POA: Diagnosis not present

## 2016-07-13 DIAGNOSIS — Z9911 Dependence on respirator [ventilator] status: Secondary | ICD-10-CM | POA: Diagnosis not present

## 2016-07-13 DIAGNOSIS — J9621 Acute and chronic respiratory failure with hypoxia: Secondary | ICD-10-CM | POA: Diagnosis not present

## 2016-07-14 DIAGNOSIS — J9690 Respiratory failure, unspecified, unspecified whether with hypoxia or hypercapnia: Secondary | ICD-10-CM | POA: Diagnosis not present

## 2016-07-14 DIAGNOSIS — G610 Guillain-Barre syndrome: Secondary | ICD-10-CM | POA: Diagnosis not present

## 2016-07-14 DIAGNOSIS — J811 Chronic pulmonary edema: Secondary | ICD-10-CM | POA: Diagnosis not present

## 2016-07-14 DIAGNOSIS — J9621 Acute and chronic respiratory failure with hypoxia: Secondary | ICD-10-CM | POA: Diagnosis not present

## 2016-07-15 DIAGNOSIS — J90 Pleural effusion, not elsewhere classified: Secondary | ICD-10-CM | POA: Diagnosis not present

## 2016-07-15 DIAGNOSIS — R0901 Asphyxia: Secondary | ICD-10-CM | POA: Diagnosis not present

## 2016-07-15 DIAGNOSIS — J984 Other disorders of lung: Secondary | ICD-10-CM | POA: Diagnosis not present

## 2016-07-15 DIAGNOSIS — F419 Anxiety disorder, unspecified: Secondary | ICD-10-CM | POA: Diagnosis not present

## 2016-07-15 DIAGNOSIS — F05 Delirium due to known physiological condition: Secondary | ICD-10-CM | POA: Diagnosis not present

## 2016-07-15 DIAGNOSIS — F331 Major depressive disorder, recurrent, moderate: Secondary | ICD-10-CM | POA: Diagnosis not present

## 2016-07-15 DIAGNOSIS — J9690 Respiratory failure, unspecified, unspecified whether with hypoxia or hypercapnia: Secondary | ICD-10-CM | POA: Diagnosis not present

## 2016-07-15 DIAGNOSIS — K567 Ileus, unspecified: Secondary | ICD-10-CM | POA: Diagnosis not present

## 2016-07-15 DIAGNOSIS — G610 Guillain-Barre syndrome: Secondary | ICD-10-CM | POA: Diagnosis not present

## 2016-07-15 DIAGNOSIS — J9621 Acute and chronic respiratory failure with hypoxia: Secondary | ICD-10-CM | POA: Diagnosis not present

## 2016-07-16 ENCOUNTER — Ambulatory Visit: Payer: PPO | Admitting: Urology

## 2016-07-16 DIAGNOSIS — G610 Guillain-Barre syndrome: Secondary | ICD-10-CM | POA: Diagnosis not present

## 2016-07-16 DIAGNOSIS — J9690 Respiratory failure, unspecified, unspecified whether with hypoxia or hypercapnia: Secondary | ICD-10-CM | POA: Diagnosis not present

## 2016-07-16 DIAGNOSIS — J9621 Acute and chronic respiratory failure with hypoxia: Secondary | ICD-10-CM | POA: Diagnosis not present

## 2016-07-17 DIAGNOSIS — I5033 Acute on chronic diastolic (congestive) heart failure: Secondary | ICD-10-CM | POA: Diagnosis not present

## 2016-07-17 DIAGNOSIS — R6521 Severe sepsis with septic shock: Secondary | ICD-10-CM | POA: Diagnosis not present

## 2016-07-17 DIAGNOSIS — E43 Unspecified severe protein-calorie malnutrition: Secondary | ICD-10-CM | POA: Diagnosis not present

## 2016-07-17 DIAGNOSIS — G610 Guillain-Barre syndrome: Secondary | ICD-10-CM | POA: Diagnosis not present

## 2016-07-17 DIAGNOSIS — J9621 Acute and chronic respiratory failure with hypoxia: Secondary | ICD-10-CM | POA: Diagnosis not present

## 2016-07-18 DIAGNOSIS — I5033 Acute on chronic diastolic (congestive) heart failure: Secondary | ICD-10-CM | POA: Diagnosis not present

## 2016-07-18 DIAGNOSIS — R6521 Severe sepsis with septic shock: Secondary | ICD-10-CM | POA: Diagnosis not present

## 2016-07-18 DIAGNOSIS — J9621 Acute and chronic respiratory failure with hypoxia: Secondary | ICD-10-CM | POA: Diagnosis not present

## 2016-07-18 DIAGNOSIS — E43 Unspecified severe protein-calorie malnutrition: Secondary | ICD-10-CM | POA: Diagnosis not present

## 2016-07-18 DIAGNOSIS — G610 Guillain-Barre syndrome: Secondary | ICD-10-CM | POA: Diagnosis not present

## 2016-07-19 DIAGNOSIS — J9620 Acute and chronic respiratory failure, unspecified whether with hypoxia or hypercapnia: Secondary | ICD-10-CM | POA: Diagnosis not present

## 2016-07-19 DIAGNOSIS — G610 Guillain-Barre syndrome: Secondary | ICD-10-CM | POA: Diagnosis not present

## 2016-07-19 DIAGNOSIS — J9690 Respiratory failure, unspecified, unspecified whether with hypoxia or hypercapnia: Secondary | ICD-10-CM | POA: Diagnosis not present

## 2016-07-20 DIAGNOSIS — J9620 Acute and chronic respiratory failure, unspecified whether with hypoxia or hypercapnia: Secondary | ICD-10-CM | POA: Diagnosis not present

## 2016-07-20 DIAGNOSIS — G610 Guillain-Barre syndrome: Secondary | ICD-10-CM | POA: Diagnosis not present

## 2016-07-20 DIAGNOSIS — J9690 Respiratory failure, unspecified, unspecified whether with hypoxia or hypercapnia: Secondary | ICD-10-CM | POA: Diagnosis not present

## 2016-07-21 DIAGNOSIS — G610 Guillain-Barre syndrome: Secondary | ICD-10-CM | POA: Diagnosis not present

## 2016-07-21 DIAGNOSIS — J9620 Acute and chronic respiratory failure, unspecified whether with hypoxia or hypercapnia: Secondary | ICD-10-CM | POA: Diagnosis not present

## 2016-07-21 DIAGNOSIS — J9690 Respiratory failure, unspecified, unspecified whether with hypoxia or hypercapnia: Secondary | ICD-10-CM | POA: Diagnosis not present

## 2016-07-22 DIAGNOSIS — F05 Delirium due to known physiological condition: Secondary | ICD-10-CM | POA: Diagnosis not present

## 2016-07-22 DIAGNOSIS — J9690 Respiratory failure, unspecified, unspecified whether with hypoxia or hypercapnia: Secondary | ICD-10-CM | POA: Diagnosis not present

## 2016-07-22 DIAGNOSIS — K3184 Gastroparesis: Secondary | ICD-10-CM | POA: Diagnosis not present

## 2016-07-22 DIAGNOSIS — K311 Adult hypertrophic pyloric stenosis: Secondary | ICD-10-CM | POA: Diagnosis not present

## 2016-07-22 DIAGNOSIS — K9423 Gastrostomy malfunction: Secondary | ICD-10-CM | POA: Diagnosis not present

## 2016-07-22 DIAGNOSIS — G610 Guillain-Barre syndrome: Secondary | ICD-10-CM | POA: Diagnosis not present

## 2016-07-22 DIAGNOSIS — J9620 Acute and chronic respiratory failure, unspecified whether with hypoxia or hypercapnia: Secondary | ICD-10-CM | POA: Diagnosis not present

## 2016-07-22 DIAGNOSIS — R1310 Dysphagia, unspecified: Secondary | ICD-10-CM | POA: Diagnosis not present

## 2016-07-22 DIAGNOSIS — F331 Major depressive disorder, recurrent, moderate: Secondary | ICD-10-CM | POA: Diagnosis not present

## 2016-07-22 DIAGNOSIS — F419 Anxiety disorder, unspecified: Secondary | ICD-10-CM | POA: Diagnosis not present

## 2016-07-23 DIAGNOSIS — J9690 Respiratory failure, unspecified, unspecified whether with hypoxia or hypercapnia: Secondary | ICD-10-CM | POA: Diagnosis not present

## 2016-07-23 DIAGNOSIS — J9620 Acute and chronic respiratory failure, unspecified whether with hypoxia or hypercapnia: Secondary | ICD-10-CM | POA: Diagnosis not present

## 2016-07-23 DIAGNOSIS — G610 Guillain-Barre syndrome: Secondary | ICD-10-CM | POA: Diagnosis not present

## 2016-07-24 DIAGNOSIS — J9620 Acute and chronic respiratory failure, unspecified whether with hypoxia or hypercapnia: Secondary | ICD-10-CM | POA: Diagnosis not present

## 2016-07-24 DIAGNOSIS — G610 Guillain-Barre syndrome: Secondary | ICD-10-CM | POA: Diagnosis not present

## 2016-07-24 DIAGNOSIS — G9340 Encephalopathy, unspecified: Secondary | ICD-10-CM | POA: Diagnosis not present

## 2016-07-24 DIAGNOSIS — J189 Pneumonia, unspecified organism: Secondary | ICD-10-CM | POA: Diagnosis not present

## 2016-07-25 DIAGNOSIS — J189 Pneumonia, unspecified organism: Secondary | ICD-10-CM | POA: Diagnosis not present

## 2016-07-25 DIAGNOSIS — J9620 Acute and chronic respiratory failure, unspecified whether with hypoxia or hypercapnia: Secondary | ICD-10-CM | POA: Diagnosis not present

## 2016-07-25 DIAGNOSIS — G610 Guillain-Barre syndrome: Secondary | ICD-10-CM | POA: Diagnosis not present

## 2016-07-25 DIAGNOSIS — G9340 Encephalopathy, unspecified: Secondary | ICD-10-CM | POA: Diagnosis not present

## 2016-07-26 DIAGNOSIS — G610 Guillain-Barre syndrome: Secondary | ICD-10-CM | POA: Diagnosis not present

## 2016-07-26 DIAGNOSIS — F331 Major depressive disorder, recurrent, moderate: Secondary | ICD-10-CM | POA: Diagnosis not present

## 2016-07-26 DIAGNOSIS — J9690 Respiratory failure, unspecified, unspecified whether with hypoxia or hypercapnia: Secondary | ICD-10-CM | POA: Diagnosis not present

## 2016-07-26 DIAGNOSIS — F05 Delirium due to known physiological condition: Secondary | ICD-10-CM | POA: Diagnosis not present

## 2016-07-26 DIAGNOSIS — J9621 Acute and chronic respiratory failure with hypoxia: Secondary | ICD-10-CM | POA: Diagnosis not present

## 2016-07-26 DIAGNOSIS — F419 Anxiety disorder, unspecified: Secondary | ICD-10-CM | POA: Diagnosis not present

## 2016-07-27 DIAGNOSIS — J9690 Respiratory failure, unspecified, unspecified whether with hypoxia or hypercapnia: Secondary | ICD-10-CM | POA: Diagnosis not present

## 2016-07-27 DIAGNOSIS — J9621 Acute and chronic respiratory failure with hypoxia: Secondary | ICD-10-CM | POA: Diagnosis not present

## 2016-07-27 DIAGNOSIS — G610 Guillain-Barre syndrome: Secondary | ICD-10-CM | POA: Diagnosis not present

## 2016-07-28 DIAGNOSIS — G610 Guillain-Barre syndrome: Secondary | ICD-10-CM | POA: Diagnosis not present

## 2016-07-28 DIAGNOSIS — J9621 Acute and chronic respiratory failure with hypoxia: Secondary | ICD-10-CM | POA: Diagnosis not present

## 2016-07-28 DIAGNOSIS — J9690 Respiratory failure, unspecified, unspecified whether with hypoxia or hypercapnia: Secondary | ICD-10-CM | POA: Diagnosis not present

## 2016-07-29 DIAGNOSIS — J9621 Acute and chronic respiratory failure with hypoxia: Secondary | ICD-10-CM | POA: Diagnosis not present

## 2016-07-29 DIAGNOSIS — J9690 Respiratory failure, unspecified, unspecified whether with hypoxia or hypercapnia: Secondary | ICD-10-CM | POA: Diagnosis not present

## 2016-07-29 DIAGNOSIS — F05 Delirium due to known physiological condition: Secondary | ICD-10-CM | POA: Diagnosis not present

## 2016-07-29 DIAGNOSIS — M6281 Muscle weakness (generalized): Secondary | ICD-10-CM | POA: Diagnosis not present

## 2016-07-29 DIAGNOSIS — F419 Anxiety disorder, unspecified: Secondary | ICD-10-CM | POA: Diagnosis not present

## 2016-07-29 DIAGNOSIS — F331 Major depressive disorder, recurrent, moderate: Secondary | ICD-10-CM | POA: Diagnosis not present

## 2016-07-29 DIAGNOSIS — R5381 Other malaise: Secondary | ICD-10-CM | POA: Diagnosis not present

## 2016-07-29 DIAGNOSIS — G610 Guillain-Barre syndrome: Secondary | ICD-10-CM | POA: Diagnosis not present

## 2016-07-29 DIAGNOSIS — R4189 Other symptoms and signs involving cognitive functions and awareness: Secondary | ICD-10-CM | POA: Diagnosis not present

## 2016-07-30 DIAGNOSIS — M6281 Muscle weakness (generalized): Secondary | ICD-10-CM | POA: Diagnosis not present

## 2016-07-30 DIAGNOSIS — R5381 Other malaise: Secondary | ICD-10-CM | POA: Diagnosis not present

## 2016-07-30 DIAGNOSIS — G610 Guillain-Barre syndrome: Secondary | ICD-10-CM | POA: Diagnosis not present

## 2016-07-30 DIAGNOSIS — J9620 Acute and chronic respiratory failure, unspecified whether with hypoxia or hypercapnia: Secondary | ICD-10-CM | POA: Diagnosis not present

## 2016-07-30 DIAGNOSIS — J9690 Respiratory failure, unspecified, unspecified whether with hypoxia or hypercapnia: Secondary | ICD-10-CM | POA: Diagnosis not present

## 2016-07-30 DIAGNOSIS — R4189 Other symptoms and signs involving cognitive functions and awareness: Secondary | ICD-10-CM | POA: Diagnosis not present

## 2016-07-31 DIAGNOSIS — G610 Guillain-Barre syndrome: Secondary | ICD-10-CM | POA: Diagnosis not present

## 2016-07-31 DIAGNOSIS — J9690 Respiratory failure, unspecified, unspecified whether with hypoxia or hypercapnia: Secondary | ICD-10-CM | POA: Diagnosis not present

## 2016-07-31 DIAGNOSIS — R918 Other nonspecific abnormal finding of lung field: Secondary | ICD-10-CM | POA: Diagnosis not present

## 2016-07-31 DIAGNOSIS — J9620 Acute and chronic respiratory failure, unspecified whether with hypoxia or hypercapnia: Secondary | ICD-10-CM | POA: Diagnosis not present

## 2016-08-01 DIAGNOSIS — J9690 Respiratory failure, unspecified, unspecified whether with hypoxia or hypercapnia: Secondary | ICD-10-CM | POA: Diagnosis not present

## 2016-08-01 DIAGNOSIS — J9620 Acute and chronic respiratory failure, unspecified whether with hypoxia or hypercapnia: Secondary | ICD-10-CM | POA: Diagnosis not present

## 2016-08-01 DIAGNOSIS — G610 Guillain-Barre syndrome: Secondary | ICD-10-CM | POA: Diagnosis not present

## 2016-08-02 DIAGNOSIS — R5381 Other malaise: Secondary | ICD-10-CM | POA: Diagnosis not present

## 2016-08-02 DIAGNOSIS — R1310 Dysphagia, unspecified: Secondary | ICD-10-CM | POA: Diagnosis not present

## 2016-08-02 DIAGNOSIS — R4189 Other symptoms and signs involving cognitive functions and awareness: Secondary | ICD-10-CM | POA: Diagnosis not present

## 2016-08-02 DIAGNOSIS — M6281 Muscle weakness (generalized): Secondary | ICD-10-CM | POA: Diagnosis not present

## 2016-08-02 DIAGNOSIS — K3184 Gastroparesis: Secondary | ICD-10-CM | POA: Diagnosis not present

## 2016-08-02 DIAGNOSIS — K210 Gastro-esophageal reflux disease with esophagitis: Secondary | ICD-10-CM | POA: Diagnosis not present

## 2016-08-02 DIAGNOSIS — J9620 Acute and chronic respiratory failure, unspecified whether with hypoxia or hypercapnia: Secondary | ICD-10-CM | POA: Diagnosis not present

## 2016-08-02 DIAGNOSIS — F331 Major depressive disorder, recurrent, moderate: Secondary | ICD-10-CM | POA: Diagnosis not present

## 2016-08-02 DIAGNOSIS — F419 Anxiety disorder, unspecified: Secondary | ICD-10-CM | POA: Diagnosis not present

## 2016-08-02 DIAGNOSIS — K9423 Gastrostomy malfunction: Secondary | ICD-10-CM | POA: Diagnosis not present

## 2016-08-02 DIAGNOSIS — F05 Delirium due to known physiological condition: Secondary | ICD-10-CM | POA: Diagnosis not present

## 2016-08-02 DIAGNOSIS — G610 Guillain-Barre syndrome: Secondary | ICD-10-CM | POA: Diagnosis not present

## 2016-08-03 DIAGNOSIS — N1330 Unspecified hydronephrosis: Secondary | ICD-10-CM | POA: Diagnosis not present

## 2016-08-03 DIAGNOSIS — J9620 Acute and chronic respiratory failure, unspecified whether with hypoxia or hypercapnia: Secondary | ICD-10-CM | POA: Diagnosis not present

## 2016-08-03 DIAGNOSIS — N3289 Other specified disorders of bladder: Secondary | ICD-10-CM | POA: Diagnosis not present

## 2016-08-04 DIAGNOSIS — J9620 Acute and chronic respiratory failure, unspecified whether with hypoxia or hypercapnia: Secondary | ICD-10-CM | POA: Diagnosis not present

## 2016-08-04 DIAGNOSIS — J9690 Respiratory failure, unspecified, unspecified whether with hypoxia or hypercapnia: Secondary | ICD-10-CM | POA: Diagnosis not present

## 2016-08-04 DIAGNOSIS — G610 Guillain-Barre syndrome: Secondary | ICD-10-CM | POA: Diagnosis not present

## 2016-08-05 DIAGNOSIS — J9620 Acute and chronic respiratory failure, unspecified whether with hypoxia or hypercapnia: Secondary | ICD-10-CM | POA: Diagnosis not present

## 2016-08-05 DIAGNOSIS — J9690 Respiratory failure, unspecified, unspecified whether with hypoxia or hypercapnia: Secondary | ICD-10-CM | POA: Diagnosis not present

## 2016-08-05 DIAGNOSIS — M6281 Muscle weakness (generalized): Secondary | ICD-10-CM | POA: Diagnosis not present

## 2016-08-05 DIAGNOSIS — R4189 Other symptoms and signs involving cognitive functions and awareness: Secondary | ICD-10-CM | POA: Diagnosis not present

## 2016-08-05 DIAGNOSIS — G610 Guillain-Barre syndrome: Secondary | ICD-10-CM | POA: Diagnosis not present

## 2016-08-05 DIAGNOSIS — R5381 Other malaise: Secondary | ICD-10-CM | POA: Diagnosis not present

## 2016-08-06 DIAGNOSIS — G610 Guillain-Barre syndrome: Secondary | ICD-10-CM | POA: Diagnosis not present

## 2016-08-06 DIAGNOSIS — Z9911 Dependence on respirator [ventilator] status: Secondary | ICD-10-CM | POA: Diagnosis not present

## 2016-08-06 DIAGNOSIS — J9690 Respiratory failure, unspecified, unspecified whether with hypoxia or hypercapnia: Secondary | ICD-10-CM | POA: Diagnosis not present

## 2016-08-06 DIAGNOSIS — J9621 Acute and chronic respiratory failure with hypoxia: Secondary | ICD-10-CM | POA: Diagnosis not present

## 2016-08-06 DIAGNOSIS — J189 Pneumonia, unspecified organism: Secondary | ICD-10-CM | POA: Diagnosis not present

## 2016-08-07 DIAGNOSIS — Z9911 Dependence on respirator [ventilator] status: Secondary | ICD-10-CM | POA: Diagnosis not present

## 2016-08-07 DIAGNOSIS — G610 Guillain-Barre syndrome: Secondary | ICD-10-CM | POA: Diagnosis not present

## 2016-08-07 DIAGNOSIS — J9621 Acute and chronic respiratory failure with hypoxia: Secondary | ICD-10-CM | POA: Diagnosis not present

## 2016-08-07 DIAGNOSIS — J189 Pneumonia, unspecified organism: Secondary | ICD-10-CM | POA: Diagnosis not present

## 2016-08-07 DIAGNOSIS — G9340 Encephalopathy, unspecified: Secondary | ICD-10-CM | POA: Diagnosis not present

## 2016-08-08 DIAGNOSIS — J189 Pneumonia, unspecified organism: Secondary | ICD-10-CM | POA: Diagnosis not present

## 2016-08-08 DIAGNOSIS — G610 Guillain-Barre syndrome: Secondary | ICD-10-CM | POA: Diagnosis not present

## 2016-08-08 DIAGNOSIS — Z9911 Dependence on respirator [ventilator] status: Secondary | ICD-10-CM | POA: Diagnosis not present

## 2016-08-08 DIAGNOSIS — G9340 Encephalopathy, unspecified: Secondary | ICD-10-CM | POA: Diagnosis not present

## 2016-08-08 DIAGNOSIS — J9621 Acute and chronic respiratory failure with hypoxia: Secondary | ICD-10-CM | POA: Diagnosis not present

## 2016-08-09 DIAGNOSIS — Z9911 Dependence on respirator [ventilator] status: Secondary | ICD-10-CM | POA: Diagnosis not present

## 2016-08-09 DIAGNOSIS — F05 Delirium due to known physiological condition: Secondary | ICD-10-CM | POA: Diagnosis not present

## 2016-08-09 DIAGNOSIS — J9621 Acute and chronic respiratory failure with hypoxia: Secondary | ICD-10-CM | POA: Diagnosis not present

## 2016-08-09 DIAGNOSIS — J9690 Respiratory failure, unspecified, unspecified whether with hypoxia or hypercapnia: Secondary | ICD-10-CM | POA: Diagnosis not present

## 2016-08-09 DIAGNOSIS — F331 Major depressive disorder, recurrent, moderate: Secondary | ICD-10-CM | POA: Diagnosis not present

## 2016-08-09 DIAGNOSIS — R2233 Localized swelling, mass and lump, upper limb, bilateral: Secondary | ICD-10-CM | POA: Diagnosis not present

## 2016-08-09 DIAGNOSIS — J189 Pneumonia, unspecified organism: Secondary | ICD-10-CM | POA: Diagnosis not present

## 2016-08-09 DIAGNOSIS — G610 Guillain-Barre syndrome: Secondary | ICD-10-CM | POA: Diagnosis not present

## 2016-08-09 DIAGNOSIS — F419 Anxiety disorder, unspecified: Secondary | ICD-10-CM | POA: Diagnosis not present

## 2016-08-10 DIAGNOSIS — M6281 Muscle weakness (generalized): Secondary | ICD-10-CM | POA: Diagnosis not present

## 2016-08-10 DIAGNOSIS — Z9911 Dependence on respirator [ventilator] status: Secondary | ICD-10-CM | POA: Diagnosis not present

## 2016-08-10 DIAGNOSIS — R1312 Dysphagia, oropharyngeal phase: Secondary | ICD-10-CM | POA: Diagnosis not present

## 2016-08-10 DIAGNOSIS — Z430 Encounter for attention to tracheostomy: Secondary | ICD-10-CM | POA: Diagnosis not present

## 2016-08-10 DIAGNOSIS — J9621 Acute and chronic respiratory failure with hypoxia: Secondary | ICD-10-CM | POA: Diagnosis not present

## 2016-08-10 DIAGNOSIS — I5032 Chronic diastolic (congestive) heart failure: Secondary | ICD-10-CM | POA: Diagnosis not present

## 2016-08-10 DIAGNOSIS — J9690 Respiratory failure, unspecified, unspecified whether with hypoxia or hypercapnia: Secondary | ICD-10-CM | POA: Diagnosis not present

## 2016-08-10 DIAGNOSIS — J9620 Acute and chronic respiratory failure, unspecified whether with hypoxia or hypercapnia: Secondary | ICD-10-CM | POA: Diagnosis not present

## 2016-08-10 DIAGNOSIS — G610 Guillain-Barre syndrome: Secondary | ICD-10-CM | POA: Diagnosis not present

## 2016-08-10 DIAGNOSIS — I10 Essential (primary) hypertension: Secondary | ICD-10-CM | POA: Diagnosis not present

## 2016-08-10 DIAGNOSIS — E43 Unspecified severe protein-calorie malnutrition: Secondary | ICD-10-CM | POA: Diagnosis not present

## 2016-08-10 DIAGNOSIS — Z431 Encounter for attention to gastrostomy: Secondary | ICD-10-CM | POA: Diagnosis not present

## 2016-08-13 DIAGNOSIS — J9600 Acute respiratory failure, unspecified whether with hypoxia or hypercapnia: Secondary | ICD-10-CM | POA: Diagnosis not present

## 2016-08-13 DIAGNOSIS — Z9911 Dependence on respirator [ventilator] status: Secondary | ICD-10-CM | POA: Diagnosis not present

## 2016-08-23 DIAGNOSIS — Z930 Tracheostomy status: Secondary | ICD-10-CM | POA: Diagnosis not present

## 2016-08-24 DIAGNOSIS — Z9911 Dependence on respirator [ventilator] status: Secondary | ICD-10-CM | POA: Diagnosis not present

## 2016-08-24 DIAGNOSIS — J9600 Acute respiratory failure, unspecified whether with hypoxia or hypercapnia: Secondary | ICD-10-CM | POA: Diagnosis not present

## 2016-08-26 DIAGNOSIS — R5381 Other malaise: Secondary | ICD-10-CM | POA: Diagnosis not present

## 2016-08-26 DIAGNOSIS — R2689 Other abnormalities of gait and mobility: Secondary | ICD-10-CM | POA: Diagnosis not present

## 2016-08-26 DIAGNOSIS — M545 Low back pain: Secondary | ICD-10-CM | POA: Diagnosis not present

## 2016-08-26 DIAGNOSIS — M6281 Muscle weakness (generalized): Secondary | ICD-10-CM | POA: Diagnosis not present

## 2016-08-27 DIAGNOSIS — Z930 Tracheostomy status: Secondary | ICD-10-CM | POA: Diagnosis not present

## 2016-08-29 DIAGNOSIS — J9600 Acute respiratory failure, unspecified whether with hypoxia or hypercapnia: Secondary | ICD-10-CM | POA: Diagnosis not present

## 2016-08-29 DIAGNOSIS — Z9911 Dependence on respirator [ventilator] status: Secondary | ICD-10-CM | POA: Diagnosis not present

## 2016-08-31 DIAGNOSIS — Z431 Encounter for attention to gastrostomy: Secondary | ICD-10-CM | POA: Diagnosis not present

## 2016-08-31 DIAGNOSIS — R1312 Dysphagia, oropharyngeal phase: Secondary | ICD-10-CM | POA: Diagnosis not present

## 2016-08-31 DIAGNOSIS — Z430 Encounter for attention to tracheostomy: Secondary | ICD-10-CM | POA: Diagnosis not present

## 2016-08-31 DIAGNOSIS — M6281 Muscle weakness (generalized): Secondary | ICD-10-CM | POA: Diagnosis not present

## 2016-08-31 DIAGNOSIS — Z9911 Dependence on respirator [ventilator] status: Secondary | ICD-10-CM | POA: Diagnosis not present

## 2016-08-31 DIAGNOSIS — I5032 Chronic diastolic (congestive) heart failure: Secondary | ICD-10-CM | POA: Diagnosis not present

## 2016-08-31 DIAGNOSIS — G610 Guillain-Barre syndrome: Secondary | ICD-10-CM | POA: Diagnosis not present

## 2016-08-31 DIAGNOSIS — I10 Essential (primary) hypertension: Secondary | ICD-10-CM | POA: Diagnosis not present

## 2016-08-31 DIAGNOSIS — E43 Unspecified severe protein-calorie malnutrition: Secondary | ICD-10-CM | POA: Diagnosis not present

## 2016-08-31 DIAGNOSIS — J9620 Acute and chronic respiratory failure, unspecified whether with hypoxia or hypercapnia: Secondary | ICD-10-CM | POA: Diagnosis not present

## 2016-09-02 DIAGNOSIS — M6281 Muscle weakness (generalized): Secondary | ICD-10-CM | POA: Diagnosis not present

## 2016-09-02 DIAGNOSIS — R2689 Other abnormalities of gait and mobility: Secondary | ICD-10-CM | POA: Diagnosis not present

## 2016-09-02 DIAGNOSIS — M545 Low back pain: Secondary | ICD-10-CM | POA: Diagnosis not present

## 2016-09-02 DIAGNOSIS — R5381 Other malaise: Secondary | ICD-10-CM | POA: Diagnosis not present

## 2016-09-06 DIAGNOSIS — R911 Solitary pulmonary nodule: Secondary | ICD-10-CM | POA: Diagnosis not present

## 2016-09-06 DIAGNOSIS — Z4682 Encounter for fitting and adjustment of non-vascular catheter: Secondary | ICD-10-CM | POA: Diagnosis not present

## 2016-09-07 DIAGNOSIS — J984 Other disorders of lung: Secondary | ICD-10-CM | POA: Diagnosis not present

## 2016-09-07 DIAGNOSIS — R5381 Other malaise: Secondary | ICD-10-CM | POA: Diagnosis not present

## 2016-09-07 DIAGNOSIS — M6281 Muscle weakness (generalized): Secondary | ICD-10-CM | POA: Diagnosis not present

## 2016-09-07 DIAGNOSIS — Z9911 Dependence on respirator [ventilator] status: Secondary | ICD-10-CM | POA: Diagnosis not present

## 2016-09-07 DIAGNOSIS — R2689 Other abnormalities of gait and mobility: Secondary | ICD-10-CM | POA: Diagnosis not present

## 2016-09-07 DIAGNOSIS — J9600 Acute respiratory failure, unspecified whether with hypoxia or hypercapnia: Secondary | ICD-10-CM | POA: Diagnosis not present

## 2016-09-07 DIAGNOSIS — M545 Low back pain: Secondary | ICD-10-CM | POA: Diagnosis not present

## 2016-09-10 DIAGNOSIS — Z9911 Dependence on respirator [ventilator] status: Secondary | ICD-10-CM | POA: Diagnosis not present

## 2016-09-10 DIAGNOSIS — J9600 Acute respiratory failure, unspecified whether with hypoxia or hypercapnia: Secondary | ICD-10-CM | POA: Diagnosis not present

## 2016-09-19 DIAGNOSIS — J9620 Acute and chronic respiratory failure, unspecified whether with hypoxia or hypercapnia: Secondary | ICD-10-CM | POA: Diagnosis not present

## 2016-09-20 DIAGNOSIS — N2889 Other specified disorders of kidney and ureter: Secondary | ICD-10-CM | POA: Diagnosis not present

## 2016-09-20 DIAGNOSIS — K7689 Other specified diseases of liver: Secondary | ICD-10-CM | POA: Diagnosis not present

## 2016-09-20 DIAGNOSIS — J181 Lobar pneumonia, unspecified organism: Secondary | ICD-10-CM | POA: Diagnosis not present

## 2016-09-20 DIAGNOSIS — R918 Other nonspecific abnormal finding of lung field: Secondary | ICD-10-CM | POA: Diagnosis not present

## 2016-09-20 DIAGNOSIS — R509 Fever, unspecified: Secondary | ICD-10-CM | POA: Diagnosis not present

## 2016-09-21 DIAGNOSIS — J9600 Acute respiratory failure, unspecified whether with hypoxia or hypercapnia: Secondary | ICD-10-CM | POA: Diagnosis not present

## 2016-09-21 DIAGNOSIS — Z9911 Dependence on respirator [ventilator] status: Secondary | ICD-10-CM | POA: Diagnosis not present

## 2016-09-24 DIAGNOSIS — J9600 Acute respiratory failure, unspecified whether with hypoxia or hypercapnia: Secondary | ICD-10-CM | POA: Diagnosis not present

## 2016-09-24 DIAGNOSIS — Z9911 Dependence on respirator [ventilator] status: Secondary | ICD-10-CM | POA: Diagnosis not present

## 2016-09-29 DIAGNOSIS — R5381 Other malaise: Secondary | ICD-10-CM | POA: Diagnosis not present

## 2016-09-29 DIAGNOSIS — M545 Low back pain: Secondary | ICD-10-CM | POA: Diagnosis not present

## 2016-09-29 DIAGNOSIS — M6281 Muscle weakness (generalized): Secondary | ICD-10-CM | POA: Diagnosis not present

## 2016-09-29 DIAGNOSIS — R2689 Other abnormalities of gait and mobility: Secondary | ICD-10-CM | POA: Diagnosis not present

## 2016-09-30 DIAGNOSIS — Z4659 Encounter for fitting and adjustment of other gastrointestinal appliance and device: Secondary | ICD-10-CM | POA: Diagnosis not present

## 2016-10-01 DIAGNOSIS — K9423 Gastrostomy malfunction: Secondary | ICD-10-CM | POA: Diagnosis not present

## 2016-10-01 DIAGNOSIS — G610 Guillain-Barre syndrome: Secondary | ICD-10-CM | POA: Diagnosis not present

## 2016-10-01 DIAGNOSIS — E43 Unspecified severe protein-calorie malnutrition: Secondary | ICD-10-CM | POA: Diagnosis not present

## 2016-10-01 DIAGNOSIS — E785 Hyperlipidemia, unspecified: Secondary | ICD-10-CM | POA: Diagnosis not present

## 2016-10-01 DIAGNOSIS — Z430 Encounter for attention to tracheostomy: Secondary | ICD-10-CM | POA: Diagnosis not present

## 2016-10-01 DIAGNOSIS — R1310 Dysphagia, unspecified: Secondary | ICD-10-CM | POA: Diagnosis not present

## 2016-10-01 DIAGNOSIS — M6281 Muscle weakness (generalized): Secondary | ICD-10-CM | POA: Diagnosis not present

## 2016-10-01 DIAGNOSIS — J189 Pneumonia, unspecified organism: Secondary | ICD-10-CM | POA: Diagnosis not present

## 2016-10-01 DIAGNOSIS — J9610 Chronic respiratory failure, unspecified whether with hypoxia or hypercapnia: Secondary | ICD-10-CM | POA: Diagnosis not present

## 2016-10-01 DIAGNOSIS — K3184 Gastroparesis: Secondary | ICD-10-CM | POA: Diagnosis not present

## 2016-10-01 DIAGNOSIS — R918 Other nonspecific abnormal finding of lung field: Secondary | ICD-10-CM | POA: Diagnosis not present

## 2016-10-01 DIAGNOSIS — R338 Other retention of urine: Secondary | ICD-10-CM | POA: Diagnosis not present

## 2016-10-01 DIAGNOSIS — J9600 Acute respiratory failure, unspecified whether with hypoxia or hypercapnia: Secondary | ICD-10-CM | POA: Diagnosis not present

## 2016-10-01 DIAGNOSIS — E1151 Type 2 diabetes mellitus with diabetic peripheral angiopathy without gangrene: Secondary | ICD-10-CM | POA: Diagnosis not present

## 2016-10-01 DIAGNOSIS — Z930 Tracheostomy status: Secondary | ICD-10-CM | POA: Diagnosis not present

## 2016-10-01 DIAGNOSIS — N319 Neuromuscular dysfunction of bladder, unspecified: Secondary | ICD-10-CM | POA: Diagnosis not present

## 2016-10-01 DIAGNOSIS — R5381 Other malaise: Secondary | ICD-10-CM | POA: Diagnosis not present

## 2016-10-01 DIAGNOSIS — Z431 Encounter for attention to gastrostomy: Secondary | ICD-10-CM | POA: Diagnosis not present

## 2016-10-01 DIAGNOSIS — I5032 Chronic diastolic (congestive) heart failure: Secondary | ICD-10-CM | POA: Diagnosis not present

## 2016-10-01 DIAGNOSIS — J9620 Acute and chronic respiratory failure, unspecified whether with hypoxia or hypercapnia: Secondary | ICD-10-CM | POA: Diagnosis not present

## 2016-10-01 DIAGNOSIS — J984 Other disorders of lung: Secondary | ICD-10-CM | POA: Diagnosis not present

## 2016-10-01 DIAGNOSIS — R1312 Dysphagia, oropharyngeal phase: Secondary | ICD-10-CM | POA: Diagnosis not present

## 2016-10-01 DIAGNOSIS — Z87891 Personal history of nicotine dependence: Secondary | ICD-10-CM | POA: Diagnosis not present

## 2016-10-01 DIAGNOSIS — G7000 Myasthenia gravis without (acute) exacerbation: Secondary | ICD-10-CM | POA: Diagnosis not present

## 2016-10-01 DIAGNOSIS — I10 Essential (primary) hypertension: Secondary | ICD-10-CM | POA: Diagnosis not present

## 2016-10-01 DIAGNOSIS — R2689 Other abnormalities of gait and mobility: Secondary | ICD-10-CM | POA: Diagnosis not present

## 2016-10-01 DIAGNOSIS — R0989 Other specified symptoms and signs involving the circulatory and respiratory systems: Secondary | ICD-10-CM | POA: Diagnosis not present

## 2016-10-01 DIAGNOSIS — M545 Low back pain: Secondary | ICD-10-CM | POA: Diagnosis not present

## 2016-10-01 DIAGNOSIS — Z9911 Dependence on respirator [ventilator] status: Secondary | ICD-10-CM | POA: Diagnosis not present

## 2016-10-05 DIAGNOSIS — J9600 Acute respiratory failure, unspecified whether with hypoxia or hypercapnia: Secondary | ICD-10-CM | POA: Diagnosis not present

## 2016-10-05 DIAGNOSIS — Z9911 Dependence on respirator [ventilator] status: Secondary | ICD-10-CM | POA: Diagnosis not present

## 2016-10-06 DIAGNOSIS — R5381 Other malaise: Secondary | ICD-10-CM | POA: Diagnosis not present

## 2016-10-06 DIAGNOSIS — R2689 Other abnormalities of gait and mobility: Secondary | ICD-10-CM | POA: Diagnosis not present

## 2016-10-06 DIAGNOSIS — M545 Low back pain: Secondary | ICD-10-CM | POA: Diagnosis not present

## 2016-10-06 DIAGNOSIS — M6281 Muscle weakness (generalized): Secondary | ICD-10-CM | POA: Diagnosis not present

## 2016-10-07 DIAGNOSIS — Z9911 Dependence on respirator [ventilator] status: Secondary | ICD-10-CM | POA: Diagnosis not present

## 2016-10-07 DIAGNOSIS — K3184 Gastroparesis: Secondary | ICD-10-CM | POA: Diagnosis not present

## 2016-10-07 DIAGNOSIS — G7000 Myasthenia gravis without (acute) exacerbation: Secondary | ICD-10-CM | POA: Diagnosis not present

## 2016-10-07 DIAGNOSIS — R1310 Dysphagia, unspecified: Secondary | ICD-10-CM | POA: Diagnosis not present

## 2016-10-07 DIAGNOSIS — K9423 Gastrostomy malfunction: Secondary | ICD-10-CM | POA: Diagnosis not present

## 2016-10-08 DIAGNOSIS — R2689 Other abnormalities of gait and mobility: Secondary | ICD-10-CM | POA: Diagnosis not present

## 2016-10-08 DIAGNOSIS — J9600 Acute respiratory failure, unspecified whether with hypoxia or hypercapnia: Secondary | ICD-10-CM | POA: Diagnosis not present

## 2016-10-08 DIAGNOSIS — Z9911 Dependence on respirator [ventilator] status: Secondary | ICD-10-CM | POA: Diagnosis not present

## 2016-10-08 DIAGNOSIS — M6281 Muscle weakness (generalized): Secondary | ICD-10-CM | POA: Diagnosis not present

## 2016-10-08 DIAGNOSIS — M545 Low back pain: Secondary | ICD-10-CM | POA: Diagnosis not present

## 2016-10-08 DIAGNOSIS — R5381 Other malaise: Secondary | ICD-10-CM | POA: Diagnosis not present

## 2016-10-17 DIAGNOSIS — J9620 Acute and chronic respiratory failure, unspecified whether with hypoxia or hypercapnia: Secondary | ICD-10-CM | POA: Diagnosis not present

## 2016-10-18 DIAGNOSIS — J9600 Acute respiratory failure, unspecified whether with hypoxia or hypercapnia: Secondary | ICD-10-CM | POA: Diagnosis not present

## 2016-10-21 DIAGNOSIS — Z9911 Dependence on respirator [ventilator] status: Secondary | ICD-10-CM | POA: Diagnosis not present

## 2016-10-21 DIAGNOSIS — J9600 Acute respiratory failure, unspecified whether with hypoxia or hypercapnia: Secondary | ICD-10-CM | POA: Diagnosis not present

## 2016-10-31 DIAGNOSIS — Z431 Encounter for attention to gastrostomy: Secondary | ICD-10-CM | POA: Diagnosis not present

## 2016-10-31 DIAGNOSIS — Z9911 Dependence on respirator [ventilator] status: Secondary | ICD-10-CM | POA: Diagnosis not present

## 2016-10-31 DIAGNOSIS — R1312 Dysphagia, oropharyngeal phase: Secondary | ICD-10-CM | POA: Diagnosis not present

## 2016-10-31 DIAGNOSIS — E43 Unspecified severe protein-calorie malnutrition: Secondary | ICD-10-CM | POA: Diagnosis not present

## 2016-10-31 DIAGNOSIS — M6281 Muscle weakness (generalized): Secondary | ICD-10-CM | POA: Diagnosis not present

## 2016-10-31 DIAGNOSIS — I5032 Chronic diastolic (congestive) heart failure: Secondary | ICD-10-CM | POA: Diagnosis not present

## 2016-10-31 DIAGNOSIS — Z430 Encounter for attention to tracheostomy: Secondary | ICD-10-CM | POA: Diagnosis not present

## 2016-10-31 DIAGNOSIS — I10 Essential (primary) hypertension: Secondary | ICD-10-CM | POA: Diagnosis not present

## 2016-10-31 DIAGNOSIS — J9620 Acute and chronic respiratory failure, unspecified whether with hypoxia or hypercapnia: Secondary | ICD-10-CM | POA: Diagnosis not present

## 2016-10-31 DIAGNOSIS — G610 Guillain-Barre syndrome: Secondary | ICD-10-CM | POA: Diagnosis not present

## 2016-11-02 DIAGNOSIS — Z9911 Dependence on respirator [ventilator] status: Secondary | ICD-10-CM | POA: Diagnosis not present

## 2016-11-02 DIAGNOSIS — J9600 Acute respiratory failure, unspecified whether with hypoxia or hypercapnia: Secondary | ICD-10-CM | POA: Diagnosis not present

## 2016-11-04 DIAGNOSIS — Z9911 Dependence on respirator [ventilator] status: Secondary | ICD-10-CM | POA: Diagnosis not present

## 2016-11-04 DIAGNOSIS — R1310 Dysphagia, unspecified: Secondary | ICD-10-CM | POA: Diagnosis not present

## 2016-11-04 DIAGNOSIS — K3184 Gastroparesis: Secondary | ICD-10-CM | POA: Diagnosis not present

## 2016-11-04 DIAGNOSIS — K9423 Gastrostomy malfunction: Secondary | ICD-10-CM | POA: Diagnosis not present

## 2016-11-04 DIAGNOSIS — G7000 Myasthenia gravis without (acute) exacerbation: Secondary | ICD-10-CM | POA: Diagnosis not present

## 2016-11-05 DIAGNOSIS — J9600 Acute respiratory failure, unspecified whether with hypoxia or hypercapnia: Secondary | ICD-10-CM | POA: Diagnosis not present

## 2016-11-05 DIAGNOSIS — Z9911 Dependence on respirator [ventilator] status: Secondary | ICD-10-CM | POA: Diagnosis not present

## 2016-11-07 DIAGNOSIS — Z930 Tracheostomy status: Secondary | ICD-10-CM | POA: Diagnosis not present

## 2016-11-07 DIAGNOSIS — R918 Other nonspecific abnormal finding of lung field: Secondary | ICD-10-CM | POA: Diagnosis not present

## 2016-11-10 DIAGNOSIS — I10 Essential (primary) hypertension: Secondary | ICD-10-CM | POA: Diagnosis not present

## 2016-11-10 DIAGNOSIS — R918 Other nonspecific abnormal finding of lung field: Secondary | ICD-10-CM | POA: Diagnosis not present

## 2016-11-10 DIAGNOSIS — Z430 Encounter for attention to tracheostomy: Secondary | ICD-10-CM | POA: Diagnosis not present

## 2016-11-10 DIAGNOSIS — J9610 Chronic respiratory failure, unspecified whether with hypoxia or hypercapnia: Secondary | ICD-10-CM | POA: Diagnosis not present

## 2016-11-10 DIAGNOSIS — G610 Guillain-Barre syndrome: Secondary | ICD-10-CM | POA: Diagnosis not present

## 2016-11-10 DIAGNOSIS — Z87891 Personal history of nicotine dependence: Secondary | ICD-10-CM | POA: Diagnosis not present

## 2016-11-10 DIAGNOSIS — Z930 Tracheostomy status: Secondary | ICD-10-CM | POA: Diagnosis not present

## 2016-11-10 DIAGNOSIS — E1151 Type 2 diabetes mellitus with diabetic peripheral angiopathy without gangrene: Secondary | ICD-10-CM | POA: Diagnosis not present

## 2016-11-10 DIAGNOSIS — R338 Other retention of urine: Secondary | ICD-10-CM | POA: Diagnosis not present

## 2016-11-10 DIAGNOSIS — N319 Neuromuscular dysfunction of bladder, unspecified: Secondary | ICD-10-CM | POA: Diagnosis not present

## 2016-11-10 DIAGNOSIS — E785 Hyperlipidemia, unspecified: Secondary | ICD-10-CM | POA: Diagnosis not present

## 2016-11-14 DIAGNOSIS — R918 Other nonspecific abnormal finding of lung field: Secondary | ICD-10-CM | POA: Diagnosis not present

## 2016-11-16 DIAGNOSIS — Z9911 Dependence on respirator [ventilator] status: Secondary | ICD-10-CM | POA: Diagnosis not present

## 2016-11-16 DIAGNOSIS — J9600 Acute respiratory failure, unspecified whether with hypoxia or hypercapnia: Secondary | ICD-10-CM | POA: Diagnosis not present

## 2016-11-18 DIAGNOSIS — J9620 Acute and chronic respiratory failure, unspecified whether with hypoxia or hypercapnia: Secondary | ICD-10-CM | POA: Diagnosis not present

## 2016-11-18 DIAGNOSIS — M6281 Muscle weakness (generalized): Secondary | ICD-10-CM | POA: Diagnosis not present

## 2016-11-18 DIAGNOSIS — R1312 Dysphagia, oropharyngeal phase: Secondary | ICD-10-CM | POA: Diagnosis not present

## 2016-11-19 DIAGNOSIS — Z9911 Dependence on respirator [ventilator] status: Secondary | ICD-10-CM | POA: Diagnosis not present

## 2016-11-19 DIAGNOSIS — J9600 Acute respiratory failure, unspecified whether with hypoxia or hypercapnia: Secondary | ICD-10-CM | POA: Diagnosis not present

## 2016-11-24 DIAGNOSIS — G610 Guillain-Barre syndrome: Secondary | ICD-10-CM | POA: Diagnosis not present

## 2016-11-24 DIAGNOSIS — I739 Peripheral vascular disease, unspecified: Secondary | ICD-10-CM | POA: Diagnosis not present

## 2016-11-24 DIAGNOSIS — J9610 Chronic respiratory failure, unspecified whether with hypoxia or hypercapnia: Secondary | ICD-10-CM | POA: Diagnosis not present

## 2016-11-24 DIAGNOSIS — Z9911 Dependence on respirator [ventilator] status: Secondary | ICD-10-CM | POA: Diagnosis not present

## 2016-11-24 DIAGNOSIS — E1169 Type 2 diabetes mellitus with other specified complication: Secondary | ICD-10-CM | POA: Diagnosis not present

## 2016-11-24 DIAGNOSIS — E785 Hyperlipidemia, unspecified: Secondary | ICD-10-CM | POA: Diagnosis not present

## 2016-11-24 DIAGNOSIS — Z430 Encounter for attention to tracheostomy: Secondary | ICD-10-CM | POA: Diagnosis not present

## 2016-11-24 DIAGNOSIS — I10 Essential (primary) hypertension: Secondary | ICD-10-CM | POA: Diagnosis not present

## 2016-11-30 DIAGNOSIS — Z9911 Dependence on respirator [ventilator] status: Secondary | ICD-10-CM | POA: Diagnosis not present

## 2016-11-30 DIAGNOSIS — J9600 Acute respiratory failure, unspecified whether with hypoxia or hypercapnia: Secondary | ICD-10-CM | POA: Diagnosis not present

## 2016-12-01 DIAGNOSIS — J9620 Acute and chronic respiratory failure, unspecified whether with hypoxia or hypercapnia: Secondary | ICD-10-CM | POA: Diagnosis not present

## 2016-12-01 DIAGNOSIS — M6281 Muscle weakness (generalized): Secondary | ICD-10-CM | POA: Diagnosis not present

## 2016-12-01 DIAGNOSIS — R1312 Dysphagia, oropharyngeal phase: Secondary | ICD-10-CM | POA: Diagnosis not present

## 2016-12-03 DIAGNOSIS — J9600 Acute respiratory failure, unspecified whether with hypoxia or hypercapnia: Secondary | ICD-10-CM | POA: Diagnosis not present

## 2016-12-03 DIAGNOSIS — Z9911 Dependence on respirator [ventilator] status: Secondary | ICD-10-CM | POA: Diagnosis not present

## 2016-12-06 DIAGNOSIS — G610 Guillain-Barre syndrome: Secondary | ICD-10-CM | POA: Diagnosis not present

## 2016-12-06 DIAGNOSIS — R918 Other nonspecific abnormal finding of lung field: Secondary | ICD-10-CM | POA: Diagnosis not present

## 2016-12-06 DIAGNOSIS — I10 Essential (primary) hypertension: Secondary | ICD-10-CM | POA: Diagnosis not present

## 2016-12-06 DIAGNOSIS — E1169 Type 2 diabetes mellitus with other specified complication: Secondary | ICD-10-CM | POA: Diagnosis not present

## 2016-12-06 DIAGNOSIS — J9610 Chronic respiratory failure, unspecified whether with hypoxia or hypercapnia: Secondary | ICD-10-CM | POA: Diagnosis not present

## 2016-12-06 DIAGNOSIS — Z430 Encounter for attention to tracheostomy: Secondary | ICD-10-CM | POA: Diagnosis not present

## 2016-12-06 DIAGNOSIS — Z9911 Dependence on respirator [ventilator] status: Secondary | ICD-10-CM | POA: Diagnosis not present

## 2016-12-06 DIAGNOSIS — E785 Hyperlipidemia, unspecified: Secondary | ICD-10-CM | POA: Diagnosis not present

## 2016-12-06 DIAGNOSIS — I739 Peripheral vascular disease, unspecified: Secondary | ICD-10-CM | POA: Diagnosis not present

## 2016-12-10 DIAGNOSIS — Z430 Encounter for attention to tracheostomy: Secondary | ICD-10-CM | POA: Diagnosis not present

## 2016-12-12 ENCOUNTER — Inpatient Hospital Stay (HOSPITAL_COMMUNITY)
Admission: EM | Admit: 2016-12-12 | Discharge: 2016-12-22 | DRG: 853 | Disposition: A | Discharge status: Other Acute Inpatient Hospital | Payer: PPO | Attending: Internal Medicine | Admitting: Internal Medicine

## 2016-12-12 ENCOUNTER — Emergency Department (HOSPITAL_COMMUNITY): Payer: PPO

## 2016-12-12 DIAGNOSIS — Z79899 Other long term (current) drug therapy: Secondary | ICD-10-CM

## 2016-12-12 DIAGNOSIS — E1151 Type 2 diabetes mellitus with diabetic peripheral angiopathy without gangrene: Secondary | ICD-10-CM | POA: Diagnosis present

## 2016-12-12 DIAGNOSIS — L89890 Pressure ulcer of other site, unstageable: Secondary | ICD-10-CM | POA: Diagnosis not present

## 2016-12-12 DIAGNOSIS — Z9911 Dependence on respirator [ventilator] status: Secondary | ICD-10-CM

## 2016-12-12 DIAGNOSIS — J151 Pneumonia due to Pseudomonas: Secondary | ICD-10-CM | POA: Diagnosis present

## 2016-12-12 DIAGNOSIS — J9690 Respiratory failure, unspecified, unspecified whether with hypoxia or hypercapnia: Secondary | ICD-10-CM

## 2016-12-12 DIAGNOSIS — R6521 Severe sepsis with septic shock: Secondary | ICD-10-CM | POA: Diagnosis not present

## 2016-12-12 DIAGNOSIS — D649 Anemia, unspecified: Secondary | ICD-10-CM | POA: Diagnosis not present

## 2016-12-12 DIAGNOSIS — A4152 Sepsis due to Pseudomonas: Principal | ICD-10-CM | POA: Diagnosis present

## 2016-12-12 DIAGNOSIS — D638 Anemia in other chronic diseases classified elsewhere: Secondary | ICD-10-CM | POA: Diagnosis present

## 2016-12-12 DIAGNOSIS — I951 Orthostatic hypotension: Secondary | ICD-10-CM | POA: Diagnosis not present

## 2016-12-12 DIAGNOSIS — Y95 Nosocomial condition: Secondary | ICD-10-CM | POA: Diagnosis present

## 2016-12-12 DIAGNOSIS — E119 Type 2 diabetes mellitus without complications: Secondary | ICD-10-CM | POA: Diagnosis not present

## 2016-12-12 DIAGNOSIS — E11621 Type 2 diabetes mellitus with foot ulcer: Secondary | ICD-10-CM | POA: Diagnosis not present

## 2016-12-12 DIAGNOSIS — R031 Nonspecific low blood-pressure reading: Secondary | ICD-10-CM | POA: Diagnosis not present

## 2016-12-12 DIAGNOSIS — I110 Hypertensive heart disease with heart failure: Secondary | ICD-10-CM | POA: Diagnosis not present

## 2016-12-12 DIAGNOSIS — Z885 Allergy status to narcotic agent status: Secondary | ICD-10-CM

## 2016-12-12 DIAGNOSIS — J9621 Acute and chronic respiratory failure with hypoxia: Secondary | ICD-10-CM

## 2016-12-12 DIAGNOSIS — J189 Pneumonia, unspecified organism: Secondary | ICD-10-CM

## 2016-12-12 DIAGNOSIS — A419 Sepsis, unspecified organism: Secondary | ICD-10-CM | POA: Diagnosis not present

## 2016-12-12 DIAGNOSIS — I5032 Chronic diastolic (congestive) heart failure: Secondary | ICD-10-CM | POA: Diagnosis present

## 2016-12-12 DIAGNOSIS — F419 Anxiety disorder, unspecified: Secondary | ICD-10-CM | POA: Diagnosis present

## 2016-12-12 DIAGNOSIS — I10 Essential (primary) hypertension: Secondary | ICD-10-CM | POA: Diagnosis present

## 2016-12-12 DIAGNOSIS — Y848 Other medical procedures as the cause of abnormal reaction of the patient, or of later complication, without mention of misadventure at the time of the procedure: Secondary | ICD-10-CM | POA: Diagnosis present

## 2016-12-12 DIAGNOSIS — N319 Neuromuscular dysfunction of bladder, unspecified: Secondary | ICD-10-CM | POA: Diagnosis not present

## 2016-12-12 DIAGNOSIS — N179 Acute kidney failure, unspecified: Secondary | ICD-10-CM | POA: Diagnosis not present

## 2016-12-12 DIAGNOSIS — Z930 Tracheostomy status: Secondary | ICD-10-CM

## 2016-12-12 DIAGNOSIS — Z01818 Encounter for other preprocedural examination: Secondary | ICD-10-CM | POA: Diagnosis not present

## 2016-12-12 DIAGNOSIS — G9340 Encephalopathy, unspecified: Secondary | ICD-10-CM | POA: Diagnosis not present

## 2016-12-12 DIAGNOSIS — G610 Guillain-Barre syndrome: Secondary | ICD-10-CM | POA: Diagnosis present

## 2016-12-12 DIAGNOSIS — E7800 Pure hypercholesterolemia, unspecified: Secondary | ICD-10-CM | POA: Diagnosis present

## 2016-12-12 DIAGNOSIS — Z91018 Allergy to other foods: Secondary | ICD-10-CM

## 2016-12-12 DIAGNOSIS — J95851 Ventilator associated pneumonia: Secondary | ICD-10-CM | POA: Diagnosis present

## 2016-12-12 DIAGNOSIS — Z931 Gastrostomy status: Secondary | ICD-10-CM

## 2016-12-12 DIAGNOSIS — Z91040 Latex allergy status: Secondary | ICD-10-CM

## 2016-12-12 DIAGNOSIS — F329 Major depressive disorder, single episode, unspecified: Secondary | ICD-10-CM | POA: Diagnosis present

## 2016-12-12 DIAGNOSIS — J9620 Acute and chronic respiratory failure, unspecified whether with hypoxia or hypercapnia: Secondary | ICD-10-CM | POA: Diagnosis not present

## 2016-12-12 DIAGNOSIS — N390 Urinary tract infection, site not specified: Secondary | ICD-10-CM | POA: Diagnosis not present

## 2016-12-12 DIAGNOSIS — R509 Fever, unspecified: Secondary | ICD-10-CM

## 2016-12-12 DIAGNOSIS — G894 Chronic pain syndrome: Secondary | ICD-10-CM | POA: Diagnosis present

## 2016-12-12 DIAGNOSIS — R1312 Dysphagia, oropharyngeal phase: Secondary | ICD-10-CM | POA: Diagnosis not present

## 2016-12-12 DIAGNOSIS — L97519 Non-pressure chronic ulcer of other part of right foot with unspecified severity: Secondary | ICD-10-CM | POA: Diagnosis present

## 2016-12-12 DIAGNOSIS — E876 Hypokalemia: Secondary | ICD-10-CM | POA: Diagnosis not present

## 2016-12-12 DIAGNOSIS — Z880 Allergy status to penicillin: Secondary | ICD-10-CM

## 2016-12-12 DIAGNOSIS — F17210 Nicotine dependence, cigarettes, uncomplicated: Secondary | ICD-10-CM | POA: Diagnosis present

## 2016-12-12 DIAGNOSIS — E43 Unspecified severe protein-calorie malnutrition: Secondary | ICD-10-CM | POA: Diagnosis not present

## 2016-12-12 DIAGNOSIS — I272 Pulmonary hypertension, unspecified: Secondary | ICD-10-CM | POA: Diagnosis not present

## 2016-12-12 DIAGNOSIS — M6281 Muscle weakness (generalized): Secondary | ICD-10-CM | POA: Diagnosis not present

## 2016-12-12 DIAGNOSIS — Z7189 Other specified counseling: Secondary | ICD-10-CM | POA: Diagnosis not present

## 2016-12-12 DIAGNOSIS — Z515 Encounter for palliative care: Secondary | ICD-10-CM | POA: Diagnosis not present

## 2016-12-12 DIAGNOSIS — J9600 Acute respiratory failure, unspecified whether with hypoxia or hypercapnia: Secondary | ICD-10-CM | POA: Diagnosis not present

## 2016-12-12 DIAGNOSIS — E039 Hypothyroidism, unspecified: Secondary | ICD-10-CM | POA: Diagnosis not present

## 2016-12-12 DIAGNOSIS — M792 Neuralgia and neuritis, unspecified: Secondary | ICD-10-CM | POA: Diagnosis present

## 2016-12-12 DIAGNOSIS — Z681 Body mass index (BMI) 19 or less, adult: Secondary | ICD-10-CM | POA: Diagnosis not present

## 2016-12-12 DIAGNOSIS — G9341 Metabolic encephalopathy: Secondary | ICD-10-CM | POA: Diagnosis present

## 2016-12-12 DIAGNOSIS — Z825 Family history of asthma and other chronic lower respiratory diseases: Secondary | ICD-10-CM

## 2016-12-12 DIAGNOSIS — I739 Peripheral vascular disease, unspecified: Secondary | ICD-10-CM | POA: Diagnosis present

## 2016-12-12 DIAGNOSIS — Z936 Other artificial openings of urinary tract status: Secondary | ICD-10-CM

## 2016-12-12 DIAGNOSIS — I950 Idiopathic hypotension: Secondary | ICD-10-CM | POA: Diagnosis not present

## 2016-12-12 DIAGNOSIS — Z452 Encounter for adjustment and management of vascular access device: Secondary | ICD-10-CM | POA: Diagnosis not present

## 2016-12-12 DIAGNOSIS — Z430 Encounter for attention to tracheostomy: Secondary | ICD-10-CM | POA: Diagnosis not present

## 2016-12-12 DIAGNOSIS — R0902 Hypoxemia: Secondary | ICD-10-CM | POA: Diagnosis not present

## 2016-12-12 DIAGNOSIS — Z431 Encounter for attention to gastrostomy: Secondary | ICD-10-CM | POA: Diagnosis not present

## 2016-12-12 DIAGNOSIS — L8990 Pressure ulcer of unspecified site, unspecified stage: Secondary | ICD-10-CM | POA: Diagnosis present

## 2016-12-12 LAB — CBC WITH DIFFERENTIAL/PLATELET
BASOS ABS: 0 10*3/uL (ref 0.0–0.1)
Basophils Relative: 0 %
Eosinophils Absolute: 0 10*3/uL (ref 0.0–0.7)
Eosinophils Relative: 0 %
HEMATOCRIT: 27.5 % — AB (ref 39.0–52.0)
Hemoglobin: 8.8 g/dL — ABNORMAL LOW (ref 13.0–17.0)
LYMPHS ABS: 0.6 10*3/uL — AB (ref 0.7–4.0)
LYMPHS PCT: 8 %
MCH: 27.9 pg (ref 26.0–34.0)
MCHC: 32 g/dL (ref 30.0–36.0)
MCV: 87.3 fL (ref 78.0–100.0)
MONO ABS: 0.6 10*3/uL (ref 0.1–1.0)
Monocytes Relative: 9 %
NEUTROS ABS: 5.8 10*3/uL (ref 1.7–7.7)
Neutrophils Relative %: 83 %
Platelets: 259 10*3/uL (ref 150–400)
RBC: 3.15 MIL/uL — AB (ref 4.22–5.81)
RDW: 19.1 % — ABNORMAL HIGH (ref 11.5–15.5)
WBC: 7 10*3/uL (ref 4.0–10.5)

## 2016-12-12 LAB — GLUCOSE, CAPILLARY
GLUCOSE-CAPILLARY: 107 mg/dL — AB (ref 65–99)
Glucose-Capillary: 116 mg/dL — ABNORMAL HIGH (ref 65–99)
Glucose-Capillary: 87 mg/dL (ref 65–99)
Glucose-Capillary: 100 mg/dL — ABNORMAL HIGH (ref 65–99)

## 2016-12-12 LAB — COMPREHENSIVE METABOLIC PANEL
ALK PHOS: 74 U/L (ref 38–126)
ALT: 37 U/L (ref 17–63)
ANION GAP: 7 (ref 5–15)
AST: 39 U/L (ref 15–41)
Albumin: 2.2 g/dL — ABNORMAL LOW (ref 3.5–5.0)
BILIRUBIN TOTAL: 0.8 mg/dL (ref 0.3–1.2)
BUN: 45 mg/dL — ABNORMAL HIGH (ref 6–20)
CHLORIDE: 106 mmol/L (ref 101–111)
CO2: 26 mmol/L (ref 22–32)
Calcium: 8.5 mg/dL — ABNORMAL LOW (ref 8.9–10.3)
Creatinine, Ser: 1.27 mg/dL — ABNORMAL HIGH (ref 0.61–1.24)
GFR calc non Af Amer: 57 mL/min — ABNORMAL LOW (ref >60)
GLUCOSE: 114 mg/dL — AB (ref 65–99)
POTASSIUM: 4.3 mmol/L (ref 3.5–5.1)
SODIUM: 139 mmol/L (ref 135–145)
TOTAL PROTEIN: 5.8 g/dL — AB (ref 6.5–8.1)

## 2016-12-12 LAB — URINALYSIS, ROUTINE W REFLEX MICROSCOPIC
BACTERIA UA: NONE SEEN
Bilirubin Urine: NEGATIVE
Glucose, UA: NEGATIVE mg/dL
KETONES UR: NEGATIVE mg/dL
LEUKOCYTES UA: NEGATIVE
Nitrite: NEGATIVE
PROTEIN: NEGATIVE mg/dL
SQUAMOUS EPITHELIAL / LPF: NONE SEEN
Specific Gravity, Urine: 1.021 (ref 1.005–1.030)
pH: 5 (ref 5.0–8.0)

## 2016-12-12 LAB — BLOOD GAS, ARTERIAL
ACID-BASE DEFICIT: 2.3 mmol/L — AB (ref 0.0–2.0)
Bicarbonate: 21.3 mmol/L (ref 20.0–28.0)
Drawn by: 362771
FIO2: 60
O2 SAT: 99.3 %
PATIENT TEMPERATURE: 98.6
PCO2 ART: 32.5 mmHg (ref 32.0–48.0)
PEEP/CPAP: 5 cmH2O
PH ART: 7.433 (ref 7.350–7.450)
RATE: 15 resp/min
VT: 650 mL
pO2, Arterial: 133 mmHg — ABNORMAL HIGH (ref 83.0–108.0)

## 2016-12-12 LAB — LACTIC ACID, PLASMA
LACTIC ACID, VENOUS: 3.4 mmol/L — AB (ref 0.5–1.9)
Lactic Acid, Venous: 3.3 mmol/L (ref 0.5–1.9)
Lactic Acid, Venous: 2 mmol/L (ref 0.5–1.9)
Lactic Acid, Venous: 1.4 mmol/L (ref 0.5–1.9)

## 2016-12-12 LAB — I-STAT ARTERIAL BLOOD GAS, ED
ACID-BASE EXCESS: 2 mmol/L (ref 0.0–2.0)
BICARBONATE: 27.1 mmol/L (ref 20.0–28.0)
O2 SAT: 84 %
PO2 ART: 51 mmHg — AB (ref 83.0–108.0)
TCO2: 29 mmol/L (ref 0–100)
pCO2 arterial: 46.7 mmHg (ref 32.0–48.0)
pH, Arterial: 7.372 (ref 7.350–7.450)

## 2016-12-12 LAB — BASIC METABOLIC PANEL
Anion gap: 9 (ref 5–15)
BUN: 41 mg/dL — ABNORMAL HIGH (ref 6–20)
CO2: 23 mmol/L (ref 22–32)
Calcium: 8 mg/dL — ABNORMAL LOW (ref 8.9–10.3)
Chloride: 107 mmol/L (ref 101–111)
Creatinine, Ser: 1.04 mg/dL (ref 0.61–1.24)
GFR calc Af Amer: >60 mL/min (ref >60)
GFR calc non Af Amer: >60 mL/min (ref >60)
Glucose, Bld: 147 mg/dL — ABNORMAL HIGH (ref 65–99)
Potassium: 4.8 mmol/L (ref 3.5–5.1)
Sodium: 139 mmol/L (ref 135–145)

## 2016-12-12 LAB — PROTIME-INR
INR: 1.46
Prothrombin Time: 17.9 seconds — ABNORMAL HIGH (ref 11.4–15.2)

## 2016-12-12 LAB — STREP PNEUMONIAE URINARY ANTIGEN: Strep Pneumo Urinary Antigen: NEGATIVE

## 2016-12-12 LAB — CBC
HCT: 27.4 % — ABNORMAL LOW (ref 39.0–52.0)
Hemoglobin: 8.6 g/dL — ABNORMAL LOW (ref 13.0–17.0)
MCH: 27.3 pg (ref 26.0–34.0)
MCHC: 31.4 g/dL (ref 30.0–36.0)
MCV: 87 fL (ref 78.0–100.0)
Platelets: 285 10*3/uL (ref 150–400)
RBC: 3.15 MIL/uL — ABNORMAL LOW (ref 4.22–5.81)
RDW: 19 % — ABNORMAL HIGH (ref 11.5–15.5)
WBC: 15.6 10*3/uL — ABNORMAL HIGH (ref 4.0–10.5)

## 2016-12-12 LAB — PROCALCITONIN: PROCALCITONIN: 52.44 ng/mL

## 2016-12-12 LAB — I-STAT CG4 LACTIC ACID, ED: Lactic Acid, Venous: 2.49 mmol/L (ref 0.5–1.9)

## 2016-12-12 LAB — APTT: aPTT: 42 seconds — ABNORMAL HIGH (ref 24–36)

## 2016-12-12 LAB — TROPONIN I

## 2016-12-12 LAB — PHOSPHORUS: Phosphorus: 3.5 mg/dL (ref 2.5–4.6)

## 2016-12-12 LAB — CORTISOL: Cortisol, Plasma: 22.6 ug/dL

## 2016-12-12 LAB — MRSA PCR SCREENING: MRSA by PCR: NEGATIVE

## 2016-12-12 LAB — MAGNESIUM: Magnesium: 1.9 mg/dL (ref 1.7–2.4)

## 2016-12-12 MED ORDER — DEXTROSE 5 % IV SOLN
2.0000 g | Freq: Three times a day (TID) | INTRAVENOUS | Status: DC
  Administered 2016-12-12 (×2): 2 g via INTRAVENOUS
  Filled 2016-12-12 (×3): qty 2

## 2016-12-12 MED ORDER — DEXTROSE 5 % IV SOLN
2.0000 g | Freq: Three times a day (TID) | INTRAVENOUS | Status: DC
  Administered 2016-12-12 – 2016-12-15 (×8): 2 g via INTRAVENOUS
  Filled 2016-12-12 (×9): qty 2

## 2016-12-12 MED ORDER — DEXTROSE 5 % IV SOLN
2.0000 g | Freq: Once | INTRAVENOUS | Status: AC
  Administered 2016-12-12: 2 g via INTRAVENOUS
  Filled 2016-12-12: qty 2

## 2016-12-12 MED ORDER — FREE WATER
100.0000 mL | Freq: Three times a day (TID) | Status: DC
  Administered 2016-12-12 – 2016-12-13 (×3): 100 mL

## 2016-12-12 MED ORDER — LEVOFLOXACIN IN D5W 750 MG/150ML IV SOLN
750.0000 mg | Freq: Once | INTRAVENOUS | Status: AC
  Administered 2016-12-12: 750 mg via INTRAVENOUS
  Filled 2016-12-12: qty 150

## 2016-12-12 MED ORDER — ALBUTEROL SULFATE (2.5 MG/3ML) 0.083% IN NEBU
2.5000 mg | INHALATION_SOLUTION | RESPIRATORY_TRACT | Status: DC | PRN
  Administered 2016-12-15 – 2016-12-16 (×4): 2.5 mg via RESPIRATORY_TRACT
  Filled 2016-12-12 (×4): qty 3

## 2016-12-12 MED ORDER — INSULIN ASPART 100 UNIT/ML ~~LOC~~ SOLN
2.0000 [IU] | SUBCUTANEOUS | Status: DC
  Administered 2016-12-12 – 2016-12-13 (×3): 2 [IU] via SUBCUTANEOUS

## 2016-12-12 MED ORDER — NOREPINEPHRINE BITARTRATE 1 MG/ML IV SOLN
5.0000 ug/min | INTRAVENOUS | Status: DC
  Administered 2016-12-12: 2 ug/min via INTRAVENOUS
  Administered 2016-12-12: 6 ug/min via INTRAVENOUS
  Administered 2016-12-12: 12 ug/min via INTRAVENOUS
  Filled 2016-12-12 (×4): qty 4

## 2016-12-12 MED ORDER — FENTANYL CITRATE (PF) 100 MCG/2ML IJ SOLN
50.0000 ug | INTRAMUSCULAR | Status: AC | PRN
  Administered 2016-12-12 (×2): 50 ug via INTRAVENOUS
  Administered 2016-12-12: 25 ug via INTRAVENOUS
  Filled 2016-12-12 (×3): qty 2

## 2016-12-12 MED ORDER — FAMOTIDINE IN NACL 20-0.9 MG/50ML-% IV SOLN
20.0000 mg | Freq: Two times a day (BID) | INTRAVENOUS | Status: DC
  Administered 2016-12-12 – 2016-12-14 (×6): 20 mg via INTRAVENOUS
  Filled 2016-12-12 (×7): qty 50

## 2016-12-12 MED ORDER — NOREPINEPHRINE BITARTRATE 1 MG/ML IV SOLN
0.0000 ug/min | Freq: Once | INTRAVENOUS | Status: AC
  Administered 2016-12-12: 15 ug/min via INTRAVENOUS
  Filled 2016-12-12: qty 4

## 2016-12-12 MED ORDER — ASPIRIN 81 MG PO CHEW: 324.0000 mg | CHEWABLE_TABLET | ORAL | Status: AC

## 2016-12-12 MED ORDER — ACETAMINOPHEN 325 MG PO TABS: 650.0000 mg | ORAL_TABLET | ORAL | Status: DC | PRN

## 2016-12-12 MED ORDER — HEPARIN SODIUM (PORCINE) 5000 UNIT/ML IJ SOLN
5000.0000 [IU] | Freq: Three times a day (TID) | INTRAMUSCULAR | Status: DC
  Administered 2016-12-12 – 2016-12-22 (×32): 5000 [IU] via SUBCUTANEOUS
  Filled 2016-12-12 (×33): qty 1

## 2016-12-12 MED ORDER — DEXTROSE 5 % IV SOLN: 2.0000 g | Freq: Once | INTRAVENOUS | Status: DC

## 2016-12-12 MED ORDER — VANCOMYCIN HCL 500 MG IV SOLR
500.0000 mg | Freq: Two times a day (BID) | INTRAVENOUS | Status: DC
  Filled 2016-12-12: qty 500

## 2016-12-12 MED ORDER — SODIUM CHLORIDE 0.9 % IV BOLUS (SEPSIS)
250.0000 mL | Freq: Once | INTRAVENOUS | Status: AC
  Administered 2016-12-12: 250 mL via INTRAVENOUS

## 2016-12-12 MED ORDER — ORAL CARE MOUTH RINSE
15.0000 mL | Freq: Four times a day (QID) | OROMUCOSAL | Status: DC
  Administered 2016-12-12 – 2016-12-22 (×41): 15 mL via OROMUCOSAL

## 2016-12-12 MED ORDER — MIDAZOLAM HCL 2 MG/2ML IJ SOLN
1.0000 mg | INTRAMUSCULAR | Status: DC | PRN
  Administered 2016-12-12: 4 mg via INTRAVENOUS
  Administered 2016-12-12 (×3): 2 mg via INTRAVENOUS
  Administered 2016-12-12 – 2016-12-13 (×3): 4 mg via INTRAVENOUS
  Administered 2016-12-13: 2 mg via INTRAVENOUS
  Administered 2016-12-13: 4 mg via INTRAVENOUS
  Administered 2016-12-14 – 2016-12-15 (×2): 2 mg via INTRAVENOUS
  Administered 2016-12-15 (×3): 4 mg via INTRAVENOUS
  Administered 2016-12-15: 2 mg via INTRAVENOUS
  Administered 2016-12-15 (×2): 4 mg via INTRAVENOUS
  Administered 2016-12-16 (×2): 2 mg via INTRAVENOUS
  Administered 2016-12-16: 4 mg via INTRAVENOUS
  Filled 2016-12-12 (×5): qty 4
  Filled 2016-12-12: qty 2
  Filled 2016-12-12 (×3): qty 4
  Filled 2016-12-12 (×4): qty 2
  Filled 2016-12-12: qty 4
  Filled 2016-12-12 (×2): qty 2
  Filled 2016-12-12 (×3): qty 4
  Filled 2016-12-12 (×2): qty 2

## 2016-12-12 MED ORDER — FENTANYL CITRATE (PF) 100 MCG/2ML IJ SOLN
25.0000 ug | INTRAMUSCULAR | Status: DC | PRN
  Administered 2016-12-12 – 2016-12-19 (×10): 25 ug via INTRAVENOUS
  Filled 2016-12-12 (×9): qty 2

## 2016-12-12 MED ORDER — HYDROCORTISONE NA SUCCINATE PF 100 MG IJ SOLR
50.0000 mg | Freq: Four times a day (QID) | INTRAMUSCULAR | Status: DC
  Administered 2016-12-12 – 2016-12-13 (×5): 50 mg via INTRAVENOUS
  Filled 2016-12-12: qty 1
  Filled 2016-12-12: qty 2
  Filled 2016-12-12 (×4): qty 1

## 2016-12-12 MED ORDER — DOCUSATE SODIUM 50 MG/5ML PO LIQD
100.0000 mg | Freq: Two times a day (BID) | ORAL | Status: DC | PRN
  Administered 2016-12-20: 100 mg
  Filled 2016-12-12: qty 10

## 2016-12-12 MED ORDER — SODIUM CHLORIDE 0.9 % IV BOLUS (SEPSIS)
Freq: Once | INTRAVENOUS | Status: AC
  Administered 2016-12-12: 02:00:00 via INTRAVENOUS

## 2016-12-12 MED ORDER — ASPIRIN 300 MG RE SUPP: 300.0000 mg | RECTAL | Status: AC

## 2016-12-12 MED ORDER — VANCOMYCIN HCL IN DEXTROSE 1-5 GM/200ML-% IV SOLN: 1000.0000 mg | Freq: Once | INTRAVENOUS | Status: DC

## 2016-12-12 MED ORDER — SODIUM CHLORIDE 0.9 % IV BOLUS (SEPSIS)
1000.0000 mL | Freq: Once | INTRAVENOUS | Status: AC
  Administered 2016-12-12: 1000 mL via INTRAVENOUS

## 2016-12-12 MED ORDER — FENTANYL 2500MCG IN NS 250ML (10MCG/ML) PREMIX INFUSION
25.0000 ug/h | INTRAVENOUS | Status: DC
  Administered 2016-12-12: 100 ug/h via INTRAVENOUS
  Administered 2016-12-13: 150 ug/h via INTRAVENOUS
  Administered 2016-12-13: 100 ug/h via INTRAVENOUS
  Filled 2016-12-12 (×3): qty 250

## 2016-12-12 MED ORDER — CHLORHEXIDINE GLUCONATE 0.12% ORAL RINSE (MEDLINE KIT)
15.0000 mL | Freq: Two times a day (BID) | OROMUCOSAL | Status: DC
  Administered 2016-12-12 – 2016-12-21 (×20): 15 mL via OROMUCOSAL

## 2016-12-12 MED ORDER — SODIUM CHLORIDE 0.9 % IV SOLN
INTRAVENOUS | Status: DC
  Administered 2016-12-12 – 2016-12-18 (×7): via INTRAVENOUS

## 2016-12-12 MED ORDER — SODIUM CHLORIDE 0.9 % IV SOLN
250.0000 mL | INTRAVENOUS | Status: DC | PRN
Start: 2016-12-12 — End: 2016-12-15

## 2016-12-12 MED ORDER — VANCOMYCIN HCL IN DEXTROSE 1-5 GM/200ML-% IV SOLN
1000.0000 mg | Freq: Once | INTRAVENOUS | Status: AC
  Administered 2016-12-12: 1000 mg via INTRAVENOUS
  Filled 2016-12-12: qty 200

## 2016-12-12 MED ORDER — ONDANSETRON HCL 4 MG/2ML IJ SOLN
4.0000 mg | Freq: Four times a day (QID) | INTRAMUSCULAR | Status: DC | PRN
  Administered 2016-12-16: 4 mg via INTRAVENOUS
  Filled 2016-12-12: qty 2

## 2016-12-12 MED ORDER — VANCOMYCIN HCL IN DEXTROSE 750-5 MG/150ML-% IV SOLN
750.0000 mg | Freq: Two times a day (BID) | INTRAVENOUS | Status: DC
  Administered 2016-12-12 – 2016-12-15 (×6): 750 mg via INTRAVENOUS
  Filled 2016-12-12 (×7): qty 150

## 2016-12-12 NOTE — Progress Notes (Signed)
PCCM Attending Re-Rounding Note:  66 y.o. 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 Known history of orthostatic hypotension on daily fludrocortisone. Sent from [facility] with fever and altered mentation. Patient was noted to have shock upon arrival and was unresponsive. Patient had foul-smelling urine. Patient has had continued intermittent agitation requiring bolus narcotics and sedatives.  Vent Mode: PRVC FiO2 (%):  [50 %-80 %] 60 % Set Rate:  [15 bmp] 15 bmp Vt Set:  [560 mL-650 mL] 650 mL PEEP:  [5 cmH20] 5 cmH20 Plateau Pressure:  [12 ION62-95 cmH20] 16 cmH20  Temp:  [98.4 F (36.9 C)-100 F (37.8 C)] 98.4 F (36.9 C) (08/12 1200) Pulse Rate:  [71-85] 85 (08/12 1138) Resp:  [14-29] 28 (08/12 1138) BP: (53-125)/(38-80) 125/80 (08/12 0700) SpO2:  [91 %-100 %] 98 % (08/12 1138) FiO2 (%):  [50 %-80 %] 60 % (08/12 1138) Weight:  [143 lb 15.4 oz (65.3 kg)-150 lb (68 kg)] 143 lb 15.4 oz (65.3 kg) (08/12 0600)  General:  Eyes closed. No acute distress. No family at bedside.  Integument:  Warm & dry. No rash on exposed skin. No bruising on exposed skin. HEENT:  No scleral injection or icterus. Tracheostomy in place. Cardiovascular:  Regular rate and rhythm. No edema. No appreciable JVD.  Pulmonary:  Coarse breath sounds. Symmetric chest wall expansion on ventilator. Abdomen: Soft. Normal bowel sounds. Nondistended.  Neurological: Spontaneously moving all 4 extremities. Seems to attend to voice. Does not follow commands.  CBC Latest Ref Rng & Units 12/12/2016 12/12/2016 06/25/2016  WBC 4.0 - 10.5 K/uL 15.6(H) 7.0 8.0  Hemoglobin 13.0 - 17.0 g/dL 8.6(L) 8.8(L) 10.5(L)  Hematocrit 39.0 - 52.0 % 27.4(L) 27.5(L) 30.8(L)  Platelets 150 - 400 K/uL 285 259 431(H)   BMP Latest Ref Rng & Units 12/12/2016 12/12/2016 06/25/2016  Glucose 65 - 99 mg/dL 147(H) 114(H) 103(H)  BUN 6 - 20 mg/dL 41(H) 45(H) 9  Creatinine 0.61 - 1.24 mg/dL 1.04  1.27(H) 0.53(L)  Sodium 135 - 145 mmol/L 139 139 135  Potassium 3.5 - 5.1 mmol/L 4.8 4.3 4.2  Chloride 101 - 111 mmol/L 107 106 104  CO2 22 - 32 mmol/L _0 Calcium 8.9 - 10.3 mg/dL 8.0(L) 8.5(L) 8.4(L)   Hepatic Function Latest Ref Rng & Units 12/12/2016 06/15/2016 06/08/2016  Total Protein 6.5 - 8.1 g/dL 5.8(L) 6.7 5.4(L)  Albumin 3.5 - 5.0 g/dL 2.2(L) 2.3(L) 2.8(L)  AST 15 - 41 U/L 39 56(H) 42(H)  ALT 17 - 63 U/L 37 53 41  Alk Phosphatase 38 - 126 U/L 74 101 59  Total Bilirubin 0.3 - 1.2 mg/dL 0.8 0.6 0.8  Bilirubin, Direct 0.0 - 0.3 mg/dL - - -   A/P:  66 y.o. male admitted with septic shock. Appears to have pneumonia. Tracheostomy culture pending. Sedation improved with Versed.  1. Septic shock: Awaiting culture results. Trending Procalcitonin. Continuing broad-spectrum antibiotics with vancomycin and aztreonam. 2. Sedation: Admitted Versed to regimen of fentanyl. 3. Possible acute encephalopathy: Suspect toxic metabolic encephalopathy. Using bolus sedation and analgesia. 4. Adrenal insufficiency: Continuing stress dosed steroids.  I have spent an additional total of 17 minutes of critical care time today caring for the patient and reviewing the patient's electronic medical record.   Sonia Baller Ashok Cordia, M.D. [facility] Pager:  [phone] After 3pm or if no response, call [phone] 1:25 PM 12/12/16

## 2016-12-12 NOTE — Progress Notes (Signed)
CRITICAL VALUE ALERT  Critical Value:  Lactic 3.3  Date & Time Notied: 12/12/2016   Provider Notified: Dr. Ashok Cordia [phone]  Orders Received/Actions taken: Awaiting orders

## 2016-12-12 NOTE — ED Triage Notes (Signed)
Pt BIB [facility] from [facility] for Fever and altered mental status. Pt is not alert to baseline, is a trach patient. Does have an open ulcer to right foot.

## 2016-12-12 NOTE — Progress Notes (Signed)
Pt transported to 20M 06 without event.  RT given report.

## 2016-12-12 NOTE — ED Provider Notes (Signed)
[facility] DEPT Provider Note   CSN: [id number] Arrival date & time: 12/12/16  0111     History   Chief Complaint Chief Complaint  Patient presents with  . Fever  . Altered Mental Status    HPI Marc Rose is a 66 y.o. male.  The history is provided by the nursing home and the EMS personnel. The history is limited by the condition of the patient (Altered mental status).  Fever    Altered Mental Status    He was transferred here from [facility] where he was noted to have altered mentation. EMS reports hypotension with blood pressure approximately 60 systolic. [facility] reports patient is normally awake and alert.  Past Medical History:  Diagnosis Date  . Anxiety state, unspecified   . Arthritis   . 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 Benign neoplasm of colon   . Causalgia of upper limb    Left limb  . Chronic back pain   . Chronic leg pain   . Depression   . Diabetes mellitus   . Diskitis 02/05/2015  . Drug rash 11/12/2015  . Elevated blood pressure reading without diagnosis of hypertension   . Essential hypertension, benign   . Hardware complicating wound infection ([place]) 02/05/2015  . Hyperpigmentation 05/15/2015  . Hypertension   . Impaired fasting glucose   . Peripheral arterial disease ([place])   . Pure hypercholesterolemia   . Tobacco use disorder   . Unspecified retinal detachment     Patient Active Problem List   Diagnosis Date Noted  . DNR (do not resuscitate) discussion   . Urinary tract infection without hematuria   . Pressure injury of skin 06/21/2016  . Palliative care encounter   . Goals of care, counseling/discussion   . 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 Malnutrition of moderate degree 06/10/2016  . Ataxic gait   . Muscle weakness (generalized)   . 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 Drug rash 11/12/2015  . Hyperpigmentation 05/15/2015  . Abdominal pain, right lower  quadrant 02/05/2015  . Abscess, psoas ([place]) 02/05/2015  . Acute upper respiratory infection 02/05/2015  . Long term current use of antibiotics 02/05/2015  . Benign essential HTN 02/05/2015  . Edema leg 02/05/2015  . Complex regional pain syndrome type 2 of upper extremity 02/05/2015  . Back pain, chronic 02/05/2015  . CN (constipation) 02/05/2015  . CD (contact dermatitis) 02/05/2015  . Elevated fasting blood sugar 02/05/2015  . LBP (low back pain) 02/05/2015  . Discitis of lumbar region 02/05/2015  . Disorder of nutrition 02/05/2015  . Feeling bilious 02/05/2015  . Flu vaccine need 02/05/2015  . Peripheral neuropathic pain 02/05/2015  . Non compliance with medical treatment 02/05/2015  . Abnormal blood chemistry 02/05/2015  . Angiopathy, peripheral ([place]) 02/05/2015  . Nerve disease, peripheral 02/05/2015  . Hypercholesterolemia without hypertriglyceridemia 02/05/2015  . Spinal stenosis 02/05/2015  . Buzzing in ear 02/05/2015  . Compulsive tobacco user syndrome 02/05/2015  . Bladder retention 02/05/2015  . Abnormal weight loss 02/05/2015  . Diskitis 02/05/2015  . Hardware complicating wound infection ([place]) 02/05/2015  . Hyperlipidemia 07/05/2014  . Peripheral arterial disease ([place]) 07/05/2014  . Hypotension 09/16/2013  . HTN (hypertension) 09/16/2013  . Anxiety 09/16/2013  . Back pain 09/16/2013    Past Surgical History:  Procedure Laterality Date  . ANKLE SURGERY     left  . APPENDECTOMY    . BACK SURGERY    . COLONOSCOPY    .  KNEE SURGERY     left       Home Medications    Prior to Admission medications   Medication Sig Start Date End Date Taking? Authorizing Provider  aspirin EC 81 MG tablet Take 81 mg by mouth daily.    [provider]  clindamycin (CLEOCIN) 600 MG/50ML IVPB Inject 50 mLs (600 mg total) into the vein every 8 (eight) hours. Until 2/27 06/25/16   [name], MD  enoxaparin (LOVENOX) 40 MG/0.4ML injection Inject 0.4 mLs (40 mg  total) into the skin daily. 06/26/16   [name], MD  fentaNYL (SUBLIMAZE) 100 MCG/2ML injection Inject 0.5-2 mLs (25-100 mcg total) into the vein every 2 (two) hours as needed for moderate pain or severe pain (sedation. RASS goal = 0.). 06/25/16   [name], MD  fentaNYL 2,500 mcg in sodium chloride 0.9 % 200 mL Inject 150 mcg/hr into the vein continuous. 06/25/16   [name], MD  fluticasone (FLONASE) 50 MCG/ACT nasal spray Place 1 spray into the nose daily as needed for allergies.  07/04/14   [provider]  haloperidol lactate (HALDOL) 5 MG/ML injection Inject 1 mL (5 mg total) into the vein every 6 (six) hours as needed. 06/25/16   [name], MD  Immune Globulin 10% (PRIVIGEN) 10 GM/100ML SOLN Inject 30 g into the vein daily. Until 2/26 06/26/16   [name], MD  midazolam 50 mg in sodium chloride 0.9 % 40 mL Inject 0.5-8 mg/hr into the vein continuous. 06/25/16   [name], MD  ondansetron (ZOFRAN) 4 MG tablet Place 1 tablet (4 mg total) into feeding tube every 6 (six) hours as needed for nausea. 06/25/16   [name], MD  pantoprazole (PROTONIX) 40 MG injection Inject 40 mg into the vein daily. 06/26/16   [name], MD  polyvinyl alcohol (LIQUIFILM TEARS) 1.4 % ophthalmic solution Place 1 drop into both eyes 3 (three) times daily. 06/25/16   [name], MD  Potassium Chloride in NaCl (0.9 % NACL WITH KCL 40 MEQ / L) 40-0.9 MEQ/L-% Inject 100 mL/hr into the vein continuous. 06/25/16   [name], MD  senna-docusate (SENOKOT-S) 8.6-50 MG tablet Place 1 tablet into feeding tube at bedtime. 06/25/16   [name], MD  tamsulosin (FLOMAX) 0.4 MG CAPS capsule Take 0.4 mg by mouth.    [provider]  thiamine 100 MG tablet Place 1 tablet (100 mg total) into feeding tube daily. 06/26/16   [name], MD    Family History Family History  Problem Relation Age of Onset  . COPD Mother   . Hyperthyroidism Mother   . Hyperthyroidism  Sister     Social History Social History  Substance Use Topics  . Smoking status: Current Every Day Smoker    Packs/day: 0.50    Years: 39.00    Types: Cigarettes  . Smokeless tobacco: Never Used  . Alcohol use No     Comment: occasionally     Allergies   Codeine; Penicillins; Latex; and Strawberry extract   Review of Systems Review of Systems  Unable to perform ROS: Mental status change  Constitutional: Positive for fever.     Physical Exam Updated Vital Signs BP (!) 55/38   Pulse 80   Temp 99.2 F (37.3 C) (Rectal)   Resp 15   SpO2 95%   Physical Exam  Nursing note and vitals reviewed.  Cachectic 66 year old male, resting comfortably and in no acute distress. Vital signs are significant for hypotension. Oxygen  saturation is 95%, which is normal. Head is normocephalic and atraumatic. PERRLA, EOMI. Oropharynx is clear. Neck is nontender and supple without adenopathy or JVD. Tracheostomy tube in place. Back is nontender and there is no CVA tenderness. Lungs are clear without rales, wheezes, or rhonchi. Chest is nontender. Heart has regular rate and rhythm without murmur. Abdomen is soft, flat, nontender without masses or hepatosplenomegaly and peristalsis is normoactive. Extremities have no cyanosis or edema, full range of motion is present. Also present on the lateral aspect of the right foot but without obvious signs of infection. Genitalia: Uncircumcised penis with Foley catheter in place. Skin is warm and dry without other rash. No sacral decubiti. Neurologic: Lethargic and minimally reactive to pain. No obvious focal neurologic deficits.  ED Treatments / Results  Labs (all labs ordered are listed, but only abnormal results are displayed) Labs Reviewed  COMPREHENSIVE METABOLIC PANEL - Abnormal; Notable for the following:       Result Value   Glucose, Bld 114 (*)    BUN 45 (*)    Creatinine, Ser 1.27 (*)    Calcium 8.5 (*)    Total Protein 5.8 (*)     Albumin 2.2 (*)    GFR calc non Af Amer 57 (*)    All other components within normal limits  CBC WITH DIFFERENTIAL/PLATELET - Abnormal; Notable for the following:    RBC 3.15 (*)    Hemoglobin 8.8 (*)    HCT 27.5 (*)    RDW 19.1 (*)    Lymphs Abs 0.6 (*)    All other components within normal limits  I-STAT CG4 LACTIC ACID, ED - Abnormal; Notable for the following:    Lactic Acid, Venous 2.49 (*)    All other components within normal limits  I-STAT ARTERIAL BLOOD GAS, ED - Abnormal; Notable for the following:    pO2, Arterial 51.0 (*)    All other components within normal limits  CULTURE, BLOOD (ROUTINE X 2)  CULTURE, BLOOD (ROUTINE X 2)  URINE CULTURE  CULTURE, RESPIRATORY (NON-EXPECTORATED)  TROPONIN I  URINALYSIS, ROUTINE W REFLEX MICROSCOPIC    EKG  EKG Interpretation  Date/Time:  Sunday December 12 2016 02:25:12 EDT Ventricular Rate:  79 PR Interval:    QRS Duration: 84 QT Interval:  372 QTC Calculation: 427 R Axis:   42 Text Interpretation:  Sinus rhythm Low voltage, extremity leads Rate slower Confirmed by Ezequiel Essex [phone]) on 12/12/2016 2:28:05 AM       Radiology Dg Chest Port 1 View  Result Date: 12/12/2016 CLINICAL DATA:  Acute onset of sepsis.  Initial encounter. EXAM: PORTABLE CHEST 1 VIEW COMPARISON:  Chest radiograph performed 06/25/2016 FINDINGS: Dense right-sided airspace opacity is compatible with pneumonia. No pleural effusion or pneumothorax is seen. The cardiomediastinal silhouette is normal in size. No acute osseous abnormalities are identified. A right PICC is noted ending about the proximal SVC. A tracheostomy tube is noted ending 4-5 cm above the carina. Thoracolumbar spinal fusion hardware is partially imaged. IMPRESSION: Dense right-sided pneumonia noted. Electronically Signed   By: Garald Balding M.D.   On: 12/12/2016 01:51    Procedures Procedures (including critical care time) CRITICAL CARE Performed by: [facility],LEXNT Total critical care  time: 90 minutes Critical care time was exclusive of separately billable procedures and treating other patients. Critical care was necessary to treat or prevent imminent or life-threatening deterioration. Critical care was time spent personally by me on the following activities: development of treatment plan with patient and/or surrogate as  well as nursing, discussions with consultants, evaluation of patient's response to treatment, examination of patient, obtaining history from patient or surrogate, ordering and performing treatments and interventions, ordering and review of laboratory studies, ordering and review of radiographic studies, pulse oximetry and re-evaluation of patient's condition.  Medications Ordered in ED Medications  levofloxacin (LEVAQUIN) IVPB 750 mg (not administered)  aztreonam (AZACTAM) 2 g in dextrose 5 % 50 mL IVPB (not administered)  vancomycin (VANCOCIN) IVPB 1000 mg/200 mL premix (not administered)  norepinephrine (LEVOPHED) 4 mg in dextrose 5 % 250 mL (0.016 mg/mL) infusion (not administered)  sodium chloride 0.9 % bolus 1,000 mL (not administered)    And  sodium chloride 0.9 % bolus 1,000 mL (not administered)    And  sodium chloride 0.9 % bolus 250 mL (not administered)  sodium chloride 0.9 % bolus ( Intravenous New Bag/Given 12/12/16 0132)     Initial Impression / Assessment and Plan / ED Course  I have reviewed the triage vital signs and the nursing notes.  Pertinent labs & imaging results that were available during my care of the patient were reviewed by me and considered in my medical decision making (see chart for details).  Hypotension with altered mentation of worrisome for sepsis. Code sepsis is activated. He is started on aggressive fluid resuscitation, norepinephrine drip for pressor support, and put on empiric antibiotics. On review of old records, he had been admitted 6 months ago with a urinary tract infection. At that admission, he was full  code.  Blood pressure has come up into the 90s with comminution of fluids and norepinephrine. Chest x-ray shows right-sided pneumonia, which is likely the source of his sepsis. However, urinalysis is still pending and he certainly could have sepsis from 2 sources. Lactic acid level is only mildly elevated at 2.49. He does show evidence of acute kidney injury with creatinine 1.27 and BUN of 45. Anemia is present which is significantly worse than baseline. This appears to be anemia of chronic disease, with possible component of iron deficiency. Case is discussed with Dr. Unknown Jim of critical care service who agrees to admit the patient.  Final Clinical Impressions(s) / ED Diagnoses   Final diagnoses:  HCAP (healthcare-associated pneumonia)  Sepsis due to undetermined organism ([place])  Acute kidney injury (nontraumatic) ([place])  Normochromic normocytic anemia    New Prescriptions New Prescriptions   No medications on file     Delora Fuel, MD 03/50/09 [phone]

## 2016-12-12 NOTE — Progress Notes (Signed)
Pharmacy Antibiotic Note  Marc Rose is a 66 y.o. male admitted on 12/12/2016 with pneumonia and sepsis.  Pharmacy has been consulted for vancomycin and aztreonam dosing.  Plan: Rec'd vanc 1g, aztreonam 2g, and levofloxacin 750mg  in ED. Vancomycin 500mg  IV every 12 hours.  Goal trough 15-20 mcg/mL.  Aztreonam 2g IV every 8 hours.  Height: 6\' 2"  (188 cm) Weight: 150 lb (68 kg) IBW/kg (Calculated) : 82.2  Temp (24hrs), Avg:99.2 F (37.3 C), Min:99.2 F (37.3 C), Max:99.2 F (37.3 C)   Recent Labs Lab 12/12/16 0130 12/12/16 0149  WBC 7.0  --   CREATININE 1.27*  --   LATICACIDVEN  --  2.49*    Estimated Creatinine Clearance: 55 mL/min (A) (by C-G formula based on SCr of 1.27 mg/dL (H)).    Allergies  Allergen Reactions  . Codeine Anaphylaxis, Hives and Swelling  . Penicillins Anaphylaxis, Hives and Swelling  . Latex Itching  . Strawberry Extract Hives    Thank you for allowing pharmacy to be a part of this patient's care.  Wynona Neat, PharmD, BCPS  12/12/2016 4:37 AM

## 2016-12-12 NOTE — Progress Notes (Signed)
Pharmacy Antibiotic Note  Marc Rose is a 66 y.o. male admitted on 12/12/2016 with pneumonia and sepsis.  Pharmacy has been consulted for vancomycin and aztreonam dosing. Hives, anaphylaxis allergy to PCN noted, but per discussion with Dr. Ashok Cordia, will change to ceftriaxone as patient tolerated this medication here in February. Afebrile. WBC up 15.6. SCr down 1.04, CrCl~65.  Plan: Increase vancomycin to 750mg  IV every 12h for improving renal function D/c aztreonam > Start ceftazidime 2g IV q8h Monitor clinical progress, c/s, renal function F/u de-escalation plan/LOT, vancomycin trough as indicated  Height: 6\' 2"  (188 cm) Weight: 143 lb 15.4 oz (65.3 kg) IBW/kg (Calculated) : 82.2  Temp (24hrs), Avg:99.2 F (37.3 C), Min:98.4 F (36.9 C), Max:100 F (37.8 C)   Recent Labs Lab 12/12/16 0130 12/12/16 0149 12/12/16 0645 12/12/16 0904  WBC 7.0  --  15.6*  --   CREATININE 1.27*  --  1.04  --   LATICACIDVEN  --  2.49* 3.3* 3.4*    Estimated Creatinine Clearance: 64.5 mL/min (by C-G formula based on SCr of 1.04 mg/dL).    Allergies  Allergen Reactions  . Codeine Anaphylaxis, Hives and Swelling  . Penicillins Anaphylaxis, Hives and Swelling  . Latex Itching  . Strawberry Extract Hives    Elicia Lamp, PharmD, BCPS Clinical Pharmacist Rx Phone # for today: [phone] After 3:30PM, please call Main Rx: [phone] 12/12/2016 2:46 PM

## 2016-12-12 NOTE — Progress Notes (Signed)
This note also relates to the following rows which could not be included: Pulse Rate - Cannot attach notes to unvalidated device data Resp - Cannot attach notes to unvalidated device data SpO2 - Cannot attach notes to unvalidated device data  Changes made to vent without RT aware.  New MD order states pt to be on 60% FiO2.  FiO2 at 60% resulted  Po2 of 51.  RT unaware of change to vent or pt status.  New ABG drawn at this time.  Results pending.

## 2016-12-12 NOTE — ED Notes (Signed)
Patient here for AMS from [facility].  Patient is a chronic trach patient and foley inserted prior to arrival.  Urine bag smelled of foul urine.  Line clamped and awaiting urine collection.  Patient has not opened eyes or vocalized any complaints.  Patient does retract to pain.  Started on levophed for hypotension of 50/40.  Given vanc, ancef, and Levaquin along with the sepsis bolus.  Awaiting bed request at this time.

## 2016-12-12 NOTE — Consult Note (Signed)
[facility] Nurse wound consult note Reason for Consult: Stable eschar on right foot, 5th digit, lateral aspect.  Non-infected, full thickness. Wound type: Suspected peripheral arterial disease. Pressure Injury POA: No Measurement: 2.8cm x 3.4cm with depth of wound undeterminable due to presence of stable eschar noted above.  Wound bed: Completely obscures, slight detachment of eschar at periphery, but not enough to atraumatically debride at this time. Drainage (amount, consistency, odor) None Periwound: Stained with betadine from previous wound care, but without warmth, induration or edema. Dressing procedure/placement/frequency: I will continue/implement a conservative POC to include daily cleansing and application of an antimicrobial astringent (betadine swabstick). Nursing will allow that to air-dry and then wrap for protection prior to placing in a pressure redistribution heel boot.  Patient crosses his legs frequently at the ankle and since this will further impede peripheral circulation, the purpose of the boots are twofold.  Thank you for inviting [place] to contribution to this gentleman's POC. [facility] nursing team will not follow, but will remain available to this patient, the nursing and medical teams.  Please re-consult if needed. Thanks, Maudie Flakes, MSN, RN, [place], Arther Abbott  Pager# [phone]

## 2016-12-12 NOTE — ED Notes (Signed)
Pt moving both legs restlessly

## 2016-12-12 NOTE — H&P (Signed)
PULMONARY / CRITICAL CARE MEDICINE   Name: Marc Rose MRN: [id number] DOB: May 01, 1951    ADMISSION DATE:  12/12/2016 CONSULTATION DATE: 12/12/16  REFERRING MD: Dr Roxanne Mins (ER)  CHIEF COMPLAINT: Septic shock  HISTORY OF PRESENT ILLNESS:   [name] with history of Guillain-Barre Syndrome (06/2016), for which he has a Tracheostomy and PEG and is on Chronic Mechanical ventilation since then. He also has h/o Anxiety, OA, Chronic pain, DM, HTN, PAD, and Orthostatic hypotension (for which he received fludrocortisone 0.2mg  daily). He was transferred today from [facility] to the ER for Fever and AMS. On arrival to the ER he was in shock with BP 50/30. He is unresponsive but unclear what his baseline mental status is. He has foul smelling urine, large unstageable ulcer on foot, and pulmonary infiltrates on CXR. In the ER he was given 30cc/kg IVF bolus, started on broad spectrum antibiotics, and Levophed. No family is present at the time of my exam. Patient withdraws to pain but does not obey commands or attempt to communicate.   PAST MEDICAL HISTORY :  He  has a past medical history of Anxiety state, unspecified; Arthritis; Ataxia (06/05/2016); Benign neoplasm of colon; Causalgia of upper limb; Chronic back pain; Chronic leg pain; Depression; Diabetes mellitus; Diskitis (02/05/2015); Drug rash (11/12/2015); Elevated blood pressure reading without diagnosis of hypertension; Essential hypertension, benign; Hardware complicating wound infection ([place]) (02/05/2015); Hyperpigmentation (05/15/2015); Hypertension; Impaired fasting glucose; Peripheral arterial disease ([place]); Pure hypercholesterolemia; Tobacco use disorder; and Unspecified retinal detachment.  PAST SURGICAL HISTORY: He  has a past surgical history that includes Ankle surgery; Knee surgery; Appendectomy; Colonoscopy; and Back surgery.  Allergies  Allergen Reactions  . Codeine Anaphylaxis, Hives and Swelling  . Penicillins Anaphylaxis, Hives and  Swelling  . Latex Itching  . Strawberry Extract Hives    No current facility-administered medications on file prior to encounter.    Current Outpatient Prescriptions on File Prior to Encounter  Medication Sig  . fluticasone (FLONASE) 50 MCG/ACT nasal spray Place 1 spray into both nostrils daily as needed for allergies.   Marland Kitchen ondansetron (ZOFRAN) 4 MG tablet Place 1 tablet (4 mg total) into feeding tube every 6 (six) hours as needed for nausea.  . pantoprazole (PROTONIX) 40 MG injection Inject 40 mg into the vein daily. (Patient taking differently: Inject 40 mg into the vein 2 (two) times daily. )  . polyvinyl alcohol (LIQUIFILM TEARS) 1.4 % ophthalmic solution Place 1 drop into both eyes 3 (three) times daily.  Marland Kitchen senna-docusate (SENOKOT-S) 8.6-50 MG tablet Place 1 tablet into feeding tube at bedtime. (Patient taking differently: Place 2 tablets into feeding tube as needed for mild constipation. )  . clindamycin (CLEOCIN) 600 MG/50ML IVPB Inject 50 mLs (600 mg total) into the vein every 8 (eight) hours. Until 2/27 (Patient not taking: Reported on 12/12/2016)  . enoxaparin (LOVENOX) 40 MG/0.4ML injection Inject 0.4 mLs (40 mg total) into the skin daily. (Patient not taking: Reported on 12/12/2016)  . fentaNYL (SUBLIMAZE) 100 MCG/2ML injection Inject 0.5-2 mLs (25-100 mcg total) into the vein every 2 (two) hours as needed for moderate pain or severe pain (sedation. RASS goal = 0.). (Patient not taking: Reported on 12/12/2016)  . fentaNYL 2,500 mcg in sodium chloride 0.9 % 200 mL Inject 150 mcg/hr into the vein continuous. (Patient not taking: Reported on 12/12/2016)  . haloperidol lactate (HALDOL) 5 MG/ML injection Inject 1 mL (5 mg total) into the vein every 6 (six) hours as needed. (Patient not taking: Reported on 12/12/2016)  .  Immune Globulin 10% (PRIVIGEN) 10 GM/100ML SOLN Inject 30 g into the vein daily. Until 2/26 (Patient not taking: Reported on 12/12/2016)  . midazolam 50 mg in sodium chloride 0.9  % 40 mL Inject 0.5-8 mg/hr into the vein continuous. (Patient not taking: Reported on 12/12/2016)  . Potassium Chloride in NaCl (0.9 % NACL WITH KCL 40 MEQ / L) 40-0.9 MEQ/L-% Inject 100 mL/hr into the vein continuous. (Patient not taking: Reported on 12/12/2016)  . thiamine 100 MG tablet Place 1 tablet (100 mg total) into feeding tube daily. (Patient not taking: Reported on 12/12/2016)   FAMILY HISTORY:  His indicated that his mother is alive. He indicated that his father is deceased. He indicated that the status of his sister is unknown.   SOCIAL HISTORY: He  reports that he has been smoking Cigarettes.  He has a 19.50 pack-year smoking history. He has never used smokeless tobacco. He reports that he uses drugs, including Marijuana. He reports that he does not drink alcohol.  REVIEW OF SYSTEMS:   Review of Systems  Unable to perform ROS: Mental status change   VITAL SIGNS: BP 97/61   Pulse 75   Temp 99.2 F (37.3 C) (Rectal)   Resp 15   Ht 6\' 2"  (1.88 m)   Wt 68 kg (150 lb)   SpO2 100%   BMI 19.26 kg/m   HEMODYNAMICS:  Levophed @ 3mcg  VENTILATOR SETTINGS: Vent Mode: PRVC FiO2 (%):  [60 %-80 %] 80 % Set Rate:  [15 bmp] 15 bmp Vt Set:  [560 mL-650 mL] 650 mL PEEP:  [5 cmH20] 5 cmH20 Plateau Pressure:  [12 cmH20-18 cmH20] 18 cmH20  INTAKE / OUTPUT: No intake/output data recorded.  PHYSICAL EXAMINATION: General: Thin elderly male, trach on vent, unresponsive, critically ill Neuro: Unresponsive, Withdraws to pain, Resists his eyelids being gently opened for pupillary exam.  HEENT: PERRL, OP clear, MM moist, 6.0 Distal XLT Shiley Trach Cardiovascular: RRR, no m/r/g Lungs: Coarse breath sounds b/l Abdomen: Soft NTND, BS+, PEG in place clean/dry with no signs of infection Musculoskeletal: no LE edema GU: Foley catheter with scant cloudy urine Skin: right 5th metatarsal with 3x4cm unstageable ulcer covered by eschar; LLE with tanned appearing hyperpigmentation on lateral  aspect of left lower leg; Ecchymosis to left forefoot  LABS:  BMET  Recent Labs Lab 12/12/16 0130  NA 139  K 4.3  CL 106  CO2 26  BUN 45*  CREATININE 1.27*  GLUCOSE 114*   Electrolytes  Recent Labs Lab 12/12/16 0130  CALCIUM 8.5*   CBC  Recent Labs Lab 12/12/16 0130  WBC 7.0  HGB 8.8*  HCT 27.5*  PLT 259   Coag's No results for input(s): APTT, INR in the last 168 hours.  Sepsis Markers  Recent Labs Lab 12/12/16 0149  LATICACIDVEN 2.49*   ABG  Recent Labs Lab 12/12/16 0223  PHART 7.372  PCO2ART 46.7  PO2ART 51.0*   Liver Enzymes  Recent Labs Lab 12/12/16 0130  AST 39  ALT 37  ALKPHOS 74  BILITOT 0.8  ALBUMIN 2.2*   Cardiac Enzymes  Recent Labs Lab 12/12/16 0130  TROPONINI <0.03   Glucose No results for input(s): GLUCAP in the last 168 hours.  Imaging Dg Chest Port 1 View  Result Date: 12/12/2016 CLINICAL DATA:  Acute onset of sepsis.  Initial encounter. EXAM: PORTABLE CHEST 1 VIEW COMPARISON:  Chest radiograph performed 06/25/2016 FINDINGS: Dense right-sided airspace opacity is compatible with pneumonia. No pleural effusion or pneumothorax is seen. The  cardiomediastinal silhouette is normal in size. No acute osseous abnormalities are identified. A right PICC is noted ending about the proximal SVC. A tracheostomy tube is noted ending 4-5 cm above the carina. Thoracolumbar spinal fusion hardware is partially imaged. IMPRESSION: Dense right-sided pneumonia noted. Electronically Signed   By: Garald Balding M.D.   On: 12/12/2016 01:51   CULTURES: Blood cultures (8/12): pending Sputum culture (8/12): pending Urine culture (8/12): ordered; waiting on patient to make urine  ANTIBIOTICS: Vancomycin, Aztreonam, Levofloxacin (8/12)  LINES/TUBES: 6.0 Distal XLT Cuffed Shiley Trach (unclear when it was placed) RUE Single Lumen PICC (from [place]) 2-18G PIV's Foley catheter (from [facility])   ASSESSMENT / PLAN: [name] with history of  Guillain-Barre Syndrome (06/2016), for which he has a Tracheostomy and PEG and is on Chronic Mechanical ventilation since then. He also has h/o Anxiety, OA, Chronic pain, DM, HTN, PAD, and Orthostatic hypotension (for which he received fludrocortisone 0.2mg  daily). He was transferred today from [facility] to the ER for Fever and AMS. On arrival to the ER he was in shock with BP 50/30. He is unresponsive but unclear what his baseline mental status is. He has foul smelling urine, large unstageable ulcer on foot, and pulmonary infiltrates on CXR. In the ER he was given 30cc/kg IVF bolus, started on broad spectrum antibiotics, and Levophed.   PULMONARY 1. Acute-on-Chronic Hypoxic respiratory failure; HCAP Pneumonia: - on chronic vent since GBS diagnosis in 06/2016. Typically is on 35% FIO2, now requiring 60% FIO2 - CXR on my review shows a large right-sided pulmonary infiltrate consistent with pneumonia - ABG drawn at 2:23am showed hypoxia with PaO2 51 and SaO2 84%; But Pox at this time was 100%. Therefore think this was a mixed sample - Repeat ABG - CXR daily - Sputum culture collected; antibiotics as below - GI and DVT prophylaxis - Nutrition consult for PEG feeds  CARDIOVASCULAR 1. Hx Orthostatic Hypotension: - on fludrocortisone 0.2mg  daily chronically; now with patient in shock, there may be some component of adrenal insufficiency; will start hydrocortisone  2. Hx HTN: - currently in shock; and MAR from [facility] lists no antihypertensives; presume this was an old diagnosis  RENAL 1. AKI; Questionable UTI  - Creatinine 1.27 up from baseline of 0.53 - urine in foley is cloudy and foul smelling; awaiting UA and Urine culture results - exchange foley for new foley catheter - continue IVF's; monitor UOP  GASTROINTESTINAL No active issues   HEMATOLOGIC 1. Anemia: - Hgb 8.8; baseline Hgb 8-10's - continue to monitor  INFECTIOUS 1. Septic shock: due to HCAP +/- UTI  - pan-cultured -  continue Vanc and Aztreonam - continue levophed; s/p 30cc/kg IVF bolus - trend lactate and procalcitonin - is chronically on fludrocortisone for orthostatic hypotension, so may have some component of adrenal insufficiency now. Check Cortisol. Empirically start Hydrocortisone.   2. Unstageable Foot Ulcer: - does not appear infected as it is cool and no surrounding erythema; however not clear how deep it is due to the large eschar covering it  - consult Wound care  ENDOCRINE 1. DM: - NPO - SSI  NEUROLOGIC 1. Acute Encephalopathy: - presumed due to the sepsis; consider head imaging if AMS doesn't improve with treatment of the sepssi - unclear what his baseline mental status is; attempted to call his family contacts at numbers listed in his chart, but there was no answer.    60 minutes critical care time  Vernie Murders, MD  Pulmonary and [place]  Pager: [phone]  12/12/2016, 4:01 AM

## 2016-12-13 ENCOUNTER — Inpatient Hospital Stay (HOSPITAL_COMMUNITY): Payer: PPO

## 2016-12-13 ENCOUNTER — Encounter (HOSPITAL_COMMUNITY): Payer: Self-pay | Admitting: *Deleted

## 2016-12-13 DIAGNOSIS — J189 Pneumonia, unspecified organism: Secondary | ICD-10-CM | POA: Diagnosis present

## 2016-12-13 DIAGNOSIS — D649 Anemia, unspecified: Secondary | ICD-10-CM | POA: Diagnosis present

## 2016-12-13 DIAGNOSIS — A419 Sepsis, unspecified organism: Secondary | ICD-10-CM | POA: Diagnosis present

## 2016-12-13 DIAGNOSIS — N179 Acute kidney failure, unspecified: Secondary | ICD-10-CM

## 2016-12-13 LAB — CBC WITH DIFFERENTIAL/PLATELET
BASOS ABS: 0 10*3/uL (ref 0.0–0.1)
Basophils Relative: 0 %
EOS PCT: 0 %
Eosinophils Absolute: 0 10*3/uL (ref 0.0–0.7)
HCT: 24.6 % — ABNORMAL LOW (ref 39.0–52.0)
Hemoglobin: 7.9 g/dL — ABNORMAL LOW (ref 13.0–17.0)
Lymphocytes Relative: 3 %
Lymphs Abs: 0.7 10*3/uL (ref 0.7–4.0)
MCH: 28 pg (ref 26.0–34.0)
MCHC: 32.1 g/dL (ref 30.0–36.0)
MCV: 87.2 fL (ref 78.0–100.0)
MONO ABS: 1 10*3/uL (ref 0.1–1.0)
Monocytes Relative: 4 %
NEUTROS PCT: 93 %
Neutro Abs: 23.2 10*3/uL — ABNORMAL HIGH (ref 1.7–7.7)
PLATELETS: 275 10*3/uL (ref 150–400)
RBC: 2.82 MIL/uL — AB (ref 4.22–5.81)
RDW: 19.4 % — AB (ref 11.5–15.5)
WBC MORPHOLOGY: INCREASED
WBC: 24.9 10*3/uL — AB (ref 4.0–10.5)

## 2016-12-13 LAB — GLUCOSE, CAPILLARY
GLUCOSE-CAPILLARY: 144 mg/dL — AB (ref 65–99)
GLUCOSE-CAPILLARY: 138 mg/dL — AB (ref 65–99)
GLUCOSE-CAPILLARY: 101 mg/dL — AB (ref 65–99)
Glucose-Capillary: 130 mg/dL — ABNORMAL HIGH (ref 65–99)
Glucose-Capillary: 100 mg/dL — ABNORMAL HIGH (ref 65–99)
Glucose-Capillary: 108 mg/dL — ABNORMAL HIGH (ref 65–99)

## 2016-12-13 LAB — LACTIC ACID, PLASMA: Lactic Acid, Venous: 1.4 mmol/L (ref 0.5–1.9)

## 2016-12-13 LAB — RENAL FUNCTION PANEL
ALBUMIN: 2.2 g/dL — AB (ref 3.5–5.0)
Anion gap: 6 (ref 5–15)
BUN: 30 mg/dL — ABNORMAL HIGH (ref 6–20)
CHLORIDE: 107 mmol/L (ref 101–111)
CO2: 25 mmol/L (ref 22–32)
CREATININE: 0.69 mg/dL (ref 0.61–1.24)
Calcium: 8.5 mg/dL — ABNORMAL LOW (ref 8.9–10.3)
Glucose, Bld: 134 mg/dL — ABNORMAL HIGH (ref 65–99)
PHOSPHORUS: 3.1 mg/dL (ref 2.5–4.6)
POTASSIUM: 4.1 mmol/L (ref 3.5–5.1)
Sodium: 138 mmol/L (ref 135–145)

## 2016-12-13 LAB — PROCALCITONIN: PROCALCITONIN: 38.2 ng/mL

## 2016-12-13 LAB — MAGNESIUM: MAGNESIUM: 1.9 mg/dL (ref 1.7–2.4)

## 2016-12-13 LAB — ABO/RH: ABO/RH(D): A POS

## 2016-12-13 LAB — TSH: TSH: 3.018 u[IU]/mL (ref 0.350–4.500)

## 2016-12-13 MED ORDER — CHLORHEXIDINE GLUCONATE CLOTH 2 % EX PADS
6.0000 | MEDICATED_PAD | Freq: Every day | CUTANEOUS | Status: DC
  Administered 2016-12-13 – 2016-12-15 (×3): 6 via TOPICAL

## 2016-12-13 MED ORDER — VITAL AF 1.2 CAL PO LIQD
1000.0000 mL | ORAL | Status: DC
  Administered 2016-12-13 – 2016-12-21 (×11): 1000 mL
  Filled 2016-12-13 (×7): qty 1000

## 2016-12-13 MED ORDER — FLUDROCORTISONE ACETATE 0.1 MG PO TABS
0.2000 mg | ORAL_TABLET | Freq: Every day | ORAL | Status: DC
  Administered 2016-12-13 – 2016-12-22 (×10): 0.2 mg
  Filled 2016-12-13 (×11): qty 2

## 2016-12-13 MED ORDER — LEVOTHYROXINE SODIUM 100 MCG IV SOLR
75.0000 ug | Freq: Every day | INTRAVENOUS | Status: DC
  Administered 2016-12-13 – 2016-12-15 (×3): 75 ug via INTRAVENOUS
  Filled 2016-12-13 (×3): qty 5

## 2016-12-13 MED ORDER — QUETIAPINE FUMARATE 25 MG PO TABS
25.0000 mg | ORAL_TABLET | Freq: Two times a day (BID) | ORAL | Status: DC
  Administered 2016-12-13: 25 mg
  Filled 2016-12-13 (×2): qty 1

## 2016-12-13 MED ORDER — PRO-STAT SUGAR FREE PO LIQD: 30.0000 mL | Freq: Two times a day (BID) | ORAL | Status: DC

## 2016-12-13 MED ORDER — QUETIAPINE FUMARATE 25 MG PO TABS
25.0000 mg | ORAL_TABLET | Freq: Two times a day (BID) | ORAL | Status: DC
  Filled 2016-12-13: qty 1

## 2016-12-13 NOTE — Clinical Social Work Note (Addendum)
Clinical Social Work Assessment  Patient Details  Name: Marc Rose MRN: [id number] Date of Birth: June 02, 1950  Date of referral:  12/13/16               Reason for consult:  Facility Placement (pt is from [place]. )                Permission sought to share information with:  Family Supports Permission granted to share information::  Yes, Verbal Permission Granted  Name::     Marc Rose (Sister) and Marc Rose (Friend)   Agency::     Relationship::     Contact Information:     Housing/Transportation Living arrangements for the past 2 months:  [place] ([place]) Source of Information:  Other (Comment Required) (sister and friend. ) Patient Interpreter Needed:  None Criminal Activity/Legal Involvement Pertinent to Current Situation/Hospitalization:  No - Comment as needed Significant Relationships:  Friend, Siblings Lives with:  Facility Resident Do you feel safe going back to the place where you live?  Yes Need for family participation in patient care:  Yes (Comment)  Care giving concerns:  CSW went to speak with pt at bedside. Pt is currently on vent and is trach due to medical condition. CSW spoke with pt's sister Marc Rose) and was then refereed to speak with pt's friend Marc Rose). Both have been involved with pt's care, but Marc Rose has more lately and has become concerned about pt's care and needs at this time.     Social Worker assessment / plan:  CSW spoke with both Marc Rose via phone. CSW was informed that pt lived alone in the past until wife passed away. After the death of pt's wife, pt lived and has been living at [place]. CSW was notified that pt had been at [facility] since February 2018. Pt has support from Marc Rose and she may be pt's only support. Marc Rose informed CSW that she wasn't sure if pt would be returning to [place] once medically ready for discharge. CSW was also informed that Marc Rose is working to gather more infomration on pt needs as she states that she has  papers to prove that she is pt's legal guardian. Marc Rose informed CSW that the state is over pt's finances and Ms. Marc Rose is pt's state attorney as well. Marc Rose mentioned that she would like pt to be placed in [facility] in [place] for long term care if needed.   Employment status:  Retired Forensic scientist:  Managed Care PT Recommendations:  Not assessed at this time Information / Referral to community resources:  [place]  Patient/Family's Response to care:  Marc Rose is understanding and agreeable to pts care. Marc Rose recommend that anyone needing information should contact Marc Rose as [place] lives in [place].   Patient/Family's Understanding of and Emotional Response to Diagnosis, Current Treatment, and Prognosis:  No further questions or concerns have been presented at this time.   Emotional Assessment Appearance:    Attitude/Demeanor/Rapport:  Unable to Assess Affect (typically observed):  Unable to Assess Orientation:    Alcohol / Substance use:  Not Applicable Psych involvement (Current and /or in the community):  No (Comment)  Discharge Needs  Concerns to be addressed:  No discharge needs identified Readmission within the last 30 days:  No Current discharge risk:  None Barriers to Discharge:  No Barriers Identified   Wetzel Bjornstad, [place] 12/13/2016, 11:04 AM

## 2016-12-13 NOTE — H&P (Signed)
PULMONARY / CRITICAL CARE MEDICINE   Name: Marc Rose MRN: [id number] DOB: [birth year]    ADMISSION DATE:  12/12/2016 CONSULTATION DATE: 12/12/16  REFERRING MD: Dr Roxanne Mins (ER)  CHIEF COMPLAINT: Septic shock  HISTORY OF PRESENT ILLNESS:   [name] with history of Guillain-Barre Syndrome (06/2016), for which he has a Tracheostomy and PEG and is on Chronic Mechanical ventilation since then, currently at [facility]. He also has h/o Anxiety, OA, Chronic pain, DM, HTN, PAD, and Orthostatic hypotension (for which he receives fludrocortisone 0.2mg  daily). He was transferred today from [facility] to the ER for Fever and AMS. On arrival to the ER he was in shock with BP 50/30. He is unresponsive but unclear what his baseline mental status is. He has foul smelling urine, large unstageable ulcer on foot, and pulmonary infiltrates on CXR. In the ER he was given 30cc/kg IVF bolus, started on broad spectrum antibiotics, and Levophed. No family is present at the time of my exam. Patient withdraws to pain but does not obey commands or attempt to communicate.    ROS   SUBJECTIVE:   No acute events. Vasopressors off since this AM.  VITAL SIGNS: BP (!) 101/57   Pulse 63   Temp 98.3 F (36.8 C) (Oral)   Resp 15   Ht 6\' 2"  (1.88 m)   Wt 65.9 kg (145 lb 4.5 oz)   SpO2 100%   BMI 18.65 kg/m   HEMODYNAMICS:  Levophed @ 26mcg  VENTILATOR SETTINGS: Vent Mode: PRVC FiO2 (%):  [40 %-60 %] 40 % Set Rate:  [15 bmp] 15 bmp Vt Set:  [650 mL] 650 mL PEEP:  [5 cmH20] 5 cmH20 Plateau Pressure:  [15 cmH20-22 cmH20] 22 cmH20  INTAKE / OUTPUT: I/O last 3 completed shifts: In: 7799.2 [P.O.:2345; I.V.:4104.2; NG/GT:100; IV Piggyback:1250] Out: 6256 [Urine:1515]  PHYSICAL EXAMINATION: General: Thin elderly male, chronically ill appearing, trach on vent, opens eyes follows basic commands. Neuro:  Opens eyes, follows basic commands. HEENT: PERRL, OP clear, MM moist, 6.0 Distal XLT Shiley  Trach. Cardiovascular: RRR, no m/r/g. Lungs: Coarse breath sounds b/l. Abdomen: Soft NTND, BS+, PEG in place clean/dry with no signs of infection. Musculoskeletal: no LE edema. GU: Foley catheter with scant cloudy urine. Skin: b/l feet / ankles in pressure relief boots  LABS:  BMET  Recent Labs Lab 12/12/16 0130 12/12/16 0645 12/13/16 0550  NA 139 139 138  K 4.3 4.8 4.1  CL 106 107 107  CO2 26 23 25   BUN 45* 41* 30*  CREATININE 1.27* 1.04 0.69  GLUCOSE 114* 147* 134*   Electrolytes  Recent Labs Lab 12/12/16 0130 12/12/16 0645 12/13/16 0550  CALCIUM 8.5* 8.0* 8.5*  MG  --  1.9 1.9  PHOS  --  3.5 3.1   CBC  Recent Labs Lab 12/12/16 0130 12/12/16 0645 12/13/16 0550  WBC 7.0 15.6* 24.9*  HGB 8.8* 8.6* 7.9*  HCT 27.5* 27.4* 24.6*  PLT 259 285 275   Coag's  Recent Labs Lab 12/12/16 0645  APTT 42*  INR 1.46    Sepsis Markers  Recent Labs Lab 12/12/16 0645  12/12/16 1438 12/12/16 2031 12/13/16 0051 12/13/16 0550  LATICACIDVEN 3.3*  < > 2.0* 1.4 1.4  --   PROCALCITON 52.44  --   --   --   --  38.20  < > = values in this interval not displayed. ABG  Recent Labs Lab 12/12/16 0223 12/12/16 0515  PHART 7.372 7.433  PCO2ART 46.7 32.5  PO2ART 51.0*  133*   Liver Enzymes  Recent Labs Lab 12/12/16 0130 12/13/16 0550  AST 39  --   ALT 37  --   ALKPHOS 74  --   BILITOT 0.8  --   ALBUMIN 2.2* 2.2*   Cardiac Enzymes  Recent Labs Lab 12/12/16 0130  TROPONINI <0.03   Glucose  Recent Labs Lab 12/12/16 0550 12/12/16 0809 12/12/16 1233 12/12/16 1621 12/12/16 2028 12/13/16 0748  GLUCAP 144* 116* 87 100* 107* 138*    Imaging Dg Chest Port 1 View  Result Date: 12/13/2016 CLINICAL DATA:  Pneumonia. EXAM: PORTABLE CHEST 1 VIEW COMPARISON:  One-view chest x-ray 12/12/2016. FINDINGS: A tracheostomy tube is in place. Persistent right lower lobe pneumonia is present. A right pleural effusion is noted. The left lung remains clear. Heart  size is normal. Mild gaseous distention of the colon is noted. IMPRESSION: 1. Right lower lobe pneumonia. 2. Tracheostomy tube is stable. 3. Mild gaseous distention of bowel. Electronically Signed   By: San Morelle M.D.   On: 12/13/2016 07:06   CULTURES: Blood cultures (8/12): > Sputum culture (8/12): > Urine culture (8/12): >   ANTIBIOTICS: Vancomycin 8/12 > Aztreonam 8/12 > 8/12 Ceftazidime 8/12 >   LINES/TUBES: 6.0 Distal XLT Cuffed Shiley Trach (unclear when it was placed) RUE Single Lumen PICC (from [place]) Foley catheter (from [place])   ASSESSMENT / PLAN: [name] with history of Guillain-Barre Syndrome (06/2016), for which he has a Tracheostomy and PEG and is on Chronic Mechanical ventilation since then. He also has h/o Anxiety, OA, Chronic pain, DM, HTN, PAD, and Orthostatic hypotension (for which he received fludrocortisone 0.2mg  daily). He was transferred today from [facility] to the ER for Fever and AMS. On arrival to the ER he was in shock with BP 50/30. He is unresponsive but unclear what his baseline mental status is. He has foul smelling urine, large unstageable ulcer on foot, and pulmonary infiltrates on CXR. In the ER he was given 30cc/kg IVF bolus, started on broad spectrum antibiotics, and Levophed.   PULMONARY 1. Acute-on-Chronic Hypoxic respiratory failure - on chronic vent since GBS diagnosis in 06/2016. Typically is on 35% FIO2, now requiring 40% FIO2. Continue full vent support, wean FiO2 as able. Bronchial hygiene. CXR daily.  2.  HCAP. Continue empiric abx (vanc / ceftaz). Follow cultures.  CARDIOVASCULAR 1. Hx Orthostatic Hypotension: on fludrocortisone 0.2mg  daily chronically. D/c stress steroids (cortisol 22). Continue fludrocortisone. Goal SBP > 65.  2. Hx HTN:  from [facility] lists no antihypertensives; presume this was an old diagnosis Continue to monitor hemodynamics.  RENAL 1. AKI - resolved 8/13. Continue IVF's; monitor UOP  D/c  free water.  GASTROINTESTINAL 1. Nutrition Continue TF's.  HEMATOLOGIC 1. Anemia - Hgb 8.8; baseline Hgb 8-10's Transfuse for Hgb < 7.  INFECTIOUS 1. Septic shock: due to HCAP +/- UTI  Continue empiric abx (vanc / ceftaz) Trend lactate and procalcitonin.   2. Unstageable Foot Ulcer - no signs of infxn Continue dressing changes and pressure relief boots.  ENDOCRINE 1. DM: Continue SSI.  2. Hx hypothyroidism. Continue synthroid. Assess TSH.  NEUROLOGIC 1. Acute Encephalopathy: presumed due to sepsis; improving AM 8/13. Continue supportive care.   CC time: 30 min.   Montey Hora, [place] Pulmonary & Critical Care Medicine Pager: [phone]  or [phone] 12/13/2016, 11:09 AM   STAFF NOTE: I, Merrie Roof, MD FACP have personally reviewed patient's available data, including medical history, events of note, physical examination  and test results as part of my evaluation. I have discussed with resident/NP and other care providers such as pharmacist, RN and RRT. In addition, I personally evaluated patient and elicited key findings of: awake, fc simple per PA, ronchi on examination chest, left greater rt, abdo soft, peg wnl, edema none, pcxr which I reviewed shows slitgh better aeration in diffuse dense infiltrate rt, this is VAP from [facility], remains culture neg abd no ARDS, he vent dep at baseline, PCT noted, re assess in am , likely in am to narrow to CTX if remains culture neg, kep plate less 30 as able, attempt PS 20-22 weaning as able, levo to map goal 55-60 with good urine output, pcxr to follow The patient is critically ill with multiple organ systems failure and requires high complexity decision making for assessment and support, frequent evaluation and titration of therapies, application of advanced monitoring technologies and extensive interpretation of multiple databases.   Critical Care Time devoted to patient care services described in this note  is 30 Minutes. This time reflects time of care of this signee: Merrie Roof, MD FACP. This critical care time does not reflect procedure time, or teaching time or supervisory time of PA/NP/Med student/Med Resident etc but could involve care discussion time. Rest per NP/medical resident whose note is outlined above and that I agree with   [name]. Titus Mould, MD, [place] Pgr: [place] [facility]monary & Critical [facility] 12/13/2016 12:34 PM

## 2016-12-13 NOTE — Progress Notes (Signed)
CSW spoke with [name] regarding potential barriers to placement at [facility] for pt. CSW confirmed with [name] that pt can go back to [facility] once ready for discharge.   Virgie Dad Dartanyon Frankowski, MSW, [place] [facility] Clinical Social Worker [phone]

## 2016-12-13 NOTE — Progress Notes (Signed)
eLink Physician-Brief Progress Note Patient Name: Marc Rose DOB: Sep 12, 1950 MRN: [id number]   Date of Service  12/13/2016  HPI/Events of Note  Agitation - QTc interval = 0.43 seconds.   eICU Interventions  Will order: 1. Seroquel 25 mg per tube BID.     Intervention Category Minor Interventions: Agitation / anxiety - evaluation and management  Lysle Dingwall 12/13/2016, 8:31 PM

## 2016-12-13 NOTE — Progress Notes (Signed)
eLink Physician-Brief Progress Note Patient Name: Marc Rose DOB: 1951-04-01 MRN: [id number]   Date of Service  12/13/2016  HPI/Events of Note  Agitation - Request for bilateral soft wrist and ankle restraints.   eICU Interventions  Will order bilateral soft wrist and ankle restraints.      Intervention Category Minor Interventions: Agitation / anxiety - evaluation and management  Sommer,Steven Eugene 12/13/2016, 7:17 PM

## 2016-12-13 NOTE — Progress Notes (Signed)
CSW was notified by [name] that she would like for pt to go to [facility] in [place] if possible once medically stable for discharge.    Virgie Dad Jinnifer Montejano, MSW, [place] [facility] Department Clinical Social Worker [phone]

## 2016-12-13 NOTE — Progress Notes (Addendum)
Initial Nutrition Assessment  DOCUMENTATION CODES:   Severe malnutrition in context of chronic illness  INTERVENTION:    Vital AF 1.2 at 60 ml/h (1440 ml per day)  Provides 1728 kcal, 108 gm protein, 1168 ml free water daily  Monitor magnesium, potassium, and phosphorus daily for at least 3 days, MD to replete as needed, as pt is at risk for refeeding syndrome given severe PCM.  NUTRITION DIAGNOSIS:   Malnutrition (severe) related to chronic illness (Guillain Barre with trach/vent) as evidenced by severe depletion of body fat, severe depletion of muscle mass.  GOAL:   Patient will meet greater than or equal to 90% of their needs  MONITOR:   Vent status, TF tolerance, Labs, Skin, I & O's  REASON FOR ASSESSMENT:   Consult Enteral/tube feeding initiation and management  ASSESSMENT:   66 yo male with PMH of depression, HTN, HLD, DM, arthritis, PAD, and Guillain Barre syndrome, with trach (chronic vent), and PEG who was admitted on 8/12 with fever and AMS related to shock.  Discussed patient in ICU rounds and with RN today. Received MD Consult for TF initiation and management. Nutrition-Focused physical exam completed. Findings are severe fat depletion, severe muscle depletion, and no edema.  17% weight loss within the past year Patient is severely malnourished. Patient is currently intubated on ventilator support MV: 10.3 L/min Temp (24hrs), Avg:98.6 F (37 C), Min:98.3 F (36.8 C), Max:99.1 F (37.3 C)  Labs reviewed. CBG's: 138 Medications reviewed.  Diet Order:  Diet NPO time specified  Skin:  Wound (see comment) (unstageable R foot; stage I sacrum)  Last BM:  8/13  Height:   Ht Readings from Last 1 Encounters:  12/12/16 6\' 2"  (1.88 m)    Weight:   Wt Readings from Last 1 Encounters:  12/13/16 145 lb 4.5 oz (65.9 kg)    Ideal Body Weight:  86.4 kg  BMI:  Body mass index is 18.65 kg/m.  Estimated Nutritional Needs:   Kcal:  6767  Protein:   100-115 gm  Fluid:  1.8-2 L  EDUCATION NEEDS:   No education needs identified at this time  Molli Barrows, [place], [place], [place] Pager [phone] After Hours Pager [phone]

## 2016-12-13 NOTE — Progress Notes (Signed)
Wasted 225cc of fentanyl down the sink with carla fulk. Modena Morrow E, RN 12/13/2016 1:55 PM

## 2016-12-14 ENCOUNTER — Inpatient Hospital Stay (HOSPITAL_COMMUNITY): Payer: PPO

## 2016-12-14 DIAGNOSIS — E43 Unspecified severe protein-calorie malnutrition: Secondary | ICD-10-CM | POA: Diagnosis present

## 2016-12-14 LAB — CBC WITH DIFFERENTIAL/PLATELET
BASOS PCT: 0 %
Basophils Absolute: 0 10*3/uL (ref 0.0–0.1)
EOS ABS: 0 10*3/uL (ref 0.0–0.7)
EOS PCT: 0 %
HCT: 23.3 % — ABNORMAL LOW (ref 39.0–52.0)
Hemoglobin: 7.4 g/dL — ABNORMAL LOW (ref 13.0–17.0)
Lymphocytes Relative: 6 %
Lymphs Abs: 1 10*3/uL (ref 0.7–4.0)
MCH: 27.4 pg (ref 26.0–34.0)
MCHC: 31.8 g/dL (ref 30.0–36.0)
MCV: 86.3 fL (ref 78.0–100.0)
MONO ABS: 0.9 10*3/uL (ref 0.1–1.0)
MONOS PCT: 5 %
Neutro Abs: 14.2 10*3/uL — ABNORMAL HIGH (ref 1.7–7.7)
Neutrophils Relative %: 89 %
Platelets: 262 10*3/uL (ref 150–400)
RBC: 2.7 MIL/uL — ABNORMAL LOW (ref 4.22–5.81)
RDW: 19.1 % — AB (ref 11.5–15.5)
WBC: 16.1 10*3/uL — ABNORMAL HIGH (ref 4.0–10.5)

## 2016-12-14 LAB — BASIC METABOLIC PANEL
Anion gap: 5 (ref 5–15)
BUN: 29 mg/dL — AB (ref 6–20)
CALCIUM: 8.5 mg/dL — AB (ref 8.9–10.3)
CO2: 25 mmol/L (ref 22–32)
CREATININE: 0.62 mg/dL (ref 0.61–1.24)
Chloride: 111 mmol/L (ref 101–111)
GFR calc non Af Amer: >60 mL/min (ref >60)
Glucose, Bld: 121 mg/dL — ABNORMAL HIGH (ref 65–99)
Potassium: 3.3 mmol/L — ABNORMAL LOW (ref 3.5–5.1)
SODIUM: 141 mmol/L (ref 135–145)

## 2016-12-14 LAB — URINE CULTURE: CULTURE: NO GROWTH

## 2016-12-14 LAB — MAGNESIUM: MAGNESIUM: 2 mg/dL (ref 1.7–2.4)

## 2016-12-14 LAB — PROCALCITONIN: Procalcitonin: 26.44 ng/mL

## 2016-12-14 LAB — GLUCOSE, CAPILLARY
GLUCOSE-CAPILLARY: 102 mg/dL — AB (ref 65–99)
GLUCOSE-CAPILLARY: 97 mg/dL (ref 65–99)
GLUCOSE-CAPILLARY: 100 mg/dL — AB (ref 65–99)
Glucose-Capillary: 117 mg/dL — ABNORMAL HIGH (ref 65–99)
Glucose-Capillary: 113 mg/dL — ABNORMAL HIGH (ref 65–99)

## 2016-12-14 LAB — PHOSPHORUS: PHOSPHORUS: 2.4 mg/dL — AB (ref 2.5–4.6)

## 2016-12-14 LAB — LEGIONELLA PNEUMOPHILA SEROGP 1 UR AG: L. PNEUMOPHILA SEROGP 1 UR AG: NEGATIVE

## 2016-12-14 MED ORDER — VITAMIN B-1 100 MG PO TABS
100.0000 mg | ORAL_TABLET | Freq: Every day | ORAL | Status: DC
  Administered 2016-12-14 – 2016-12-22 (×9): 100 mg
  Filled 2016-12-14 (×9): qty 1

## 2016-12-14 MED ORDER — SODIUM CHLORIDE 0.9% FLUSH: 10.0000 mL | INTRAVENOUS | Status: DC | PRN

## 2016-12-14 MED ORDER — CHLORHEXIDINE GLUCONATE CLOTH 2 % EX PADS
6.0000 | MEDICATED_PAD | Freq: Every day | CUTANEOUS | Status: DC
Start: 2016-12-14 — End: 2016-12-14

## 2016-12-14 MED ORDER — CLONAZEPAM 0.5 MG PO TABS
0.5000 mg | ORAL_TABLET | Freq: Every day | ORAL | Status: DC
  Administered 2016-12-14 – 2016-12-22 (×9): 0.5 mg
  Filled 2016-12-14 (×9): qty 1

## 2016-12-14 MED ORDER — DIPHENHYDRAMINE HCL 25 MG PO CAPS
25.0000 mg | ORAL_CAPSULE | Freq: Four times a day (QID) | ORAL | Status: DC | PRN
  Filled 2016-12-14: qty 1

## 2016-12-14 MED ORDER — POTASSIUM CHLORIDE 20 MEQ PO PACK
40.0000 meq | PACK | ORAL | Status: AC
  Administered 2016-12-14 (×2): 40 meq via ORAL
  Filled 2016-12-14 (×2): qty 2

## 2016-12-14 MED ORDER — QUETIAPINE FUMARATE 50 MG PO TABS
50.0000 mg | ORAL_TABLET | Freq: Two times a day (BID) | ORAL | Status: DC
  Administered 2016-12-14 (×2): 50 mg
  Filled 2016-12-14 (×4): qty 1

## 2016-12-14 MED ORDER — HALOPERIDOL LACTATE 5 MG/ML IJ SOLN
5.0000 mg | Freq: Four times a day (QID) | INTRAMUSCULAR | Status: DC | PRN
  Administered 2016-12-14 – 2016-12-15 (×2): 5 mg via INTRAVENOUS
  Filled 2016-12-14 (×3): qty 1

## 2016-12-14 MED ORDER — SERTRALINE HCL 50 MG PO TABS
50.0000 mg | ORAL_TABLET | Freq: Every day | ORAL | Status: DC
  Administered 2016-12-14 – 2016-12-22 (×9): 50 mg
  Filled 2016-12-14 (×9): qty 1

## 2016-12-14 MED ORDER — DIPHENHYDRAMINE HCL 25 MG PO CAPS
25.0000 mg | ORAL_CAPSULE | Freq: Two times a day (BID) | ORAL | Status: DC
  Administered 2016-12-14: 25 mg via ORAL
  Filled 2016-12-14 (×3): qty 1

## 2016-12-14 MED ORDER — THIAMINE HCL 100 MG PO TABS: 100.0000 mg | ORAL_TABLET | Freq: Every day | ORAL | Status: DC

## 2016-12-14 NOTE — Progress Notes (Signed)
Wasted 100cc of fentanyl in the sink with Heide Guile. Modena Morrow E, RN 12/14/2016 1:12 PM

## 2016-12-14 NOTE — Progress Notes (Signed)
PROGRESS NOTE    Marc GLOSS  [id number]:[id number] DOB: 06-16-1950 DOA: 12/12/2016 PCP: Patient, [name]    Brief Narrative:  66 year old male who presented with fever and altered mental status. Patient is known to have Guillain-Barr syndrome, chronic respiratory failure status post tracheostomy and PEG tube. Chronic dependence on mechanical ventilation. He was transferred from [facility] due to fever and altered mentation, on his initial physical examination he was hypotensive, blood pressure 50/30, he was unresponsive, heart rate 63, temperature 98.3, respirations 15, oxygen saturation 100%, on invasive mechanical ventilation per tracheostomy. His lungs had coarse breath sounds bilaterally, heart S1-S2 present and rhythmic, his abdomen was soft nontender, PEG tube was in place. No lower extremity edema. Sodium 139, potassium 4.3, chloride 106, bicarbonate 26, glucose 114, BUN 45, creatinine 1,27, white count 7.0, hemoglobin 8,8, hematocrit 27,5, platelets 259. Lactic acid 2.4, arterial blood gas 7.37, PCO2 46.7, PA 02 51.0, bicarbonate 27.1, oxygen saturation 94%. EKG was normal sinus rhythm chest x-ray with right upper lobe infiltrate.  Patient was admitted to the intensive care unit with acute on chronic hypoxic respiratory failure due to ventilator associated pneumonia/ healthcare associated pneumonia, present on admission, complicated by septic shock and acute kidney injury.    Assessment & Plan:   Active Problems:   Septic shock ([place])   HCAP (healthcare-associated pneumonia)   Sepsis due to undetermined organism (HCC)   Normochromic normocytic anemia  1. Septic shock due to healthcare associated pneumonia, ventilator associated pneumonia, present on admission. Will continue with antibiotic therapy with vancomycin and ceftazidime, chest film personally reviewed noted right upper lobe infiltrate. Will follow a restrictive IV fluids strategy to prevent volume overload.   2. Acute  on chronic hypoxic respiratory failure. Aspiration precautions. As needed bronchodilator therapy. Continue mechanical ventilation with PRVC, peep of 5 with Fi02 40%. Rate 15. TV 650, will follow on pulmonary recommendations, may benefit from lower tidal volumes.   3. Acute kidney injury with hypokalemia. Renal function has recovered to serum cr at 0.62, will continue serum K repletion with kcl per PEG, 80 meq in 2 divided doses. Follow renal panel in am, patient tolerating well tube feedings.    4. Anemia due to chronic disease. Hb down to 7,4 from 7,9, will continue close follow up, may need prbc transfusion if continue to drop hb, specially below 7.   5. Type 2 diabetes mellitus. Will continue tube feedings, capillary glucose 102, 97, 117, 113. Will continue sliding scale for glucose cover and monitoring.   6. Unstageable foot ulcer, present on admission. Local wound care, no signs of local infection.    7. Hypothyroid. Continue levothyroxine.     DVT prophylaxis: enoxaparin  Code Status: full  Family Communication:  Disposition Plan:    Consultants:     Procedures:    Antimicrobials:    Subjective: Patient more responsive, opens eyes to touch, not interactive. Has been agitated last night, required physical restrains, home medications have been resumed with improvement of mentation.   Objective: Vitals:   12/14/16 0830 12/14/16 0900 12/14/16 0941 12/14/16 1000  BP: 107/72 103/83 (!) 101/57 (!) 97/55  Pulse: 80 85 77 78  Resp: _0 Temp:      TempSrc:      SpO2: 95% 95% 95% 97%  Weight:      Height:        Intake/Output Summary (Last 24 hours) at 12/14/16 1026 Last data filed at 12/14/16 1000  Gross per 24 hour  Intake          4261.41 ml  Output             1035 ml  Net          3226.41 ml   Filed Weights   12/12/16 0600 12/13/16 0430 12/14/16 0441  Weight: 65.3 kg (143 lb 15.4 oz) 65.9 kg (145 lb 4.5 oz) 66.4 kg (146 lb 6.2 oz)     Examination:  General exam: deconditioned, ill looking appearing E ENT. Mild pallor, no icterus, oral mucosa dry.   Respiratory system: mild rales at bases, no wheezing, no rhonchi. Respiratory effort normal. Cardiovascular system: S1 & S2 heard, RRR. No JVD, murmurs, rubs, gallops or clicks. No pedal edema. Gastrointestinal system: Abdomen is nondistended, soft and nontender. No organomegaly or masses felt. Normal bowel sounds heard. Central nervous system: somnolent, opens eyes to touch.  Extremities: Symmetric 5 x 5 power. Skin: right foot with large ulcerated wound at the lateral aspect, base of the 5th metatarsal stage 3 to 4 with necrotic base.      Data Reviewed: I have personally reviewed following labs and imaging studies  CBC:  Recent Labs Lab 12/12/16 0130 12/12/16 0645 12/13/16 0550 12/14/16 0556  WBC 7.0 15.6* 24.9* 16.1*  NEUTROABS 5.8  --  23.2* 14.2*  HGB 8.8* 8.6* 7.9* 7.4*  HCT 27.5* 27.4* 24.6* 23.3*  MCV 87.3 87.0 87.2 86.3  PLT 259 285 275 751   Basic Metabolic Panel:  Recent Labs Lab 12/12/16 0130 12/12/16 0645 12/13/16 0550 12/14/16 0556  NA 139 139 138 141  K 4.3 4.8 4.1 3.3*  CL 106 107 107 111  CO2 _0 GLUCOSE 114* 147* 134* 121*  BUN 45* 41* 30* 29*  CREATININE 1.27* 1.04 0.69 0.62  CALCIUM 8.5* 8.0* 8.5* 8.5*  MG  --  1.9 1.9 2.0  PHOS  --  3.5 3.1 2.4*   GFR: Estimated Creatinine Clearance: 85.3 mL/min (by C-G formula based on SCr of 0.62 mg/dL). Liver Function Tests:  Recent Labs Lab 12/12/16 0130 12/13/16 0550  AST 39  --   ALT 37  --   ALKPHOS 74  --   BILITOT 0.8  --   PROT 5.8*  --   ALBUMIN 2.2* 2.2*   No results for input(s): LIPASE, AMYLASE in the last 168 hours. No results for input(s): AMMONIA in the last 168 hours. Coagulation Profile:  Recent Labs Lab 12/12/16 0645  INR 1.46   Cardiac Enzymes:  Recent Labs Lab 12/12/16 0130  TROPONINI <0.03   BNP (last 3 results) No results for  input(s): PROBNP in the last 8760 hours. HbA1C: No results for input(s): HGBA1C in the last 72 hours. CBG:  Recent Labs Lab 12/13/16 1622 12/13/16 1945 12/13/16 2327 12/14/16 0316 12/14/16 0814  GLUCAP 130* 100* 108* 102* 97   Lipid Profile: No results for input(s): CHOL, HDL, LDLCALC, TRIG, CHOLHDL, LDLDIRECT in the last 72 hours. Thyroid Function Tests:  Recent Labs  12/13/16 1201  TSH 3.018   Anemia Panel: No results for input(s): VITAMINB12, FOLATE, FERRITIN, TIBC, IRON, RETICCTPCT in the last 72 hours. Sepsis Labs:  Recent Labs Lab 12/12/16 0645 12/12/16 0904 12/12/16 1438 12/12/16 2031 12/13/16 0051 12/13/16 0550 12/14/16 0556  PROCALCITON 52.44  --   --   --   --  38.20 26.44  LATICACIDVEN 3.3* 3.4* 2.0* 1.4 1.4  --   --     Recent Results (from the past 240 hour(s))  Culture, respiratory (NON-Expectorated)     Status: None (Preliminary result)   Collection Time: 12/12/16  1:28 AM  Result Value Ref Range Status   Specimen Description TRACHEAL ASPIRATE  Final   Special Requests NONE  Final   Gram Stain   Final    ABUNDANT WBC PRESENT, PREDOMINANTLY PMN RARE SQUAMOUS EPITHELIAL CELLS PRESENT MODERATE GRAM POSITIVE COCCI IN PAIRS FEW GRAM VARIABLE ROD    Culture CULTURE REINCUBATED FOR BETTER GROWTH  Final   Report Status PENDING  Incomplete  Blood Culture (routine x 2)     Status: None (Preliminary result)   Collection Time: 12/12/16  1:30 AM  Result Value Ref Range Status   Specimen Description BLOOD LEFT ANTECUBITAL  Final   Special Requests   Final    BOTTLES DRAWN AEROBIC AND ANAEROBIC Blood Culture adequate volume   Culture NO GROWTH 1 DAY  Final   Report Status PENDING  Incomplete  Blood Culture (routine x 2)     Status: None (Preliminary result)   Collection Time: 12/12/16  1:59 AM  Result Value Ref Range Status   Specimen Description BLOOD LEFT WRIST  Final   Special Requests   Final    BOTTLES DRAWN AEROBIC AND ANAEROBIC Blood Culture  adequate volume   Culture NO GROWTH 1 DAY  Final   Report Status PENDING  Incomplete  Culture, blood (single)     Status: None (Preliminary result)   Collection Time: 12/12/16  6:23 AM  Result Value Ref Range Status   Specimen Description BLOOD LEFT ARM  Final   Special Requests IN PEDIATRIC BOTTLE Blood Culture adequate volume  Final   Culture NO GROWTH 1 DAY  Final   Report Status PENDING  Incomplete  MRSA PCR Screening     Status: None   Collection Time: 12/12/16  7:03 AM  Result Value Ref Range Status   MRSA by PCR NEGATIVE NEGATIVE Final    Comment:        The GeneXpert MRSA Assay (FDA approved for NASAL specimens only), is one component of a comprehensive MRSA colonization surveillance program. It is not intended to diagnose MRSA infection nor to guide or monitor treatment for MRSA infections.   Urine culture     Status: None   Collection Time: 12/12/16 10:54 PM  Result Value Ref Range Status   Specimen Description URINE, RANDOM  Final   Special Requests NONE  Final   Culture NO GROWTH  Final   Report Status 12/14/2016 FINAL  Final         Radiology Studies: Dg Chest Port 1 View  Result Date: 12/14/2016 CLINICAL DATA:  Respiratory failure. EXAM: PORTABLE CHEST 1 VIEW COMPARISON:  12/14/2010.  12/13/2010.  06/25/2016.  CT 06/06/2016. FINDINGS: PICC line tip noted over the right subclavian vein. Tracheostomy tube in stable position. Stable mediastinal prominence consistent previously identified small mediastinal lymph nodes. Diffuse right lung infiltrate. Small right pleural effusion. No pneumothorax. Prior thoracolumbar spine fusion. IMPRESSION: 1. Right PICC line tip noted over the right subclavian vein. Tracheostomy tube in stable position. Electronically Signed   By: Marcello Moores  Register   On: 12/14/2016 07:27   Dg Chest Port 1 View  Result Date: 12/13/2016 CLINICAL DATA:  Pneumonia. EXAM: PORTABLE CHEST 1 VIEW COMPARISON:  One-view chest x-ray 12/12/2016. FINDINGS: A  tracheostomy tube is in place. Persistent right lower lobe pneumonia is present. A right pleural effusion is noted. The left lung remains clear. Heart size is normal. Mild gaseous distention of the colon  is noted. IMPRESSION: 1. Right lower lobe pneumonia. 2. Tracheostomy tube is stable. 3. Mild gaseous distention of bowel. Electronically Signed   By: San Morelle M.D.   On: 12/13/2016 07:06        Scheduled Meds: . chlorhexidine gluconate (MEDLINE KIT)  15 mL Mouth Rinse BID  . Chlorhexidine Gluconate Cloth  6 each Topical Q0600  . clonazePAM  0.5 mg Per Tube Daily  . diphenhydrAMINE  25 mg Oral BID  . fludrocortisone  0.2 mg Per Tube Daily  . heparin  5,000 Units Subcutaneous Q8H  . insulin aspart  2-6 Units Subcutaneous Q4H  . levothyroxine  75 mcg Intravenous QAC breakfast  . mouth rinse  15 mL Mouth Rinse QID  . QUEtiapine  50 mg Per Tube BID  . sertraline  50 mg Per Tube Daily  . thiamine  100 mg Per Tube Daily   Continuous Infusions: . sodium chloride    . sodium chloride 100 mL/hr at 12/14/16 1000  . cefTAZidime (FORTAZ)  IV Stopped (12/14/16 0610)  . famotidine (PEPCID) IV Stopped (12/14/16 0932)  . feeding supplement (VITAL AF 1.2 CAL) 1,000 mL (12/14/16 1000)  . vancomycin Stopped (12/14/16 0500)     LOS: 2 days        Tycho Cheramie Gerome Apley, MD [facility] Pager [phone]  If 7PM-7AM, please contact night-coverage [email or web address] Password [facility] 12/14/2016, 10:26 AM

## 2016-12-15 ENCOUNTER — Inpatient Hospital Stay (HOSPITAL_COMMUNITY): Payer: PPO

## 2016-12-15 DIAGNOSIS — J9600 Acute respiratory failure, unspecified whether with hypoxia or hypercapnia: Secondary | ICD-10-CM

## 2016-12-15 LAB — GLUCOSE, CAPILLARY
GLUCOSE-CAPILLARY: 104 mg/dL — AB (ref 65–99)
GLUCOSE-CAPILLARY: 104 mg/dL — AB (ref 65–99)
GLUCOSE-CAPILLARY: 105 mg/dL — AB (ref 65–99)
GLUCOSE-CAPILLARY: 109 mg/dL — AB (ref 65–99)
GLUCOSE-CAPILLARY: 118 mg/dL — AB (ref 65–99)
Glucose-Capillary: 104 mg/dL — ABNORMAL HIGH (ref 65–99)
Glucose-Capillary: 121 mg/dL — ABNORMAL HIGH (ref 65–99)

## 2016-12-15 LAB — CBC WITH DIFFERENTIAL/PLATELET
BASOS ABS: 0 10*3/uL (ref 0.0–0.1)
Basophils Relative: 0 %
Eosinophils Absolute: 0 10*3/uL (ref 0.0–0.7)
Eosinophils Relative: 0 %
HCT: 20.3 % — ABNORMAL LOW (ref 39.0–52.0)
HEMOGLOBIN: 6.6 g/dL — AB (ref 13.0–17.0)
LYMPHS ABS: 0.8 10*3/uL (ref 0.7–4.0)
LYMPHS PCT: 14 %
MCH: 28.1 pg (ref 26.0–34.0)
MCHC: 32.5 g/dL (ref 30.0–36.0)
MCV: 86.4 fL (ref 78.0–100.0)
Monocytes Absolute: 0.6 10*3/uL (ref 0.1–1.0)
Monocytes Relative: 10 %
NEUTROS PCT: 76 %
Neutro Abs: 4.6 10*3/uL (ref 1.7–7.7)
Platelets: 277 10*3/uL (ref 150–400)
RBC: 2.35 MIL/uL — ABNORMAL LOW (ref 4.22–5.81)
RDW: 19.3 % — ABNORMAL HIGH (ref 11.5–15.5)
WBC: 6 10*3/uL (ref 4.0–10.5)

## 2016-12-15 LAB — BASIC METABOLIC PANEL
ANION GAP: 8 (ref 5–15)
BUN: 20 mg/dL (ref 6–20)
CHLORIDE: 109 mmol/L (ref 101–111)
CO2: 25 mmol/L (ref 22–32)
Calcium: 8.4 mg/dL — ABNORMAL LOW (ref 8.9–10.3)
Creatinine, Ser: 0.56 mg/dL — ABNORMAL LOW (ref 0.61–1.24)
GFR calc non Af Amer: >60 mL/min (ref >60)
Glucose, Bld: 131 mg/dL — ABNORMAL HIGH (ref 65–99)
POTASSIUM: 2.9 mmol/L — AB (ref 3.5–5.1)
SODIUM: 142 mmol/L (ref 135–145)

## 2016-12-15 LAB — HEMOGLOBIN AND HEMATOCRIT, BLOOD
HEMATOCRIT: 24.5 % — AB (ref 39.0–52.0)
HEMOGLOBIN: 7.9 g/dL — AB (ref 13.0–17.0)

## 2016-12-15 LAB — PREPARE RBC (CROSSMATCH)

## 2016-12-15 LAB — CULTURE, RESPIRATORY

## 2016-12-15 LAB — CULTURE, RESPIRATORY W GRAM STAIN

## 2016-12-15 MED ORDER — POTASSIUM CHLORIDE 20 MEQ PO PACK
40.0000 meq | PACK | ORAL | Status: DC
  Filled 2016-12-15 (×2): qty 2

## 2016-12-15 MED ORDER — LEVOTHYROXINE SODIUM 75 MCG PO TABS
75.0000 ug | ORAL_TABLET | Freq: Every day | ORAL | Status: DC
  Administered 2016-12-16 – 2016-12-22 (×7): 75 ug
  Filled 2016-12-15 (×7): qty 1

## 2016-12-15 MED ORDER — LEVOFLOXACIN IN D5W 750 MG/150ML IV SOLN
750.0000 mg | INTRAVENOUS | Status: DC
  Administered 2016-12-15 – 2016-12-22 (×8): 750 mg via INTRAVENOUS
  Filled 2016-12-15 (×8): qty 150

## 2016-12-15 MED ORDER — ACETYLCYSTEINE 20 % IN SOLN
4.0000 mL | Freq: Two times a day (BID) | RESPIRATORY_TRACT | Status: AC
  Administered 2016-12-15 – 2016-12-17 (×4): 4 mL via RESPIRATORY_TRACT
  Filled 2016-12-15 (×3): qty 4

## 2016-12-15 MED ORDER — SODIUM CHLORIDE 0.9 % IV SOLN
Freq: Once | INTRAVENOUS | Status: AC
  Administered 2016-12-15: 11:00:00 via INTRAVENOUS

## 2016-12-15 MED ORDER — FAMOTIDINE 40 MG/5ML PO SUSR
20.0000 mg | Freq: Two times a day (BID) | ORAL | Status: DC
  Administered 2016-12-15 – 2016-12-22 (×15): 20 mg
  Filled 2016-12-15 (×15): qty 2.5

## 2016-12-15 MED ORDER — DIPHENHYDRAMINE HCL 25 MG PO CAPS: 25.0000 mg | ORAL_CAPSULE | Freq: Four times a day (QID) | ORAL | Status: DC | PRN

## 2016-12-15 MED ORDER — INSULIN ASPART 100 UNIT/ML ~~LOC~~ SOLN
0.0000 [IU] | SUBCUTANEOUS | Status: DC
  Administered 2016-12-15 – 2016-12-16 (×3): 1 [IU] via SUBCUTANEOUS
  Administered 2016-12-16: 2 [IU] via SUBCUTANEOUS
  Administered 2016-12-17 (×3): 1 [IU] via SUBCUTANEOUS
  Administered 2016-12-19: 2 [IU] via SUBCUTANEOUS
  Administered 2016-12-21 – 2016-12-22 (×3): 1 [IU] via SUBCUTANEOUS

## 2016-12-15 MED ORDER — POTASSIUM CHLORIDE 20 MEQ/15ML (10%) PO SOLN: 40.0000 meq | Freq: Once | ORAL | Status: DC

## 2016-12-15 MED ORDER — ACETYLCYSTEINE 20 % IN SOLN
4.0000 mL | Freq: Two times a day (BID) | RESPIRATORY_TRACT | Status: DC
  Administered 2016-12-15: 4 mL via RESPIRATORY_TRACT
  Filled 2016-12-15 (×2): qty 4

## 2016-12-15 MED ORDER — POTASSIUM CHLORIDE 20 MEQ PO PACK
40.0000 meq | PACK | ORAL | Status: AC
  Administered 2016-12-15 (×3): 40 meq via ORAL
  Filled 2016-12-15 (×3): qty 2

## 2016-12-15 MED ORDER — QUETIAPINE FUMARATE 100 MG PO TABS
100.0000 mg | ORAL_TABLET | Freq: Every day | ORAL | Status: DC
  Administered 2016-12-15 – 2016-12-21 (×7): 100 mg
  Filled 2016-12-15 (×7): qty 1

## 2016-12-15 MED ORDER — QUETIAPINE FUMARATE 50 MG PO TABS
50.0000 mg | ORAL_TABLET | Freq: Every day | ORAL | Status: DC
  Administered 2016-12-15 – 2016-12-22 (×8): 50 mg
  Filled 2016-12-15 (×8): qty 1

## 2016-12-15 MED ORDER — DIPHENHYDRAMINE HCL 25 MG PO CAPS
25.0000 mg | ORAL_CAPSULE | Freq: Two times a day (BID) | ORAL | Status: DC
  Administered 2016-12-15 – 2016-12-22 (×14): 25 mg
  Filled 2016-12-15 (×14): qty 1

## 2016-12-15 MED ORDER — ACETAMINOPHEN 325 MG PO TABS
650.0000 mg | ORAL_TABLET | ORAL | Status: DC | PRN
  Administered 2016-12-17 – 2016-12-20 (×6): 650 mg
  Filled 2016-12-15 (×6): qty 2

## 2016-12-15 MED ORDER — ETOMIDATE 2 MG/ML IV SOLN
20.0000 mg | Freq: Once | INTRAVENOUS | Status: AC
  Administered 2016-12-15: 20 mg via INTRAVENOUS

## 2016-12-15 NOTE — Progress Notes (Signed)
Patient seen for trach team follow up.  No education provided at this time, as patient is vent dependent and no family is available.  Patient has chronic trach, and has been vent dependent since 06/2016.  All needed equipment is at the bedside.  Will continue to follow for progression.

## 2016-12-15 NOTE — Progress Notes (Signed)
Marc Rose - Stepdown/ICU TEAM  Marc Rose  YSA:[id number] DOB: [birth year] DOA: 12/12/2016 PCP: Patient, [name]    Brief Narrative:  66yo M with history of Guillain-Barre Syndrome (06/2016) for which he has a Tracheostomy and PEG and is on Chronic Mechanical ventilation and is living at [facility]. He also has a h/o Anxiety, OA, Chronic pain, DM, HTN, PAD, and Orthostatic hypotension. He was transferred from [facility] to the ER on 8/12 w/ Fever and AMS. On arrival to the ER he was in shock with BP 50/30. He had foul smelling urine, a large unstageable ulcer on his foot, and pulmonary infiltrates on CXR.  Significant Events: 8/12 admit to [facility] ICU 8/15 [facility] assumed care   Subjective: The patient is noncommunicative.  He opens his eyes when I speak to him or touch him for the exam.  He does not follow simple commands.  He does not appear to be uncomfortable.  He is in no apparent acute respiratory distress.  There is no family present at the time of visit.  Assessment & Plan:  Septic shock due to HCAP Continue empiric abx - shock resolved - now off pressors   Acute-on-Chronic Hypoxic respiratory failure chronic vent since GBS 06/2016 - typically on 35% FIO2 - ongoing vent care per PCCM - heavy secretions proving to be a challenge   Anemia  baseline Hgb 8-10 - no clear blood loss - ?simply dilutional - transfuse 1U PRBC today and follow trend   Recent Labs Lab 12/12/16 0130 12/12/16 0645 12/13/16 0550 12/14/16 0556 12/15/16 0558  HGB 8.8* 8.6* 7.9* 7.4* 6.6*    Acute Encephalopathy presumed due to sepsis - follow mental status   Hx Orthostatic Hypotension on fludrocortisone chronically  AKI resolved 8/13  Hypokalemia  Supplement and follow - magnesium normal  Nutrition PEG tube dependent - continue TF  Unstageable Foot Ulcer  Continue dressing changes and pressure relief boots  DM CBG well controlled at this time  Hx  hypothyroidism Continue synthroid - TSH not at goal, but pt acutely ill - will not change dose for now - recheck TSH when clinically at baseline   DVT prophylaxis: lovenox  Code Status: FULL CODE Family Communication: no family present at time of exam  Disposition Plan: vent capable SDU   Consultants:  PCCM  Antimicrobials:  Vancomycin 8/12 > Aztreonam 8/11 > 8/12 Ceftazidime 8/12 >  Objective: Blood pressure 92/64, pulse 85, temperature 99.9 F (37.7 C), temperature source Oral, resp. rate (!) 21, height 6' 2" (Rose.88 m), weight 69.7 kg (153 lb 10.6 oz), SpO2 100 %.  Intake/Output Summary (Last 24 hours) at 12/15/16 0838 Last data filed at 12/15/16 0716  Gross per 24 hour  Intake             3450 ml  Output             1235 ml  Net             2215 ml   Filed Weights   12/13/16 0430 12/14/16 0441 12/15/16 0500  Weight: 65.9 kg (145 lb 4.5 oz) 66.4 kg (146 lb 6.2 oz) 69.7 kg (153 lb 10.6 oz)    Examination: General: No acute respiratory distress on vent via trach  Lungs: course upper airway sounds th/o all fields - no wheezing  Cardiovascular: Regular rate and rhythm without murmur  Abdomen: tender to palpation diffusely, nondistended, soft, bowel sounds hypoactive, no rebound, no ascites, no appreciable mass Extremities:  No significant cyanosis, clubbing, or edema bilateral lower extremities  CBC:  Recent Labs Lab 12/12/16 0130 12/12/16 0645 12/13/16 0550 12/14/16 0556 12/15/16 0558  WBC 7.0 15.6* 24.9* 16.Rose* 6.0  NEUTROABS 5.8  --  23.2* 14.2* 4.6  HGB 8.8* 8.6* 7.9* 7.4* 6.6*  HCT 27.5* 27.4* 24.6* 23.3* 20.3*  MCV 87.3 87.0 87.2 86.3 86.4  PLT 259 285 275 262 277   Basic Metabolic Panel:  Recent Labs Lab 12/12/16 0130 12/12/16 0645 12/13/16 0550 12/14/16 0556 12/15/16 0558  NA 139 139 138 141 142  K 4.3 4.8 4.Rose 3.3* 2.9*  CL 106 107 107 111 109  CO2 26 23 25 25 25  GLUCOSE 114* 147* 134* 121* 131*  BUN 45* 41* 30* 29* 20  CREATININE Rose.27* Rose.04  0.69 0.62 0.56*  CALCIUM 8.5* 8.0* 8.5* 8.5* 8.4*  MG  --  Rose.9 Rose.9 2.0  --   PHOS  --  3.5 3.Rose 2.4*  --    GFR: Estimated Creatinine Clearance: 89.5 mL/min (A) (by C-G formula based on SCr of 0.56 mg/dL (L)).  Liver Function Tests:  Recent Labs Lab 12/12/16 0130 12/13/16 0550  AST 39  --   ALT 37  --   ALKPHOS 74  --   BILITOT 0.8  --   PROT 5.8*  --   ALBUMIN 2.2* 2.2*    Coagulation Profile:  Recent Labs Lab 12/12/16 0645  INR Rose.46    Cardiac Enzymes:  Recent Labs Lab 12/12/16 0130  TROPONINI <0.03    HbA1C: Hgb A1c MFr Bld  Date/Time Value Ref Range Status  06/19/2016 05:00 AM 5.7 (H) 4.8 - 5.6 % Final    Comment:    (NOTE)         Pre-diabetes: 5.7 - 6.4         Diabetes: >6.4         Glycemic control for adults with diabetes: <7.0   09/17/2013 06:34 PM 6.5 (H) <5.7 % Final    Comment:    (NOTE)                                                                       According to the ADA Clinical Practice Recommendations for 2011, when HbA1c is used as a screening test:  >=6.5%   Diagnostic of Diabetes Mellitus           (if abnormal result is confirmed) 5.7-6.4%   Increased risk of developing Diabetes Mellitus References:Diagnosis and Classification of Diabetes Mellitus,Diabetes Care,2011,34(Suppl Rose):S62-S69 and Standards of Medical Care in         Diabetes - 2011,Diabetes Care,2011,34 (Suppl Rose):S11-S61.    CBG:  Recent Labs Lab 12/14/16 1554 12/14/16 2011 12/15/16 0008 12/15/16 0407 12/15/16 0737  GLUCAP 113* 100* 118* 104* 105*    Recent Results (from the past 240 hour(s))  Culture, respiratory (NON-Expectorated)     Status: None (Preliminary result)   Collection Time: 12/12/16  Rose:28 AM  Result Value Ref Range Status   Specimen Description TRACHEAL ASPIRATE  Final   Special Requests NONE  Final   Gram Stain   Final    ABUNDANT WBC PRESENT, PREDOMINANTLY PMN RARE SQUAMOUS EPITHELIAL CELLS PRESENT MODERATE GRAM POSITIVE COCCI IN  PAIRS FEW GRAM   VARIABLE ROD    Culture   Final    MODERATE PSEUDOMONAS AERUGINOSA MODERATE GROUP B STREP(S.AGALACTIAE)ISOLATED    Report Status PENDING  Incomplete  Blood Culture (routine x 2)     Status: None (Preliminary result)   Collection Time: 12/12/16  Rose:30 AM  Result Value Ref Range Status   Specimen Description BLOOD LEFT ANTECUBITAL  Final   Special Requests   Final    BOTTLES DRAWN AEROBIC AND ANAEROBIC Blood Culture adequate volume   Culture NO GROWTH 2 DAYS  Final   Report Status PENDING  Incomplete  Blood Culture (routine x 2)     Status: None (Preliminary result)   Collection Time: 12/12/16  Rose:59 AM  Result Value Ref Range Status   Specimen Description BLOOD LEFT WRIST  Final   Special Requests   Final    BOTTLES DRAWN AEROBIC AND ANAEROBIC Blood Culture adequate volume   Culture NO GROWTH 2 DAYS  Final   Report Status PENDING  Incomplete  Culture, blood (single)     Status: None (Preliminary result)   Collection Time: 12/12/16  6:23 AM  Result Value Ref Range Status   Specimen Description BLOOD LEFT ARM  Final   Special Requests IN PEDIATRIC BOTTLE Blood Culture adequate volume  Final   Culture NO GROWTH 2 DAYS  Final   Report Status PENDING  Incomplete  MRSA PCR Screening     Status: None   Collection Time: 12/12/16  7:03 AM  Result Value Ref Range Status   MRSA by PCR NEGATIVE NEGATIVE Final    Comment:        The GeneXpert MRSA Assay (FDA approved for NASAL specimens only), is one component of a comprehensive MRSA colonization surveillance program. It is not intended to diagnose MRSA infection nor to guide or monitor treatment for MRSA infections.   Urine culture     Status: None   Collection Time: 12/12/16 10:54 PM  Result Value Ref Range Status   Specimen Description URINE, RANDOM  Final   Special Requests NONE  Final   Culture NO GROWTH  Final   Report Status 12/14/2016 FINAL  Final     Scheduled Meds: . chlorhexidine gluconate (MEDLINE  KIT)  15 mL Mouth Rinse BID  . Chlorhexidine Gluconate Cloth  6 each Topical Q0600  . clonazePAM  0.5 mg Per Tube Daily  . diphenhydrAMINE  25 mg Oral BID  . fludrocortisone  0.2 mg Per Tube Daily  . heparin  5,000 Units Subcutaneous Q8H  . insulin aspart  2-6 Units Subcutaneous Q4H  . levothyroxine  75 mcg Intravenous QAC breakfast  . mouth rinse  15 mL Mouth Rinse QID  . QUEtiapine  50 mg Per Tube BID  . sertraline  50 mg Per Tube Daily  . thiamine  100 mg Per Tube Daily     LOS: 3 days    T. , MD [facility] Office  [phone] Pager - Text Page per Amion as per below:  On-Call/Text Page:      [email or web address]      password [email or web address]  If 7PM-7AM, please contact night-coverage [email or web address] Password [email or web address] 12/15/2016, 8:38 AM     

## 2016-12-15 NOTE — H&P (Signed)
PULMONARY / CRITICAL CARE MEDICINE   Name: ADEMOLA VERT MRN: [id number] DOB: 16-Jun-1950    ADMISSION DATE:  12/12/2016 CONSULTATION DATE: 12/12/16  REFERRING MD: Dr Roxanne Mins (ER)  CHIEF COMPLAINT: Septic shock  HISTORY OF PRESENT ILLNESS:   [name] with history of Guillain-Barre Syndrome (06/2016), for which he has a Tracheostomy and PEG and is on Chronic Mechanical ventilation since then, currently at [facility]. He also has h/o Anxiety, OA, Chronic pain, DM, HTN, PAD, and Orthostatic hypotension (for which he receives fludrocortisone 0.2mg  daily). He was transferred today from [facility] to the ER for Fever and AMS. On arrival to the ER he was in shock with BP 50/30. He is unresponsive but unclear what his baseline mental status is. He has foul smelling urine, large unstageable ulcer on foot, and pulmonary infiltrates on CXR. In the ER he was given 30cc/kg IVF bolus, started on broad spectrum antibiotics, and Levophed. No family is present at the time of my exam. Patient withdraws to pain but does not obey commands or attempt to communicate.    ROS   SUBJECTIV: thick secretions with desaturation  VITAL SIGNS: BP (!) 143/85   Pulse 72   Temp 99.9 F (37.7 C) (Oral)   Resp 15   Ht 6\' 2"  (1.88 m)   Wt 69.7 kg (153 lb 10.6 oz)   SpO2 100%   BMI 19.73 kg/m   HEMODYNAMICS:  Levophed @ 50mcg  VENTILATOR SETTINGS: Vent Mode: PRVC FiO2 (%):  [40 %] 40 % Set Rate:  [15 bmp] 15 bmp Vt Set:  [650 mL] 650 mL PEEP:  [5 cmH20] 5 cmH20 Plateau Pressure:  [19 cmH20-30 cmH20] 27 cmH20  INTAKE / OUTPUT: I/O last 3 completed shifts: In: 5958.8 [I.V.:2783.4; Other:320; NG/GT:1905.3; IV Piggyback:950] Out: 1517 [OHYWV:3710]  PHYSICAL EXAMINATION: General: awakens, not fc Neuro: not fc, moves ext HEENT: trach clean PULM: ronchi worsening diffuse CV: s1 s2 RRR no  GI: soft, bs wnl, no r Extremities: no edema   LABS:  BMET  Recent Labs Lab 12/13/16 0550 12/14/16 0556  12/15/16 0558  NA 138 141 142  K 4.1 3.3* 2.9*  CL 107 111 109  CO2 25 25 25   BUN 30* 29* 20  CREATININE 0.69 0.62 0.56*  GLUCOSE 134* 121* 131*   Electrolytes  Recent Labs Lab 12/12/16 0645 12/13/16 0550 12/14/16 0556 12/15/16 0558  CALCIUM 8.0* 8.5* 8.5* 8.4*  MG 1.9 1.9 2.0  --   PHOS 3.5 3.1 2.4*  --    CBC  Recent Labs Lab 12/13/16 0550 12/14/16 0556 12/15/16 0558  WBC 24.9* 16.1* 6.0  HGB 7.9* 7.4* 6.6*  HCT 24.6* 23.3* 20.3*  PLT 275 262 277   Coag's  Recent Labs Lab 12/12/16 0645  APTT 42*  INR 1.46    Sepsis Markers  Recent Labs Lab 12/12/16 0645  12/12/16 1438 12/12/16 2031 12/13/16 0051 12/13/16 0550 12/14/16 0556  LATICACIDVEN 3.3*  < > 2.0* 1.4 1.4  --   --   PROCALCITON 52.44  --   --   --   --  38.20 26.44  < > = values in this interval not displayed. ABG  Recent Labs Lab 12/12/16 0223 12/12/16 0515  PHART 7.372 7.433  PCO2ART 46.7 32.5  PO2ART 51.0* 133*   Liver Enzymes  Recent Labs Lab 12/12/16 0130 12/13/16 0550  AST 39  --   ALT 37  --   ALKPHOS 74  --   BILITOT 0.8  --   ALBUMIN  2.2* 2.2*   Cardiac Enzymes  Recent Labs Lab 12/12/16 0130  TROPONINI <0.03   Glucose  Recent Labs Lab 12/14/16 1129 12/14/16 1554 12/14/16 2011 12/15/16 0008 12/15/16 0407 12/15/16 0737  GLUCAP 117* 113* 100* 118* 104* 105*    Imaging No results found. CULTURES: Blood cultures (8/12): > Sputum culture (8/12): >pseudo, agalactai Urine culture (8/12): >   ANTIBIOTICS: Vancomycin 8/12 >>>8/15 Aztreonam 8/12 > 8/12 Ceftazidime 8/12 >>>   LINES/TUBES: 6.0 Distal XLT Cuffed Shiley Trach (unclear when it was placed) RUE Single Lumen PICC (from [place]) Foley catheter (from [place])   ASSESSMENT / PLAN: [name] with history of Guillain-Barre Syndrome (06/2016), for which he has a Tracheostomy and PEG and is on Chronic Mechanical ventilation since then. He also has h/o Anxiety, OA, Chronic pain, DM, HTN, PAD, and  Orthostatic hypotension (for which he received fludrocortisone 0.2mg  daily). He was transferred today from [facility] to the ER for Fever and AMS. On arrival to the ER he was in shock with BP 50/30. He is unresponsive but unclear what his baseline mental status is. He has foul smelling urine, large unstageable ulcer on foot, and pulmonary infiltrates on CXR. In the ER he was given 30cc/kg IVF bolus, started on broad spectrum antibiotics, and Levophed.   PULMONARY 1. Acute-on-Chronic Hypoxic respiratory failure - on chronic vent since GBS diagnosis in 06/2016. Typically is on 35% FIO2, now requiring 40% FIO2. Sig increase in secretions with desatuation, high risk mucous plug and arrest - bronch indicated See ID May need peep  Increase Ensure chets pt,  Add mucomysts  NO trach collar trials Keep baseline settings  Hypok supp k  Allow pos balance  2.  HCAP. Dc vanc, unlikely to have res issue with agalactia Maintain ceftazidime- await sens Based on bronch, may need inhaled tobi  Pct is down  Anemia For prbc Cbc to follow No bleeding noted  [name]. Titus Mould, MD, [place] Pgr: [place] [facility] 12/15/2016 9:30 AM

## 2016-12-15 NOTE — Procedures (Signed)
Bronchoscopy Procedure Note Marc Rose [id number] January 30, 1951  Procedure: Bronchoscopy Indications: Diagnostic evaluation of the airways, Obtain specimens for culture and/or other diagnostic studies and Remove secretions  Procedure Details Consent: Risks of procedure as well as the alternatives and risks of each were explained to the (patient/caregiver).  Consent for procedure obtained. Time Out: Verified patient identification, verified procedure, site/side was marked, verified correct patient position, special equipment/implants available, medications/allergies/relevent history reviewed, required imaging and test results available.  Performed  In preparation for procedure, patient was given 100% FiO2 and bronchoscope lubricated. Sedation: Etomidate  Airway entered and the following bronchi were examined: RUL, RML, RLL, LUL, LLL and Bronchi.   Procedures performed: Brushings performed Bronchoscope removed.  , Patient placed back on 100% FiO2 at conclusion of procedure.    Evaluation Hemodynamic Status: BP stable throughout; O2 sats: stable throughout Patient's Current Condition: stable Specimens:  Sent purulent fluid Complications: No apparent complications Patient did tolerate procedure well.   [name]. 12/15/2016   1. Obstruction RLL, RML, BI, lavaged clean moderate 2. BAL RLL, yellow pus 3. No lesions 4. Left l;ung wnl  Not c/w need inhaled ABX  [name]. Titus Mould, MD, [place] Pgr: [place] [facility]

## 2016-12-15 NOTE — Progress Notes (Signed)
Pharmacy Antibiotic Note  Marc Rose is a 66 y.o. male admitted on 12/12/2016 with pneumonia and sepsis.  The patient has previously been on ceftazidime and vancomycin. Cultures and sensitivities today show pseudomonas aeruginosa and group B strep from tracheal aspirate. The pseudomonas was reported intermediate to ceftazidime, so therapy is now being changed to levaquin to cover both organisms. White blood cell counts and procalcitonin continue to trend down.   Plan: Levaquin 750mg  IV q24 hours  Continue to monitor for clinical progress and LOT    Height: 6\' 2"  (188 cm) Weight: 153 lb 10.6 oz (69.7 kg) IBW/kg (Calculated) : 82.2  Temp (24hrs), Avg:99.5 F (37.5 C), Min:98.8 F (37.1 C), Max:99.9 F (37.7 C)   Recent Labs Lab 12/12/16 0130  12/12/16 0645 12/12/16 0904 12/12/16 1438 12/12/16 2031 12/13/16 0051 12/13/16 0550 12/14/16 0556 12/15/16 0558  WBC 7.0  --  15.6*  --   --   --   --  24.9* 16.1* 6.0  CREATININE 1.27*  --  1.04  --   --   --   --  0.69 0.62 0.56*  LATICACIDVEN  --   < > 3.3* 3.4* 2.0* 1.4 1.4  --   --   --   < > = values in this interval not displayed.  Estimated Creatinine Clearance: 89.5 mL/min (A) (by C-G formula based on SCr of 0.56 mg/dL (L)).    Allergies  Allergen Reactions  . Codeine Anaphylaxis, Hives and Swelling  . Penicillins Anaphylaxis, Hives and Swelling  . Latex Itching  . Strawberry Extract Hives    Antimicrobials this admission:  8/12 vanc >> 8/15 8/12 ceftaz >> 8/15 8/12 aztreonam >> 8/12 8/15 levofloxacin >>  Microbiology results:  8/12 BCx: pending 8/12 tracheal asp: PSA, GBS (ceftazidime intermediate, cipro sensitive)  8/12 MRSA PCR: neg 8/12 strep pneumo: negative  8/12 legionella: 8/12 UCx: pending   Jalene Mullet, Pharm.D. PGY1 Pharmacy Resident 12/15/2016 12:41 PM Main Pharmacy: [phone]

## 2016-12-16 ENCOUNTER — Inpatient Hospital Stay (HOSPITAL_COMMUNITY): Payer: PPO

## 2016-12-16 DIAGNOSIS — E876 Hypokalemia: Secondary | ICD-10-CM

## 2016-12-16 DIAGNOSIS — I272 Pulmonary hypertension, unspecified: Secondary | ICD-10-CM

## 2016-12-16 DIAGNOSIS — I5032 Chronic diastolic (congestive) heart failure: Secondary | ICD-10-CM

## 2016-12-16 DIAGNOSIS — I950 Idiopathic hypotension: Secondary | ICD-10-CM

## 2016-12-16 DIAGNOSIS — E119 Type 2 diabetes mellitus without complications: Secondary | ICD-10-CM

## 2016-12-16 DIAGNOSIS — L89890 Pressure ulcer of other site, unstageable: Secondary | ICD-10-CM

## 2016-12-16 LAB — BPAM RBC
BLOOD PRODUCT EXPIRATION DATE: 201808282359
ISSUE DATE / TIME: 201808151039
UNIT TYPE AND RH: 6200

## 2016-12-16 LAB — VITAMIN B12: Vitamin B-12: 939 pg/mL — ABNORMAL HIGH (ref 180–914)

## 2016-12-16 LAB — COMPREHENSIVE METABOLIC PANEL
ALBUMIN: 2 g/dL — AB (ref 3.5–5.0)
ALT: 31 U/L (ref 17–63)
ANION GAP: 7 (ref 5–15)
AST: 22 U/L (ref 15–41)
Alkaline Phosphatase: 90 U/L (ref 38–126)
BUN: 13 mg/dL (ref 6–20)
CALCIUM: 8.4 mg/dL — AB (ref 8.9–10.3)
CHLORIDE: 106 mmol/L (ref 101–111)
CO2: 28 mmol/L (ref 22–32)
Creatinine, Ser: 0.56 mg/dL — ABNORMAL LOW (ref 0.61–1.24)
GFR calc non Af Amer: >60 mL/min (ref >60)
GLUCOSE: 138 mg/dL — AB (ref 65–99)
POTASSIUM: 3.1 mmol/L — AB (ref 3.5–5.1)
SODIUM: 141 mmol/L (ref 135–145)
Total Bilirubin: 0.7 mg/dL (ref 0.3–1.2)
Total Protein: 5.9 g/dL — ABNORMAL LOW (ref 6.5–8.1)

## 2016-12-16 LAB — CBC
HEMATOCRIT: 24 % — AB (ref 39.0–52.0)
HEMOGLOBIN: 7.8 g/dL — AB (ref 13.0–17.0)
MCH: 27.1 pg (ref 26.0–34.0)
MCHC: 32.5 g/dL (ref 30.0–36.0)
MCV: 83.3 fL (ref 78.0–100.0)
Platelets: 279 10*3/uL (ref 150–400)
RBC: 2.88 MIL/uL — AB (ref 4.22–5.81)
RDW: 18.2 % — ABNORMAL HIGH (ref 11.5–15.5)
WBC: 7.9 10*3/uL (ref 4.0–10.5)

## 2016-12-16 LAB — GLUCOSE, CAPILLARY
GLUCOSE-CAPILLARY: 105 mg/dL — AB (ref 65–99)
GLUCOSE-CAPILLARY: 144 mg/dL — AB (ref 65–99)
Glucose-Capillary: 119 mg/dL — ABNORMAL HIGH (ref 65–99)
Glucose-Capillary: 151 mg/dL — ABNORMAL HIGH (ref 65–99)
Glucose-Capillary: 92 mg/dL (ref 65–99)
Glucose-Capillary: 124 mg/dL — ABNORMAL HIGH (ref 65–99)

## 2016-12-16 LAB — TYPE AND SCREEN
ABO/RH(D): A POS
Antibody Screen: NEGATIVE
Unit division: 0

## 2016-12-16 LAB — HEMOGLOBIN AND HEMATOCRIT, BLOOD
HEMATOCRIT: 30 % — AB (ref 39.0–52.0)
Hemoglobin: 10 g/dL — ABNORMAL LOW (ref 13.0–17.0)

## 2016-12-16 LAB — IRON AND TIBC
IRON: 14 ug/dL — AB (ref 45–182)
SATURATION RATIOS: 7 % — AB (ref 17.9–39.5)
TIBC: 188 ug/dL — AB (ref 250–450)
UIBC: 174 ug/dL

## 2016-12-16 LAB — MAGNESIUM
MAGNESIUM: 1.5 mg/dL — AB (ref 1.7–2.4)
MAGNESIUM: 2 mg/dL (ref 1.7–2.4)

## 2016-12-16 LAB — FOLATE: Folate: 26 ng/mL (ref >5.9)

## 2016-12-16 LAB — FERRITIN: FERRITIN: 138 ng/mL (ref 24–336)

## 2016-12-16 LAB — RETICULOCYTES
RBC.: 2.88 MIL/uL — ABNORMAL LOW (ref 4.22–5.81)
Retic Count, Absolute: 31.7 10*3/uL (ref 19.0–186.0)
Retic Ct Pct: 1.1 % (ref 0.4–3.1)

## 2016-12-16 LAB — POTASSIUM: POTASSIUM: 3.6 mmol/L (ref 3.5–5.1)

## 2016-12-16 LAB — PREPARE RBC (CROSSMATCH)

## 2016-12-16 MED ORDER — MAGNESIUM SULFATE 50 % IJ SOLN
3.0000 g | Freq: Once | INTRAVENOUS | Status: AC
  Administered 2016-12-16: 3 g via INTRAVENOUS
  Filled 2016-12-16: qty 6

## 2016-12-16 MED ORDER — SODIUM CHLORIDE 0.9 % IV SOLN: Freq: Once | INTRAVENOUS | Status: DC

## 2016-12-16 MED ORDER — FUROSEMIDE 10 MG/ML IJ SOLN
60.0000 mg | Freq: Once | INTRAMUSCULAR | Status: AC
Start: 2016-12-16 — End: 2016-12-16
  Administered 2016-12-16: 60 mg via INTRAVENOUS
  Filled 2016-12-16: qty 6

## 2016-12-16 MED ORDER — POTASSIUM CHLORIDE 20 MEQ/15ML (10%) PO SOLN
40.0000 meq | Freq: Once | ORAL | Status: AC
  Administered 2016-12-16: 40 meq
  Filled 2016-12-16: qty 30

## 2016-12-16 MED ORDER — GLYCOPYRROLATE 0.2 MG/ML IJ SOLN
0.1000 mg | Freq: Three times a day (TID) | INTRAMUSCULAR | Status: DC
  Administered 2016-12-16 – 2016-12-22 (×18): 0.1 mg via INTRAVENOUS
  Filled 2016-12-16 (×3): qty 1
  Filled 2016-12-16: qty 0.5
  Filled 2016-12-16: qty 1
  Filled 2016-12-16: qty 0.5
  Filled 2016-12-16: qty 1
  Filled 2016-12-16: qty 0.5
  Filled 2016-12-16 (×9): qty 1

## 2016-12-16 MED ORDER — POTASSIUM CHLORIDE 20 MEQ/15ML (10%) PO SOLN
60.0000 meq | Freq: Once | ORAL | Status: AC
Start: 2016-12-16 — End: 2016-12-16
  Administered 2016-12-16: 60 meq
  Filled 2016-12-16: qty 45

## 2016-12-16 NOTE — Progress Notes (Signed)
CSW was informed by Erline Levine at [facility] that they currently do not have any beds available and will not have any until next week potentially. CSW consulted with RN CM and Clinical Supervisor at this time.    Virgie Dad Angelyn Osterberg, MSW, [place] [facility] Clinical Social Worker [phone]

## 2016-12-16 NOTE — Progress Notes (Signed)
CSW was informed by RN that per doctor pt is okay to return to [facility] at this time. CSW reached out to [place] to update her no answer so a voicemail was left. CSW was previously updated by Erline Levine that facility  is not needing a new FL2 or PT orders to return to the facility. RN will contact CSW back once pt's discharge paperwork has been completed.    Virgie Dad Estalene Bergey, MSW, [place] [facility] Clinical Social Worker [phone]

## 2016-12-16 NOTE — Progress Notes (Signed)
PROGRESS NOTE    Marc Rose  MOQ:[id number] DOB: 03/31/1951 DOA: 12/12/2016 PCP: Patient, [name]   Brief Narrative:  66yo WM PMHx Anxiety,Depression, Guillain-Barre Syndrome (06/2016), Tracheostomy and PEG on Chronic Mechanical ventilation, OA, Chronic pain syndrome, DM Type 2, HTN, PAD, and Orthostatic Hypotension (for which he received fludrocortisone 0.52m daily). Tobacco abuse,   Transferred today from [facility] the ER for Fever and AMS. On arrival to the ER he was in shock with BP 50/30. He is unresponsive but unclear what his baseline mental status is. He has foul smelling urine, large unstageable ulcer on foot, and pulmonary infiltrates on CXR. In the ER he was given 30cc/kg IVF bolus, started on broad spectrum antibiotics, and Levophed. No family is present at the time of my exam. Patient withdraws to pain but does not obey commands or attempt to communicate.       Subjective: 8/16 A/O 0 patient will withdraw to painful stimuli, does not follow commands, will not open his eyes,   Assessment & Plan:   Active Problems:   Septic shock ([facility]   HCAP (healthcare-associated pneumonia)   Sepsis due to undetermined organism ([facility]   Normochromic normocytic anemia   Protein-calorie malnutrition, severe   Septic shock due to HCAP -Continue empiric abx    Acute-on-Chronic Hypoxic respiratory failure -Patient by exam and x-ray clearly wet -Lasix 60 mg 1 -Frequent suctioning -Mucomyst (ends 8/16) -Robinul 0.1 mg TID -Chronic vent since GBS 06/2016  -Continue copious secretions (Mucomyst + Robinul)   Anemia(baseline Hgb 8-10  - no clear blood loss     Acute vs Chronic Encephalopathy -Unsure of patient's baseline mental status -Continue to monitor for improvement     Chronic diastolic CHF -Strict I&O -Daily weight -Transfuse for hemoglobin<8 -8/16 transfuse 1 unit PRBC -Lasix 60 mg 1  Pulmonary hypertension -See CHF  Hx Orthostatic Hypotension -on  fludrocortisone chronically   AKI resolved 8/13   Nutrition PEG tube dependent - continue TF   Unstageable right Foot Ulcer  Continue dressing changes and pressure relief boots   DM type II controlled without, complication -23/54Hemoglobin A1c = 5.7   Hypothyroidism -Continue synthroid - TSH not at goal, but pt acutely ill - will not change dose for now - recheck TSH when clinically at baseline    Hypokalemia -Potassium goal> 4 -Potassium 60 mEq -Recheck K/Mg at 1400: Potassium low potassium 40 mEq  Hypomagnesemia -Magnesium goal> 2 -Magnesium IV 3 g    DVT prophylaxis: Lovenox Code Status: Full Family Communication: None Disposition Plan: TBD   Consultants:  PC CM    Procedures/Significant Events:  [facility]    I have personally reviewed and interpreted all radiology studies and my findings are as above.  VENTILATOR SETTINGS: PRVC Vt: 650 ml Set rate: 15 FiO2 40% PEEP: 5    Cultures Blood cultures (8/12): pending Sputum culture (8/12): pending Urine culture (8/12): ordered; waiting on patient to make urine    Antimicrobials: Anti-infectives    Start     Stop   12/15/16 1300  levofloxacin (LEVAQUIN) IVPB 750 mg     12/29/16 2359   12/12/16 2200  cefTAZidime (FORTAZ) 2 g in dextrose 5 % 50 mL IVPB  Status:  Discontinued     12/15/16 1200   12/12/16 1600  vancomycin (VANCOCIN) 500 mg in sodium chloride 0.9 % 100 mL IVPB  Status:  Discontinued     12/12/16 1449   12/12/16  1530  vancomycin (VANCOCIN) IVPB 750 mg/150 ml premix  Status:  Discontinued     12/15/16 0913   12/12/16 0800  aztreonam (AZACTAM) 2 g in dextrose 5 % 50 mL IVPB  Status:  Discontinued     12/12/16 1442   12/12/16 0400  aztreonam (AZACTAM) 2 g in dextrose 5 % 50 mL IVPB  Status:  Discontinued     12/12/16 0408   12/12/16 0400  vancomycin (VANCOCIN) IVPB 1000 mg/200 mL premix  Status:  Discontinued     12/12/16 0408   12/12/16 0145   levofloxacin (LEVAQUIN) IVPB 750 mg     12/12/16 0626   12/12/16 0145  aztreonam (AZACTAM) 2 g in dextrose 5 % 50 mL IVPB     12/12/16 0232   12/12/16 0145  vancomycin (VANCOCIN) IVPB 1000 mg/200 mL premix     12/12/16 0303       Devices    LINES / TUBES:  6.0 Distal XLT Cuffed Shiley Trach (unclear when it was placed) RUE Single Lumen PICC (from [facility]) 2-18G PIV's Foley catheter (from [facility])     Continuous Infusions: . sodium chloride 50 mL/hr at 12/15/16 1800  . feeding supplement (VITAL AF 1.2 CAL) 1,000 mL (12/16/16 0539)  . levofloxacin (LEVAQUIN) IV Stopped (12/15/16 1503)     Objective: Vitals:   12/16/16 0347 12/16/16 0400 12/16/16 0500 12/16/16 0600  BP:  (!) 151/109  (!) 150/98  Pulse:  98  88  Resp:  (!) 25  (!) 21  Temp:  98.8 F (37.1 C)    TempSrc:  Axillary    SpO2: 100% 99%  100%  Weight:   152 lb 1.9 oz (69 kg)   Height:        Intake/Output Summary (Last 24 hours) at 12/16/16 0800 Last data filed at 12/16/16 0700  Gross per 24 hour  Intake             3390 ml  Output             2650 ml  Net              740 ml   Filed Weights   12/14/16 0441 12/15/16 0500 12/16/16 0500  Weight: 146 lb 6.2 oz (66.4 kg) 153 lb 10.6 oz (69.7 kg) 152 lb 1.9 oz (69 kg)    Examination:  General: A/O 0, acute on chronic respiratory distress, cachectic Neck:  Negative scars, masses, torticollis, lymphadenopathy, JVD, #6 tracheostomy in place copious hand/yellowish secretions. Lungs: diffuse rhonchi, without wheezes or crackles Cardiovascular: Regular rate and rhythm without murmur gallop or rub normal S1 and S2 Abdomen: negative abdominal pain, nondistended, positive soft, bowel sounds, no rebound, no ascites, no appreciable mass, PEG tube in place negative sign of infection Extremities: No significant cyanosis, clubbing, or edema bilateral lower extremities Skin: Negative rashes, lesions, ulcers Psychiatric:  Unable to assess secondary to patient's  mental status Central nervous system:  Patient spontaneously moving all extremities unable to complete assessment .     Data Reviewed: Care during the described time interval was provided by me .  I have reviewed this patient's available data, including medical history, events of note, physical examination, and all test results as part of my evaluation.   CBC:  Recent Labs Lab 12/12/16 0130 12/12/16 0645 12/13/16 0550 12/14/16 0556 12/15/16 0558 12/15/16 1453 12/16/16 0447  WBC 7.0 15.6* 24.9* 16.1* 6.0  --  7.9  NEUTROABS 5.8  --  23.2* 14.2* 4.6  --   --  HGB 8.8* 8.6* 7.9* 7.4* 6.6* 7.9* 7.8*  HCT 27.5* 27.4* 24.6* 23.3* 20.3* 24.5* 24.0*  MCV 87.3 87.0 87.2 86.3 86.4  --  83.3  PLT 259 285 275 262 277  --  409   Basic Metabolic Panel:  Recent Labs Lab 12/12/16 0645 12/13/16 0550 12/14/16 0556 12/15/16 0558 12/16/16 0447  NA 139 138 141 142 141  K 4.8 4.1 3.3* 2.9* 3.1*  CL 107 107 111 109 106  CO2 '23 25 25 25 28  ' GLUCOSE 147* 134* 121* 131* 138*  BUN 41* 30* 29* 20 13  CREATININE 1.04 0.69 0.62 0.56* 0.56*  CALCIUM 8.0* 8.5* 8.5* 8.4* 8.4*  MG 1.9 1.9 2.0  --  1.5*  PHOS 3.5 3.1 2.4*  --   --    GFR: Estimated Creatinine Clearance: 88.6 mL/min (A) (by C-G formula based on SCr of 0.56 mg/dL (L)). Liver Function Tests:  Recent Labs Lab 12/12/16 0130 12/13/16 0550 12/16/16 0447  AST 39  --  22  ALT 37  --  31  ALKPHOS 74  --  90  BILITOT 0.8  --  0.7  PROT 5.8*  --  5.9*  ALBUMIN 2.2* 2.2* 2.0*   No results for input(s): LIPASE, AMYLASE in the last 168 hours. No results for input(s): AMMONIA in the last 168 hours. Coagulation Profile:  Recent Labs Lab 12/12/16 0645  INR 1.46   Cardiac Enzymes:  Recent Labs Lab 12/12/16 0130  TROPONINI <0.03   BNP (last 3 results) No results for input(s): PROBNP in the last 8760 hours. HbA1C: No results for input(s): HGBA1C in the last 72 hours. CBG:  Recent Labs Lab 12/15/16 1117 12/15/16 1527  12/15/16 2025 12/15/16 2333 12/16/16 0349  GLUCAP 104* 109* 104* 121* 119*   Lipid Profile: No results for input(s): CHOL, HDL, LDLCALC, TRIG, CHOLHDL, LDLDIRECT in the last 72 hours. Thyroid Function Tests:  Recent Labs  12/13/16 1201  TSH 3.018   Anemia Panel:  Recent Labs  12/16/16 0447  VITAMINB12 939*  FOLATE 26.0  FERRITIN 138  TIBC 188*  IRON 14*  RETICCTPCT 1.1   Urine analysis:    Component Value Date/Time   COLORURINE YELLOW 12/12/2016 2254   APPEARANCEUR HAZY (A) 12/12/2016 2254   LABSPEC 1.021 12/12/2016 2254   PHURINE 5.0 12/12/2016 2254   GLUCOSEU NEGATIVE 12/12/2016 2254   HGBUR MODERATE (A) 12/12/2016 2254   BILIRUBINUR NEGATIVE 12/12/2016 2254   KETONESUR NEGATIVE 12/12/2016 2254   PROTEINUR NEGATIVE 12/12/2016 2254   UROBILINOGEN 1.0 09/16/2013 2001   NITRITE NEGATIVE 12/12/2016 2254   LEUKOCYTESUR NEGATIVE 12/12/2016 2254   Sepsis Labs: '@LABRCNTIP' (procalcitonin:4,lacticidven:4)  ) Recent Results (from the past 240 hour(s))  Culture, respiratory (NON-Expectorated)     Status: None   Collection Time: 12/12/16  1:28 AM  Result Value Ref Range Status   Specimen Description TRACHEAL ASPIRATE  Final   Special Requests NONE  Final   Gram Stain   Final    ABUNDANT WBC PRESENT, PREDOMINANTLY PMN RARE SQUAMOUS EPITHELIAL CELLS PRESENT MODERATE GRAM POSITIVE COCCI IN PAIRS FEW GRAM VARIABLE ROD    Culture   Final    MODERATE PSEUDOMONAS AERUGINOSA MODERATE GROUP B STREP(S.AGALACTIAE)ISOLATED TESTING AGAINST S. AGALACTIAE NOT ROUTINELY PERFORMED DUE TO PREDICTABILITY OF AMP/PEN/VAN SUSCEPTIBILITY.    Report Status 12/15/2016 FINAL  Final   Organism ID, Bacteria PSEUDOMONAS AERUGINOSA  Final      Susceptibility   Pseudomonas aeruginosa - MIC*    CEFTAZIDIME 16 INTERMEDIATE Intermediate  CIPROFLOXACIN <=0.25 SENSITIVE Sensitive     GENTAMICIN 2 SENSITIVE Sensitive     IMIPENEM >=16 RESISTANT Resistant     CEFEPIME INTERMEDIATE  Intermediate     * MODERATE PSEUDOMONAS AERUGINOSA  Blood Culture (routine x 2)     Status: None (Preliminary result)   Collection Time: 12/12/16  1:30 AM  Result Value Ref Range Status   Specimen Description BLOOD LEFT ANTECUBITAL  Final   Special Requests   Final    BOTTLES DRAWN AEROBIC AND ANAEROBIC Blood Culture adequate volume   Culture NO GROWTH 3 DAYS  Final   Report Status PENDING  Incomplete  Blood Culture (routine x 2)     Status: None (Preliminary result)   Collection Time: 12/12/16  1:59 AM  Result Value Ref Range Status   Specimen Description BLOOD LEFT WRIST  Final   Special Requests   Final    BOTTLES DRAWN AEROBIC AND ANAEROBIC Blood Culture adequate volume   Culture NO GROWTH 3 DAYS  Final   Report Status PENDING  Incomplete  Culture, blood (single)     Status: None (Preliminary result)   Collection Time: 12/12/16  6:23 AM  Result Value Ref Range Status   Specimen Description BLOOD LEFT ARM  Final   Special Requests IN PEDIATRIC BOTTLE Blood Culture adequate volume  Final   Culture NO GROWTH 3 DAYS  Final   Report Status PENDING  Incomplete  MRSA PCR Screening     Status: None   Collection Time: 12/12/16  7:03 AM  Result Value Ref Range Status   MRSA by PCR NEGATIVE NEGATIVE Final    Comment:        The GeneXpert MRSA Assay (FDA approved for NASAL specimens only), is one component of a comprehensive MRSA colonization surveillance program. It is not intended to diagnose MRSA infection nor to guide or monitor treatment for MRSA infections.   Urine culture     Status: None   Collection Time: 12/12/16 10:54 PM  Result Value Ref Range Status   Specimen Description URINE, RANDOM  Final   Special Requests NONE  Final   Culture NO GROWTH  Final   Report Status 12/14/2016 FINAL  Final  Culture, respiratory (NON-Expectorated)     Status: None (Preliminary result)   Collection Time: 12/15/16  2:54 PM  Result Value Ref Range Status   Specimen Description  BRONCHIAL ALVEOLAR LAVAGE  Final   Special Requests RLL  Final   Gram Stain   Final    ABUNDANT WBC PRESENT, PREDOMINANTLY PMN NO ORGANISMS SEEN    Culture PENDING  Incomplete   Report Status PENDING  Incomplete         Radiology Studies: Dg Chest Port 1 View  Result Date: 12/16/2016 CLINICAL DATA:  Follow-up pneumonia EXAM: PORTABLE CHEST 1 VIEW COMPARISON:  12/15/2016 FINDINGS: Cardiac shadow is stable. Tracheostomy tube is again seen extending. Right-sided PICC line is again identified within the mid right subclavian vein. Left lung is clear. Persistent changes in the right lung are again noted and stable. Postoperative changes in the thoracolumbar spine are seen. IMPRESSION: No significant interval change from the prior exam. Electronically Signed   By: Inez Catalina M.D.   On: 12/16/2016 06:48   Dg Chest Port 1 View  Result Date: 12/15/2016 CLINICAL DATA:  Preop for bronchoscopy. EXAM: PORTABLE CHEST 1 VIEW COMPARISON:  Chest x-ray dated December 14, 2016. FINDINGS: Unchanged tracheostomy tube with the tip approximately 2.5 cm above the carina. Right-sided PICC catheter with  the tip retracted to the mid subclavian vein, similar to prior study. Diffuse patchy opacities throughout the right lung, unchanged. The left lung is relatively clear. The cardiomediastinal silhouette is stable. No pleural effusion or pneumothorax. IMPRESSION: 1. Unchanged right-sided PICC catheter with tip in the mid subclavian vein. 2. Unchanged diffuse infiltrates throughout the right lung. Electronically Signed   By: Titus Dubin M.D.   On: 12/15/2016 10:48        Scheduled Meds: . acetylcysteine  4 mL Nebulization BID  . chlorhexidine gluconate (MEDLINE KIT)  15 mL Mouth Rinse BID  . Chlorhexidine Gluconate Cloth  6 each Topical Q0600  . clonazePAM  0.5 mg Per Tube Daily  . diphenhydrAMINE  25 mg Per Tube BID  . famotidine  20 mg Per Tube BID  . fludrocortisone  0.2 mg Per Tube Daily  . heparin   5,000 Units Subcutaneous Q8H  . insulin aspart  0-9 Units Subcutaneous Q4H  . levothyroxine  75 mcg Per Tube QAC breakfast  . mouth rinse  15 mL Mouth Rinse QID  . QUEtiapine  100 mg Per Tube QHS  . QUEtiapine  50 mg Per Tube Daily  . sertraline  50 mg Per Tube Daily  . thiamine  100 mg Per Tube Daily   Continuous Infusions: . sodium chloride 50 mL/hr at 12/15/16 1800  . feeding supplement (VITAL AF 1.2 CAL) 1,000 mL (12/16/16 0539)  . levofloxacin (LEVAQUIN) IV Stopped (12/15/16 1503)     LOS: 4 days    Time spent: 40 minutes    WOODS, Geraldo Docker, MD [facility] Pager [phone]   If 7PM-7AM, please contact night-coverage [email or web address] Password [email or web address] 12/16/2016, 8:00 AM

## 2016-12-16 NOTE — H&P (Signed)
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 He also has h/o Anxiety, OA, Chronic pain, DM, HTN, PAD, and Orthostatic hypotension (for which he receives fludrocortisone 0.59m daily). [facility]. On arrival to the ER he was in shock with BP 50/30. He is unresponsive but unclear what his baseline mental status is. He has foul smelling urine, large unstageable ulcer on foot, and pulmonary infiltrates on CXR. In the ER he was given 30cc/kg IVF bolus, started on broad spectrum antibiotics, and Levophed. No family is present at the time of my exam. Patient withdraws to pain but does not obey commands or attempt to communicate.    Review of Systems  Unable to perform ROS: Acuity of condition     SUBJECTIVE: no acute events overnight.  Bronch was done  VITAL SIGNS: BP (!) 147/93   Pulse 88   Temp (!) 100.9 F (38.3 C) (Oral)   Resp (!) 22   Ht _0  (1.88 m)   Wt 69 kg (152 lb 1.9 oz)   SpO2 100%   BMI 19.53 kg/m   HEMODYNAMICS:    VENTILATOR SETTINGS: Vent Mode: PRVC FiO2 (%):  [40 %] 40 % Set Rate:  [15 bmp] 15 bmp Vt Set:  [650 mL] 650 mL PEEP:  [5 cmH20-8 cmH20] 8 cmH20 Plateau Pressure:  [22 cmH20-24 cmH20] 24 cmH20  INTAKE / OUTPUT: I/O last 3 completed shifts: In: 44098[I.V.:1820; Blood:430; Other:150; NG/GT:2160; IV Piggyback:400] Out: 31191[Urine:3385]  PHYSICAL EXAMINATION:  General: awakens, not following commands Neuro: Sedated RASS -1 HEENT: trach c/d/i PULM: ronchi worsening diffuse. Still significant  CV: RRR, no MRG  GI: soft,  bs wnl, no r Extremities: no edema   LABS:  BMET  Recent Labs Lab 12/14/16 0556 12/15/16 0558 12/16/16 0447  NA 141 142 141  K 3.3* 2.9* 3.1*  CL 111 109 106  CO2 _1 BUN 29* 20 13  CREATININE 0.62 0.56* 0.56*  GLUCOSE 121* 131* 138*   Electrolytes  Recent Labs Lab 12/12/16 0645 12/13/16 0550 12/14/16 0556 12/15/16 0558 12/16/16 0447  CALCIUM 8.0* 8.5* 8.5* 8.4* 8.4*  MG 1.9 1.9 2.0  --  1.5*  PHOS 3.5 3.1 2.4*  --   --    CBC  Recent Labs Lab 12/14/16 0556 12/15/16 0558 12/15/16 1453 12/16/16 0447  WBC 16.1* 6.0  --  7.9  HGB 7.4* 6.6* 7.9* 7.8*  HCT 23.3* 20.3* 24.5* 24.0*  PLT 262 277  --  279   Coag's  Recent Labs Lab 12/12/16 0645  APTT 42*  INR 1.46    Sepsis Markers  Recent Labs Lab 12/12/16 0645  12/12/16 1438 12/12/16 2031 12/13/16 0051 12/13/16 0550 12/14/16 0556  LATICACIDVEN 3.3*  < > 2.0* 1.4 1.4  --   --   PROCALCITON 52.44  --   --   --   --  38.20 26.44  < > = values in this interval not displayed. ABG  Recent Labs Lab 12/12/16 0223 12/12/16 0515  PHART 7.372 7.433  PCO2ART 46.7 32.5  PO2ART 51.0* 133*  Liver Enzymes  Recent Labs Lab 12/12/16 0130 12/13/16 0550 12/16/16 0447  AST 39  --  22  ALT 37  --  31  ALKPHOS 74  --  90  BILITOT 0.8  --  0.7  ALBUMIN 2.2* 2.2* 2.0*   Cardiac Enzymes  Recent Labs Lab 12/12/16 0130  TROPONINI <0.03   Glucose  Recent Labs Lab 12/15/16 1117 12/15/16 1527 12/15/16 2025 12/15/16 2333 12/16/16 0349 12/16/16 0814  GLUCAP 104* 109* 104* 121* 119* 105*    Imaging Dg Chest Port 1 View  Result Date: 12/16/2016 CLINICAL DATA:  Follow-up pneumonia EXAM: PORTABLE CHEST 1 VIEW COMPARISON:  12/15/2016 FINDINGS: Cardiac shadow is stable. Tracheostomy tube is again seen extending. Right-sided PICC line is again identified within the mid right subclavian vein. Left lung is clear. Persistent changes in the right lung are again noted and stable. Postoperative  changes in the thoracolumbar spine are seen. IMPRESSION: No significant interval change from the prior exam. Electronically Signed   By: Inez Catalina M.D.   On: 12/16/2016 06:48   Dg Chest Port 1 View  Result Date: 12/15/2016 CLINICAL DATA:  Preop for bronchoscopy. EXAM: PORTABLE CHEST 1 VIEW COMPARISON:  Chest x-ray dated December 14, 2016. FINDINGS: Unchanged tracheostomy tube with the tip approximately 2.5 cm above the carina. Right-sided PICC catheter with the tip retracted to the mid subclavian vein, similar to prior study. Diffuse patchy opacities throughout the right lung, unchanged. The left lung is relatively clear. The cardiomediastinal silhouette is stable. No pleural effusion or pneumothorax. IMPRESSION: 1. Unchanged right-sided PICC catheter with tip in the mid subclavian vein. 2. Unchanged diffuse infiltrates throughout the right lung. Electronically Signed   By: Titus Dubin M.D.   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 He also has h/o Anxiety, OA, Chronic pain, DM, HTN, PAD, and Orthostatic hypotension (for which he received fludrocortisone 0.27m daily). [facility]. On arrival to the ER he was in shock with BP 50/30. He is unresponsive but unclear what his baseline mental status is. He has foul smelling urine, large unstageable ulcer on foot, and pulmonary infiltrates on CXR. In the ER he was given  30cc/kg IVF bolus, started on broad spectrum antibiotics, and Levophed.   PULMONARY Acute-on-Chronic Hypoxic respiratory failure - on chronic vent since GBS diagnosis in 06/2016. Typically is on 35% FIO2, now requiring 40% FIO2. Plan: Full vent support PEEP to 5 Chest PT Continue mucomyst No trach collar trials Keep baseline settings SBT in AM  Hypok Supp k - done/elink  HCAP > pseudomonas Continue ceftazidine  Vanco off Await BAL  Anemia Per primary  PGeorgann Housekeeper AGACNP-BC [facility] Pager [phone] ([phone] 12/16/2016 9:39 AM   STAFF NOTE: ILinwood Dibbles MD FACP have personally reviewed patient's available data, including medical history, events of note, physical examination and test results as part of my evaluation. I have discussed with resident/NP and other care providers such as  pharmacist, RN and RRT. In addition, I personally evaluated patient and elicited key findings of: awake, confused, less ronchi rt, pcxr which I reviewed continues to show improved aeration on rt, bronch was done that did not mandate neb abx, peep was increased with chest pt, and maintain mucomysts x 2 more doses, maintain chest pt, peep to 5 this am , then cpap 5 ps5-10 , goal 6 hours, his neurostatus likley is delirium but may need further work up with GB as his main issue, follow pcxr for atx risk, feeding now, levofloxacin , will need 2 weeks in total for pseudo, follow repeat bronch to see if strep agal re grow> co pathogen?, see if pt can be done, mandatory nocturnal support vent   [name]. Titus Mould, MD, [place] Pgr: [facility] 12/16/2016 10:12 AM

## 2016-12-16 NOTE — NC FL2 (Signed)
[place] LEVEL OF CARE SCREENING TOOL     IDENTIFICATION  Patient Name: Marc Rose Birthdate: 1950-07-25 Sex: male Admission Date (Current Location): 12/12/2016  [facility] and [place] Number:  Herbalist and Address:  The [place] [place], [place] [address], [place], [place] [address]      Provider Number: [id number]  Attending Physician Name and Address:  Allie Bossier, MD  Relative Name and Phone Number:       Current Level of Care: Hospital Recommended Level of Care: Vent SNF Prior Approval Number:    Date Approved/Denied:   PASRR Number:   [id number]   Discharge Plan: Other (Comment) (vent SNF. )    Current Diagnoses: Patient Active Problem List   Diagnosis Date Noted  . Protein-calorie malnutrition, severe 12/14/2016  . HCAP (healthcare-associated pneumonia)   . Sepsis due to undetermined organism ([place])   . Normochromic normocytic anemia   . Septic shock ([place]) 12/12/2016  . DNR (do not resuscitate) discussion   . Urinary tract infection without hematuria   . Pressure injury of skin 06/21/2016  . Palliative care encounter   . Goals of care, counseling/discussion   . 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 Acute respiratory failure ([place]) 06/10/2016  . Malnutrition of moderate degree 06/10/2016  . Ataxic gait   . Muscle weakness (generalized)   . 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 Acute kidney injury (nontraumatic) ([place]) 06/04/2016  . Drug rash 11/12/2015  . Hyperpigmentation 05/15/2015  . Abdominal pain, right lower quadrant 02/05/2015  . Abscess, psoas ([place]) 02/05/2015  . Acute upper respiratory infection 02/05/2015  . Long term current use of antibiotics 02/05/2015  . Benign essential HTN 02/05/2015  . Edema leg 02/05/2015  . Complex regional pain syndrome type 2 of upper extremity 02/05/2015  . Back pain, chronic 02/05/2015  . CN (constipation)  02/05/2015  . CD (contact dermatitis) 02/05/2015  . Elevated fasting blood sugar 02/05/2015  . LBP (low back pain) 02/05/2015  . Discitis of lumbar region 02/05/2015  . Disorder of nutrition 02/05/2015  . Feeling bilious 02/05/2015  . Flu vaccine need 02/05/2015  . Peripheral neuropathic pain 02/05/2015  . Non compliance with medical treatment 02/05/2015  . Abnormal blood chemistry 02/05/2015  . Angiopathy, peripheral ([place]) 02/05/2015  . Nerve disease, peripheral 02/05/2015  . Hypercholesterolemia without hypertriglyceridemia 02/05/2015  . Spinal stenosis 02/05/2015  . Buzzing in ear 02/05/2015  . Compulsive tobacco user syndrome 02/05/2015  . Bladder retention 02/05/2015  . Abnormal weight loss 02/05/2015  . Diskitis 02/05/2015  . Hardware complicating wound infection ([place]) 02/05/2015  . Hyperlipidemia 07/05/2014  . Peripheral arterial disease ([place]) 07/05/2014  . Hypotension 09/16/2013  . HTN (hypertension) 09/16/2013  . Anxiety 09/16/2013  . Back pain 09/16/2013    Orientation RESPIRATION BLADDER Height & Weight      (pt is intubated. )  Vent FIO2 40 percent; Shiley 73m Cuffed  Incontinent Weight: 152 lb 1.9 oz (69 kg) Height:  6' 2" (188 cm)  BEHAVIORAL SYMPTOMS/MOOD NEUROLOGICAL BOWEL NUTRITION STATUS      Incontinent Supplemental (See discharge summary. )  AMBULATORY STATUS COMMUNICATION OF NEEDS Skin   Total Care Non-Verbally Other (Comment) (pt has an ulcer. )                       Personal Care Assistance Level of Assistance  Total care       Total Care Assistance:  Maximum assistance   Functional Limitations Info  Sight, Hearing, Speech Sight Info: Adequate Hearing Info: Adequate Speech Info: Impaired (pt is intubated. )    SPECIAL CARE FACTORS FREQUENCY  Blood pressure                    Contractures Contractures Info: Not present    Additional Factors Info  Code Status, Allergies Code Status Info: FULL  Allergies Info: Codeine,  Penicillins, Latex, Strawberry Extract           Current Medications (12/16/2016):  This is the current hospital active medication list Current Facility-Administered Medications  Medication Dose Route Frequency Provider Last Rate Last Dose  . 0.9 %  sodium chloride infusion   Intravenous Continuous Arrien, Jimmy Picket, MD 50 mL/hr at 12/15/16 1800    . 0.9 %  sodium chloride infusion   Intravenous Once Allie Bossier, MD   Stopped at 12/16/16 [phone]  . acetaminophen (TYLENOL) tablet 650 mg  650 mg Per Tube Q4H PRN Cherene Altes, MD      . acetylcysteine (MUCOMYST) 20 % nebulizer / oral solution 4 mL  4 mL Nebulization BID [name], MD   4 mL at 12/16/16 0801  . albuterol (PROVENTIL) (2.5 MG/3ML) 0.083% nebulizer solution 2.5 mg  2.5 mg Nebulization Q2H PRN Hammonds, Sharyn Blitz, MD   2.5 mg at 12/16/16 0800  . chlorhexidine gluconate (MEDLINE KIT) (PERIDEX) 0.12 % solution 15 mL  15 mL Mouth Rinse BID Hammonds, Sharyn Blitz, MD   15 mL at 12/16/16 0823  . Chlorhexidine Gluconate Cloth 2 % PADS 6 each  6 each Topical Q0600 Javier Glazier, MD   6 each at 12/15/16 0600  . clonazePAM (KLONOPIN) tablet 0.5 mg  0.5 mg Per Tube Daily Desai, Rahul P, PA-C   0.5 mg at 12/16/16 1059  . diphenhydrAMINE (BENADRYL) capsule 25 mg  25 mg Per Tube Q6H PRN Cherene Altes, MD      . diphenhydrAMINE (BENADRYL) capsule 25 mg  25 mg Per Tube BID Cherene Altes, MD   25 mg at 12/16/16 1058  . docusate (COLACE) 50 MG/5ML liquid 100 mg  100 mg Per Tube BID PRN Hammonds, Sharyn Blitz, MD      . famotidine (PEPCID) 40 MG/5ML suspension 20 mg  20 mg Per Tube BID Cherene Altes, MD   20 mg at 12/16/16 1100  . feeding supplement (VITAL AF 1.2 CAL) liquid 1,000 mL  1,000 mL Per Tube Continuous [name], MD 60 mL/hr at 12/16/16 0539 1,000 mL at 12/16/16 0539  . fentaNYL (SUBLIMAZE) injection 25 mcg  25 mcg Intravenous Q2H PRN Hammonds, Sharyn Blitz, MD   25 mcg at 12/15/16 1957  .  fludrocortisone (FLORINEF) tablet 0.2 mg  0.2 mg Per Tube Daily Desai, Rahul P, PA-C   0.2 mg at 12/16/16 1100  . furosemide (LASIX) injection 60 mg  60 mg Intravenous Once Allie Bossier, MD      . glycopyrrolate (ROBINUL) injection 0.1 mg  0.1 mg Intravenous TID Allie Bossier, MD      . haloperidol lactate (HALDOL) injection 5 mg  5 mg Intravenous Q6H PRN Shearon Stalls, Rahul P, PA-C   5 mg at 12/15/16 2047  . heparin injection 5,000 Units  5,000 Units Subcutaneous Q8H Hammonds, Sharyn Blitz, MD   5,000 Units at 12/16/16 7035  . insulin aspart (novoLOG) injection 0-9 Units  0-9 Units Subcutaneous Q4H Cherene Altes, MD  1 Units at 12/15/16 2348  . levofloxacin (LEVAQUIN) IVPB 750 mg  750 mg Intravenous Q24H Jalene Mullet, RPH 100 mL/hr at 12/16/16 1236 750 mg at 12/16/16 1236  . levothyroxine (SYNTHROID, LEVOTHROID) tablet 75 mcg  75 mcg Per Tube QAC breakfast Cherene Altes, MD   75 mcg at 12/16/16 [phone]  . MEDLINE mouth rinse  15 mL Mouth Rinse QID Hammonds, Sharyn Blitz, MD   15 mL at 12/16/16 1235  . midazolam (VERSED) injection 1-4 mg  1-4 mg Intravenous Q1H PRN Javier Glazier, MD   2 mg at 12/16/16 0917  . ondansetron (ZOFRAN) injection 4 mg  4 mg Intravenous Q6H PRN Hammonds, Sharyn Blitz, MD   4 mg at 12/16/16 0916  . QUEtiapine (SEROQUEL) tablet 100 mg  100 mg Per Tube QHS Cherene Altes, MD   100 mg at 12/15/16 2129  . QUEtiapine (SEROQUEL) tablet 50 mg  50 mg Per Tube Daily Cherene Altes, MD   50 mg at 12/16/16 1053  . sertraline (ZOLOFT) tablet 50 mg  50 mg Per Tube Daily Desai, Rahul P, PA-C   50 mg at 12/16/16 1000  . sodium chloride flush (NS) 0.9 % injection 10-40 mL  10-40 mL Intracatheter PRN Arrien, Jimmy Picket, MD      . thiamine (VITAMIN B-1) tablet 100 mg  100 mg Per Tube Daily Desai, Rahul P, PA-C   100 mg at 12/16/16 1059     Discharge Medications: Please see discharge summary for a list of discharge medications.  Relevant Imaging Results:  Relevant  Lab Results:   Additional Information SSN- [id number]  Wetzel Bjornstad, LCSWA

## 2016-12-17 DIAGNOSIS — I10 Essential (primary) hypertension: Secondary | ICD-10-CM

## 2016-12-17 DIAGNOSIS — I739 Peripheral vascular disease, unspecified: Secondary | ICD-10-CM

## 2016-12-17 LAB — CULTURE, BLOOD (SINGLE)
Culture: NO GROWTH
Special Requests: ADEQUATE

## 2016-12-17 LAB — TYPE AND SCREEN
ABO/RH(D): A POS
ANTIBODY SCREEN: NEGATIVE
Unit division: 0

## 2016-12-17 LAB — BASIC METABOLIC PANEL
Anion gap: 10 (ref 5–15)
BUN: 17 mg/dL (ref 6–20)
CALCIUM: 8.4 mg/dL — AB (ref 8.9–10.3)
CHLORIDE: 103 mmol/L (ref 101–111)
CO2: 26 mmol/L (ref 22–32)
CREATININE: 0.49 mg/dL — AB (ref 0.61–1.24)
Glucose, Bld: 119 mg/dL — ABNORMAL HIGH (ref 65–99)
Potassium: 3.3 mmol/L — ABNORMAL LOW (ref 3.5–5.1)
SODIUM: 139 mmol/L (ref 135–145)

## 2016-12-17 LAB — GLUCOSE, CAPILLARY
GLUCOSE-CAPILLARY: 102 mg/dL — AB (ref 65–99)
GLUCOSE-CAPILLARY: 138 mg/dL — AB (ref 65–99)
GLUCOSE-CAPILLARY: 130 mg/dL — AB (ref 65–99)
Glucose-Capillary: 106 mg/dL — ABNORMAL HIGH (ref 65–99)
Glucose-Capillary: 118 mg/dL — ABNORMAL HIGH (ref 65–99)
Glucose-Capillary: 121 mg/dL — ABNORMAL HIGH (ref 65–99)
Glucose-Capillary: 127 mg/dL — ABNORMAL HIGH (ref 65–99)

## 2016-12-17 LAB — CULTURE, BLOOD (ROUTINE X 2)
Culture: NO GROWTH
Culture: NO GROWTH
SPECIAL REQUESTS: ADEQUATE
Special Requests: ADEQUATE

## 2016-12-17 LAB — CULTURE, RESPIRATORY W GRAM STAIN

## 2016-12-17 LAB — CULTURE, RESPIRATORY

## 2016-12-17 LAB — BPAM RBC
Blood Product Expiration Date: 201809052359
ISSUE DATE / TIME: 201808161300
Unit Type and Rh: 6200

## 2016-12-17 LAB — MAGNESIUM: MAGNESIUM: 1.7 mg/dL (ref 1.7–2.4)

## 2016-12-17 MED ORDER — POTASSIUM CHLORIDE 20 MEQ/15ML (10%) PO SOLN
40.0000 meq | Freq: Once | ORAL | Status: AC
  Administered 2016-12-17: 40 meq via ORAL
  Filled 2016-12-17: qty 30

## 2016-12-17 NOTE — Progress Notes (Addendum)
CSW colleague Eliezer Lofts) faxed pt out yesterday to other Vent/SNF facilities in [place] [facility], [place], and [place]. CSW followed up with facilities this morning and left VM for admissions person to contact CSW back. CSW did speak with Santiago Glad from [facility] in [place], and she informed CSW that she thinks she received the fax for pt and that it is now still in review. She also informed CSW that she was out of the office until Tuesday but would contact CSW with information.      CSW spoke with pt's friend Donia Pounds) regarding the possibility of pt having to be sent out of state for care. Ms. [name] isn't against this, however she is hoping that another facility in [place] will come available for pt soon. She mentioned that she did speak with a nurse at [facility] earlier this week and stats that "they said they had a bed available for him". CSW will continue to assist with discharge needs at this time.    Virgie Dad Aaronmichael Brumbaugh, MSW, [facility] Clinical Social Worker [phone]

## 2016-12-17 NOTE — Progress Notes (Signed)
Patient transported to 66M room 9 without incident.

## 2016-12-17 NOTE — Care Management (Signed)
CM reviewed pt with physician advisor - pt is not appropriate for LTACH.  CSW continuing to work on placement

## 2016-12-17 NOTE — Progress Notes (Signed)
PT Cancellation Note  Patient Details Name: Marc Rose MRN: [id number] DOB: 1950-11-30   Cancelled Treatment:    Reason Eval/Treat Not Completed: Patient's level of consciousness. Pt unresponsive to verbal/tactile stimuli. Will check back on Monday for any change. Unsure of baseline.   [place] 12/17/2016, 3:10 PM  [place]

## 2016-12-17 NOTE — Progress Notes (Addendum)
Progress Note    Marc Rose  UEA:[id number] DOB: 1950/11/30  DOA: 12/12/2016 PCP: Patient, [name]    Brief Narrative:   Chief complaint: Follow-up sepsis  Medical records reviewed and are as summarized below:  Marc Rose is an 66 y.o. male with a PMH of Guillain-Barre syndrome status post tracheostomy/ventilator dependency and PEG placement for chronic tube feeding since February/2018, chronic pain, PAD, orthostatic hypotension on chronic fludrocortisone and hypertension who was transferred from his LTAC for evaluation of fever and altered mental status on 12/12/16. He was hypotensive and unresponsive on admission with multiple potential sources of infection including urine, unstageable foot ulcer, and pulmonary infiltrates on chest x-ray. He required Levophed support and fluid challenge.  Summary of hospital course by date: 12/12/16: Admitted by the critical care team and placed on pressor support and broad-spectrum antibiotics. Pan cultured. Stress dose steroids initiated. 12/15/16: Care transferred to [facility]. Septic shock resolved. Given 1 unit of PRBCs. 12/16/16: Thought to be volume overloaded on exam and given Lasix 60 mg 1.  Assessment/Plan:   Principal Problem:   Septic shock (HCC)/HCAP secondary to Pseudomonas Required pressor support on admission. Being treated for HCAP, currently with Levaquin although was on broad-spectrum antibiotics on admission. Tracheal aspirate grew Pseudomonas. Other cultures negative.  Active Problems:   Acute on chronic hypoxic respiratory failure Chronically ventilated. Continue secretion management with Mucomyst and Robinul, frequent suctioning. Received Lasix yesterday. Continue to monitor for volume overload.    HTN (hypertension) Blood pressure stable.    Peripheral arterial disease ([place]) Does not appear to be on antiplatelet therapy at this time. Had arterial Dopplers of the lower extremities done back in 2016 which showed  mild abdominal aortic dilatation at 3 cm but no evidence of PAD otherwise.    Peripheral neuropathic pain Neurontin currently on hold.    Neurogenic urinary bladder disorder/rule out UTI without hematuria Urine culture negative.     Miller-Fisher variant Guillain-Barre syndrome (HCC) Continue supportive care.    Full thickness wound right foot Full thickness wound on right foot lateral fifth digit, not felt to be infected. Stable eschar. Continue conservative treatment with daily cleansing and application of an antimicrobial a stringent daily.    Normochromic normocytic anemia Likely anemia of chronic disease. Received a total of 2 units of PRBCs.    Protein-calorie malnutrition, severe Body mass index is 18.6 kg/m. Continue tube feeding.    Hypokalemia 40 mEq of replacement ordered today.   Family Communication/Anticipated D/C date and plan/Code Status   DVT prophylaxis: Lovenox ordered. Code Status: Full Code. This been DO NOT RESUSCITATE in the past. Will need to have this clarified. Family Communication: No family currently at the bedside. Disposition Plan: Return to [facility] soon.   Medical Consultants:    Critical Care   Anti-Infectives:    Levaquin 12/15/16--->  Fortaz 12/12/16---> 12/15/16  Vancomycin 12/12/16---> 12/15/16  Aztreonam 8/12/181  Levaquin 8/12/181    Subjective:   Lethargic and nonverbal.  Objective:    Vitals:   12/17/16 1221 12/17/16 1300 12/17/16 1548 12/17/16 1628  BP: 126/82 136/86  (!) 131/94  Pulse: 77 75 90 86  Resp: 16 (!) 21 (!) 23 17  Temp:    99.3 F (37.4 C)  TempSrc:    Axillary  SpO2: 100% 100% 100% 100%  Weight:      Height:        Intake/Output Summary (Last 24 hours) at 12/17/16 1741 Last data filed at 12/17/16 1300  Gross per 24 hour  Intake             2400 ml  Output             2650 ml  Net             -250 ml   Filed Weights   12/15/16 0500 12/16/16 0500 12/17/16 0500  Weight: 69.7 kg (153 lb 10.6  oz) 69 kg (152 lb 1.9 oz) 65.7 kg (144 lb 13.5 oz)    Exam: General: Chronically ill appearing male. No acute distress. Cardiovascular: Heart sounds show a regular rate, and rhythm. No gallops or rubs. No murmurs. No JVD. Lungs: Trach present, on vent. Course breath sounds. No rales, rhonchi or wheezes. Abdomen: Abdomen firm with normal active bowel sounds. No masses. No hepatosplenomegaly. Not tender. Neurological: Unresponsive, spontaneously moves upper extremities. Skin: Warm and dry. No rashes or lesions. Extremities: No clubbing or cyanosis. No edema. Pedal pulses 2+. Heel protectors in place. Psychiatric: Unable to assess.   Data Reviewed:   I have personally reviewed following labs and imaging studies:  Labs: Labs show the following: Sodium 139, potassium 3.3, chloride 103, bicarbonate 26, BUN 17, creatinine 0.49, glucose 119, magnesium 1.7. LFTs WNL. Protein 5.9, albumin 2.0. WBC 7.9, hemoglobin 10, hematocrit 30, platelets 279. CBG range 92-144. Anemia panel showed vitamin B-12 939, folate 26, ferritin 138, TIBC 188, iron 14.  Sepsis Labs:  Recent Labs Lab 12/12/16 0645 12/12/16 0904 12/12/16 1438 12/12/16 2031 12/13/16 0051 12/13/16 0550 12/14/16 0556 12/15/16 0558 12/16/16 0447  PROCALCITON 52.44  --   --   --   --  38.20 26.44  --   --   WBC 15.6*  --   --   --   --  24.9* 16.1* 6.0 7.9  LATICACIDVEN 3.3* 3.4* 2.0* 1.4 1.4  --   --   --   --     Microbiology 12/12/16: Tracheal aspirate cultures: Moderate pseudomonas aeruginosa and group B strep. 12/12/16: Blood culture left antecubital: No growth. 12/12/16: Blood culture left wrist: No growth. 12/12/16: Blood culture left arm: No growth. 12/12/16: MRSA screen negative. 12/12/16: Urine culture: Negative. 12/15/16: BAL cultures grew multiple organisms, none predominant.  Procedures and diagnostic studies: 12/12/16: CXR: Dense right pneumonia. 12/13/16: CXR: RLL pneumonia. 12/14/16: CXR: Right PICC tip over right  subclavian vein. No pneumothorax. 12/15/16: CXR: Unchanged diffuse infiltrates throughout the right lung. 12/16/16: CXR: My independent review of the image shows: Significant right-sided pneumonia. No cardiomegaly. No evidence of CHF.    Medications:   . chlorhexidine gluconate (MEDLINE KIT)  15 mL Mouth Rinse BID  . clonazePAM  0.5 mg Per Tube Daily  . diphenhydrAMINE  25 mg Per Tube BID  . famotidine  20 mg Per Tube BID  . fludrocortisone  0.2 mg Per Tube Daily  . glycopyrrolate  0.1 mg Intravenous TID  . heparin  5,000 Units Subcutaneous Q8H  . insulin aspart  0-9 Units Subcutaneous Q4H  . levothyroxine  75 mcg Per Tube QAC breakfast  . mouth rinse  15 mL Mouth Rinse QID  . QUEtiapine  100 mg Per Tube QHS  . QUEtiapine  50 mg Per Tube Daily  . sertraline  50 mg Per Tube Daily  . thiamine  100 mg Per Tube Daily   Continuous Infusions: . sodium chloride 50 mL/hr at 12/15/16 1800  . sodium chloride Stopped (12/16/16 0917)  . feeding supplement (VITAL AF 1.2 CAL) 1,000 mL (12/16/16 2356)  . levofloxacin (  LEVAQUIN) IV Stopped (12/17/16 1457)    LOS: 5 days   Marc Rose  [facility] Pager [phone]. If unable to reach me by pager, please call my cell phone at [phone].  *Please refer to [email or web address], password [email or web address] to get updated schedule on who will round on this patient, as hospitalists switch teams weekly. If 7PM-7AM, please contact night-coverage at [email or web address], password [email or web address] for any overnight needs.  12/17/2016, 5:41 PM

## 2016-12-17 NOTE — Consult Note (Signed)
   [facility] CM Inpatient Consult   12/17/2016  ROYDEN BULMAN 15-May-1950 [id number]   Chart reviewed for patient in [employer]/ACO. Patient is currently beiing recommended for vent skilled facility. Reviewed inpatient CSW notes for details. Will sign off as patient has no current [place] care management follow up needs.    Natividad Brood, RN BSN [facility] Liaison  [phone] business mobile phone Toll free office [phone]

## 2016-12-17 NOTE — Progress Notes (Signed)
[facility] called to request ventilator flow sheets- requested clinicals sent and RN staff reviewing   Jorge Ny, [place] Social Worker [phone]

## 2016-12-17 NOTE — Progress Notes (Signed)
CSW spoke with Cardwell from [facility] in [place]. She sought information on pt being in restraints still. CSW spoke with pt's RN Shea Stakes) and confirmed that pt is currently no longer in restraints. Tawanna Sat mentioned that she will send pt's referral to Respiratory and would contact CSW back today or Monday with a decision.    Virgie Dad Caitlynn Ju, MSW, [facility] Clinical Social Worker [phone]

## 2016-12-18 LAB — BASIC METABOLIC PANEL WITH GFR
Anion gap: 6 (ref 5–15)
BUN: 21 mg/dL — ABNORMAL HIGH (ref 6–20)
CO2: 30 mmol/L (ref 22–32)
Calcium: 8.6 mg/dL — ABNORMAL LOW (ref 8.9–10.3)
Chloride: 105 mmol/L (ref 101–111)
Creatinine, Ser: 0.54 mg/dL — ABNORMAL LOW (ref 0.61–1.24)
GFR calc Af Amer: >60 mL/min
GFR calc non Af Amer: >60 mL/min
Glucose, Bld: 112 mg/dL — ABNORMAL HIGH (ref 65–99)
Potassium: 3.5 mmol/L (ref 3.5–5.1)
Sodium: 141 mmol/L (ref 135–145)

## 2016-12-18 LAB — CBC
HCT: 28.2 % — ABNORMAL LOW (ref 39.0–52.0)
Hemoglobin: 9.2 g/dL — ABNORMAL LOW (ref 13.0–17.0)
MCH: 27.9 pg (ref 26.0–34.0)
MCHC: 32.6 g/dL (ref 30.0–36.0)
MCV: 85.5 fL (ref 78.0–100.0)
Platelets: 331 K/uL (ref 150–400)
RBC: 3.3 MIL/uL — ABNORMAL LOW (ref 4.22–5.81)
RDW: 18 % — ABNORMAL HIGH (ref 11.5–15.5)
WBC: 5.6 K/uL (ref 4.0–10.5)

## 2016-12-18 LAB — GLUCOSE, CAPILLARY
GLUCOSE-CAPILLARY: 97 mg/dL (ref 65–99)
GLUCOSE-CAPILLARY: 113 mg/dL — AB (ref 65–99)
Glucose-Capillary: 102 mg/dL — ABNORMAL HIGH (ref 65–99)
Glucose-Capillary: 111 mg/dL — ABNORMAL HIGH (ref 65–99)
Glucose-Capillary: 119 mg/dL — ABNORMAL HIGH (ref 65–99)
Glucose-Capillary: 117 mg/dL — ABNORMAL HIGH (ref 65–99)

## 2016-12-18 LAB — MAGNESIUM: Magnesium: 1.8 mg/dL (ref 1.7–2.4)

## 2016-12-18 MED ORDER — POTASSIUM CHLORIDE 20 MEQ/15ML (10%) PO SOLN
40.0000 meq | Freq: Once | ORAL | Status: AC
  Administered 2016-12-18: 40 meq via ORAL
  Filled 2016-12-18: qty 30

## 2016-12-18 NOTE — Progress Notes (Signed)
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 He also has a h/o Anxiety, OA, Chronic pain, DM, HTN, PAD, and Orthostatic hypotension. He was transferred from [facility] to the ER on 8/12 w/ Fever and AMS. On arrival to the ER he was in shock with BP 50/30. He had foul smelling urine, a large unstageable ulcer on his foot, and pulmonary infiltrates on CXR.  Significant Events: 8/12 admit to [facility] ICU 8/15 [facility] assumed care   Subjective: The patient is noncommunicative.  He will open his eyes when I speak to him or touch him but does not interact.  He does not appear to be uncomfortable.  He is in no apparent acute respiratory distress.    Assessment & Plan:  Septic shock due to Pseudomonas HCAP Continue directed abx - shock resolved - BP recovering and climbing   Acute-on-Chronic Hypoxic respiratory failure chronic vent since GBS 06/2016 - typically on 35% FIO2 - ongoing vent care per PCCM - approaching his usual O2 dose   Anemia  baseline Hgb 8-10 - no clear blood loss - ?simply dilutional - transfused 2U PRBC since admit - stable at this time   Recent Labs Lab 12/15/16 0558 12/15/16 1453 12/16/16 0447 12/16/16 1819 12/18/16 0345  HGB 6.6* 7.9* 7.8* 10.0* 9.2*    Acute Encephalopathy presumed due to sepsis - perhaps this is his baseline, as he does not appear to be improving w/ resolution of sepsis   Hx Orthostatic Hypotension on fludrocortisone chronically  AKI resolved 8/13  Hypokalemia  Supplement to goal of 4.0  Protein-calorie malnutrition, severe PEG tube dependent - continue TF  Unstageable Foot Ulcer  Continue dressing changes and pressure relief boots - not felt to be infected    DM CBG well controlled at this time  Hx hypothyroidism Continue synthroid - TSH not at goal, but pt acutely ill - will not change dose for now - recheck TSH when clinically at baseline   DVT prophylaxis: lovenox  Code Status: FULL CODE Family Communication: no family present at time of exam  Disposition Plan: vent capable SDU - ready for return to [facility] when bed available   Consultants:  PCCM  Antimicrobials:  Vancomycin 8/12 > 8/15 Aztreonam 8/11 > 8/12 Ceftazidime 8/12 > 8/15 Levaquin 8/15 >  Objective: Blood pressure 131/84, pulse 78, temperature 99.1 F (37.3 C), temperature source Oral, resp. rate 19, height 6' 2" (1.88 m), weight 65 kg (143 lb 4.8 oz), SpO2 100 %.  Intake/Output Summary (Last 24 hours) at 12/18/16 1634 Last data filed at 12/18/16 1404  Gross per 24 hour  Intake             2270 ml  Output             1000 ml  Net             1270 ml   Filed Weights   12/16/16 0500 12/17/16 0500 12/18/16 0500  Weight: 69 kg (152 lb 1.9 oz) 65.7 kg (144 lb 13.5 oz) 65 kg (143 lb 4.8 oz)    Examination: General: No acute respiratory distress on vent via trach  Lungs: CTA th/o - no wheezing   Cardiovascular: RRR w/o rub or M  Abdomen: nontender, nondistended, soft, bowel sounds hypoactive, no rebound Extremities: No significant edema bilateral lower extremities  CBC:  Recent Labs Lab 12/12/16 0130  12/13/16 0550 12/14/16 0556 12/15/16 0558 12/15/16 1453 12/16/16 0447 12/16/16 1819 12/18/16 0345  WBC 7.0  < > 24.9* 16.1* 6.0  --  7.9  --  5.6  NEUTROABS 5.8  --  23.2* 14.2* 4.6  --   --   --   --   HGB 8.8*  < > 7.9* 7.4* 6.6* 7.9* 7.8* 10.0* 9.2*  HCT 27.5*  < > 24.6* 23.3* 20.3* 24.5* 24.0* 30.0* 28.2*  MCV 87.3  < > 87.2 86.3 86.4  --  83.3  --  85.5  PLT 259  < > 275 262 277  --  279  --  331  < > = values in this interval not displayed. Basic Metabolic Panel:  Recent Labs Lab 12/12/16 0645 12/13/16 0550 12/14/16 0556 12/15/16 0558  12/16/16 0447 12/16/16 1449 12/17/16 0942 12/18/16 0345  NA 139 138 141 142 141  --  139 141  K 4.8 4.1 3.3* 2.9* 3.1* 3.6 3.3* 3.5  CL 107 107 111 109 106  --  103 105  CO2 _0 --  26 30  GLUCOSE 147* 134* 121* 131* 138*  --  119* 112*  BUN 41* 30* 29* 20 13  --  17 21*  CREATININE 1.04 0.69 0.62 0.56* 0.56*  --  0.49* 0.54*  CALCIUM 8.0* 8.5* 8.5* 8.4* 8.4*  --  8.4* 8.6*  MG 1.9 1.9 2.0  --  1.5* 2.0 1.7 1.8  PHOS 3.5 3.1 2.4*  --   --   --   --   --    GFR: Estimated Creatinine Clearance: 83.5 mL/min (A) (by C-G formula based on SCr of 0.54 mg/dL (L)).  Liver Function Tests:  Recent Labs Lab 12/12/16 0130 12/13/16 0550 12/16/16 0447  AST 39  --  22  ALT 37  --  31  ALKPHOS 74  --  90  BILITOT 0.8  --  0.7  PROT 5.8*  --  5.9*  ALBUMIN 2.2* 2.2* 2.0*    Coagulation Profile:  Recent Labs Lab 12/12/16 0645  INR 1.46    Cardiac Enzymes:  Recent Labs Lab 12/12/16 0130  TROPONINI <0.03    HbA1C: Hgb A1c MFr Bld  Date/Time Value Ref Range Status  06/19/2016 05:00 AM 5.7 (H) 4.8 - 5.6 % Final    Comment:    (NOTE)         Pre-diabetes: 5.7 - 6.4         Diabetes: >6.4         Glycemic control for adults with diabetes: <7.0   09/17/2013 06:34 PM 6.5 (H) <5.7 % Final    Comment:    (NOTE)                                                                       According to the ADA Clinical Practice Recommendations for 2011, when HbA1c is used as a screening test:  >=6.5%   Diagnostic of Diabetes Mellitus           (if abnormal result is confirmed) 5.7-6.4%   Increased  risk of developing Diabetes Mellitus References:Diagnosis and Classification of Diabetes Mellitus,Diabetes WLSL,3734,28(JGOTL 1):S62-S69 and Standards of Medical Care in         Diabetes - 2011,Diabetes XBWI,2035,59 (Suppl 1):S11-S61.    CBG:  Recent Labs Lab 12/17/16 2118 12/17/16 2350 12/18/16 0404 12/18/16 0724 12/18/16 1150  GLUCAP 127* 118* 102* 97 113*     Recent Results (from the past 240 hour(s))  Culture, respiratory (NON-Expectorated)     Status: None   Collection Time: 12/12/16  1:28 AM  Result Value Ref Range Status   Specimen Description TRACHEAL ASPIRATE  Final   Special Requests NONE  Final   Gram Stain   Final    ABUNDANT WBC PRESENT, PREDOMINANTLY PMN RARE SQUAMOUS EPITHELIAL CELLS PRESENT MODERATE GRAM POSITIVE COCCI IN PAIRS FEW GRAM VARIABLE ROD    Culture   Final    MODERATE PSEUDOMONAS AERUGINOSA MODERATE GROUP B STREP(S.AGALACTIAE)ISOLATED TESTING AGAINST S. AGALACTIAE NOT ROUTINELY PERFORMED DUE TO PREDICTABILITY OF AMP/PEN/VAN SUSCEPTIBILITY.    Report Status 12/15/2016 FINAL  Final   Organism ID, Bacteria PSEUDOMONAS AERUGINOSA  Final      Susceptibility   Pseudomonas aeruginosa - MIC*    CEFTAZIDIME 16 INTERMEDIATE Intermediate     CIPROFLOXACIN <=0.25 SENSITIVE Sensitive     GENTAMICIN 2 SENSITIVE Sensitive     IMIPENEM >=16 RESISTANT Resistant     CEFEPIME INTERMEDIATE Intermediate     * MODERATE PSEUDOMONAS AERUGINOSA  Blood Culture (routine x 2)     Status: None   Collection Time: 12/12/16  1:30 AM  Result Value Ref Range Status   Specimen Description BLOOD LEFT ANTECUBITAL  Final   Special Requests   Final    BOTTLES DRAWN AEROBIC AND ANAEROBIC Blood Culture adequate volume   Culture NO GROWTH 5 DAYS  Final   Report Status 12/17/2016 FINAL  Final  Blood Culture (routine x 2)     Status: None   Collection Time: 12/12/16  1:59 AM  Result Value Ref Range Status   Specimen Description BLOOD LEFT WRIST  Final   Special Requests   Final    BOTTLES DRAWN AEROBIC AND ANAEROBIC Blood Culture adequate volume   Culture NO GROWTH 5 DAYS  Final   Report Status 12/17/2016 FINAL  Final  Culture, blood (single)     Status: None   Collection Time: 12/12/16  6:23 AM  Result Value Ref Range Status   Specimen Description BLOOD LEFT ARM  Final   Special Requests IN PEDIATRIC BOTTLE Blood Culture adequate volume   Final   Culture NO GROWTH 5 DAYS  Final   Report Status 12/17/2016 FINAL  Final  MRSA PCR Screening     Status: None   Collection Time: 12/12/16  7:03 AM  Result Value Ref Range Status   MRSA by PCR NEGATIVE NEGATIVE Final    Comment:        The GeneXpert MRSA Assay (FDA approved for NASAL specimens only), is one component of a comprehensive MRSA colonization surveillance program. It is not intended to diagnose MRSA infection nor to guide or monitor treatment for MRSA infections.   Urine culture     Status: None   Collection Time: 12/12/16 10:54 PM  Result Value Ref Range Status   Specimen Description URINE, RANDOM  Final   Special Requests NONE  Final   Culture NO GROWTH  Final   Report Status 12/14/2016 FINAL  Final  Culture, respiratory (NON-Expectorated)     Status: Abnormal   Collection Time: 12/15/16  2:54  PM  Result Value Ref Range Status   Specimen Description BRONCHIAL ALVEOLAR LAVAGE  Final   Special Requests RLL  Final   Gram Stain   Final    ABUNDANT WBC PRESENT, PREDOMINANTLY PMN NO ORGANISMS SEEN    Culture MULTIPLE ORGANISMS PRESENT, NONE PREDOMINANT (A)  Final   Report Status 12/17/2016 FINAL  Final     Scheduled Meds: . chlorhexidine gluconate (MEDLINE KIT)  15 mL Mouth Rinse BID  . clonazePAM  0.5 mg Per Tube Daily  . diphenhydrAMINE  25 mg Per Tube BID  . famotidine  20 mg Per Tube BID  . fludrocortisone  0.2 mg Per Tube Daily  . glycopyrrolate  0.1 mg Intravenous TID  . heparin  5,000 Units Subcutaneous Q8H  . insulin aspart  0-9 Units Subcutaneous Q4H  . levothyroxine  75 mcg Per Tube QAC breakfast  . mouth rinse  15 mL Mouth Rinse QID  . QUEtiapine  100 mg Per Tube QHS  . QUEtiapine  50 mg Per Tube Daily  . sertraline  50 mg Per Tube Daily  . thiamine  100 mg Per Tube Daily     LOS: 6 days   Cherene Altes, MD [facility] Office  [phone] Pager - Text Page per Amion as per below:  On-Call/Text Page:      [email or web address]       password [email or web address]  If 7PM-7AM, please contact night-coverage [email or web address] Password [email or web address] 12/18/2016, 4:34 PM

## 2016-12-19 LAB — CBC
HEMATOCRIT: 29.8 % — AB (ref 39.0–52.0)
HEMOGLOBIN: 9.8 g/dL — AB (ref 13.0–17.0)
MCH: 27.7 pg (ref 26.0–34.0)
MCHC: 32.9 g/dL (ref 30.0–36.0)
MCV: 84.2 fL (ref 78.0–100.0)
Platelets: 398 10*3/uL (ref 150–400)
RBC: 3.54 MIL/uL — ABNORMAL LOW (ref 4.22–5.81)
RDW: 17.3 % — AB (ref 11.5–15.5)
WBC: 8.2 10*3/uL (ref 4.0–10.5)

## 2016-12-19 LAB — GLUCOSE, CAPILLARY
GLUCOSE-CAPILLARY: 116 mg/dL — AB (ref 65–99)
GLUCOSE-CAPILLARY: 116 mg/dL — AB (ref 65–99)
Glucose-Capillary: 100 mg/dL — ABNORMAL HIGH (ref 65–99)
Glucose-Capillary: 105 mg/dL — ABNORMAL HIGH (ref 65–99)
Glucose-Capillary: 159 mg/dL — ABNORMAL HIGH (ref 65–99)
Glucose-Capillary: 97 mg/dL (ref 65–99)

## 2016-12-19 LAB — BASIC METABOLIC PANEL
ANION GAP: 8 (ref 5–15)
BUN: 18 mg/dL (ref 6–20)
CO2: 27 mmol/L (ref 22–32)
Calcium: 8.6 mg/dL — ABNORMAL LOW (ref 8.9–10.3)
Chloride: 104 mmol/L (ref 101–111)
Creatinine, Ser: 0.58 mg/dL — ABNORMAL LOW (ref 0.61–1.24)
GFR calc Af Amer: >60 mL/min (ref >60)
GLUCOSE: 112 mg/dL — AB (ref 65–99)
POTASSIUM: 3.7 mmol/L (ref 3.5–5.1)
Sodium: 139 mmol/L (ref 135–145)

## 2016-12-19 MED ORDER — METOPROLOL TARTRATE 5 MG/5ML IV SOLN
5.0000 mg | Freq: Four times a day (QID) | INTRAVENOUS | Status: DC | PRN
  Administered 2016-12-19: 5 mg via INTRAVENOUS
  Filled 2016-12-19: qty 5

## 2016-12-19 MED ORDER — SODIUM CHLORIDE 0.9 % IV BOLUS (SEPSIS)
1000.0000 mL | Freq: Once | INTRAVENOUS | Status: AC
Start: 2016-12-19 — End: 2016-12-19
  Administered 2016-12-19: 1000 mL via INTRAVENOUS

## 2016-12-19 MED ORDER — ACETAMINOPHEN 500 MG PO TABS
500.0000 mg | ORAL_TABLET | Freq: Once | ORAL | Status: AC | PRN
  Administered 2016-12-19: 500 mg via ORAL
  Filled 2016-12-19: qty 1

## 2016-12-19 MED ORDER — FENTANYL CITRATE (PF) 100 MCG/2ML IJ SOLN
50.0000 ug | INTRAMUSCULAR | Status: DC | PRN
  Administered 2016-12-19: 50 ug via INTRAVENOUS
  Administered 2016-12-19 – 2016-12-21 (×3): 75 ug via INTRAVENOUS
  Filled 2016-12-19 (×4): qty 2

## 2016-12-19 NOTE — Progress Notes (Signed)
[place] TEAM 1 - Stepdown/ICU TEAM  TAVARI LOADHOLT  IPJ:[id number] DOB: Aug 30, 1950 DOA: 12/12/2016 PCP: Patient, [name]    Brief Narrative:  66yo M with history of Guillain-Barre Syndrome (06/2016) for which he has a Tracheostomy and PEG and is on Chronic Mechanical ventilation and is living at [facility]. He also has a h/o Anxiety, OA, Chronic pain, DM, HTN, PAD, and Orthostatic hypotension. He was transferred from [facility] to the ER on 8/12 w/ Fever and AMS. On arrival to the ER he was in shock with BP 50/30. He had foul smelling urine, a large unstageable ulcer on his foot, and pulmonary infiltrates on CXR.  Significant Events: 8/12 admit to [facility] ICU 8/15 [facility] assumed care   Subjective: Pt has been intermittently tachypneic today, fighting against his vent at times.  He does not otherwise appear uncomfortable.  He does not respond to me at the time of my exam.    Assessment & Plan:  Septic shock due to Pseudomonas HCAP shock resolved - BP recovered - temp to 101.2 early this AM concerning - monitor w/o change in abx at this time   Acute-on-Chronic Hypoxic respiratory failure chronic vent since GBS 06/2016 - typically on 35% FIO2 - ongoing vent care per PCCM - approaching his usual O2 dose - no clear etio for today's tachypnea - follow   Anemia  baseline Hgb 8-10 - no clear blood loss - ?simply dilutional - transfused 2U PRBC since admit - stable   Recent Labs Lab 12/15/16 1453 12/16/16 0447 12/16/16 1819 12/18/16 0345 12/19/16 0301  HGB 7.9* 7.8* 10.0* 9.2* 9.8*    Acute Encephalopathy presumed due to sepsis - perhaps this is his baseline, as he does not appear to be improving w/ resolution of sepsis   Hx Orthostatic Hypotension on fludrocortisone chronically - this is being continued   AKI resolved 8/13  Hypokalemia  Supplement to goal of 4.0  Protein-calorie malnutrition, severe PEG tube dependent - continue TF  Unstageable Foot Ulcer    Continue dressing changes and pressure relief boots - not felt to be infected   DM CBG controlled at this time  Hx hypothyroidism Continue synthroid - TSH not at goal, but pt acutely ill - will not change dose for now - recheck TSH when clinically at baseline   DVT prophylaxis: lovenox  Code Status: FULL CODE Family Communication: no family present at time of exam  Disposition Plan: vent capable SDU - ready for return to [facility] when bed available   Consultants:  PCCM  Antimicrobials:  Vancomycin 8/12 > 8/15 Aztreonam 8/11 > 8/12 Ceftazidime 8/12 > 8/15 Levaquin 8/15 >  Objective: Blood pressure 110/76, pulse 80, temperature 99.3 F (37.4 C), temperature source Axillary, resp. rate 16, height '6\' 2"'  (1.88 m), weight 65.8 kg (145 lb 1 oz), SpO2 100 %.  Intake/Output Summary (Last 24 hours) at 12/19/16 1606 Last data filed at 12/19/16 1214  Gross per 24 hour  Intake           1380.5 ml  Output             1175 ml  Net            205.5 ml   Filed Weights   12/17/16 0500 12/18/16 0500 12/19/16 0313  Weight: 65.7 kg (144 lb 13.5 oz) 65 kg (143 lb 4.8 oz) 65.8 kg (145 lb 1 oz)    Examination: General: intermittent tachypnea but otherwise no evidence of distress Lungs: CTA  th/o - no signif wheezing or tracheal secretions  Cardiovascular: RRR - no M Abdomen: NT/ND, soft, bowel sounds hypoactive, no rebound Extremities: No edema B LE   CBC:  Recent Labs Lab 12/13/16 0550 12/14/16 0556 12/15/16 0558 12/15/16 1453 12/16/16 0447 12/16/16 1819 12/18/16 0345 12/19/16 0301  WBC 24.9* 16.1* 6.0  --  7.9  --  5.6 8.2  NEUTROABS 23.2* 14.2* 4.6  --   --   --   --   --   HGB 7.9* 7.4* 6.6* 7.9* 7.8* 10.0* 9.2* 9.8*  HCT 24.6* 23.3* 20.3* 24.5* 24.0* 30.0* 28.2* 29.8*  MCV 87.2 86.3 86.4  --  83.3  --  85.5 84.2  PLT 275 262 277  --  279  --  331 737   Basic Metabolic Panel:  Recent Labs Lab 12/13/16 0550 12/14/16 0556 12/15/16 0558 12/16/16 0447 12/16/16 1449  12/17/16 0942 12/18/16 0345 12/19/16 0301  NA 138 141 142 141  --  139 141 139  K 4.1 3.3* 2.9* 3.1* 3.6 3.3* 3.5 3.7  CL 107 111 109 106  --  103 105 104  CO2 '25 25 25 28  ' --  '26 30 27  ' GLUCOSE 134* 121* 131* 138*  --  119* 112* 112*  BUN 30* 29* 20 13  --  17 21* 18  CREATININE 0.69 0.62 0.56* 0.56*  --  0.49* 0.54* 0.58*  CALCIUM 8.5* 8.5* 8.4* 8.4*  --  8.4* 8.6* 8.6*  MG 1.9 2.0  --  1.5* 2.0 1.7 1.8  --   PHOS 3.1 2.4*  --   --   --   --   --   --    GFR: Estimated Creatinine Clearance: 84.5 mL/min (A) (by C-G formula based on SCr of 0.58 mg/dL (L)).  Liver Function Tests:  Recent Labs Lab 12/13/16 0550 12/16/16 0447  AST  --  22  ALT  --  31  ALKPHOS  --  90  BILITOT  --  0.7  PROT  --  5.9*  ALBUMIN 2.2* 2.0*    HbA1C: Hgb A1c MFr Bld  Date/Time Value Ref Range Status  06/19/2016 05:00 AM 5.7 (H) 4.8 - 5.6 % Final    Comment:    (NOTE)         Pre-diabetes: 5.7 - 6.4         Diabetes: >6.4         Glycemic control for adults with diabetes: <7.0   09/17/2013 06:34 PM 6.5 (H) <5.7 % Final    Comment:    (NOTE)                                                                       According to the ADA Clinical Practice Recommendations for 2011, when HbA1c is used as a screening test:  >=6.5%   Diagnostic of Diabetes Mellitus           (if abnormal result is confirmed) 5.7-6.4%   Increased risk of developing Diabetes Mellitus References:Diagnosis and Classification of Diabetes Mellitus,Diabetes VGKK,1594,70(RAJHH 1):S62-S69 and Standards of Medical Care in         Diabetes - 2011,Diabetes Care,2011,34 (Suppl 1):S11-S61.    CBG:  Recent Labs Lab 12/18/16 2036 12/18/16 2317  12/19/16 0402 12/19/16 0738 12/19/16 1127  GLUCAP 119* 117* 100* 105* 116*    Recent Results (from the past 240 hour(s))  Culture, respiratory (NON-Expectorated)     Status: None   Collection Time: 12/12/16  1:28 AM  Result Value Ref Range Status   Specimen Description  TRACHEAL ASPIRATE  Final   Special Requests NONE  Final   Gram Stain   Final    ABUNDANT WBC PRESENT, PREDOMINANTLY PMN RARE SQUAMOUS EPITHELIAL CELLS PRESENT MODERATE GRAM POSITIVE COCCI IN PAIRS FEW GRAM VARIABLE ROD    Culture   Final    MODERATE PSEUDOMONAS AERUGINOSA MODERATE GROUP B STREP(S.AGALACTIAE)ISOLATED TESTING AGAINST S. AGALACTIAE NOT ROUTINELY PERFORMED DUE TO PREDICTABILITY OF AMP/PEN/VAN SUSCEPTIBILITY.    Report Status 12/15/2016 FINAL  Final   Organism ID, Bacteria PSEUDOMONAS AERUGINOSA  Final      Susceptibility   Pseudomonas aeruginosa - MIC*    CEFTAZIDIME 16 INTERMEDIATE Intermediate     CIPROFLOXACIN <=0.25 SENSITIVE Sensitive     GENTAMICIN 2 SENSITIVE Sensitive     IMIPENEM >=16 RESISTANT Resistant     CEFEPIME INTERMEDIATE Intermediate     * MODERATE PSEUDOMONAS AERUGINOSA  Blood Culture (routine x 2)     Status: None   Collection Time: 12/12/16  1:30 AM  Result Value Ref Range Status   Specimen Description BLOOD LEFT ANTECUBITAL  Final   Special Requests   Final    BOTTLES DRAWN AEROBIC AND ANAEROBIC Blood Culture adequate volume   Culture NO GROWTH 5 DAYS  Final   Report Status 12/17/2016 FINAL  Final  Blood Culture (routine x 2)     Status: None   Collection Time: 12/12/16  1:59 AM  Result Value Ref Range Status   Specimen Description BLOOD LEFT WRIST  Final   Special Requests   Final    BOTTLES DRAWN AEROBIC AND ANAEROBIC Blood Culture adequate volume   Culture NO GROWTH 5 DAYS  Final   Report Status 12/17/2016 FINAL  Final  Culture, blood (single)     Status: None   Collection Time: 12/12/16  6:23 AM  Result Value Ref Range Status   Specimen Description BLOOD LEFT ARM  Final   Special Requests IN PEDIATRIC BOTTLE Blood Culture adequate volume  Final   Culture NO GROWTH 5 DAYS  Final   Report Status 12/17/2016 FINAL  Final  MRSA PCR Screening     Status: None   Collection Time: 12/12/16  7:03 AM  Result Value Ref Range Status   MRSA by  PCR NEGATIVE NEGATIVE Final    Comment:        The GeneXpert MRSA Assay (FDA approved for NASAL specimens only), is one component of a comprehensive MRSA colonization surveillance program. It is not intended to diagnose MRSA infection nor to guide or monitor treatment for MRSA infections.   Urine culture     Status: None   Collection Time: 12/12/16 10:54 PM  Result Value Ref Range Status   Specimen Description URINE, RANDOM  Final   Special Requests NONE  Final   Culture NO GROWTH  Final   Report Status 12/14/2016 FINAL  Final  Culture, respiratory (NON-Expectorated)     Status: Abnormal   Collection Time: 12/15/16  2:54 PM  Result Value Ref Range Status   Specimen Description BRONCHIAL ALVEOLAR LAVAGE  Final   Special Requests RLL  Final   Gram Stain   Final    ABUNDANT WBC PRESENT, PREDOMINANTLY PMN NO ORGANISMS SEEN    Culture  MULTIPLE ORGANISMS PRESENT, NONE PREDOMINANT (A)  Final   Report Status 12/17/2016 FINAL  Final     Scheduled Meds: . chlorhexidine gluconate (MEDLINE KIT)  15 mL Mouth Rinse BID  . clonazePAM  0.5 mg Per Tube Daily  . diphenhydrAMINE  25 mg Per Tube BID  . famotidine  20 mg Per Tube BID  . fludrocortisone  0.2 mg Per Tube Daily  . glycopyrrolate  0.1 mg Intravenous TID  . heparin  5,000 Units Subcutaneous Q8H  . insulin aspart  0-9 Units Subcutaneous Q4H  . levothyroxine  75 mcg Per Tube QAC breakfast  . mouth rinse  15 mL Mouth Rinse QID  . QUEtiapine  100 mg Per Tube QHS  . QUEtiapine  50 mg Per Tube Daily  . sertraline  50 mg Per Tube Daily  . thiamine  100 mg Per Tube Daily     LOS: 7 days   Cherene Altes, MD [facility] Office  [phone] Pager - Text Page per Amion as per below:  On-Call/Text Page:      [email or web address]      password [email or web address]  If 7PM-7AM, please contact night-coverage [email or web address] Password [email or web address] 12/19/2016, 4:06 PM

## 2016-12-19 NOTE — Progress Notes (Signed)
12/19/2016 Rn notice patient had jugular vein distention on right side of neck and swelling on the left side of neck at 1825. Patient saturation was in the 90's no problem with breathing. Patient blood pressure was 166/104. Dr Thereasa Solo was made aware. [facility] RN.

## 2016-12-20 ENCOUNTER — Inpatient Hospital Stay (HOSPITAL_COMMUNITY): Payer: PPO

## 2016-12-20 DIAGNOSIS — J9621 Acute and chronic respiratory failure with hypoxia: Secondary | ICD-10-CM

## 2016-12-20 DIAGNOSIS — R0902 Hypoxemia: Secondary | ICD-10-CM

## 2016-12-20 DIAGNOSIS — Z930 Tracheostomy status: Secondary | ICD-10-CM

## 2016-12-20 LAB — CBC
HCT: 32.2 % — ABNORMAL LOW (ref 39.0–52.0)
HEMOGLOBIN: 10.4 g/dL — AB (ref 13.0–17.0)
MCH: 27 pg (ref 26.0–34.0)
MCHC: 32.3 g/dL (ref 30.0–36.0)
MCV: 83.6 fL (ref 78.0–100.0)
Platelets: 448 10*3/uL — ABNORMAL HIGH (ref 150–400)
RBC: 3.85 MIL/uL — AB (ref 4.22–5.81)
RDW: 17.3 % — ABNORMAL HIGH (ref 11.5–15.5)
WBC: 9 10*3/uL (ref 4.0–10.5)

## 2016-12-20 LAB — GLUCOSE, CAPILLARY
GLUCOSE-CAPILLARY: 104 mg/dL — AB (ref 65–99)
GLUCOSE-CAPILLARY: 117 mg/dL — AB (ref 65–99)
GLUCOSE-CAPILLARY: 105 mg/dL — AB (ref 65–99)
Glucose-Capillary: 111 mg/dL — ABNORMAL HIGH (ref 65–99)
Glucose-Capillary: 114 mg/dL — ABNORMAL HIGH (ref 65–99)
Glucose-Capillary: 124 mg/dL — ABNORMAL HIGH (ref 65–99)

## 2016-12-20 LAB — BASIC METABOLIC PANEL
ANION GAP: 8 (ref 5–15)
BUN: 19 mg/dL (ref 6–20)
CHLORIDE: 102 mmol/L (ref 101–111)
CO2: 27 mmol/L (ref 22–32)
Calcium: 8.3 mg/dL — ABNORMAL LOW (ref 8.9–10.3)
Creatinine, Ser: 0.62 mg/dL (ref 0.61–1.24)
GFR calc non Af Amer: >60 mL/min (ref >60)
Glucose, Bld: 102 mg/dL — ABNORMAL HIGH (ref 65–99)
POTASSIUM: 3.5 mmol/L (ref 3.5–5.1)
Sodium: 137 mmol/L (ref 135–145)

## 2016-12-20 LAB — URINALYSIS, ROUTINE W REFLEX MICROSCOPIC
BILIRUBIN URINE: NEGATIVE
GLUCOSE, UA: NEGATIVE mg/dL
HGB URINE DIPSTICK: NEGATIVE
KETONES UR: NEGATIVE mg/dL
Nitrite: NEGATIVE
PROTEIN: 30 mg/dL — AB
SQUAMOUS EPITHELIAL / LPF: NONE SEEN
Specific Gravity, Urine: 1.018 (ref 1.005–1.030)
pH: 7 (ref 5.0–8.0)

## 2016-12-20 LAB — LACTIC ACID, PLASMA: Lactic Acid, Venous: 1 mmol/L (ref 0.5–1.9)

## 2016-12-20 MED ORDER — FREE WATER
200.0000 mL | Freq: Three times a day (TID) | Status: DC
  Administered 2016-12-20 – 2016-12-21 (×3): 200 mL

## 2016-12-20 MED ORDER — POTASSIUM CHLORIDE 20 MEQ/15ML (10%) PO SOLN
40.0000 meq | Freq: Every day | ORAL | Status: AC
  Administered 2016-12-20 – 2016-12-21 (×2): 40 meq
  Filled 2016-12-20 (×2): qty 30

## 2016-12-20 MED ORDER — BISACODYL 10 MG RE SUPP: 10.0000 mg | Freq: Every day | RECTAL | Status: DC | PRN

## 2016-12-20 MED ORDER — VANCOMYCIN HCL 10 G IV SOLR
1250.0000 mg | Freq: Once | INTRAVENOUS | Status: AC
  Administered 2016-12-20: 1250 mg via INTRAVENOUS
  Filled 2016-12-20: qty 1250

## 2016-12-20 MED ORDER — VANCOMYCIN HCL IN DEXTROSE 750-5 MG/150ML-% IV SOLN
750.0000 mg | Freq: Two times a day (BID) | INTRAVENOUS | Status: DC
  Administered 2016-12-21 – 2016-12-22 (×3): 750 mg via INTRAVENOUS
  Filled 2016-12-20 (×4): qty 150

## 2016-12-20 NOTE — Progress Notes (Addendum)
[place] TEAM 1 - Stepdown/ICU TEAM  PACEN WATFORD  AOZ:[id number] DOB: 06-24-1950 DOA: 12/12/2016 PCP: Patient, [name]    Brief Narrative:  66yo M with history of Guillain-Barre Syndrome (06/2016) for which he has a Tracheostomy and PEG and is on Chronic Mechanical ventilation and is living at [facility]. He also has a h/o Anxiety, OA, Chronic pain, DM, HTN, PAD, and Orthostatic hypotension. He was transferred from [facility] to the ER on 8/12 w/ Fever and AMS. On arrival to the ER he was in shock with BP 50/30. He had foul smelling urine, a large unstageable ulcer on his foot, and pulmonary infiltrates on CXR.  Significant Events: 8/12 admit to [facility] ICU 8/15 [facility] assumed care   Subjective: The patient appears to be more stable on the vent today but he has been spiking fevers again over the last 12-24 hours as high as 102.6.  There is no clear source of infection at the present time.  He will open his eyes to my voice but otherwise remains noncommunicative.   Assessment & Plan:  Septic shock due to Pseudomonas HCAP shock resolved - BP recovered   Recurrent FUO Pt now having recurring fevers as high as 102.6 while on Levaquin - pan culture and repeat CXR - no clear source on physical exam - ?central mediated/autonomic dysreflexia  Acute-on-Chronic Hypoxic respiratory failure chronic vent since GBS 06/2016 - typically on 35% FIO2 - ongoing vent care per PCCM - approaching his usual O2 dose   Anemia  baseline Hgb 8-10 - no clear blood loss - ?simply dilutional - transfused 2U PRBC since admit - stable hgb at this time   Recent Labs Lab 12/16/16 0447 12/16/16 1819 12/18/16 0345 12/19/16 0301 12/20/16 0310  HGB 7.8* 10.0* 9.2* 9.8* 10.4*    Acute v/s Chronic Encephalopathy presumed due to sepsis initially but now appears this is his baseline  Hx Orthostatic Hypotension on fludrocortisone chronically - this is being continued - lytes stable   AKI resolved  8/13  Hypokalemia  Cont to supplement to goal of 4.0  Severe malnutrition in context of chronic illness PEG tube dependent - continue TF  Unstageable Foot Ulcer  Continue dressing changes and pressure relief boots - not felt to be infected   DM CBG controlled   Hx hypothyroidism Continue synthroid - TSH not at goal, but pt acutely ill - will not change dose for now - recheck TSH when clinically at baseline   DVT prophylaxis: lovenox  Code Status: FULL CODE Family Communication: no family present at time of exam  Disposition Plan: vent capable SDU - w/ FUO no longer ready for LTACH   Consultants:  PCCM  Antimicrobials:  Vancomycin 8/12 > 8/15 Aztreonam 8/11 > 8/12 Ceftazidime 8/12 > 8/15 Levaquin 8/15 >  Objective: Blood pressure (!) 153/101, pulse (!) 101, temperature 99.2 F (37.3 C), temperature source Oral, resp. rate (!) 26, height '6\' 2"'  (1.88 m), weight 64.8 kg (142 lb 13.7 oz), SpO2 100 %.  Intake/Output Summary (Last 24 hours) at 12/20/16 1604 Last data filed at 12/20/16 1219  Gross per 24 hour  Intake             1320 ml  Output             1725 ml  Net             -405 ml   Filed Weights   12/18/16 0500 12/19/16 0313 12/20/16 0318  Weight: 65 kg (  143 lb 4.8 oz) 65.8 kg (145 lb 1 oz) 64.8 kg (142 lb 13.7 oz)    Examination: General: respirations more calm - cooperating w/ vent  Lungs: no focal crackles - no wheezing  Cardiovascular: mild tachycardia - no M appreciated  Abdomen: ND, soft, bowel sounds hypoactive Extremities: No edema B LE - heel boots in place B   CBC:  Recent Labs Lab 12/14/16 0556 12/15/16 0558  12/16/16 0447 12/16/16 1819 12/18/16 0345 12/19/16 0301 12/20/16 0310  WBC 16.1* 6.0  --  7.9  --  5.6 8.2 9.0  NEUTROABS 14.2* 4.6  --   --   --   --   --   --   HGB 7.4* 6.6*  < > 7.8* 10.0* 9.2* 9.8* 10.4*  HCT 23.3* 20.3*  < > 24.0* 30.0* 28.2* 29.8* 32.2*  MCV 86.3 86.4  --  83.3  --  85.5 84.2 83.6  PLT 262 277  --  279   --  331 398 448*  < > = values in this interval not displayed. Basic Metabolic Panel:  Recent Labs Lab 12/14/16 0556  12/16/16 0447 12/16/16 1449 12/17/16 0942 12/18/16 0345 12/19/16 0301 12/20/16 0310  NA 141  < > 141  --  139 141 139 137  K 3.3*  < > 3.1* 3.6 3.3* 3.5 3.7 3.5  CL 111  < > 106  --  103 105 104 102  CO2 25  < > 28  --  '26 30 27 27  ' GLUCOSE 121*  < > 138*  --  119* 112* 112* 102*  BUN 29*  < > 13  --  17 21* 18 19  CREATININE 0.62  < > 0.56*  --  0.49* 0.54* 0.58* 0.62  CALCIUM 8.5*  < > 8.4*  --  8.4* 8.6* 8.6* 8.3*  MG 2.0  --  1.5* 2.0 1.7 1.8  --   --   PHOS 2.4*  --   --   --   --   --   --   --   < > = values in this interval not displayed. GFR: Estimated Creatinine Clearance: 83.3 mL/min (by C-G formula based on SCr of 0.62 mg/dL).  Liver Function Tests:  Recent Labs Lab 12/16/16 0447  AST 22  ALT 31  ALKPHOS 90  BILITOT 0.7  PROT 5.9*  ALBUMIN 2.0*    HbA1C: Hgb A1c MFr Bld  Date/Time Value Ref Range Status  06/19/2016 05:00 AM 5.7 (H) 4.8 - 5.6 % Final    Comment:    (NOTE)         Pre-diabetes: 5.7 - 6.4         Diabetes: >6.4         Glycemic control for adults with diabetes: <7.0   09/17/2013 06:34 PM 6.5 (H) <5.7 % Final    Comment:    (NOTE)                                                                       According to the ADA Clinical Practice Recommendations for 2011, when HbA1c is used as a screening test:  >=6.5%   Diagnostic of Diabetes Mellitus           (  if abnormal result is confirmed) 5.7-6.4%   Increased risk of developing Diabetes Mellitus References:Diagnosis and Classification of Diabetes Mellitus,Diabetes KLKJ,1791,50(VWPVX 1):S62-S69 and Standards of Medical Care in         Diabetes - 2011,Diabetes YIAX,6553,74 (Suppl 1):S11-S61.    CBG:  Recent Labs Lab 12/19/16 2003 12/19/16 2347 12/20/16 0356 12/20/16 0813 12/20/16 1207  GLUCAP 116* 159* 104* 117* 111*    Recent Results (from the past 240  hour(s))  Culture, respiratory (NON-Expectorated)     Status: None   Collection Time: 12/12/16  1:28 AM  Result Value Ref Range Status   Specimen Description TRACHEAL ASPIRATE  Final   Special Requests NONE  Final   Gram Stain   Final    ABUNDANT WBC PRESENT, PREDOMINANTLY PMN RARE SQUAMOUS EPITHELIAL CELLS PRESENT MODERATE GRAM POSITIVE COCCI IN PAIRS FEW GRAM VARIABLE ROD    Culture   Final    MODERATE PSEUDOMONAS AERUGINOSA MODERATE GROUP B STREP(S.AGALACTIAE)ISOLATED TESTING AGAINST S. AGALACTIAE NOT ROUTINELY PERFORMED DUE TO PREDICTABILITY OF AMP/PEN/VAN SUSCEPTIBILITY.    Report Status 12/15/2016 FINAL  Final   Organism ID, Bacteria PSEUDOMONAS AERUGINOSA  Final      Susceptibility   Pseudomonas aeruginosa - MIC*    CEFTAZIDIME 16 INTERMEDIATE Intermediate     CIPROFLOXACIN <=0.25 SENSITIVE Sensitive     GENTAMICIN 2 SENSITIVE Sensitive     IMIPENEM >=16 RESISTANT Resistant     CEFEPIME INTERMEDIATE Intermediate     * MODERATE PSEUDOMONAS AERUGINOSA  Blood Culture (routine x 2)     Status: None   Collection Time: 12/12/16  1:30 AM  Result Value Ref Range Status   Specimen Description BLOOD LEFT ANTECUBITAL  Final   Special Requests   Final    BOTTLES DRAWN AEROBIC AND ANAEROBIC Blood Culture adequate volume   Culture NO GROWTH 5 DAYS  Final   Report Status 12/17/2016 FINAL  Final  Blood Culture (routine x 2)     Status: None   Collection Time: 12/12/16  1:59 AM  Result Value Ref Range Status   Specimen Description BLOOD LEFT WRIST  Final   Special Requests   Final    BOTTLES DRAWN AEROBIC AND ANAEROBIC Blood Culture adequate volume   Culture NO GROWTH 5 DAYS  Final   Report Status 12/17/2016 FINAL  Final  Culture, blood (single)     Status: None   Collection Time: 12/12/16  6:23 AM  Result Value Ref Range Status   Specimen Description BLOOD LEFT ARM  Final   Special Requests IN PEDIATRIC BOTTLE Blood Culture adequate volume  Final   Culture NO GROWTH 5 DAYS   Final   Report Status 12/17/2016 FINAL  Final  MRSA PCR Screening     Status: None   Collection Time: 12/12/16  7:03 AM  Result Value Ref Range Status   MRSA by PCR NEGATIVE NEGATIVE Final    Comment:        The GeneXpert MRSA Assay (FDA approved for NASAL specimens only), is one component of a comprehensive MRSA colonization surveillance program. It is not intended to diagnose MRSA infection nor to guide or monitor treatment for MRSA infections.   Urine culture     Status: None   Collection Time: 12/12/16 10:54 PM  Result Value Ref Range Status   Specimen Description URINE, RANDOM  Final   Special Requests NONE  Final   Culture NO GROWTH  Final   Report Status 12/14/2016 FINAL  Final  Culture, respiratory (NON-Expectorated)  Status: Abnormal   Collection Time: 12/15/16  2:54 PM  Result Value Ref Range Status   Specimen Description BRONCHIAL ALVEOLAR LAVAGE  Final   Special Requests RLL  Final   Gram Stain   Final    ABUNDANT WBC PRESENT, PREDOMINANTLY PMN NO ORGANISMS SEEN    Culture MULTIPLE ORGANISMS PRESENT, NONE PREDOMINANT (A)  Final   Report Status 12/17/2016 FINAL  Final     Scheduled Meds: . chlorhexidine gluconate (MEDLINE KIT)  15 mL Mouth Rinse BID  . clonazePAM  0.5 mg Per Tube Daily  . diphenhydrAMINE  25 mg Per Tube BID  . famotidine  20 mg Per Tube BID  . fludrocortisone  0.2 mg Per Tube Daily  . glycopyrrolate  0.1 mg Intravenous TID  . heparin  5,000 Units Subcutaneous Q8H  . insulin aspart  0-9 Units Subcutaneous Q4H  . levothyroxine  75 mcg Per Tube QAC breakfast  . mouth rinse  15 mL Mouth Rinse QID  . QUEtiapine  100 mg Per Tube QHS  . QUEtiapine  50 mg Per Tube Daily  . sertraline  50 mg Per Tube Daily  . thiamine  100 mg Per Tube Daily     LOS: 8 days   Cherene Altes, MD [facility] Office  [phone] Pager - Text Page per Amion as per below:  On-Call/Text Page:      [email or web address]      password [email or web address]  If 7PM-7AM,  please contact night-coverage [email or web address] Password [email or web address] 12/20/2016, 4:04 PM

## 2016-12-20 NOTE — Progress Notes (Signed)
Nutrition Follow Up  DOCUMENTATION CODES:   Severe malnutrition in context of chronic illness  INTERVENTION:    Continue Vital AF 1.2 at 60 ml/h (1440 ml per day)  TF regimen providing 1728 kcal, 108 gm protein, 1168 ml free water daily  NUTRITION DIAGNOSIS:   Malnutrition (severe) related to chronic illness (Guillain Barre with trach/vent) as evidenced by severe depletion of body fat, severe depletion of muscle mass, ongoing  GOAL:   Patient will meet greater than or equal to 90% of their needs, met  MONITOR:   Vent status, TF tolerance, Labs, Skin, I & O's  ASSESSMENT:   66 yo male with PMH of depression, HTN, HLD, DM, arthritis, PAD, and Guillain Barre syndrome, with trach (chronic vent), and PEG who was admitted on 8/12 with fever and AMS related to shock.  Patient is currently on ventilator support >> trach  MV: 11 L/min Temp (24hrs), Avg:100.2 F (37.9 C), Min:98.5 F (36.9 C), Max:102.6 F (39.2 C)  Vital AF 1.2 formula infusing at goal rate of 60 ml/hr via PEG tube. PCCM note reviewed. Pt with increased tracheal secretions. Septic shock due to pseudomonas HCAP resolved. Medications reviewed and include thiamine. Labs reviewed. CBG's [id number].  Diet Order:  Diet NPO time specified  Skin:  Wound (see comment) (unstageable R foot; stage I sacrum)  Last BM:  8/20  Height:   Ht Readings from Last 1 Encounters:  12/12/16 '6\' 2"'  (1.88 m)    Weight >> stable  Wt Readings from Last 1 Encounters:  12/20/16 142 lb 13.7 oz (64.8 kg)    Ideal Body Weight:  86.4 kg  BMI:  Body mass index is 18.34 kg/m.  Estimated Nutritional Needs:   Kcal:  1700-1900  Protein:  100-115 gm  Fluid:  1.7-1.9 L  EDUCATION NEEDS:   No education needs identified at this time  Arthur Holms, RD, LDN Pager #: [phone] After-Hours Pager #: [phone]

## 2016-12-20 NOTE — Progress Notes (Signed)
Name: Marc Rose MRN: [id number] DOB: 11-Jan-1951    ADMISSION DATE:  12/12/2016 CONSULTATION DATE: 12/12/16  REFERRING MD: Dr Roxanne Mins (ER)  CHIEF COMPLAINT: Septic shock  HISTORY OF PRESENT ILLNESS:   [name] with history of Guillain-Barre Syndrome (06/2016), for which he has a Tracheostomy and PEG and is on Chronic Mechanical ventilation since then, most recently at [facility]. He also has h/o Anxiety, OA, Chronic pain, DM, HTN, PAD, and Orthostatic hypotension (for which he receives fludrocortisone 0.46m daily). [facility]. On arrival to the ER he was in shock with BP 50/30. He is unresponsive but unclear what his baseline mental status is. He has foul smelling urine, large unstageable ulcer on foot, and pulmonary infiltrates on CXR. In the ER he was given 30cc/kg IVF bolus, started on broad spectrum antibiotics, and Levophed. No family is present at the time of my exam. Patient withdraws to pain but does not obey commands or attempt to communicate.   CULTURES: Blood cultures (8/12): > Sputum culture (8/12): >pseudo, agalactai Urine culture (8/12): >  BAL 8/15 >  ANTIBIOTICS: Vancomycin 8/12 >>>8/15 Aztreonam 8/12 > 8/12 Ceftazidime 8/12 >>>8/15 Levofloxacin 8/15>>>   LINES/TUBES: 6.0 Distal XLT Cuffed Shiley Trach (unclear when it was placed) RUE Single Lumen PICC (from [facility] Foley catheter (from [facility])*  SUBJECTIVE:  No sig change over weekend.  Febrile 101.4.  Increased tracheal secretions  VITAL SIGNS: Temp:  [98.5 F (36.9 C)-102.6 F (39.2 C)] 101.4 F (38.6 C) (08/20 0917) Pulse Rate:  [80-109] 80 (08/20 1122) Resp:  [17-35] 17 (08/20 1122) BP: (113-176)/(76-116) 117/77 (08/20 1122) SpO2:  [100 %] 100 % (08/20 1122) FiO2 (%):  [40 %] 40 % (08/20 1122) Weight:  [64.8 kg (142 lb 13.7 oz)] 64.8 kg (142 lb 13.7 oz) (08/20 0318)  PHYSICAL EXAMINATION: General:  Frail, chronically ill  appearing male Neuro:  Sleeping, appears comfortable HEENT:  Mm moist, trach c/d  Cardiovascular:  s1s2 rrr Lungs:  resps even non labored on vent, getting chest vest  Abdomen:  Round, soft, non tender  Musculoskeletal:  Warm and dry, no edema    Recent Labs Lab 12/18/16 0345 12/19/16 0301 12/20/16 0310  NA 141 139 137  K 3.5 3.7 3.5  CL 105 104 102  CO2 _0 BUN 21* 18 19  CREATININE 0.54* 0.58* 0.62  GLUCOSE 112* 112* 102*    Recent Labs Lab 12/18/16 0345 12/19/16 0301 12/20/16 0310  HGB 9.2* 9.8* 10.4*  HCT 28.2* 29.8* 32.2*  WBC 5.6 8.2 9.0  PLT 331 398 448*   No results found.  ASSESSMENT / PLAN:  89yoMwith history of Guillain-Barre Syndrome (06/2016), for which he has a Tracheostomy and PEG and is on Chronic Mechanical ventilation since then. He also has h/o Anxiety, OA, Chronic pain, DM, HTN, PAD, and Orthostatic hypotension (for which he received fludrocortisone 0.231mdaily). [facility]. On arrival to the ER he was in shock with BP 50/30. He is unresponsive but unclear what his baseline mental status is. He has foul smelling urine, large unstageable ulcer on foot, and pulmonary infiltrates on CXR. In the ER he was given 30cc/kg IVF bolus, started on broad spectrum antibiotics, and Levophed.   PULMONARY Acute-on-Chronic Hypoxic respiratory failure - on chronic vent since GBS diagnosis in 06/2016. Typically is on 35% FIO2, now requiring 40% FIO2. Plan: Full  vent support PEEP to 5 Chest PT Mucomyst complete  No trach collar trials Keep baseline settings SBT in AM - goal PS for now    HCAP > pseudomonas Continue levaquin  Consider repeat culture if ongoing fevers, multiple organisms on BAL   Other issues per primary team  PCCM will f/u twice weekly for trach/vent care   Nickolas Madrid, NP 12/20/2016  11:41 AM Pager: [phone] or [phone]  Attending Note:  66 year old male  with GBS with chronic vent via trach, [facility] resident.  Here with HCAP.  On exam, coarse BS diffusely.  I reviewed CXR myself, infiltrate and trach noted.  Discussed with PCCM-NP.  A on C resp failure:  - Maintain on full vent support  Hypoxemia:  - Titrate O2 for sat of 88-92%.  Trach status:  - Change trach per schedule.  - Keep cuffed trach  HCAP: pseudomonas   - Levaquin  PCCM will follow.  Patient seen and examined, agree with above note.  I dictated the care and orders written for this patient under my direction.  Rush Farmer, Arthur

## 2016-12-20 NOTE — Progress Notes (Signed)
Pharmacy Antibiotic Note  Marc Rose is a 66 y.o. male admitted on 12/12/2016 with fever and AMS.  Pharmacy has been consulted to restart vancomycin given new fevers currently on levaquin.  Plan: Reload patient with 1240m IV x1 of vancomycin Then start vancomycin 7561mIV q12 hours Bmet ordered for tomorrow   Height: _0  (188 cm) Weight: 142 lb 13.7 oz (64.8 kg) IBW/kg (Calculated) : 82.2  Temp (24hrs), Avg:100.4 F (38 C), Min:98.5 F (36.9 C), Max:102.7 F (39.3 C)   Recent Labs Lab 12/15/16 0558 12/16/16 0447 12/17/16 0942 12/18/16 0345 12/19/16 0301 12/20/16 0310  WBC 6.0 7.9  --  5.6 8.2 9.0  CREATININE 0.56* 0.56* 0.49* 0.54* 0.58* 0.62  LATICACIDVEN  --   --   --   --   --  1.0    Estimated Creatinine Clearance: 83.3 mL/min (by C-G formula based on SCr of 0.62 mg/dL).    Allergies  Allergen Reactions  . Codeine Anaphylaxis, Hives and Swelling  . Penicillins Anaphylaxis, Hives and Swelling  . Latex Itching  . Strawberry Extract Hives    Antimicrobials this admission: 8/12 vanc >> 8/15, 8/20>> 8/12 ceftaz >> 8/15 8/12 aztreonam >> 8/12 8/15 levaquin>>[8/29]  Microbiology results: 8/12 BCx: NGTD x4 days 8/12 tracheal asp: PSA (intermediate to ceftaz, sens to FQ), GBS 8/12 MRSA PCR: neg 8/12 strep pneumo: negative  8/12 legionella: negative 8/12 UCx: negative 8/15 BAL: multiple orgs, none predominant  Thank you for allowing pharmacy to be a part of this patient's care.  FrErin HearingharmD., BCPS Clinical Pharmacist Pager [phone]/20/2018 7:01 PM

## 2016-12-20 NOTE — Care Management Important Message (Signed)
Important Message  Patient Details  Name: Marc Rose MRN: [id number] Date of Birth: [birth year]   Medicare Important Message Given:  Yes    [name] [name] 12/20/2016, 11:15 AM

## 2016-12-20 NOTE — Evaluation (Signed)
Physical Therapy Evaluation Patient Details Name: Marc Rose MRN: [id number] DOB: 1951-02-05 Today's Date: 12/20/2016   History of Present Illness  Patient is a 66 y/o male who presents from [place] with Fever and AMS. On arrival, pt was in shock with BP 50/30, foul smelling urine, large unstageable ulcer on foot, and pulmonary infiltrates on CXR. Admitted with septic shock due to multiple sources of infection. PMH includes Guillain-Barre Syndrome (06/2016), for which he has a Trach/PEG and is on Chronic Mechanical ventilation, chronic pain, PAD, DM, orthostatic hypotension, HTN.   Clinical Impression  Patient from [facility] on chronic mechanical ventilation at baseline and not able to provide any information on mobility status at facility. Pt follows simple 1 step commands inconsistently with repetition and multimodal cues. Able to wiggle toes and attempt to open eyes but otherwise not interacting with therapist. Some tone noted on RUE/LE during assessment. Anticipate pt is functioning close to baseline and requires total A for all care. Pt not interacting or engaging much during session and not appropriate for PT services at this time. Will sign off for now. Please re-consult when/if pt can participate in therapy. Discharge from therapy.    Follow Up Recommendations No PT follow up;LTACH    Equipment Recommendations  None recommended by PT    Recommendations for Other Services       Precautions / Restrictions Precautions Precautions: Fall Precaution Comments: Prevalon boots Restrictions Weight Bearing Restrictions: No      Mobility  Bed Mobility Overal bed mobility: Needs Assistance Bed Mobility: Rolling Rolling: Total assist;+2 for physical assistance         General bed mobility comments: Requires assist of 2 to roll right/left. Eyes remained closed. No assist provided.   Transfers                    Ambulation/Gait                 Stairs            Wheelchair Mobility    Modified Rankin (Stroke Patients Only)       Balance Overall balance assessment: No apparent balance deficits (not formally assessed)                                           Pertinent Vitals/Pain Pain Assessment: Faces Faces Pain Scale: No hurt    Home Living Family/patient expects to be discharged to:: Skilled nursing facility                 Additional Comments: From [facility] on mechanical ventilation at baseline    Prior Function           Comments: Pt not alert enough to be able to nod yes or no and ventilated.     Hand Dominance        Extremity/Trunk Assessment   Upper Extremity Assessment Upper Extremity Assessment: Defer to OT evaluation;Generalized weakness;LUE deficits/detail;RUE deficits/detail RUE Deficits / Details: Extensor tone noted in RUE but able to passively move through almost entire range into elbow flexion. Closed grip with foul smelling odor when hand was opened. LUE Deficits / Details: No activation of LUE noted during PROM.     Lower Extremity Assessment Lower Extremity Assessment: Generalized weakness;LLE deficits/detail;RLE deficits/detail (Difficult to assess due to impaired cognition. ) RLE Deficits / Details: Able to wiggle toes,  observed spontaneously moving LE into knee flexion minimally and crossing legs but not on command. LLE Deficits / Details: Able to wiggle toes, observed spontaneously moving LE into knee flexion minimally and crossing legs but not on command. Appeared to be some extensor tone in RLE as well but hard to tell.        Communication   Communication: Tracheostomy  Cognition Arousal/Alertness: Lethargic Behavior During Therapy: Flat affect Overall Cognitive Status: Difficult to assess                                 General Comments: Pt follows simple 1 step commands inconsistently with repetition and multimodal  cues. Eyes closed for whole session despite cues to open them. Wiggled toes.       General Comments      Exercises     Assessment/Plan    PT Assessment Patent does not need any further PT services  PT Problem List Decreased strength;Decreased mobility;Impaired tone;Decreased cognition;Cardiopulmonary status limiting activity;Decreased skin integrity;Decreased activity tolerance;Decreased range of motion;Decreased safety awareness       PT Treatment Interventions      PT Goals (Current goals can be found in the Care Plan section)  Acute Rehab PT Goals PT Goal Formulation: All assessment and education complete, DC therapy    Frequency     Barriers to discharge        Co-evaluation               AM-PAC PT "6 Clicks" Daily Activity  Outcome Measure Difficulty turning over in bed (including adjusting bedclothes, sheets and blankets)?: Unable Difficulty moving from lying on back to sitting on the side of the bed? : Unable Difficulty sitting down on and standing up from a chair with arms (e.g., wheelchair, bedside commode, etc,.)?: Unable Help needed moving to and from a bed to chair (including a wheelchair)?: Total Help needed walking in hospital room?: Total Help needed climbing 3-5 steps with a railing? : Total 6 Click Score: 6    End of Session Equipment Utilized During Treatment: Other (comment) (trach/vent) Activity Tolerance: Patient limited by lethargy Patient left: in bed;with call bell/phone within reach;with bed alarm set;with SCD's reapplied Nurse Communication: Need for lift equipment PT Visit Diagnosis: Other symptoms and signs involving the nervous system (R29.898);Muscle weakness (generalized) (M62.81)    Time: 1140-1150 PT Time Calculation (min) (ACUTE ONLY): 10 min   Charges:   PT Evaluation $PT Eval High Complexity: 1 High     PT G CodesWray Kearns, PT, DPT [phone]    Marguarite Arbour A Palmer Fahrner 12/20/2016, 12:02 PM

## 2016-12-21 DIAGNOSIS — J151 Pneumonia due to Pseudomonas: Secondary | ICD-10-CM

## 2016-12-21 DIAGNOSIS — G9340 Encephalopathy, unspecified: Secondary | ICD-10-CM

## 2016-12-21 DIAGNOSIS — R509 Fever, unspecified: Secondary | ICD-10-CM

## 2016-12-21 LAB — COMPREHENSIVE METABOLIC PANEL
ALBUMIN: 2.3 g/dL — AB (ref 3.5–5.0)
ALT: 19 U/L (ref 17–63)
ANION GAP: 8 (ref 5–15)
AST: 19 U/L (ref 15–41)
Alkaline Phosphatase: 79 U/L (ref 38–126)
BUN: 19 mg/dL (ref 6–20)
CHLORIDE: 103 mmol/L (ref 101–111)
CO2: 26 mmol/L (ref 22–32)
Calcium: 8.5 mg/dL — ABNORMAL LOW (ref 8.9–10.3)
Creatinine, Ser: 0.67 mg/dL (ref 0.61–1.24)
GFR calc Af Amer: >60 mL/min (ref >60)
GFR calc non Af Amer: >60 mL/min (ref >60)
GLUCOSE: 122 mg/dL — AB (ref 65–99)
POTASSIUM: 3.6 mmol/L (ref 3.5–5.1)
SODIUM: 137 mmol/L (ref 135–145)
Total Bilirubin: 0.6 mg/dL (ref 0.3–1.2)
Total Protein: 6.6 g/dL (ref 6.5–8.1)

## 2016-12-21 LAB — GLUCOSE, CAPILLARY
GLUCOSE-CAPILLARY: 120 mg/dL — AB (ref 65–99)
GLUCOSE-CAPILLARY: 127 mg/dL — AB (ref 65–99)
GLUCOSE-CAPILLARY: 111 mg/dL — AB (ref 65–99)
Glucose-Capillary: 107 mg/dL — ABNORMAL HIGH (ref 65–99)
Glucose-Capillary: 113 mg/dL — ABNORMAL HIGH (ref 65–99)
Glucose-Capillary: 119 mg/dL — ABNORMAL HIGH (ref 65–99)

## 2016-12-21 LAB — CBC
HEMATOCRIT: 32.4 % — AB (ref 39.0–52.0)
HEMOGLOBIN: 10.8 g/dL — AB (ref 13.0–17.0)
MCH: 27.7 pg (ref 26.0–34.0)
MCHC: 33.3 g/dL (ref 30.0–36.0)
MCV: 83.1 fL (ref 78.0–100.0)
Platelets: 434 10*3/uL — ABNORMAL HIGH (ref 150–400)
RBC: 3.9 MIL/uL — ABNORMAL LOW (ref 4.22–5.81)
RDW: 17.1 % — AB (ref 11.5–15.5)
WBC: 8.4 10*3/uL (ref 4.0–10.5)

## 2016-12-21 MED ORDER — FREE WATER
200.0000 mL | Freq: Two times a day (BID) | Status: DC
  Administered 2016-12-21 – 2016-12-22 (×2): 200 mL

## 2016-12-21 MED ORDER — FUROSEMIDE 10 MG/ML IJ SOLN
60.0000 mg | Freq: Every day | INTRAMUSCULAR | Status: DC
  Administered 2016-12-21 – 2016-12-22 (×2): 60 mg via INTRAVENOUS
  Filled 2016-12-21 (×2): qty 6

## 2016-12-21 MED ORDER — SCOPOLAMINE 1 MG/3DAYS TD PT72
1.0000 | MEDICATED_PATCH | TRANSDERMAL | Status: DC
  Administered 2016-12-21: 1.5 mg via TRANSDERMAL
  Filled 2016-12-21: qty 1

## 2016-12-21 MED ORDER — FUROSEMIDE 10 MG/ML IJ SOLN: 60.0000 mg | Freq: Once | INTRAMUSCULAR | Status: DC

## 2016-12-21 NOTE — Progress Notes (Addendum)
  [facility] [place], [place]- [phone]- bed became available today  [facility] [place], [place] [phone]- left multiple messages without return  [facility], [place], [place]- [phone]- has made bed offer good through end of this week  [facility], [place], [place]- [phone]- made bed offer  CSW updated pt friend and sister- pt friend is main Media planner and is agreeable to [facility] and [facility] doesn't have a bed open up when pt is medically stable  Pt discussed in LOS this morning- [facility] rep thinks pt might be appropriate to pursue LTACH due to fever and high suctioning needs at this time- CSW informed RNCM who will need to contact insurance to see if pt will be approved.  CSW discussed with MD who does not think pt is stable for SNF transfer today- will await LTACH determination and see if pt is stable tomorrow  CSW will continue to follow  Jorge Ny, [place] Social Worker [phone]

## 2016-12-21 NOTE — Progress Notes (Signed)
PROGRESS NOTE    Marc Rose  WGY:[id number] DOB: [birth year] DOA: 12/12/2016 PCP: Patient, [name]   Brief Narrative:  66yo WM PMHx Anxiety,Depression, Guillain-Barre Syndrome (06/2016), Tracheostomy and PEG on Chronic Mechanical ventilation, OA, Chronic pain syndrome, DM Type 2, HTN, PAD, and Orthostatic Hypotension (for which he received fludrocortisone 0.92m daily). Tobacco abuse,    [facility]. On arrival to the ER he was in shock with BP 50/30. He is unresponsive but unclear what his baseline mental status is. He has foul smelling urine, large unstageable ulcer on foot, and pulmonary infiltrates on CXR. In the ER he was given 30cc/kg IVF bolus, started on broad spectrum antibiotics, and Levophed. No family is present at the time of my exam. Patient withdraws to pain but does not obey commands or attempt to communicate.     Subjective: 8/21 A/O 0 patient will withdraw to painful stimuli does not follow commands, opens eyes briefly. Tmax last 24 hours 39.3C   Assessment & Plan:   Principal Problem:   Septic shock ([place] Active Problems:   HTN (hypertension)   Peripheral arterial disease (HCC)   Peripheral neuropathic pain   Neurogenic urinary bladder disorder   Miller-Fisher variant Guillain-Barre syndrome (HCC)   Pressure injury of skin   Urinary tract infection without hematuria   HCAP (healthcare-associated pneumonia)   Sepsis due to undetermined organism (HCC)   Normochromic normocytic anemia   Protein-calorie malnutrition, severe   Septic shock due to positive Pseudomonas HCAP RLL -Complete 7 days antibiotics   Acute-on-Chronic Hypoxic respiratory failure -Multifactorial HCAP, CHF (fluid overload): Gentle diuresis see CHF -See septic shock -PCXR 8/20 continues to show patchy opacifications Rt >>Lt . On exam patient rhonchorous  -Frequent suctioning -Robinul 0.1 mg TID -Chronic vent since GBS 06/2016    -Continue copious secretions (Mucomyst + Robinul + scopolamine 1.5 mg transdermal)   FUO -Patient continues to spike fevers while on appropriate antibiotic: Autonomic dysreflexia? -Patient with 1/2 blood culture positive GPC however should be covered by current antibiotic.   Anemia(baseline Hgb 8-10)    Recent Labs Lab 12/16/16 0447 12/16/16 1819 12/18/16 0345 12/19/16 0301 12/20/16 0310 12/21/16 0126  HGB 7.8* 10.0* 9.2* 9.8* 10.4* 10.8*  -stable   Acute vs Chronic Encephalopathy -Unsure of patient's baseline, no improvement in cognition over past several days    Chronic diastolic CHF -Strict I&O since admission + 12.5 L -Daily weight Filed Weights   12/19/16 0313 12/20/16 0318 12/21/16 0335  Weight: 145 lb 1 oz (65.8 kg) 142 lb 13.7 oz (64.8 kg) 143 lb 15.4 oz (65.3 kg)  -Lasix 60 mg daily -Decrease free water to 204mBID -Transfuse for hemoglobin<8 -8/16 transfuse 1 unit PRBC   Pulmonary hypertension -See CHF   Orthostatic Hypotension -on fludrocortisone chronically   Acute renal failure  -Resolved   Nutrition -PEG tube dependent -Continue tube feed    Unstageable right Foot Ulcer  -Continue dressing change and space  boots    DM type II controlled without, complication -2/7/79emoglobin A1c = 5.7    Hypothyroidism -Synthroid 75 g daily -TSH not at goal but acutely ill continue current dose Synthroid. Recheck in 6 weeks   Hypokalemia -Potassium goal> 4 -Potassium 40 mEq daily   Hypomagnesemia -Magnesium goal> 2  Severe malnutrition  -Continue tube feed Vital AF 1.2 CAL, [id number]   Goals of care -8/21 PALLIATIVE CARE consult placed:Patient with severe protein calorie malnutrition, septic shock, acute on  chronic respiratory failure. Guillain-Barre Syndrome  CODE STATUS would be more appropriate is DO NOT RESUSCITATE, discussed short-term vs long-term goals of care     DVT prophylaxis: Lovenox Code Status: Full Family Communication:  None Disposition Plan: LTAC vs Vent SNF in next 24-48 hours?    Consultants:  [facility]  Procedures/Significant Events:     I have personally reviewed and interpreted all radiology studies and my findings are as above.  VENTILATOR SETTINGS: PRVC Vt= 668m Set rate: 15 FiO2: 40% PEEP: 5    Cultures 8/12 Tracheal Aspirate positive pseudomonas aeruginosa 8/12 Blood cultures: Negative final  8/12 Urine culture Negative final 8/12 strep pneumo/Legionella urine antigen negative 8/15 BAL: Positive multiple organisms none predominant 8/20 blood 1/2 Positive GPC     Antimicrobials: Anti-infectives    Start     Stop   12/21/16 0600  vancomycin (VANCOCIN) IVPB 750 mg/150 ml premix         12/20/16 1900  vancomycin (VANCOCIN) 1,250 mg in sodium chloride 0.9 % 250 mL IVPB     12/20/16 2057   12/15/16 1300  levofloxacin (LEVAQUIN) IVPB 750 mg     12/29/16 2359   12/12/16 2200  cefTAZidime (FORTAZ) 2 g in dextrose 5 % 50 mL IVPB  Status:  Discontinued     12/15/16 1200   12/12/16 1600  vancomycin (VANCOCIN) 500 mg in sodium chloride 0.9 % 100 mL IVPB  Status:  Discontinued     12/12/16 1449   12/12/16 1530  vancomycin (VANCOCIN) IVPB 750 mg/150 ml premix  Status:  Discontinued     12/15/16 0913   12/12/16 0800  aztreonam (AZACTAM) 2 g in dextrose 5 % 50 mL IVPB  Status:  Discontinued     12/12/16 1442   12/12/16 0400  aztreonam (AZACTAM) 2 g in dextrose 5 % 50 mL IVPB  Status:  Discontinued     12/12/16 0408   12/12/16 0400  vancomycin (VANCOCIN) IVPB 1000 mg/200 mL premix  Status:  Discontinued     12/12/16 0408   12/12/16 0145  levofloxacin (LEVAQUIN) IVPB 750 mg     12/12/16 0626   12/12/16 0145  aztreonam (AZACTAM) 2 g in dextrose 5 % 50 mL IVPB     12/12/16 0232   12/12/16 0145  vancomycin (VANCOCIN) IVPB 1000 mg/200 mL premix     12/12/16 0303       Devices    LINES / TUBES:      Continuous Infusions: . feeding supplement (VITAL AF 1.2 CAL) 1,000 mL  (12/20/16 2331)  . levofloxacin (LEVAQUIN) IV Stopped (12/20/16 1444)  . vancomycin 750 mg (12/21/16 0531)     Objective: Vitals:   12/21/16 0814 12/21/16 0816 12/21/16 1130 12/21/16 1132  BP: (!) 129/96 (!) 129/96 111/82 (!) 123/105  Pulse: 72 68 74 78  Resp: _0 Temp: 99.3 F (37.4 C) 99.3 F (37.4 C) 98.6 F (37 C)   TempSrc: Oral Oral Oral   SpO2: 100% 100% 100% 100%  Weight:      Height:        Intake/Output Summary (Last 24 hours) at 12/21/16 1248 Last data filed at 12/21/16 1102  Gross per 24 hour  Intake             2030 ml  Output             1900 ml  Net              130 ml  Filed Weights   12/19/16 0313 12/20/16 0318 12/21/16 0335  Weight: 145 lb 1 oz (65.8 kg) 142 lb 13.7 oz (64.8 kg) 143 lb 15.4 oz (65.3 kg)    Examination:  General: A/O 0, positive acute on chronic respiratory distress Neck:  Negative scars, masses, torticollis, lymphadenopathy, JVD, #6 tracheostomy in place copious secretions clear/white Lungs: diffuse rhonchi  Rt>> Lt, negative wheeze Cardiovascular: Regular rate and rhythm without murmur gallop or rub normal S1 and S2 Abdomen: negative abdominal pain, nondistended, positive soft, bowel sounds, no rebound, no ascites, no appreciable mass Extremities: No significant cyanosis, clubbing, or edema bilateral lower extremities, extreme muscle wasting Skin: Negative rashes, lesions, ulcers Psychiatric:  Unable to assess Central nervous system:  Patient withdraws mildly to painful stimuli. Unable to assess further .     Data Reviewed: Care during the described time interval was provided by me .  I have reviewed this patient's available data, including medical history, events of note, physical examination, and all test results as part of my evaluation.   CBC:  Recent Labs Lab 12/15/16 0558  12/16/16 0447 12/16/16 1819 12/18/16 0345 12/19/16 0301 12/20/16 0310 12/21/16 0126  WBC 6.0  --  7.9  --  5.6 8.2 9.0 8.4   NEUTROABS 4.6  --   --   --   --   --   --   --   HGB 6.6*  < > 7.8* 10.0* 9.2* 9.8* 10.4* 10.8*  HCT 20.3*  < > 24.0* 30.0* 28.2* 29.8* 32.2* 32.4*  MCV 86.4  --  83.3  --  85.5 84.2 83.6 83.1  PLT 277  --  279  --  331 398 448* 434*  < > = values in this interval not displayed. Basic Metabolic Panel:  Recent Labs Lab 12/16/16 0447 12/16/16 1449 12/17/16 0942 12/18/16 0345 12/19/16 0301 12/20/16 0310 12/21/16 0126  NA 141  --  139 141 139 137 137  K 3.1* 3.6 3.3* 3.5 3.7 3.5 3.6  CL 106  --  103 105 104 102 103  CO2 28  --  _0 GLUCOSE 138*  --  119* 112* 112* 102* 122*  BUN 13  --  17 21* _1 CREATININE 0.56*  --  0.49* 0.54* 0.58* 0.62 0.67  CALCIUM 8.4*  --  8.4* 8.6* 8.6* 8.3* 8.5*  MG 1.5* 2.0 1.7 1.8  --   --   --    GFR: Estimated Creatinine Clearance: 83.9 mL/min (by C-G formula based on SCr of 0.67 mg/dL). Liver Function Tests:  Recent Labs Lab 12/16/16 0447 12/21/16 0126  AST 22 19  ALT 31 19  ALKPHOS 90 79  BILITOT 0.7 0.6  PROT 5.9* 6.6  ALBUMIN 2.0* 2.3*   No results for input(s): LIPASE, AMYLASE in the last 168 hours. No results for input(s): AMMONIA in the last 168 hours. Coagulation Profile: No results for input(s): INR, PROTIME in the last 168 hours. Cardiac Enzymes: No results for input(s): CKTOTAL, CKMB, CKMBINDEX, TROPONINI in the last 168 hours. BNP (last 3 results) No results for input(s): PROBNP in the last 8760 hours. HbA1C: No results for input(s): HGBA1C in the last 72 hours. CBG:  Recent Labs Lab 12/20/16 2048 12/20/16 2355 12/21/16 0335 12/21/16 0812 12/21/16 1127  GLUCAP 114* 124* 113* 120* 107*   Lipid Profile: No results for input(s): CHOL, HDL, LDLCALC, TRIG, CHOLHDL, LDLDIRECT in the last 72 hours. Thyroid Function Tests: No results for input(s): TSH, T4TOTAL,  FREET4, T3FREE, THYROIDAB in the last 72 hours. Anemia Panel: No results for input(s): VITAMINB12, FOLATE, FERRITIN, TIBC, IRON,  RETICCTPCT in the last 72 hours. Urine analysis:    Component Value Date/Time   COLORURINE YELLOW 12/20/2016 1613   APPEARANCEUR CLOUDY (A) 12/20/2016 1613   LABSPEC 1.018 12/20/2016 1613   PHURINE 7.0 12/20/2016 1613   GLUCOSEU NEGATIVE 12/20/2016 1613   HGBUR NEGATIVE 12/20/2016 1613   BILIRUBINUR NEGATIVE 12/20/2016 1613   KETONESUR NEGATIVE 12/20/2016 1613   PROTEINUR 30 (A) 12/20/2016 1613   UROBILINOGEN 1.0 09/16/2013 2001   NITRITE NEGATIVE 12/20/2016 1613   LEUKOCYTESUR MODERATE (A) 12/20/2016 1613   Sepsis Labs: _0 (procalcitonin:4,lacticidven:4)  ) Recent Results (from the past 240 hour(s))  Culture, respiratory (NON-Expectorated)     Status: None   Collection Time: 12/12/16  1:28 AM  Result Value Ref Range Status   Specimen Description TRACHEAL ASPIRATE  Final   Special Requests NONE  Final   Gram Stain   Final    ABUNDANT WBC PRESENT, PREDOMINANTLY PMN RARE SQUAMOUS EPITHELIAL CELLS PRESENT MODERATE GRAM POSITIVE COCCI IN PAIRS FEW GRAM VARIABLE ROD    Culture   Final    MODERATE PSEUDOMONAS AERUGINOSA MODERATE GROUP B STREP(S.AGALACTIAE)ISOLATED TESTING AGAINST S. AGALACTIAE NOT ROUTINELY PERFORMED DUE TO PREDICTABILITY OF AMP/PEN/VAN SUSCEPTIBILITY.    Report Status 12/15/2016 FINAL  Final   Organism ID, Bacteria PSEUDOMONAS AERUGINOSA  Final      Susceptibility   Pseudomonas aeruginosa - MIC*    CEFTAZIDIME 16 INTERMEDIATE Intermediate     CIPROFLOXACIN <=0.25 SENSITIVE Sensitive     GENTAMICIN 2 SENSITIVE Sensitive     IMIPENEM >=16 RESISTANT Resistant     CEFEPIME INTERMEDIATE Intermediate     * MODERATE PSEUDOMONAS AERUGINOSA  Blood Culture (routine x 2)     Status: None   Collection Time: 12/12/16  1:30 AM  Result Value Ref Range Status   Specimen Description BLOOD LEFT ANTECUBITAL  Final   Special Requests   Final    BOTTLES DRAWN AEROBIC AND ANAEROBIC Blood Culture adequate volume   Culture NO GROWTH 5 DAYS  Final   Report Status  12/17/2016 FINAL  Final  Blood Culture (routine x 2)     Status: None   Collection Time: 12/12/16  1:59 AM  Result Value Ref Range Status   Specimen Description BLOOD LEFT WRIST  Final   Special Requests   Final    BOTTLES DRAWN AEROBIC AND ANAEROBIC Blood Culture adequate volume   Culture NO GROWTH 5 DAYS  Final   Report Status 12/17/2016 FINAL  Final  Culture, blood (single)     Status: None   Collection Time: 12/12/16  6:23 AM  Result Value Ref Range Status   Specimen Description BLOOD LEFT ARM  Final   Special Requests IN PEDIATRIC BOTTLE Blood Culture adequate volume  Final   Culture NO GROWTH 5 DAYS  Final   Report Status 12/17/2016 FINAL  Final  MRSA PCR Screening     Status: None   Collection Time: 12/12/16  7:03 AM  Result Value Ref Range Status   MRSA by PCR NEGATIVE NEGATIVE Final    Comment:        The GeneXpert MRSA Assay (FDA approved for NASAL specimens only), is one component of a comprehensive MRSA colonization surveillance program. It is not intended to diagnose MRSA infection nor to guide or monitor treatment for MRSA infections.   Urine culture     Status: None   Collection Time: 12/12/16 10:54  PM  Result Value Ref Range Status   Specimen Description URINE, RANDOM  Final   Special Requests NONE  Final   Culture NO GROWTH  Final   Report Status 12/14/2016 FINAL  Final  Culture, respiratory (NON-Expectorated)     Status: Abnormal   Collection Time: 12/15/16  2:54 PM  Result Value Ref Range Status   Specimen Description BRONCHIAL ALVEOLAR LAVAGE  Final   Special Requests RLL  Final   Gram Stain   Final    ABUNDANT WBC PRESENT, PREDOMINANTLY PMN NO ORGANISMS SEEN    Culture MULTIPLE ORGANISMS PRESENT, NONE PREDOMINANT (A)  Final   Report Status 12/17/2016 FINAL  Final  Culture, blood (routine x 2)     Status: None (Preliminary result)   Collection Time: 12/20/16  4:40 PM  Result Value Ref Range Status   Specimen Description BLOOD LEFT HAND  Final    Special Requests IN PEDIATRIC BOTTLE Blood Culture adequate volume  Final   Culture NO GROWTH < 24 HOURS  Final   Report Status PENDING  Incomplete  Culture, blood (routine x 2)     Status: None (Preliminary result)   Collection Time: 12/20/16  4:43 PM  Result Value Ref Range Status   Specimen Description BLOOD RIGHT HAND  Final   Special Requests IN PEDIATRIC BOTTLE Blood Culture adequate volume  Final   Culture NO GROWTH < 24 HOURS  Final   Report Status PENDING  Incomplete         Radiology Studies: Dg Chest Port 1 View  Result Date: 12/20/2016 CLINICAL DATA:  Fever. EXAM: PORTABLE CHEST 1 VIEW COMPARISON:  Chest x-ray dated December 16, 2016. FINDINGS: Tracheostomy tube in unchanged position. Interval removal of right PICC line. Cardiomediastinal silhouette is unchanged. Diffuse patchy opacities throughout the right lung, unchanged. New patchy opacities at the left lung base. No pleural effusion or pneumothorax. No acute osseous abnormality. IMPRESSION: 1. New patchy opacities at the left lung base, which could represent atelectasis or developing pneumonia in the proper clinical setting. 2. Unchanged diffuse patchy opacities throughout the right lung. 3. Stable positioning of the tracheostomy tube. Electronically Signed   By: Titus Dubin M.D.   On: 12/20/2016 16:46        Scheduled Meds: . chlorhexidine gluconate (MEDLINE KIT)  15 mL Mouth Rinse BID  . clonazePAM  0.5 mg Per Tube Daily  . diphenhydrAMINE  25 mg Per Tube BID  . famotidine  20 mg Per Tube BID  . fludrocortisone  0.2 mg Per Tube Daily  . free water  200 mL Per Tube Q8H  . glycopyrrolate  0.1 mg Intravenous TID  . heparin  5,000 Units Subcutaneous Q8H  . insulin aspart  0-9 Units Subcutaneous Q4H  . levothyroxine  75 mcg Per Tube QAC breakfast  . mouth rinse  15 mL Mouth Rinse QID  . QUEtiapine  100 mg Per Tube QHS  . QUEtiapine  50 mg Per Tube Daily  . scopolamine  1 patch Transdermal Q72H  . sertraline   50 mg Per Tube Daily  . thiamine  100 mg Per Tube Daily   Continuous Infusions: . feeding supplement (VITAL AF 1.2 CAL) 1,000 mL (12/20/16 2331)  . levofloxacin (LEVAQUIN) IV Stopped (12/20/16 1444)  . vancomycin 750 mg (12/21/16 0531)     LOS: 9 days    Time spent: 40 minutes    Marc Rose, Geraldo Docker, MD [facility] Pager [phone]   If 7PM-7AM, please contact night-coverage [email or web address] Password [email or web address]  12/21/2016, 12:48 PM

## 2016-12-21 NOTE — Progress Notes (Signed)
PHARMACY - PHYSICIAN COMMUNICATION CRITICAL VALUE ALERT - BLOOD CULTURE IDENTIFICATION (BCID)  8/20 bld cxs: 1/2 gpc - bcid invalid.  Name of physician (or Provider) Contacted: Schorr  Changes to prescribed antibiotics required: No changes recommended.  Erin Hearing PharmD., BCPS Clinical Pharmacist Pager [phone] 12/21/2016 10:09 PM

## 2016-12-21 NOTE — Care Management Note (Addendum)
Case Management Note  Patient Details  Name: Marc Rose MRN: [id number] Date of Birth: [birth year]  Subjective/Objective:    From [facility], presents with septic shock, conts on iv abx, trach , vent at 40%, has cortrak tube feeds, requiring a lot of suctioning, febrile.  NCM was informed from [place] ,that in LOS to make referral for [facility], NCM made referral to [facility] for [facility] for [facility]. Patient 's support person would like him to go to [facility].  NCM awaiting call back.     8/22 [place], BSN- NCM received call from Santiago Glad with [facility], stating that per their medical director he is denying at this time for [facility], feel not stable enough to transfer to lower level of care. When he is stable their medical director feels he will be back at baseline and more appropriate for [facility] SNF level of care.  If MD would like to do peer to peer with Dr. Coralie Carpen his phone is [phone],  NCM informed Dr. Wynelle Cleveland , and she states she will not do peer to peer she feels patient is stable for dc to [facility] bed, he is for discharge today, CSW aware, and they have a bed at [facility].                Action/Plan: NCM made referral to [facility] for LTACH.  Expected Discharge Date:                  Expected Discharge Plan:  [place] (LTAC) (From [facility])  In-House Referral:  Clinical Social Work  Discharge planning Services  CM Consult  Post Acute Care Choice:    Choice offered to:     DME Arranged:    DME Agency:     HH Arranged:    [place] Agency:     Status of Service:  In process, will continue to follow  If discussed at Long Length of Stay Meetings, dates discussed:    Additional Comments:  Zenon Mayo, RN 12/21/2016, 12:21 PM

## 2016-12-22 DIAGNOSIS — N390 Urinary tract infection, site not specified: Secondary | ICD-10-CM | POA: Diagnosis not present

## 2016-12-22 DIAGNOSIS — R6521 Severe sepsis with septic shock: Secondary | ICD-10-CM | POA: Diagnosis not present

## 2016-12-22 DIAGNOSIS — J9621 Acute and chronic respiratory failure with hypoxia: Secondary | ICD-10-CM | POA: Diagnosis not present

## 2016-12-22 DIAGNOSIS — Z931 Gastrostomy status: Secondary | ICD-10-CM | POA: Diagnosis not present

## 2016-12-22 DIAGNOSIS — E43 Unspecified severe protein-calorie malnutrition: Secondary | ICD-10-CM

## 2016-12-22 DIAGNOSIS — G610 Guillain-Barre syndrome: Secondary | ICD-10-CM | POA: Diagnosis not present

## 2016-12-22 DIAGNOSIS — I5032 Chronic diastolic (congestive) heart failure: Secondary | ICD-10-CM | POA: Diagnosis not present

## 2016-12-22 DIAGNOSIS — I951 Orthostatic hypotension: Secondary | ICD-10-CM | POA: Diagnosis not present

## 2016-12-22 DIAGNOSIS — E11621 Type 2 diabetes mellitus with foot ulcer: Secondary | ICD-10-CM | POA: Diagnosis not present

## 2016-12-22 DIAGNOSIS — Z930 Tracheostomy status: Secondary | ICD-10-CM

## 2016-12-22 DIAGNOSIS — N179 Acute kidney failure, unspecified: Secondary | ICD-10-CM | POA: Diagnosis not present

## 2016-12-22 DIAGNOSIS — J95851 Ventilator associated pneumonia: Secondary | ICD-10-CM | POA: Diagnosis not present

## 2016-12-22 DIAGNOSIS — Z681 Body mass index (BMI) 19 or less, adult: Secondary | ICD-10-CM | POA: Diagnosis not present

## 2016-12-22 DIAGNOSIS — E1151 Type 2 diabetes mellitus with diabetic peripheral angiopathy without gangrene: Secondary | ICD-10-CM | POA: Diagnosis not present

## 2016-12-22 DIAGNOSIS — I272 Pulmonary hypertension, unspecified: Secondary | ICD-10-CM | POA: Diagnosis not present

## 2016-12-22 DIAGNOSIS — L97519 Non-pressure chronic ulcer of other part of right foot with unspecified severity: Secondary | ICD-10-CM | POA: Diagnosis not present

## 2016-12-22 DIAGNOSIS — Z7189 Other specified counseling: Secondary | ICD-10-CM

## 2016-12-22 DIAGNOSIS — N319 Neuromuscular dysfunction of bladder, unspecified: Secondary | ICD-10-CM

## 2016-12-22 DIAGNOSIS — G9341 Metabolic encephalopathy: Secondary | ICD-10-CM | POA: Diagnosis not present

## 2016-12-22 DIAGNOSIS — I110 Hypertensive heart disease with heart failure: Secondary | ICD-10-CM | POA: Diagnosis not present

## 2016-12-22 DIAGNOSIS — Z515 Encounter for palliative care: Secondary | ICD-10-CM

## 2016-12-22 DIAGNOSIS — A4152 Sepsis due to Pseudomonas: Secondary | ICD-10-CM | POA: Diagnosis not present

## 2016-12-22 DIAGNOSIS — Z9911 Dependence on respirator [ventilator] status: Secondary | ICD-10-CM | POA: Diagnosis not present

## 2016-12-22 DIAGNOSIS — M792 Neuralgia and neuritis, unspecified: Secondary | ICD-10-CM

## 2016-12-22 DIAGNOSIS — E039 Hypothyroidism, unspecified: Secondary | ICD-10-CM | POA: Diagnosis not present

## 2016-12-22 DIAGNOSIS — E876 Hypokalemia: Secondary | ICD-10-CM | POA: Diagnosis not present

## 2016-12-22 DIAGNOSIS — J151 Pneumonia due to Pseudomonas: Secondary | ICD-10-CM | POA: Diagnosis not present

## 2016-12-22 DIAGNOSIS — D638 Anemia in other chronic diseases classified elsewhere: Secondary | ICD-10-CM | POA: Diagnosis not present

## 2016-12-22 LAB — GLUCOSE, CAPILLARY
GLUCOSE-CAPILLARY: 100 mg/dL — AB (ref 65–99)
Glucose-Capillary: 127 mg/dL — ABNORMAL HIGH (ref 65–99)
Glucose-Capillary: 111 mg/dL — ABNORMAL HIGH (ref 65–99)

## 2016-12-22 MED ORDER — WHITE PETROLATUM GEL
Status: AC
  Administered 2016-12-22: 05:00:00
  Filled 2016-12-22: qty 1

## 2016-12-22 MED ORDER — FREE WATER: 200.0000 mL | Freq: Two times a day (BID) | Status: DC

## 2016-12-22 MED ORDER — ALBUTEROL SULFATE (2.5 MG/3ML) 0.083% IN NEBU: 2.5000 mg | INHALATION_SOLUTION | RESPIRATORY_TRACT | 12 refills | Status: DC | PRN

## 2016-12-22 MED ORDER — LEVOFLOXACIN 25 MG/ML PO SOLN: 750.0000 mg | Freq: Every day | ORAL | 0 refills | Status: DC

## 2016-12-22 MED ORDER — SCOPOLAMINE 1 MG/3DAYS TD PT72: 1.0000 | MEDICATED_PATCH | TRANSDERMAL | 12 refills | Status: AC

## 2016-12-22 MED ORDER — DOXYCYCLINE MONOHYDRATE 100 MG PO TABS: 100.0000 mg | ORAL_TABLET | Freq: Two times a day (BID) | ORAL | 0 refills | Status: DC

## 2016-12-22 NOTE — Progress Notes (Signed)
Attempted to contact [facility] again to call report and again unable to reach RN. Provided name and number again and relayed that pt will be on his way soon

## 2016-12-22 NOTE — Discharge Summary (Signed)
Physician Discharge Summary  ARISTOTELIS VILARDI PFX:[id number] DOB: 06-12-1950 DOA: 12/12/2016  PCP: Patient, [name]  Admit date: 12/12/2016 Discharge date: 12/22/2016  Admitted From: [facility] section Disposition:  Return to above   Recommendations for Outpatient Follow-up:  1. outpt ID f/u with Dr Drucilla Schmidt in 1 wk for h/o osteomyelitis 2. Follow for fever recurrence- cont Levaquin per tube for 4 more days to complete 14 days for HCAP. 3. Resumed Doxy 100 BID per tube - continue until he sees Dr Drucilla Schmidt    Discharge Condition:  stable   CODE STATUS:  Full code   Diet recommendation:  Tube feed Consultations:  PCCM    Discharge Diagnoses:  Principal Problem:   Septic shock/  HCAP - pseudomonas Active Problems:   HTN (hypertension)   Peripheral arterial disease (HCC)   Peripheral neuropathic pain   Neurogenic urinary bladder disorder   Miller-Fisher variant Guillain-Barre syndrome (HCC)   Pressure injury of skin   Normochromic normocytic anemia   Protein-calorie malnutrition, severe    Subjective: Alert, responds to commands, no complaints.   Brief Summary: 66yo M with history of Guillain-Barre Syndrome ( noted to have ataxia, ophthalmoplegia and areflexia and treated with IVIG in 06/2016) for which he has a Tracheostomy and PEG on Chronic Mechanical ventilation and is living at [facility]. He also has a h/o Anxiety, OA, Chronic pain, DM, HTN, PAD, and Orthostatic hypotension. He was transferred from [facility] to the ER on 8/12 w/ Fever and acute hypoxic resp failure. On arrival to the ER he was in shock with BP 50/30 and minimally responsive only withdrawing to pain. He had foul smelling urine, a large unstageable ulcer on his foot and pulmonary infiltrates on CXR.Admitted by pulmonary critical care. 8/15: bronchoscopy- Obstruction RLL, RML, BI, lavaged(pus)-  brushing performed  8/15- care transferred to hospitalist service Tracheal aspirate grew  Pseudomonas.  Hospital Course:  Septic shock due to Pseudomonas HCAP - shock resolved  Vancomycin 8/12 > 8/15 Aztreonam 8/11 > 8/12 Ceftazidime 8/12 > 8/15 Levaquin 8/15 >  - scopolamine started yesterday for excess mucous which has improved - PRN Albuterol ordered  Recurrent Fever Fevers as high as 102.6 while on Levaquin - pan culture and repeated CXR >> New patchy opacities at the left lung base, which could represent atelectasis or developing pneumonia in the proper clinical setting.2. Unchanged diffuse patchy opacities throughout the right lung. no clear source on physical exam   - no change in antibiotics made- no fever in > 24 hrs.   Acute-on-Chronic Hypoxic respiratory failure chronic vent since GBS 06/2016 - typically on 35% FIO2   NOTE: h/o hardware associated diskitis, vertebral osteomyelitis and psoas abscess with plans per Dr Drucilla Schmidt to remain on Minocycline/Doxy indefinitely (see note on 9/17)- will resume this  Anemia  baseline Hgb 8-10 - no clear blood loss - ?simply dilutional - transfused 2U PRBC since admit - stable hgb at this time    Neurogenic urinary bladder disorder  Acute v/s Chronic Encephalopathy presumed due to sepsis initially but now appears this is his baseline  Hx Orthostatic Hypotension on fludrocortisone chronically - this is being continued - lytes stable   AKI resolved 8/13  Hypokalemia  Cont to supplement to goal of 4.0  Severe malnutrition in context of chronic illness PEG tube dependent - continue TF  Unstageable Foot Ulcer  Continue dressing changes and pressure relief boots - not felt to be infected   DM CBG controlled   Hx hypothyroidism  Continue synthroid - TSH not at goal, but pt acutely ill - will not change dose for now - recheck TSH when clinically at baseline    Discharge Instructions   Allergies as of 12/22/2016      Reactions   Codeine Anaphylaxis, Hives, Swelling   Penicillins Anaphylaxis, Hives,  Swelling   Latex Itching   Strawberry Extract Hives      Medication List    STOP taking these medications   0.9 % NaCl with KCl 40 mEq / L 40-0.9 MEQ/L-%   clindamycin 600 MG/50ML IVPB Commonly known as:  CLEOCIN   enoxaparin 40 MG/0.4ML injection Commonly known as:  LOVENOX   fentaNYL 100 MCG/2ML injection Commonly known as:  SUBLIMAZE   fentaNYL 2,500 mcg in sodium chloride 0.9 % 200 mL   haloperidol lactate 5 MG/ML injection Commonly known as:  HALDOL   Immune Globulin 10% 10 GM/100ML Soln Commonly known as:  PRIVIGEN   midazolam 50 mg in sodium chloride 0.9 % 40 mL     TAKE these medications   acetaminophen 650 MG CR tablet Commonly known as:  TYLENOL Take 650 mg by mouth every 8 (eight) hours as needed for pain. Per tube What changed:  Another medication with the same name was removed. Continue taking this medication, and follow the directions you see here.   albuterol (2.5 MG/3ML) 0.083% nebulizer solution Commonly known as:  PROVENTIL Take 3 mLs (2.5 mg total) by nebulization every 2 (two) hours as needed for wheezing.   aspirin 81 MG chewable tablet Place 81 mg into feeding tube daily.   bisacodyl 10 MG suppository Commonly known as:  DULCOLAX Place 10 mg rectally as needed for moderate constipation.   CALMOSEPTINE 0.44-20.6 % Oint Generic drug:  Menthol-Zinc Oxide Apply topically 2 (two) times daily. To groin and buttocks   chlorhexidine 0.12 % solution Commonly known as:  PERIDEX Use as directed 15 mLs in the mouth or throat 2 (two) times daily.   clonazePAM 0.5 MG tablet Commonly known as:  KLONOPIN Place 0.5 mg into feeding tube daily.   diphenhydrAMINE 25 MG tablet Commonly known as:  BENADRYL Take 25 mg by mouth every 6 (six) hours as needed for itching. Per tube What changed:  Another medication with the same name was removed. Continue taking this medication, and follow the directions you see here.   doxycycline 100 MG tablet Commonly  known as:  ADOXA Take 1 tablet (100 mg total) by mouth 2 (two) times daily.   feeding supplement (PRO-STAT SUGAR FREE 64) Liqd Place 30 mLs into feeding tube daily.   fludrocortisone 0.1 MG tablet Commonly known as:  FLORINEF Place 0.2 mg into feeding tube daily.   fluticasone 50 MCG/ACT nasal spray Commonly known as:  FLONASE Place 1 spray into both nostrils daily as needed for allergies.   free water Soln Place 200 mLs into feeding tube 2 (two) times daily.   gabapentin 300 MG capsule Commonly known as:  NEURONTIN Place 300 mg into feeding tube 3 (three) times daily.   ipratropium-albuterol 0.5-2.5 (3) MG/3ML Soln Commonly known as:  DUONEB Take 3 mLs by nebulization every 6 (six) hours as needed (for SOB).   levofloxacin 25 MG/ML solution Commonly known as:  LEVAQUIN Take 30 mLs (750 mg total) by mouth daily.   levothyroxine 75 MCG tablet Commonly known as:  SYNTHROID, LEVOTHROID Place 75 mcg into feeding tube daily before breakfast.   lidocaine 2 % solution Commonly known as:  XYLOCAINE Use as  directed 2 mLs in the mouth or throat every 4 (four) hours as needed for mouth pain.   LORazepam 2 MG/ML concentrated solution Commonly known as:  ATIVAN Take 1 mg by mouth every 4 (four) hours as needed for anxiety.   metoCLOPramide 5 MG/ML injection Commonly known as:  REGLAN Inject 5 mg into the vein every 8 (eight) hours.   mineral oil enema Place 1 enema rectally every 12 (twelve) hours as needed for mild constipation or severe constipation.   multivitamin with minerals Tabs tablet Place 1 tablet into feeding tube daily.   ondansetron 4 MG tablet Commonly known as:  ZOFRAN Place 1 tablet (4 mg total) into feeding tube every 6 (six) hours as needed for nausea.   pantoprazole 40 MG injection Commonly known as:  PROTONIX Inject 40 mg into the vein daily. What changed:  when to take this   polyethylene glycol packet Commonly known as:  MIRALAX / GLYCOLAX Place  17 g into feeding tube daily as needed for mild constipation.   polyvinyl alcohol 1.4 % ophthalmic solution Commonly known as:  LIQUIFILM TEARS Place 1 drop into both eyes 3 (three) times daily.   QUEtiapine 50 MG tablet Commonly known as:  SEROQUEL Place 50 mg into feeding tube 2 (two) times daily.   scopolamine 1 MG/3DAYS Commonly known as:  TRANSDERM-SCOP Place 1 patch (1.5 mg total) onto the skin every 3 (three) days.   senna-docusate 8.6-50 MG tablet Commonly known as:  Senokot-S Place 1 tablet into feeding tube at bedtime. What changed:  how much to take  when to take this  reasons to take this   sertraline 50 MG tablet Commonly known as:  ZOLOFT Place 50 mg into feeding tube daily.   simethicone 80 MG chewable tablet Commonly known as:  MYLICON Place 756 mg into feeding tube 2 times daily at 12 noon and 4 pm.   sucralfate 1 g tablet Commonly known as:  CARAFATE Place 1 g into feeding tube 2 (two) times daily.   thiamine 100 MG tablet Commonly known as:  VITAMIN B-1 Place 100 mg into feeding tube daily.   thiamine 100 MG tablet Place 1 tablet (100 mg total) into feeding tube daily.   vitamin A & D ointment Apply 1 application topically daily.   vitamin C 500 MG tablet Commonly known as:  ASCORBIC ACID Place 500 mg into feeding tube 2 (two) times daily.   zinc sulfate 220 (50 Zn) MG capsule Place 220 mg into feeding tube daily.            Discharge Care Instructions        Start     Ordered   12/24/16 0000  scopolamine (TRANSDERM-SCOP) 1 MG/3DAYS  every 72 hours     12/22/16 0919   12/22/16 0000  albuterol (PROVENTIL) (2.5 MG/3ML) 0.083% nebulizer solution  Every 2 hours PRN     12/22/16 0919   12/22/16 0000  Water For Irrigation, Sterile (FREE WATER) SOLN  2 times daily     12/22/16 0919   12/22/16 0000  doxycycline (ADOXA) 100 MG tablet  2 times daily     12/22/16 0919   12/22/16 0000  levofloxacin (LEVAQUIN) 25 MG/ML solution  Daily      12/22/16 0932      Allergies  Allergen Reactions  . Codeine Anaphylaxis, Hives and Swelling  . Penicillins Anaphylaxis, Hives and Swelling  . Latex Itching  . Strawberry Extract Hives     Procedures/Studies: Broncoscopy  Dg Chest Port 1 View  Result Date: 12/20/2016 CLINICAL DATA:  Fever. EXAM: PORTABLE CHEST 1 VIEW COMPARISON:  Chest x-ray dated December 16, 2016. FINDINGS: Tracheostomy tube in unchanged position. Interval removal of right PICC line. Cardiomediastinal silhouette is unchanged. Diffuse patchy opacities throughout the right lung, unchanged. New patchy opacities at the left lung base. No pleural effusion or pneumothorax. No acute osseous abnormality. IMPRESSION: 1. New patchy opacities at the left lung base, which could represent atelectasis or developing pneumonia in the proper clinical setting. 2. Unchanged diffuse patchy opacities throughout the right lung. 3. Stable positioning of the tracheostomy tube. Electronically Signed   By: Titus Dubin M.D.   On: 12/20/2016 16:46   Dg Chest Port 1 View  Result Date: 12/16/2016 CLINICAL DATA:  Follow-up pneumonia EXAM: PORTABLE CHEST 1 VIEW COMPARISON:  12/15/2016 FINDINGS: Cardiac shadow is stable. Tracheostomy tube is again seen extending. Right-sided PICC line is again identified within the mid right subclavian vein. Left lung is clear. Persistent changes in the right lung are again noted and stable. Postoperative changes in the thoracolumbar spine are seen. IMPRESSION: No significant interval change from the prior exam. Electronically Signed   By: Inez Catalina M.D.   On: 12/16/2016 06:48   Dg Chest Port 1 View  Result Date: 12/15/2016 CLINICAL DATA:  Preop for bronchoscopy. EXAM: PORTABLE CHEST 1 VIEW COMPARISON:  Chest x-ray dated December 14, 2016. FINDINGS: Unchanged tracheostomy tube with the tip approximately 2.5 cm above the carina. Right-sided PICC catheter with the tip retracted to the mid subclavian vein, similar to  prior study. Diffuse patchy opacities throughout the right lung, unchanged. The left lung is relatively clear. The cardiomediastinal silhouette is stable. No pleural effusion or pneumothorax. IMPRESSION: 1. Unchanged right-sided PICC catheter with tip in the mid subclavian vein. 2. Unchanged diffuse infiltrates throughout the right lung. Electronically Signed   By: Titus Dubin M.D.   On: 12/15/2016 10:48   Dg Chest Port 1 View  Result Date: 12/14/2016 CLINICAL DATA:  Respiratory failure. EXAM: PORTABLE CHEST 1 VIEW COMPARISON:  12/14/2010.  12/13/2010.  06/25/2016.  CT 06/06/2016. FINDINGS: PICC line tip noted over the right subclavian vein. Tracheostomy tube in stable position. Stable mediastinal prominence consistent previously identified small mediastinal lymph nodes. Diffuse right lung infiltrate. Small right pleural effusion. No pneumothorax. Prior thoracolumbar spine fusion. IMPRESSION: 1. Right PICC line tip noted over the right subclavian vein. Tracheostomy tube in stable position. Electronically Signed   By: Marcello Moores  Register   On: 12/14/2016 07:27   Dg Chest Port 1 View  Result Date: 12/13/2016 CLINICAL DATA:  Pneumonia. EXAM: PORTABLE CHEST 1 VIEW COMPARISON:  One-view chest x-ray 12/12/2016. FINDINGS: A tracheostomy tube is in place. Persistent right lower lobe pneumonia is present. A right pleural effusion is noted. The left lung remains clear. Heart size is normal. Mild gaseous distention of the colon is noted. IMPRESSION: 1. Right lower lobe pneumonia. 2. Tracheostomy tube is stable. 3. Mild gaseous distention of bowel. Electronically Signed   By: San Morelle M.D.   On: 12/13/2016 07:06   Dg Chest Port 1 View  Result Date: 12/12/2016 CLINICAL DATA:  Acute onset of sepsis.  Initial encounter. EXAM: PORTABLE CHEST 1 VIEW COMPARISON:  Chest radiograph performed 06/25/2016 FINDINGS: Dense right-sided airspace opacity is compatible with pneumonia. No pleural effusion or pneumothorax  is seen. The cardiomediastinal silhouette is normal in size. No acute osseous abnormalities are identified. A right PICC is noted ending about the proximal SVC. A tracheostomy tube  is noted ending 4-5 cm above the carina. Thoracolumbar spinal fusion hardware is partially imaged. IMPRESSION: Dense right-sided pneumonia noted. Electronically Signed   By: Garald Balding M.D.   On: 12/12/2016 01:51       Discharge Exam: Vitals:   12/22/16 0800 12/22/16 0853  BP: (!) 147/90 (!) 139/95  Pulse: 71 76  Resp: 17 20  Temp:    SpO2: 100% 100%   Vitals:   12/22/16 0600 12/22/16 0738 12/22/16 0800 12/22/16 0853  BP: 117/90 (!) 134/95 (!) 147/90 (!) 139/95  Pulse: 72 64 71 76  Resp: 17 18 17 20   Temp:  98.1 F (36.7 C)    TempSrc:  Oral    SpO2: 100% 100% 100% 100%  Weight:      Height:        General: Pt is alert, awake, not in acute distress on Vent, following commands Cardiovascular: RRR, S1/S2 +, no rubs, no gallops Respiratory: CTA bilaterally, no wheezing, no rhonchi Abdominal: Soft, NT, ND, bowel sounds +, PEG is intact, tube feed is running Extremities: no edema, no cyanosis    The results of significant diagnostics from this hospitalization (including imaging, microbiology, ancillary and laboratory) are listed below for reference.     Microbiology: Recent Results (from the past 240 hour(s))  Urine culture     Status: None   Collection Time: 12/12/16 10:54 PM  Result Value Ref Range Status   Specimen Description URINE, RANDOM  Final   Special Requests NONE  Final   Culture NO GROWTH  Final   Report Status 12/14/2016 FINAL  Final  Culture, respiratory (NON-Expectorated)     Status: Abnormal   Collection Time: 12/15/16  2:54 PM  Result Value Ref Range Status   Specimen Description BRONCHIAL ALVEOLAR LAVAGE  Final   Special Requests RLL  Final   Gram Stain   Final    ABUNDANT WBC PRESENT, PREDOMINANTLY PMN NO ORGANISMS SEEN    Culture MULTIPLE ORGANISMS PRESENT, NONE  PREDOMINANT (A)  Final   Report Status 12/17/2016 FINAL  Final  Culture, blood (routine x 2)     Status: None (Preliminary result)   Collection Time: 12/20/16  4:40 PM  Result Value Ref Range Status   Specimen Description BLOOD LEFT HAND  Final   Special Requests IN PEDIATRIC BOTTLE Blood Culture adequate volume  Final   Culture NO GROWTH < 24 HOURS  Final   Report Status PENDING  Incomplete  Culture, blood (routine x 2)     Status: None (Preliminary result)   Collection Time: 12/20/16  4:43 PM  Result Value Ref Range Status   Specimen Description BLOOD RIGHT HAND  Final   Special Requests IN PEDIATRIC BOTTLE Blood Culture adequate volume  Final   Culture  Setup Time   Final    GRAM POSITIVE COCCI IN CLUSTERS IN PEDIATRIC BOTTLE CRITICAL RESULT CALLED TO, READ BACK BY AND VERIFIED WITH: Tillman Sers PHARMD 2203 12/21/16 A BROWNING    Culture GRAM POSITIVE COCCI  Final   Report Status PENDING  Incomplete     Labs: BNP (last 3 results) No results for input(s): BNP in the last 8760 hours. Basic Metabolic Panel:  Recent Labs Lab 12/16/16 0447 12/16/16 1449 12/17/16 0942 12/18/16 0345 12/19/16 0301 12/20/16 0310 12/21/16 0126  NA 141  --  139 141 139 137 137  K 3.1* 3.6 3.3* 3.5 3.7 3.5 3.6  CL 106  --  103 105 104 102 103  CO2 28  --  26 30 27 27 26   GLUCOSE 138*  --  119* 112* 112* 102* 122*  BUN 13  --  17 21* 18 19 19   CREATININE 0.56*  --  0.49* 0.54* 0.58* 0.62 0.67  CALCIUM 8.4*  --  8.4* 8.6* 8.6* 8.3* 8.5*  MG 1.5* 2.0 1.7 1.8  --   --   --    Liver Function Tests:  Recent Labs Lab 12/16/16 0447 12/21/16 0126  AST 22 19  ALT 31 19  ALKPHOS 90 79  BILITOT 0.7 0.6  PROT 5.9* 6.6  ALBUMIN 2.0* 2.3*   No results for input(s): LIPASE, AMYLASE in the last 168 hours. No results for input(s): AMMONIA in the last 168 hours. CBC:  Recent Labs Lab 12/16/16 0447 12/16/16 1819 12/18/16 0345 12/19/16 0301 12/20/16 0310 12/21/16 0126  WBC 7.9  --  5.6 8.2 9.0  8.4  HGB 7.8* 10.0* 9.2* 9.8* 10.4* 10.8*  HCT 24.0* 30.0* 28.2* 29.8* 32.2* 32.4*  MCV 83.3  --  85.5 84.2 83.6 83.1  PLT 279  --  331 398 448* 434*   Cardiac Enzymes: No results for input(s): CKTOTAL, CKMB, CKMBINDEX, TROPONINI in the last 168 hours. BNP: Invalid input(s): POCBNP CBG:  Recent Labs Lab 12/21/16 1647 12/21/16 2113 12/21/16 2337 12/22/16 0338 12/22/16 0737  GLUCAP 127* 111* 119* 127* 111*   D-Dimer No results for input(s): DDIMER in the last 72 hours. Hgb A1c No results for input(s): HGBA1C in the last 72 hours. Lipid Profile No results for input(s): CHOL, HDL, LDLCALC, TRIG, CHOLHDL, LDLDIRECT in the last 72 hours. Thyroid function studies No results for input(s): TSH, T4TOTAL, T3FREE, THYROIDAB in the last 72 hours.  Invalid input(s): FREET3 Anemia work up No results for input(s): VITAMINB12, FOLATE, FERRITIN, TIBC, IRON, RETICCTPCT in the last 72 hours. Urinalysis    Component Value Date/Time   COLORURINE YELLOW 12/20/2016 1613   APPEARANCEUR CLOUDY (A) 12/20/2016 1613   LABSPEC 1.018 12/20/2016 1613   PHURINE 7.0 12/20/2016 1613   GLUCOSEU NEGATIVE 12/20/2016 1613   HGBUR NEGATIVE 12/20/2016 1613   BILIRUBINUR NEGATIVE 12/20/2016 1613   KETONESUR NEGATIVE 12/20/2016 1613   PROTEINUR 30 (A) 12/20/2016 1613   UROBILINOGEN 1.0 09/16/2013 2001   NITRITE NEGATIVE 12/20/2016 1613   LEUKOCYTESUR MODERATE (A) 12/20/2016 1613   Sepsis Labs Invalid input(s): PROCALCITONIN,  WBC,  LACTICIDVEN Microbiology Recent Results (from the past 240 hour(s))  Urine culture     Status: None   Collection Time: 12/12/16 10:54 PM  Result Value Ref Range Status   Specimen Description URINE, RANDOM  Final   Special Requests NONE  Final   Culture NO GROWTH  Final   Report Status 12/14/2016 FINAL  Final  Culture, respiratory (NON-Expectorated)     Status: Abnormal   Collection Time: 12/15/16  2:54 PM  Result Value Ref Range Status   Specimen Description BRONCHIAL  ALVEOLAR LAVAGE  Final   Special Requests RLL  Final   Gram Stain   Final    ABUNDANT WBC PRESENT, PREDOMINANTLY PMN NO ORGANISMS SEEN    Culture MULTIPLE ORGANISMS PRESENT, NONE PREDOMINANT (A)  Final   Report Status 12/17/2016 FINAL  Final  Culture, blood (routine x 2)     Status: None (Preliminary result)   Collection Time: 12/20/16  4:40 PM  Result Value Ref Range Status   Specimen Description BLOOD LEFT HAND  Final   Special Requests IN PEDIATRIC BOTTLE Blood Culture adequate volume  Final   Culture NO GROWTH < 24  HOURS  Final   Report Status PENDING  Incomplete  Culture, blood (routine x 2)     Status: None (Preliminary result)   Collection Time: 12/20/16  4:43 PM  Result Value Ref Range Status   Specimen Description BLOOD RIGHT HAND  Final   Special Requests IN PEDIATRIC BOTTLE Blood Culture adequate volume  Final   Culture  Setup Time   Final    GRAM POSITIVE COCCI IN CLUSTERS IN PEDIATRIC BOTTLE CRITICAL RESULT CALLED TO, READ BACK BY AND VERIFIED WITH: Tillman Sers [facility] 2203 12/21/16 A BROWNING    Culture GRAM POSITIVE COCCI  Final   Report Status PENDING  Incomplete     Time coordinating discharge: Over 30 minutes  SIGNED:   Debbe Odea, MD  [facility] 12/22/2016, 9:34 AM Pager   If 7PM-7AM, please contact night-coverage [email or web address] Password [email or web address]

## 2016-12-22 NOTE — Progress Notes (Signed)
Attempted to call [facility] to provide report on pt. Was told RN unavailable.  Provided name and number and was told I would receive call back

## 2016-12-22 NOTE — Consult Note (Signed)
Consultation Note Date: 12/22/2016   Patient Name: Marc Rose  DOB: 1950-12-14  MRN: [id number]  Age / Sex: 66 y.o., male  PCP: Patient, [name] Referring Physician: Debbe Odea, MD  Reason for Consultation: Establishing goals of care and Psychosocial/spiritual support  HPI/Patient Profile: 66 y.o. 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 He also has a hx of lumbar discitis/osteomyelitis for which he has been on chronic antibiotics (had been followed by ID in the past, doxycycline stopped on 06/2016 admission, unclear if it was ever restarted), chronic back and leg pain, dCHF, OA, DM2, PAD, and orthostatic hypotension. He has been residing at [facility] He presented with a fever and AMS. He was admitted on 12/12/2016. On arrival he was found to be in septic shock secondary to Pseudomonas HCPA in RLL. He has struggled with copious secretions despite interventions, and continues to have fevers despite appropriate antibiotic coverage. There is concern for autonomic dysreflexia. Ongoing severe protein-calorie malnutrition despite PEG feedings. He is also severely cognitively impaired with only withdrawal to painful stimuli; it is unclear what his baseline status is. Sister and two friends listed in contacts. Palliative consulted to assist in clarifying goals of care.   Clinical Assessment and Goals of Care: Mr. Liskey is more alert and interactive today. He is not yet verbalizing, but now following commands and responsive to yes/no questions consistently (nodding and shaking head for responses). He has no acute complaints.  I called his sister, listed in the chart as an emergency contact. [name] was a bit confused about his location and did not seem to know he was at [facility]. I spent some time discussing his presentation,  diagnoses, and treatments since admission. I then gave an update today on his status. In discussing his situation, she reports a progressive improvement in his functional status and cognition over the past few months. He had been able to speak with her over the phone and was completely lucid and oriented during their conversations. She also reports improvement in his movement with his leg spasms improving and better control.   In reviewing prior PMT notes I saw the discussion on the plan for [facility] to try to locate her brother's Advanced Directives. She has not been able to find these, nor has he been able to clarify if he ever filled them out (he cannot recall filling anything out, when she had asked him). At this point she believes the plan should be ongoing full scope aggressive care. If things should deteriorate with low chance of recovery, she is open to readdressing this. At her request, I also called and updated Volney Presser, who is a close friend who lives locally [name] is in [place]).   Primary Decision Maker NEXT OF KIN    SUMMARY OF RECOMMENDATIONS    Full code, full scope aggressive care  Noted prior ID involvement with plan for long-term abx for discitis/osteomyelitis (which appear to have been stopped at [facility]); Primary team notified. [facility] called to clarify, but no call back.   Code Status/Advance Care Planning:  Full code  Additional Recommendations (Limitations, Scope, Preferences):  Full Scope Treatment  Psycho-social/Spiritual:   Desire for further Chaplaincy support:no  Prognosis:   Unable to determine  Discharge Planning: LTACH      Primary Diagnoses: Present on Admission: . Septic shock ([place]) . HCAP (healthcare-associated pneumonia) . Sepsis due to undetermined organism ([place]) . Normochromic normocytic anemia .  Protein-calorie malnutrition, severe . Urinary tract infection without hematuria . Pressure injury of skin . Peripheral neuropathic pain . Peripheral  arterial disease ([place]) . Neurogenic urinary bladder disorder . Miller-Fisher variant Guillain-Barre syndrome ([place]) . HTN (hypertension)   I have reviewed the medical record, interviewed the patient and family, and examined the patient. The following aspects are pertinent.  Past Medical History:  Diagnosis Date  . Anxiety state, unspecified   . Arthritis   . 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 Benign neoplasm of colon   . Causalgia of upper limb    Left limb  . Chronic back pain   . Chronic leg pain   . Depression   . Diabetes mellitus   . Diskitis 02/05/2015  . Drug rash 11/12/2015  . Elevated blood pressure reading without diagnosis of hypertension   . Essential hypertension, benign   . Hardware complicating wound infection ([place]) 02/05/2015  . Hyperpigmentation 05/15/2015  . Hypertension   . Impaired fasting glucose   . Peripheral arterial disease ([place])   . Pure hypercholesterolemia   . Tobacco use disorder   . Unspecified retinal detachment    Social History   Social History  . Marital status: Widowed    Spouse name: N/A  . Number of children: N/A  . Years of education: N/A   Social History Main Topics  . Smoking status: Current Every Day Smoker    Packs/day: 0.50    Years: 39.00    Types: Cigarettes  . Smokeless tobacco: Never Used  . Alcohol use No     Comment: occasionally  . Drug use: Yes    Types: Marijuana  . Sexual activity: Not Asked   Other Topics Concern  . None   Social History Narrative  . None   Family History  Problem Relation Age of Onset  . COPD Mother   . Hyperthyroidism Mother   . Hyperthyroidism Sister    Scheduled Meds: . chlorhexidine gluconate (MEDLINE KIT)  15 mL Mouth Rinse BID  . clonazePAM  0.5 mg Per Tube Daily  . diphenhydrAMINE  25 mg Per Tube BID  . famotidine  20 mg Per Tube BID  . fludrocortisone  0.2 mg Per Tube Daily  . free water  200 mL Per Tube BID  . furosemide  60 mg Intravenous Daily  . glycopyrrolate  0.1 mg Intravenous  TID  . heparin  5,000 Units Subcutaneous Q8H  . insulin aspart  0-9 Units Subcutaneous Q4H  . levothyroxine  75 mcg Per Tube QAC breakfast  . mouth rinse  15 mL Mouth Rinse QID  . QUEtiapine  100 mg Per Tube QHS  . QUEtiapine  50 mg Per Tube Daily  . scopolamine  1 patch Transdermal Q72H  . sertraline  50 mg Per Tube Daily  . thiamine  100 mg Per Tube Daily   Continuous Infusions: . feeding supplement (VITAL AF 1.2 CAL) 1,000 mL (12/21/16 1842)  . levofloxacin (LEVAQUIN) IV Stopped (12/21/16 1636)  . vancomycin Stopped (12/22/16 0607)   PRN Meds:.acetaminophen, albuterol, bisacodyl, diphenhydrAMINE, docusate, fentaNYL (SUBLIMAZE) injection, metoprolol tartrate, midazolam, ondansetron (ZOFRAN) IV, sodium chloride flush Allergies  Allergen Reactions  . Codeine Anaphylaxis, Hives and Swelling  . Penicillins Anaphylaxis, Hives and Swelling  . Latex Itching  . Strawberry Extract Hives   Review of Systems  Limited as pt not verbally responding. Will consistently nod/shake head. No pain, HA, N/V. Discomfort in back/bottom.  Physical Exam  Constitutional: He appears cachectic. He has a sickly appearance.  Chronically ill  appearing male in no acute distress.  HENT:  Head: Normocephalic and atraumatic.  Mouth/Throat: Oropharynx is clear and moist. No oropharyngeal exudate.  Eyes: EOM are normal.  Cardiovascular: Normal rate and regular rhythm.   Pulmonary/Chest: Effort normal and breath sounds normal. No respiratory distress.  Trach, vent. Thick/viscous secretions around trach site.   Abdominal: Soft.  PEG, site appears benign  Musculoskeletal: He exhibits no edema.  BLE spasms, though he is able to move them on command. BUE with contractures. Flicker movement in left hand on command.  Neurological: He is alert.  Skin: Skin is warm and dry. There is pallor.  Psychiatric:  Calm in bed.    Vital Signs: BP (!) 147/90   Pulse 71   Temp 98.1 F (36.7 C) (Oral)   Resp 17   Ht '6\' 2"'   (1.88 m)   Wt 61.7 kg (136 lb 0.4 oz)   SpO2 100%   BMI 17.46 kg/m  Pain Assessment: CPOT   Pain Score: 0-No pain   SpO2: SpO2: 100 % O2 Device:SpO2: 100 % O2 Flow Rate: .   IO: Intake/output summary:  Intake/Output Summary (Last 24 hours) at 12/22/16 0806 Last data filed at 12/22/16 0700  Gross per 24 hour  Intake             2300 ml  Output             2925 ml  Net             -625 ml    LBM: Last BM Date: 12/18/16 Baseline Weight: Weight: 68 kg (150 lb) Most recent weight: Weight: 61.7 kg (136 lb 0.4 oz)     Palliative Assessment/Data: PPS 10%    Time Total: 70 minutes Greater than 50%  of this time was spent counseling and coordinating care related to the above assessment and plan.  Signed by: Charlynn Court, NP Palliative Medicine Team Pager # [phone] (M-F 7a-5p) Team Phone # [phone] (Nights/Weekends)

## 2016-12-22 NOTE — Progress Notes (Signed)
Name: Marc Rose MRN: [id number] DOB: [birth year]    ADMISSION DATE:  12/12/2016 CONSULTATION DATE: 12/12/16  REFERRING MD: Dr Roxanne Mins (ER)  CHIEF COMPLAINT: Septic shock  HISTORY OF PRESENT ILLNESS:   [name] with history of Guillain-Barre Syndrome (06/2016), for which he has a Tracheostomy and PEG and is on Chronic Mechanical ventilation since then, most recently at [facility]. He also has h/o Anxiety, OA, Chronic pain, DM, HTN, PAD, and Orthostatic hypotension (for which he receives fludrocortisone 0.76m daily). [facility] F[facility] and AMS. On arrival to the ER he was in shock with BP 50/30. He is unresponsive but unclear what his baseline mental status is. He has foul smelling urine, large unstageable ulcer on foot, and pulmonary infiltrates on CXR. In the ER he was given 30cc/kg IVF bolus, started on broad spectrum antibiotics, and Levophed. No family is present at the time of my exam. Patient withdraws to pain but does not obey commands or attempt to communicate.   CULTURES: Blood cultures (8/12): > Sputum culture (8/12): >pseudo, agalactai Urine culture (8/12): >  BAL 8/15 >  ANTIBIOTICS: Vancomycin 8/12 >>>8/15 Aztreonam 8/12 > 8/12 Ceftazidime 8/12 >>>8/15 Levofloxacin 8/15>>>   LINES/TUBES: 6.0 Distal XLT Cuffed Shiley Trach (unclear when it was placed) RUE Single Lumen PICC (from [facility] Foley catheter (from [facility])*  SUBJECTIVE:  No sig change over weekend.  Febrile 101.4.  Increased tracheal secretions  VITAL SIGNS: Temp:  [98.1 F (36.7 C)-99.2 F (37.3 C)] 98.5 F (36.9 C) (08/22 1048) Pulse Rate:  [59-92] 65 (08/22 1502) Resp:  [8-27] 15 (08/22 1502) BP: (66-159)/(49-105) 100/69 (08/22 1502) SpO2:  [100 %] 100 % (08/22 1502) FiO2 (%):  [40 %] 40 % (08/22 1502) Weight:  [61.7 kg (136 lb 0.4 oz)] 61.7 kg (136 lb 0.4 oz) (08/22 0455)  PHYSICAL EXAMINATION: General:  Frail, chronically ill appearing  male Neuro:  Sleeping, appears comfortable HEENT:  Mm moist, trach c/d  Cardiovascular:  s1s2 rrr Lungs:  resps even non labored on vent, getting chest vest  Abdomen:  Round, soft, non tender  Musculoskeletal:  Warm and dry, no edema    Recent Labs Lab 12/19/16 0301 12/20/16 0310 12/21/16 0126  NA 139 137 137  K 3.7 3.5 3.6  CL 104 102 103  CO2 _0 BUN _1 CREATININE 0.58* 0.62 0.67  GLUCOSE 112* 102* 122*    Recent Labs Lab 12/19/16 0301 12/20/16 0310 12/21/16 0126  HGB 9.8* 10.4* 10.8*  HCT 29.8* 32.2* 32.4*  WBC 8.2 9.0 8.4  PLT 398 448* 434*   Dg Chest Port 1 View  Result Date: 12/20/2016 CLINICAL DATA:  Fever. EXAM: PORTABLE CHEST 1 VIEW COMPARISON:  Chest x-ray dated December 16, 2016. FINDINGS: Tracheostomy tube in unchanged position. Interval removal of right PICC line. Cardiomediastinal silhouette is unchanged. Diffuse patchy opacities throughout the right lung, unchanged. New patchy opacities at the left lung base. No pleural effusion or pneumothorax. No acute osseous abnormality. IMPRESSION: 1. New patchy opacities at the left lung base, which could represent atelectasis or developing pneumonia in the proper clinical setting. 2. Unchanged diffuse patchy opacities throughout the right lung. 3. Stable positioning of the tracheostomy tube. Electronically Signed   By: WTitus DubinM.D.   On: 12/20/2016 16:46    ASSESSMENT / PLAN:  66year old male with GBS with chronic vent via trach, [facility] resident.  Here with HCAP.  One exam, coarse  BS diffusely.  I reviewed CXR myself, trach is in good position.  Discussed with PCCM-NP.  A on C resp failure:  - Maintain on full vent support  Hypoxemia:  - Titrate O2 for sat of 88-92%  Trach status:  - Change trach per RT  - Maintain cuffed trach  HCAP: pseudomonas   - Levaquin and vanc  PCCM will follow.  Rush Farmer, M.D. [facility] Pulmonary/Critical Care Medicine. Pager: [phone]. After hours  pager: [phone].

## 2016-12-22 NOTE — Progress Notes (Addendum)
RT note:  RT noticed patient stoma is larger than #6 shiley XLT that is in place, cuff is inflated to sufficient amount to not cause or allow a leak, patient pulling good volumes and is not in distress. Vitals are stable. RT notified MD of findings and notified charge therapist of findings as well. RT will continue to monitor.

## 2016-12-22 NOTE — Progress Notes (Addendum)
Pt transported at 1530 via [facility]. Report given to [facility] transporters.   Report given to [place], RN [facility].

## 2016-12-22 NOTE — Progress Notes (Signed)
Patient will discharge to [facility] Anticipated discharge date: 8/22 Family notified: pt friend Volney Presser and sister Transportation by [facility] Report #: [phone] ask for nurse for Rm [id number]  CSW signing off.  Jorge Ny, LCSW Clinical Social Worker [phone]

## 2016-12-22 NOTE — Plan of Care (Signed)
Problem: Skin Integrity: Goal: Risk for impaired skin integrity will decrease Outcome: Progressing Patient with extremely chapped lips- began using vaseline to coat lips and foster healing. Patient's foot dressing changed. Care to turn patient every 2 hours.

## 2016-12-22 NOTE — Progress Notes (Signed)
Pts SBP in 70s/low 80s. taken on both arms (see vitals flowsheet). Map above 60. NSR. 60 mg Lasix given this AM. Messaged attending MD

## 2016-12-23 LAB — CULTURE, BLOOD (ROUTINE X 2): Special Requests: ADEQUATE

## 2016-12-25 LAB — CULTURE, BLOOD (ROUTINE X 2)
Culture: NO GROWTH
Special Requests: ADEQUATE

## 2016-12-28 DIAGNOSIS — J9600 Acute respiratory failure, unspecified whether with hypoxia or hypercapnia: Secondary | ICD-10-CM | POA: Diagnosis not present

## 2016-12-28 DIAGNOSIS — Z9911 Dependence on respirator [ventilator] status: Secondary | ICD-10-CM | POA: Diagnosis not present

## 2016-12-31 DIAGNOSIS — Z9911 Dependence on respirator [ventilator] status: Secondary | ICD-10-CM | POA: Diagnosis not present

## 2016-12-31 DIAGNOSIS — J9600 Acute respiratory failure, unspecified whether with hypoxia or hypercapnia: Secondary | ICD-10-CM | POA: Diagnosis not present

## 2016-12-31 DIAGNOSIS — R0902 Hypoxemia: Secondary | ICD-10-CM | POA: Diagnosis not present

## 2017-01-01 DIAGNOSIS — Z431 Encounter for attention to gastrostomy: Secondary | ICD-10-CM | POA: Diagnosis not present

## 2017-01-01 DIAGNOSIS — E43 Unspecified severe protein-calorie malnutrition: Secondary | ICD-10-CM | POA: Diagnosis not present

## 2017-01-01 DIAGNOSIS — I10 Essential (primary) hypertension: Secondary | ICD-10-CM | POA: Diagnosis not present

## 2017-01-01 DIAGNOSIS — I5032 Chronic diastolic (congestive) heart failure: Secondary | ICD-10-CM | POA: Diagnosis not present

## 2017-01-01 DIAGNOSIS — J9620 Acute and chronic respiratory failure, unspecified whether with hypoxia or hypercapnia: Secondary | ICD-10-CM | POA: Diagnosis not present

## 2017-01-01 DIAGNOSIS — G610 Guillain-Barre syndrome: Secondary | ICD-10-CM | POA: Diagnosis not present

## 2017-01-01 DIAGNOSIS — R1312 Dysphagia, oropharyngeal phase: Secondary | ICD-10-CM | POA: Diagnosis not present

## 2017-01-01 DIAGNOSIS — M6281 Muscle weakness (generalized): Secondary | ICD-10-CM | POA: Diagnosis not present

## 2017-01-01 DIAGNOSIS — Z430 Encounter for attention to tracheostomy: Secondary | ICD-10-CM | POA: Diagnosis not present

## 2017-01-10 ENCOUNTER — Encounter (HOSPITAL_COMMUNITY): Payer: Self-pay | Admitting: Emergency Medicine

## 2017-01-10 ENCOUNTER — Inpatient Hospital Stay (HOSPITAL_COMMUNITY)
Admission: EM | Admit: 2017-01-10 | Discharge: 2017-01-14 | DRG: 177 | Disposition: A | Discharge status: Hospice | Payer: PPO | Attending: Internal Medicine | Admitting: Internal Medicine

## 2017-01-10 ENCOUNTER — Emergency Department (HOSPITAL_COMMUNITY): Payer: PPO

## 2017-01-10 DIAGNOSIS — F419 Anxiety disorder, unspecified: Secondary | ICD-10-CM | POA: Diagnosis present

## 2017-01-10 DIAGNOSIS — Y95 Nosocomial condition: Secondary | ICD-10-CM | POA: Diagnosis present

## 2017-01-10 DIAGNOSIS — R739 Hyperglycemia, unspecified: Secondary | ICD-10-CM

## 2017-01-10 DIAGNOSIS — D72829 Elevated white blood cell count, unspecified: Secondary | ICD-10-CM | POA: Diagnosis not present

## 2017-01-10 DIAGNOSIS — Z931 Gastrostomy status: Secondary | ICD-10-CM

## 2017-01-10 DIAGNOSIS — G9340 Encephalopathy, unspecified: Secondary | ICD-10-CM | POA: Diagnosis not present

## 2017-01-10 DIAGNOSIS — J690 Pneumonitis due to inhalation of food and vomit: Principal | ICD-10-CM | POA: Diagnosis present

## 2017-01-10 DIAGNOSIS — D638 Anemia in other chronic diseases classified elsewhere: Secondary | ICD-10-CM | POA: Diagnosis present

## 2017-01-10 DIAGNOSIS — I110 Hypertensive heart disease with heart failure: Secondary | ICD-10-CM | POA: Diagnosis not present

## 2017-01-10 DIAGNOSIS — Z930 Tracheostomy status: Secondary | ICD-10-CM | POA: Diagnosis not present

## 2017-01-10 DIAGNOSIS — I739 Peripheral vascular disease, unspecified: Secondary | ICD-10-CM | POA: Diagnosis not present

## 2017-01-10 DIAGNOSIS — I10 Essential (primary) hypertension: Secondary | ICD-10-CM | POA: Diagnosis not present

## 2017-01-10 DIAGNOSIS — G610 Guillain-Barre syndrome: Secondary | ICD-10-CM | POA: Diagnosis not present

## 2017-01-10 DIAGNOSIS — Z792 Long term (current) use of antibiotics: Secondary | ICD-10-CM

## 2017-01-10 DIAGNOSIS — Z9981 Dependence on supplemental oxygen: Secondary | ICD-10-CM | POA: Diagnosis not present

## 2017-01-10 DIAGNOSIS — E1169 Type 2 diabetes mellitus with other specified complication: Secondary | ICD-10-CM | POA: Diagnosis not present

## 2017-01-10 DIAGNOSIS — J9611 Chronic respiratory failure with hypoxia: Secondary | ICD-10-CM | POA: Diagnosis present

## 2017-01-10 DIAGNOSIS — E039 Hypothyroidism, unspecified: Secondary | ICD-10-CM | POA: Diagnosis not present

## 2017-01-10 DIAGNOSIS — F329 Major depressive disorder, single episode, unspecified: Secondary | ICD-10-CM | POA: Diagnosis present

## 2017-01-10 DIAGNOSIS — I9589 Other hypotension: Secondary | ICD-10-CM | POA: Diagnosis not present

## 2017-01-10 DIAGNOSIS — Z66 Do not resuscitate: Secondary | ICD-10-CM | POA: Diagnosis not present

## 2017-01-10 DIAGNOSIS — I951 Orthostatic hypotension: Secondary | ICD-10-CM | POA: Diagnosis present

## 2017-01-10 DIAGNOSIS — Z7982 Long term (current) use of aspirin: Secondary | ICD-10-CM

## 2017-01-10 DIAGNOSIS — F17210 Nicotine dependence, cigarettes, uncomplicated: Secondary | ICD-10-CM | POA: Diagnosis not present

## 2017-01-10 DIAGNOSIS — J9620 Acute and chronic respiratory failure, unspecified whether with hypoxia or hypercapnia: Secondary | ICD-10-CM | POA: Diagnosis not present

## 2017-01-10 DIAGNOSIS — J80 Acute respiratory distress syndrome: Secondary | ICD-10-CM | POA: Diagnosis not present

## 2017-01-10 DIAGNOSIS — G8929 Other chronic pain: Secondary | ICD-10-CM | POA: Diagnosis present

## 2017-01-10 DIAGNOSIS — Z880 Allergy status to penicillin: Secondary | ICD-10-CM

## 2017-01-10 DIAGNOSIS — D649 Anemia, unspecified: Secondary | ICD-10-CM | POA: Diagnosis not present

## 2017-01-10 DIAGNOSIS — N179 Acute kidney failure, unspecified: Secondary | ICD-10-CM | POA: Diagnosis not present

## 2017-01-10 DIAGNOSIS — R64 Cachexia: Secondary | ICD-10-CM | POA: Diagnosis not present

## 2017-01-10 DIAGNOSIS — Z885 Allergy status to narcotic agent status: Secondary | ICD-10-CM | POA: Diagnosis not present

## 2017-01-10 DIAGNOSIS — Z87892 Personal history of anaphylaxis: Secondary | ICD-10-CM

## 2017-01-10 DIAGNOSIS — Z4682 Encounter for fitting and adjustment of non-vascular catheter: Secondary | ICD-10-CM | POA: Diagnosis not present

## 2017-01-10 DIAGNOSIS — E876 Hypokalemia: Secondary | ICD-10-CM | POA: Diagnosis not present

## 2017-01-10 DIAGNOSIS — Z7189 Other specified counseling: Secondary | ICD-10-CM | POA: Diagnosis not present

## 2017-01-10 DIAGNOSIS — Z9911 Dependence on respirator [ventilator] status: Secondary | ICD-10-CM | POA: Diagnosis not present

## 2017-01-10 DIAGNOSIS — Z7952 Long term (current) use of systemic steroids: Secondary | ICD-10-CM

## 2017-01-10 DIAGNOSIS — E1165 Type 2 diabetes mellitus with hyperglycemia: Secondary | ICD-10-CM | POA: Diagnosis present

## 2017-01-10 DIAGNOSIS — Z515 Encounter for palliative care: Secondary | ICD-10-CM | POA: Diagnosis not present

## 2017-01-10 DIAGNOSIS — Z8701 Personal history of pneumonia (recurrent): Secondary | ICD-10-CM

## 2017-01-10 DIAGNOSIS — I959 Hypotension, unspecified: Secondary | ICD-10-CM | POA: Diagnosis not present

## 2017-01-10 DIAGNOSIS — E860 Dehydration: Secondary | ICD-10-CM | POA: Diagnosis not present

## 2017-01-10 DIAGNOSIS — M6281 Muscle weakness (generalized): Secondary | ICD-10-CM | POA: Diagnosis not present

## 2017-01-10 DIAGNOSIS — L97529 Non-pressure chronic ulcer of other part of left foot with unspecified severity: Secondary | ICD-10-CM | POA: Diagnosis present

## 2017-01-10 DIAGNOSIS — L89890 Pressure ulcer of other site, unstageable: Secondary | ICD-10-CM | POA: Diagnosis not present

## 2017-01-10 DIAGNOSIS — J189 Pneumonia, unspecified organism: Secondary | ICD-10-CM

## 2017-01-10 DIAGNOSIS — E1151 Type 2 diabetes mellitus with diabetic peripheral angiopathy without gangrene: Secondary | ICD-10-CM | POA: Diagnosis present

## 2017-01-10 DIAGNOSIS — M549 Dorsalgia, unspecified: Secondary | ICD-10-CM | POA: Diagnosis present

## 2017-01-10 DIAGNOSIS — K219 Gastro-esophageal reflux disease without esophagitis: Secondary | ICD-10-CM | POA: Diagnosis present

## 2017-01-10 DIAGNOSIS — E11621 Type 2 diabetes mellitus with foot ulcer: Secondary | ICD-10-CM | POA: Diagnosis present

## 2017-01-10 DIAGNOSIS — R031 Nonspecific low blood-pressure reading: Secondary | ICD-10-CM | POA: Diagnosis not present

## 2017-01-10 DIAGNOSIS — J9610 Chronic respiratory failure, unspecified whether with hypoxia or hypercapnia: Secondary | ICD-10-CM | POA: Diagnosis not present

## 2017-01-10 DIAGNOSIS — J9600 Acute respiratory failure, unspecified whether with hypoxia or hypercapnia: Secondary | ICD-10-CM | POA: Diagnosis not present

## 2017-01-10 DIAGNOSIS — Z825 Family history of asthma and other chronic lower respiratory diseases: Secondary | ICD-10-CM

## 2017-01-10 DIAGNOSIS — E43 Unspecified severe protein-calorie malnutrition: Secondary | ICD-10-CM | POA: Diagnosis present

## 2017-01-10 DIAGNOSIS — Z91040 Latex allergy status: Secondary | ICD-10-CM

## 2017-01-10 DIAGNOSIS — I5032 Chronic diastolic (congestive) heart failure: Secondary | ICD-10-CM | POA: Diagnosis not present

## 2017-01-10 DIAGNOSIS — I5030 Unspecified diastolic (congestive) heart failure: Secondary | ICD-10-CM | POA: Diagnosis not present

## 2017-01-10 DIAGNOSIS — R918 Other nonspecific abnormal finding of lung field: Secondary | ICD-10-CM | POA: Diagnosis not present

## 2017-01-10 DIAGNOSIS — E785 Hyperlipidemia, unspecified: Secondary | ICD-10-CM | POA: Diagnosis not present

## 2017-01-10 DIAGNOSIS — Z9102 Food additives allergy status: Secondary | ICD-10-CM

## 2017-01-10 DIAGNOSIS — E119 Type 2 diabetes mellitus without complications: Secondary | ICD-10-CM | POA: Diagnosis not present

## 2017-01-10 DIAGNOSIS — I272 Pulmonary hypertension, unspecified: Secondary | ICD-10-CM | POA: Diagnosis present

## 2017-01-10 DIAGNOSIS — Z681 Body mass index (BMI) 19 or less, adult: Secondary | ICD-10-CM

## 2017-01-10 DIAGNOSIS — R1312 Dysphagia, oropharyngeal phase: Secondary | ICD-10-CM | POA: Diagnosis not present

## 2017-01-10 DIAGNOSIS — Z430 Encounter for attention to tracheostomy: Secondary | ICD-10-CM | POA: Diagnosis not present

## 2017-01-10 LAB — URINALYSIS, ROUTINE W REFLEX MICROSCOPIC
BILIRUBIN URINE: NEGATIVE
GLUCOSE, UA: NEGATIVE mg/dL
KETONES UR: NEGATIVE mg/dL
NITRITE: NEGATIVE
PH: 7 (ref 5.0–8.0)
Protein, ur: 100 mg/dL — AB
Specific Gravity, Urine: 1.017 (ref 1.005–1.030)
Squamous Epithelial / LPF: NONE SEEN

## 2017-01-10 LAB — COMPREHENSIVE METABOLIC PANEL
ALT: 19 U/L (ref 17–63)
AST: 24 U/L (ref 15–41)
Albumin: 2.2 g/dL — ABNORMAL LOW (ref 3.5–5.0)
Alkaline Phosphatase: 79 U/L (ref 38–126)
Anion gap: 8 (ref 5–15)
BUN: 65 mg/dL — ABNORMAL HIGH (ref 6–20)
CHLORIDE: 101 mmol/L (ref 101–111)
CO2: 29 mmol/L (ref 22–32)
Calcium: 8.6 mg/dL — ABNORMAL LOW (ref 8.9–10.3)
Creatinine, Ser: 1.05 mg/dL (ref 0.61–1.24)
Glucose, Bld: 132 mg/dL — ABNORMAL HIGH (ref 65–99)
POTASSIUM: 3.8 mmol/L (ref 3.5–5.1)
SODIUM: 138 mmol/L (ref 135–145)
Total Bilirubin: 0.3 mg/dL (ref 0.3–1.2)
Total Protein: 6.1 g/dL — ABNORMAL LOW (ref 6.5–8.1)

## 2017-01-10 LAB — I-STAT CG4 LACTIC ACID, ED: LACTIC ACID, VENOUS: 1.09 mmol/L (ref 0.5–1.9)

## 2017-01-10 LAB — CBC WITH DIFFERENTIAL/PLATELET
BASOS ABS: 0 10*3/uL (ref 0.0–0.1)
Basophils Relative: 0 %
Eosinophils Absolute: 0 10*3/uL (ref 0.0–0.7)
Eosinophils Relative: 0 %
HEMATOCRIT: 22.6 % — AB (ref 39.0–52.0)
HEMOGLOBIN: 7.3 g/dL — AB (ref 13.0–17.0)
LYMPHS PCT: 8 %
Lymphs Abs: 1.1 10*3/uL (ref 0.7–4.0)
MCH: 28 pg (ref 26.0–34.0)
MCHC: 32.3 g/dL (ref 30.0–36.0)
MCV: 86.6 fL (ref 78.0–100.0)
Monocytes Absolute: 0.7 10*3/uL (ref 0.1–1.0)
Monocytes Relative: 5 %
NEUTROS ABS: 12 10*3/uL — AB (ref 1.7–7.7)
Neutrophils Relative %: 87 %
Platelets: 290 10*3/uL (ref 150–400)
RBC: 2.61 MIL/uL — AB (ref 4.22–5.81)
RDW: 18.2 % — ABNORMAL HIGH (ref 11.5–15.5)
WBC: 13.8 10*3/uL — AB (ref 4.0–10.5)

## 2017-01-10 LAB — PROTIME-INR
INR: 1.33
Prothrombin Time: 16.4 seconds — ABNORMAL HIGH (ref 11.4–15.2)

## 2017-01-10 LAB — TYPE AND SCREEN
ABO/RH(D): A POS
Antibody Screen: NEGATIVE

## 2017-01-10 LAB — POC OCCULT BLOOD, ED: Fecal Occult Bld: NEGATIVE

## 2017-01-10 MED ORDER — ADULT MULTIVITAMIN W/MINERALS CH: 1.0000 | ORAL_TABLET | Freq: Every day | ORAL | Status: DC

## 2017-01-10 MED ORDER — MINERAL OIL RE ENEM: 1.0000 | ENEMA | Freq: Two times a day (BID) | RECTAL | Status: DC | PRN

## 2017-01-10 MED ORDER — POLYETHYLENE GLYCOL 3350 17 G PO PACK: 17.0000 g | PACK | Freq: Every day | ORAL | Status: DC | PRN

## 2017-01-10 MED ORDER — SODIUM CHLORIDE 0.9 % IV SOLN
INTRAVENOUS | Status: AC
  Administered 2017-01-10 – 2017-01-11 (×2): via INTRAVENOUS

## 2017-01-10 MED ORDER — DIPHENHYDRAMINE HCL 25 MG PO CAPS
25.0000 mg | ORAL_CAPSULE | Freq: Four times a day (QID) | ORAL | Status: DC | PRN
  Administered 2017-01-11: 25 mg via ORAL
  Filled 2017-01-10: qty 1

## 2017-01-10 MED ORDER — LEVOTHYROXINE SODIUM 75 MCG PO TABS
75.0000 ug | ORAL_TABLET | Freq: Every day | ORAL | Status: DC
  Administered 2017-01-11 – 2017-01-14 (×4): 75 ug
  Filled 2017-01-10 (×4): qty 1

## 2017-01-10 MED ORDER — VITAMINS A & D EX OINT
1.0000 "application " | TOPICAL_OINTMENT | Freq: Every day | CUTANEOUS | Status: DC
  Administered 2017-01-12 – 2017-01-14 (×3): 1 via TOPICAL
  Filled 2017-01-10 (×4): qty 1

## 2017-01-10 MED ORDER — ENOXAPARIN SODIUM 40 MG/0.4ML ~~LOC~~ SOLN
40.0000 mg | SUBCUTANEOUS | Status: DC
  Administered 2017-01-12: 40 mg via SUBCUTANEOUS
  Filled 2017-01-10 (×2): qty 0.4

## 2017-01-10 MED ORDER — FLUTICASONE PROPIONATE 50 MCG/ACT NA SUSP: 1.0000 | Freq: Every day | NASAL | Status: DC | PRN

## 2017-01-10 MED ORDER — LORAZEPAM 1 MG PO TABS
1.0000 mg | ORAL_TABLET | ORAL | Status: DC | PRN
  Administered 2017-01-11 – 2017-01-12 (×4): 1 mg via ORAL
  Filled 2017-01-10 (×2): qty 1
  Filled 2017-01-10: qty 2
  Filled 2017-01-10: qty 1

## 2017-01-10 MED ORDER — MENTHOL-ZINC OXIDE 0.44-20.6 % EX OINT: TOPICAL_OINTMENT | Freq: Two times a day (BID) | CUTANEOUS | Status: DC

## 2017-01-10 MED ORDER — SERTRALINE HCL 50 MG PO TABS
50.0000 mg | ORAL_TABLET | Freq: Every day | ORAL | Status: DC
  Administered 2017-01-11 – 2017-01-14 (×4): 50 mg
  Filled 2017-01-10 (×4): qty 1

## 2017-01-10 MED ORDER — VITAMIN B-1 100 MG PO TABS
100.0000 mg | ORAL_TABLET | Freq: Every day | ORAL | Status: DC
  Administered 2017-01-12 – 2017-01-13 (×2): 100 mg
  Filled 2017-01-10 (×2): qty 1

## 2017-01-10 MED ORDER — METOCLOPRAMIDE HCL 5 MG/ML IJ SOLN
5.0000 mg | Freq: Three times a day (TID) | INTRAMUSCULAR | Status: DC
  Administered 2017-01-10 – 2017-01-12 (×3): 5 mg via INTRAVENOUS
  Filled 2017-01-10 (×3): qty 2

## 2017-01-10 MED ORDER — SCOPOLAMINE 1 MG/3DAYS TD PT72
1.0000 | MEDICATED_PATCH | TRANSDERMAL | Status: DC
  Administered 2017-01-11 – 2017-01-14 (×2): 1.5 mg via TRANSDERMAL
  Filled 2017-01-10 (×2): qty 1

## 2017-01-10 MED ORDER — FLUDROCORTISONE ACETATE 0.1 MG PO TABS
0.2000 mg | ORAL_TABLET | Freq: Every day | ORAL | Status: DC
  Administered 2017-01-11 – 2017-01-13 (×3): 0.2 mg
  Filled 2017-01-10 (×3): qty 2

## 2017-01-10 MED ORDER — POLYVINYL ALCOHOL 1.4 % OP SOLN
1.0000 [drp] | Freq: Three times a day (TID) | OPHTHALMIC | Status: DC
  Administered 2017-01-11 – 2017-01-14 (×9): 1 [drp] via OPHTHALMIC
  Filled 2017-01-10: qty 15

## 2017-01-10 MED ORDER — SODIUM CHLORIDE 0.9 % IV SOLN: 250.0000 mL | INTRAVENOUS | Status: DC | PRN

## 2017-01-10 MED ORDER — QUETIAPINE FUMARATE 25 MG PO TABS
50.0000 mg | ORAL_TABLET | Freq: Two times a day (BID) | ORAL | Status: DC
  Administered 2017-01-11 – 2017-01-14 (×8): 50 mg
  Filled 2017-01-10: qty 2
  Filled 2017-01-10: qty 1
  Filled 2017-01-10 (×2): qty 2
  Filled 2017-01-10: qty 1
  Filled 2017-01-10: qty 2
  Filled 2017-01-10: qty 1
  Filled 2017-01-10 (×2): qty 2
  Filled 2017-01-10: qty 1

## 2017-01-10 MED ORDER — SODIUM CHLORIDE 0.9 % IV BOLUS (SEPSIS)
1000.0000 mL | Freq: Once | INTRAVENOUS | Status: AC
  Administered 2017-01-10: 1000 mL via INTRAVENOUS

## 2017-01-10 MED ORDER — PANTOPRAZOLE SODIUM 40 MG IV SOLR
40.0000 mg | Freq: Two times a day (BID) | INTRAVENOUS | Status: DC
  Administered 2017-01-10 – 2017-01-12 (×3): 40 mg via INTRAVENOUS
  Filled 2017-01-10 (×3): qty 40

## 2017-01-10 MED ORDER — PRO-STAT SUGAR FREE PO LIQD
30.0000 mL | Freq: Every day | ORAL | Status: DC
  Administered 2017-01-12 – 2017-01-14 (×3): 30 mL
  Filled 2017-01-10 (×3): qty 30

## 2017-01-10 MED ORDER — ONDANSETRON HCL 4 MG PO TABS: 4.0000 mg | ORAL_TABLET | Freq: Four times a day (QID) | ORAL | Status: DC | PRN

## 2017-01-10 MED ORDER — LIDOCAINE VISCOUS 2 % MT SOLN
2.0000 mL | OROMUCOSAL | Status: DC | PRN
  Filled 2017-01-10: qty 5

## 2017-01-10 MED ORDER — SENNOSIDES-DOCUSATE SODIUM 8.6-50 MG PO TABS: 2.0000 | ORAL_TABLET | ORAL | Status: DC | PRN

## 2017-01-10 MED ORDER — ZINC SULFATE 220 (50 ZN) MG PO CAPS
220.0000 mg | ORAL_CAPSULE | Freq: Every day | ORAL | Status: DC
  Administered 2017-01-12 – 2017-01-14 (×3): 220 mg
  Filled 2017-01-10 (×4): qty 1

## 2017-01-10 MED ORDER — SUCRALFATE 1 G PO TABS
1.0000 g | ORAL_TABLET | Freq: Two times a day (BID) | ORAL | Status: DC
  Administered 2017-01-11 – 2017-01-14 (×6): 1 g
  Filled 2017-01-10 (×6): qty 1

## 2017-01-10 MED ORDER — BISACODYL 10 MG RE SUPP: 10.0000 mg | RECTAL | Status: DC | PRN

## 2017-01-10 MED ORDER — GABAPENTIN 300 MG PO CAPS
300.0000 mg | ORAL_CAPSULE | Freq: Three times a day (TID) | ORAL | Status: DC
  Administered 2017-01-11 – 2017-01-14 (×9): 300 mg
  Filled 2017-01-10 (×9): qty 1

## 2017-01-10 MED ORDER — SODIUM CHLORIDE 0.9 % IV SOLN
1000.0000 mL | INTRAVENOUS | Status: DC
  Administered 2017-01-10: 1000 mL via INTRAVENOUS

## 2017-01-10 MED ORDER — VITAMIN C 500 MG PO TABS
500.0000 mg | ORAL_TABLET | Freq: Two times a day (BID) | ORAL | Status: DC
  Administered 2017-01-11 – 2017-01-13 (×4): 500 mg
  Filled 2017-01-10 (×4): qty 1

## 2017-01-10 MED ORDER — SODIUM CHLORIDE 0.9% FLUSH
3.0000 mL | INTRAVENOUS | Status: DC | PRN
Start: 2017-01-10 — End: 2017-01-12

## 2017-01-10 MED ORDER — CLONAZEPAM 0.5 MG PO TABS
0.5000 mg | ORAL_TABLET | Freq: Every day | ORAL | Status: DC
  Administered 2017-01-11 – 2017-01-14 (×4): 0.5 mg
  Filled 2017-01-10 (×4): qty 1

## 2017-01-10 MED ORDER — DEXTROSE 5 % IV SOLN
2.0000 g | Freq: Three times a day (TID) | INTRAVENOUS | Status: DC
  Administered 2017-01-10 – 2017-01-12 (×7): 2 g via INTRAVENOUS
  Filled 2017-01-10 (×10): qty 2

## 2017-01-10 MED ORDER — ALBUTEROL SULFATE (2.5 MG/3ML) 0.083% IN NEBU: 2.5000 mg | INHALATION_SOLUTION | RESPIRATORY_TRACT | Status: DC | PRN

## 2017-01-10 MED ORDER — SODIUM CHLORIDE 0.9% FLUSH
3.0000 mL | Freq: Two times a day (BID) | INTRAVENOUS | Status: DC
  Administered 2017-01-12: 3 mL via INTRAVENOUS

## 2017-01-10 MED ORDER — ASPIRIN 81 MG PO CHEW
81.0000 mg | CHEWABLE_TABLET | Freq: Every day | ORAL | Status: DC
  Administered 2017-01-12 – 2017-01-13 (×2): 81 mg
  Filled 2017-01-10 (×2): qty 1

## 2017-01-10 MED ORDER — CHLORHEXIDINE GLUCONATE 0.12 % MT SOLN
15.0000 mL | Freq: Two times a day (BID) | OROMUCOSAL | Status: DC
  Administered 2017-01-11 – 2017-01-14 (×6): 15 mL via OROMUCOSAL
  Filled 2017-01-10 (×8): qty 15

## 2017-01-10 MED ORDER — SIMETHICONE 80 MG PO CHEW
160.0000 mg | CHEWABLE_TABLET | Freq: Two times a day (BID) | ORAL | Status: DC
  Administered 2017-01-11 – 2017-01-13 (×4): 160 mg
  Filled 2017-01-10 (×5): qty 2

## 2017-01-10 MED ORDER — FREE WATER
200.0000 mL | Freq: Two times a day (BID) | Status: DC
  Administered 2017-01-11 – 2017-01-14 (×6): 200 mL

## 2017-01-10 MED ORDER — VANCOMYCIN HCL IN DEXTROSE 1-5 GM/200ML-% IV SOLN
1000.0000 mg | Freq: Once | INTRAVENOUS | Status: AC
  Administered 2017-01-10: 1000 mg via INTRAVENOUS
  Filled 2017-01-10: qty 200

## 2017-01-10 NOTE — H&P (Addendum)
TRH H&P   Patient Demographics:    Marc Rose, is a 66 y.o. male  MRN: [id number]   DOB - [birth year]  Admit Date - 01/10/2017  Outpatient Primary MD for the patient is Patient, [name]   Referring MD/NP/PA: [name]  Outpatient Specialists:   Patient coming from: [facility]  Chief Complaint  Patient presents with  . Hypotension      HPI:    Marc Rose  is a 66 y.o. 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 He also has a h/o Anxiety, OA, Chronic pain, DM, HTN, PAD, and Orthostatic hypotension. He was transferred from [facility] to the ER on 8/12 w/ Fever and acute hypoxic resp failure. On arrival to the ER he was in shock with BP 50/30 and minimally responsive only withdrawing to pain. He had foul smelling urine, a large unstageable ulcer on his foot and pulmonary infiltrates on CXR.Admitted by pulmonary critical care. 8/15: bronchoscopy- Obstruction RLL, RML, BI, lavaged(pus)-  brushing performed  8/15- care transferred to hospitalist service Tracheal aspirate grew Pseudomonas. Pt was discharge on 8/22 on levaquin/ doxycycline.    Pt today apparently had hypotension, and blood in foley catheter.   CXR => 9/10 at [facility]=> increasing diffuse interstial opacities may represent increasing pneumonitis or pneumonia.    CXR 9/10=> [facility] IMPRESSION: Improving airspace opacity throughout the right lung with residual right upper lobe scarring. New patchy opacity in the left mid lung, question pneumonia.  Wbc 13.8, Hgb 7.3, Plt 290.  Bun/creat 65/1.05,  Glucose 132,  Pt will be admitted for hcap, and hypotension bp 88/60.    Review of systems:    In addition to the  HPI above, No Fever-chills, No Headache, No changes with Vision or hearing, No problems swallowing food or Liquids, No Chest pain, or Shortness of Breath, No Abdominal pain, No Nausea or Vommitting, Bowel movements are regular, No Blood in stool No dysuria, No new skin rashes or bruises, No new joints pains-aches,  No new weakness, tingling, numbness in any extremity, No recent weight gain or loss, No polyuria, polydypsia or polyphagia, No significant Mental Stressors.  A full 10 point Review of Systems was done, except as stated above, all other Review of Systems were negative.   With Past History of the following :    Past Medical History:  Diagnosis Date  . Anxiety state, unspecified   . Arthritis   . 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 Benign neoplasm of colon   . Causalgia of upper limb    Left limb  . Chronic back pain   . Chronic leg pain   . Depression   . Diabetes mellitus   . Diskitis 02/05/2015  .  Drug rash 11/12/2015  . Elevated blood pressure reading without diagnosis of hypertension   . Essential hypertension, benign   . Hardware complicating wound infection ([place]) 02/05/2015  . Hyperpigmentation 05/15/2015  . Hypertension   . Impaired fasting glucose   . Peripheral arterial disease ([place])   . Pure hypercholesterolemia   . Tobacco use disorder   . Unspecified retinal detachment       Past Surgical History:  Procedure Laterality Date  . ANKLE SURGERY     left  . APPENDECTOMY    . BACK SURGERY    . COLONOSCOPY    . KNEE SURGERY     left      Social History:     Social History  Substance Use Topics  . Smoking status: Current Every Day Smoker    Packs/day: 0.50    Years: 39.00    Types: Cigarettes  . Smokeless tobacco: Never Used  . Alcohol use No     Comment: occasionally     Lives - at [facility] - unclear   Family History :     Family History  Problem Relation Age of Onset  . COPD Mother   . Hyperthyroidism Mother   .  Hyperthyroidism Sister       Home Medications:   Prior to Admission medications   Medication Sig Start Date End Date Taking? Authorizing Provider  acetaminophen (TYLENOL) 650 MG CR tablet Take 650 mg by mouth every 8 (eight) hours as needed for pain. Per tube    [provider]  albuterol (PROVENTIL) (2.5 MG/3ML) 0.083% nebulizer solution Take 3 mLs (2.5 mg total) by nebulization every 2 (two) hours as needed for wheezing. 12/22/16   Debbe Odea, MD  Amino Acids-Protein Hydrolys (FEEDING SUPPLEMENT, PRO-STAT SUGAR FREE 64,) LIQD Place 30 mLs into feeding tube daily.    [provider]  aspirin 81 MG chewable tablet Place 81 mg into feeding tube daily.    [provider]  bisacodyl (DULCOLAX) 10 MG suppository Place 10 mg rectally as needed for moderate constipation.    [provider]  chlorhexidine (PERIDEX) 0.12 % solution Use as directed 15 mLs in the mouth or throat 2 (two) times daily.    [provider]  clonazePAM (KLONOPIN) 0.5 MG tablet Place 0.5 mg into feeding tube daily.    [provider]  diphenhydrAMINE (BENADRYL) 25 MG tablet Take 25 mg by mouth every 6 (six) hours as needed for itching. Per tube    [provider]  doxycycline (ADOXA) 100 MG tablet Take 1 tablet (100 mg total) by mouth 2 (two) times daily. 12/22/16   Debbe Odea, MD  fludrocortisone (FLORINEF) 0.1 MG tablet Place 0.2 mg into feeding tube daily.    [provider]  fluticasone (FLONASE) 50 MCG/ACT nasal spray Place 1 spray into both nostrils daily as needed for allergies.  07/04/14   [provider]  gabapentin (NEURONTIN) 300 MG capsule Place 300 mg into feeding tube 3 (three) times daily.    [provider]  ipratropium-albuterol (DUONEB) 0.5-2.5 (3) MG/3ML SOLN Take 3 mLs by nebulization every 6 (six) hours as needed (for SOB).    [provider]  levofloxacin (LEVAQUIN) 25 MG/ML solution Take 30 mLs (750 mg  total) by mouth daily. 12/22/16   Debbe Odea, MD  levothyroxine (SYNTHROID, LEVOTHROID) 75 MCG tablet Place 75 mcg into feeding tube daily before breakfast.    [provider]  lidocaine (XYLOCAINE) 2 % solution Use  as directed 2 mLs in the mouth or throat every 4 (four) hours as needed for mouth pain.    [provider]  LORazepam (ATIVAN) 2 MG/ML concentrated solution Take 1 mg by mouth every 4 (four) hours as needed for anxiety.    [provider]  Menthol-Zinc Oxide (CALMOSEPTINE) 0.44-20.6 % OINT Apply topically 2 (two) times daily. To groin and buttocks    [provider]  metoCLOPramide (REGLAN) 5 MG/ML injection Inject 5 mg into the vein every 8 (eight) hours.    [provider]  mineral oil enema Place 1 enema rectally every 12 (twelve) hours as needed for mild constipation or severe constipation.    [provider]  Multiple Vitamin (MULTIVITAMIN WITH MINERALS) TABS tablet Place 1 tablet into feeding tube daily.    [provider]  ondansetron (ZOFRAN) 4 MG tablet Place 1 tablet (4 mg total) into feeding tube every 6 (six) hours as needed for nausea. 06/25/16   [name], MD  pantoprazole (PROTONIX) 40 MG injection Inject 40 mg into the vein daily. Patient taking differently: Inject 40 mg into the vein 2 (two) times daily.  06/26/16   [name], MD  polyethylene glycol (MIRALAX / GLYCOLAX) packet Place 17 g into feeding tube daily as needed for mild constipation.    [provider]  polyvinyl alcohol (LIQUIFILM TEARS) 1.4 % ophthalmic solution Place 1 drop into both eyes 3 (three) times daily. 06/25/16   [name], MD  QUEtiapine (SEROQUEL) 50 MG tablet Place 50 mg into feeding tube 2 (two) times daily.    [provider]  scopolamine (TRANSDERM-SCOP) 1 MG/3DAYS Place 1 patch (1.5 mg total) onto the skin every 3 (three) days. 12/24/16   Debbe Odea, MD  senna-docusate (SENOKOT-S) 8.6-50 MG  tablet Place 1 tablet into feeding tube at bedtime. Patient taking differently: Place 2 tablets into feeding tube as needed for mild constipation.  06/25/16   [name], MD  sertraline (ZOLOFT) 50 MG tablet Place 50 mg into feeding tube daily.    [provider]  simethicone (MYLICON) 80 MG chewable tablet Place 160 mg into feeding tube 2 times daily at 12 noon and 4 pm.    [provider]  sucralfate (CARAFATE) 1 g tablet Place 1 g into feeding tube 2 (two) times daily.    [provider]  thiamine (VITAMIN B-1) 100 MG tablet Place 100 mg into feeding tube daily.    [provider]  thiamine 100 MG tablet Place 1 tablet (100 mg total) into feeding tube daily. Patient not taking: Reported on 12/12/2016 06/26/16   [name], MD  vitamin C (ASCORBIC ACID) 500 MG tablet Place 500 mg into feeding tube 2 (two) times daily.    [provider]  Vitamins A & D (VITAMIN A & D) ointment Apply 1 application topically daily.    [provider]  Water For Irrigation, Sterile (FREE WATER) SOLN Place 200 mLs into feeding tube 2 (two) times daily. 12/22/16   Debbe Odea, MD  zinc sulfate 220 (50 Zn) MG capsule Place 220 mg into feeding tube daily.    [provider]     Allergies:     Allergies  Allergen Reactions  . Codeine Anaphylaxis, Hives and Swelling  . Penicillins Anaphylaxis, Hives and Swelling  . Latex Itching  . Strawberry Extract Hives     Physical Exam:   Vitals  Blood pressure (!) 89/57, pulse 77, temperature 98.6 F (37 C), temperature  source Axillary, resp. rate 19, SpO2 96 %.   1. General  lying in bed in NAD,    2. Normal affect and insight, Not Suicidal or Homicidal, Awake Alert, difficult to tell orientation, responds to name. .  3. No F.N deficits, ALL C.Nerves Intact, Strength 5/5 all 4 extremities, Sensation intact all 4 extremities, Plantars down going.  4. Ears and Eyes appear Normal, Conjunctivae  clear, PERRLA. Moist Oral Mucosa.  5. Supple Neck, No JVD, No cervical lymphadenopathy appriciated, No Carotid Bruits.  6. Symmetrical Chest wall movement, Good air movement bilaterally, bibasilar crackles  7. RRR, No Gallops, Rubs or Murmurs, No Parasternal Heave.  8. Positive Bowel Sounds, Abdomen Soft, No tenderness, No organomegaly appriciated,No rebound -guarding or rigidity.  9.  No Cyanosis, Normal Skin Turgor, No Skin Rash or Bruise.  10. Good muscle tone,  joints appear normal , no effusions, Normal ROM.  11. No Palpable Lymph Nodes in Neck or Axillae     Data Review:    CBC  Recent Labs Lab 01/10/17 1559  WBC 13.8*  HGB 7.3*  HCT 22.6*  PLT 290  MCV 86.6  MCH 28.0  MCHC 32.3  RDW 18.2*  LYMPHSABS 1.1  MONOABS 0.7  EOSABS 0.0  BASOSABS 0.0   ------------------------------------------------------------------------------------------------------------------  Chemistries   Recent Labs Lab 01/10/17 1559  NA 138  K 3.8  CL 101  CO2 29  GLUCOSE 132*  BUN 65*  CREATININE 1.05  CALCIUM 8.6*  AST 24  ALT 19  ALKPHOS 79  BILITOT 0.3   ------------------------------------------------------------------------------------------------------------------ CrCl cannot be calculated (Unknown ideal weight.). ------------------------------------------------------------------------------------------------------------------ No results for input(s): TSH, T4TOTAL, T3FREE, THYROIDAB in the last 72 hours.  Invalid input(s): FREET3  Coagulation profile  Recent Labs Lab 01/10/17 1559  INR 1.33   ------------------------------------------------------------------------------------------------------------------- No results for input(s): DDIMER in the last 72 hours. -------------------------------------------------------------------------------------------------------------------  Cardiac Enzymes No results for input(s): CKMB, TROPONINI, MYOGLOBIN in the last 168  hours.  Invalid input(s): CK ------------------------------------------------------------------------------------------------------------------ No results found for: BNP   ---------------------------------------------------------------------------------------------------------------  Urinalysis    Component Value Date/Time   COLORURINE PINK (A) 01/10/2017 1725   APPEARANCEUR CLOUDY (A) 01/10/2017 1725   LABSPEC 1.017 01/10/2017 1725   PHURINE 7.0 01/10/2017 1725   GLUCOSEU NEGATIVE 01/10/2017 1725   HGBUR LARGE (A) 01/10/2017 1725   BILIRUBINUR NEGATIVE 01/10/2017 1725   KETONESUR NEGATIVE 01/10/2017 1725   PROTEINUR 100 (A) 01/10/2017 1725   UROBILINOGEN 1.0 09/16/2013 2001   NITRITE NEGATIVE 01/10/2017 1725   LEUKOCYTESUR LARGE (A) 01/10/2017 1725    ----------------------------------------------------------------------------------------------------------------   Imaging Results:    Dg Chest Portable 1 View  Result Date: 01/10/2017 CLINICAL DATA:  Rule out infection EXAM: PORTABLE CHEST 1 VIEW COMPARISON:  12/20/2016 FINDINGS: Tracheostomy remains in place, unchanged. Previously seen patchy airspace opacities throughout the right lung have improved with mild residual opacity in the right upper lobe, favor scarring. New patchy airspace disease in the left upper lobe in the left mid lung, cannot exclude pneumonia. Heart is normal size. No effusions. IMPRESSION: Improving airspace opacity throughout the right lung with residual right upper lobe scarring. New patchy opacity in the left mid lung, question pneumonia. Electronically Signed   By: Rolm Baptise M.D.   On: 01/10/2017 18:20       Assessment & Plan:    Active Problems:   Hypotension   Hyperglycemia   Miller-Fisher variant Guillain-Barre syndrome (HCC)   HCAP (healthcare-associated pneumonia)   Normochromic normocytic anemia   Leukocytosis    Hypotension 12  lead ekg ua pending Cortisol Trop I q6h x3 Check  cardiac echo Ns iv  Hcap Blood culture x2 Tracheal aspirate vanco iv, cefepime iv pharmacy to dose  Leukocytosis secondary to hcap Repeat cbc in am  Anemia Repeat cbc q6h x4 FOBT  Dehydration  Ns iv   Hyperglycemia Check hga1c  Hypothyroidism Cont levothyroxine  DVT Prophylaxis Lovenox  , SCDs   AM Labs Ordered, also please review Full Orders  Family Communication: Admission, patients condition and plan of care including tests being ordered have been discussed with the patient  who indicate understanding and agree with the plan and Code Status.  Code Status FULL CODE  Likely DC to  [facility]  Condition GUARDED    Consults called: none  Admission status: inpatient  Time spent in minutes : 45   Jani Gravel M.D on 01/10/2017 at 7:34 PM  Between 7am to 7pm - Pager - [phone]. After 7pm go to [email or web address] - password [facility]  [facility] - Office  [phone]

## 2017-01-10 NOTE — ED Notes (Signed)
Patient now answering questions verbally, able to whisper one word answers. Admitting MD at bedside.

## 2017-01-10 NOTE — Progress Notes (Signed)
Pharmacy Antibiotic Note  Marc Rose is a 66 y.o. male admitted on 01/10/2017 with pneumonia.  Pharmacy has been consulted for vancomycin dosing x8 days. Patient also on aztreonam per MD. Noted PCN allergy that causes anaphylaxis.   Patient is afebrile and WBC elevated at 13.8. SCr 1.05 for estimated CrCl ~ 60-65 mL/min.   Plan: Vancomycin 1g IV x1, then 750 mg IV q12hr Aztreonam 2g IV q8hr per MD  Monitor renal function, clinical picture, and culture data Vancomycin trough at [facility] and as needed (goal 15-20 mcg/mL)  Temp (24hrs), Avg:98.6 F (37 C), Min:98.6 F (37 C), Max:98.6 F (37 C)   Recent Labs Lab 01/10/17 1559 01/10/17 1613  WBC 13.8*  --   CREATININE 1.05  --   LATICACIDVEN  --  1.09    CrCl cannot be calculated (Unknown ideal weight.).    Allergies  Allergen Reactions  . Codeine Anaphylaxis, Hives and Swelling  . Penicillins Anaphylaxis, Hives and Swelling    Has patient had a PCN reaction causing immediate rash, facial/tongue/throat swelling, SOB or lightheadedness with hypotension: Yes Has patient had a PCN reaction causing severe rash involving mucus membranes or skin necrosis: Unknown Has patient had a PCN reaction that required hospitalization: Unknown Has patient had a PCN reaction occurring within the last 10 years: Unknown If all of the above answers are "NO", then may proceed with Cephalosporin use.   . Latex Itching  . Strawberry Extract Hives    Antimicrobials this admission: 9/11 Vanc >>  9/11 Aztreonam >>  Microbiology results: pending   Argie Ramming, PharmD Clinical Pharmacist 01/11/17 12:02 AM

## 2017-01-10 NOTE — Progress Notes (Signed)
Called to pts room patient needed to be suctioned.  Removed t-bar venti set up from patient since he is being admitted and requires humidification with H20 set up and trach collar @ 40%.. Suctioned patient 3 times patient has a good cough when it is generated.  RN is ordering more 14 french suction cath there are none in bulk ED supply.  RT will continue to monitor patient and assess respiratory status.

## 2017-01-10 NOTE — ED Provider Notes (Signed)
[facility] DEPT Provider Note   CSN: [id number] Arrival date & time: 01/10/17  1546     History   Chief Complaint Chief Complaint  Patient presents with  . Hypotension    HPI Marc Rose is a 66 y.o. male.  HPI Patient has significant complex comorbid illness. He is sent from [facility] for low blood pressure. They also report having visualized blood in his trach when suctioning. Also have noted blood in urinary catheter. Very limited additional history of recent problems or changes. EMR review indicates last hospital discharge 8\22 for recurrent fever and HCAP. Past Medical History:  Diagnosis Date  . Anxiety state, unspecified   . Arthritis   . 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 Benign neoplasm of colon   . Causalgia of upper limb    Left limb  . Chronic back pain   . Chronic leg pain   . Depression   . Diabetes mellitus   . Diskitis 02/05/2015  . Drug rash 11/12/2015  . Elevated blood pressure reading without diagnosis of hypertension   . Essential hypertension, benign   . Hardware complicating wound infection ([place]) 02/05/2015  . Hyperpigmentation 05/15/2015  . Hypertension   . Impaired fasting glucose   . Peripheral arterial disease ([place])   . Pure hypercholesterolemia   . Tobacco use disorder   . Unspecified retinal detachment     Patient Active Problem List   Diagnosis Date Noted  . Leukocytosis 01/10/2017  . Palliative care by specialist   . Tracheostomy status ([place])   . Acute on chronic respiratory failure with hypoxemia ([place])   . Protein-calorie malnutrition, severe 12/14/2016  . HCAP (healthcare-associated pneumonia)   . Sepsis due to undetermined organism ([place])   . Normochromic normocytic anemia   . Septic shock ([place]) 12/12/2016  . DNR (do not resuscitate) discussion   . Urinary tract infection without hematuria   . Pressure injury of skin 06/21/2016  . Palliative care encounter   . Goals of care, counseling/discussion   . 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 Acute respiratory failure ([place]) 06/10/2016  . Malnutrition of moderate degree 06/10/2016  . Ataxic gait   . Muscle weakness (generalized)   . 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 Acute kidney injury (nontraumatic) ([place]) 06/04/2016  . Drug rash 11/12/2015  . Hyperpigmentation 05/15/2015  . Abdominal pain, right lower quadrant 02/05/2015  . Abscess, psoas ([place]) 02/05/2015  . Acute upper respiratory infection 02/05/2015  . Long term current use of antibiotics 02/05/2015  . Benign essential HTN 02/05/2015  . Edema leg 02/05/2015  . Complex regional pain syndrome type 2 of upper extremity 02/05/2015  . Back pain, chronic 02/05/2015  . CN (constipation) 02/05/2015  . CD (contact dermatitis) 02/05/2015  . Hyperglycemia 02/05/2015  . LBP (low back pain) 02/05/2015  . Discitis of lumbar region 02/05/2015  . Disorder of nutrition 02/05/2015  . Feeling bilious 02/05/2015  . Flu vaccine need 02/05/2015  . Peripheral neuropathic pain 02/05/2015  . Non compliance with medical treatment 02/05/2015  . Abnormal blood chemistry 02/05/2015  . Angiopathy, peripheral ([place]) 02/05/2015  . Nerve disease, peripheral 02/05/2015  . Hypercholesterolemia without hypertriglyceridemia 02/05/2015  . Spinal stenosis 02/05/2015  . Buzzing in ear 02/05/2015  . Compulsive tobacco user syndrome 02/05/2015  . Bladder retention 02/05/2015  . Abnormal weight loss 02/05/2015  . Diskitis 02/05/2015  . Hardware complicating wound infection ([place]) 02/05/2015  . Hyperlipidemia 07/05/2014  . Peripheral arterial disease ([place]) 07/05/2014  .  Hypotension 09/16/2013  . HTN (hypertension) 09/16/2013  . Anxiety 09/16/2013  . Back pain 09/16/2013    Past Surgical History:  Procedure Laterality Date  . ANKLE SURGERY     left  . APPENDECTOMY    . BACK SURGERY    . COLONOSCOPY    . KNEE SURGERY     left       Home Medications    Prior  to Admission medications   Medication Sig Start Date End Date Taking? Authorizing Provider  acetaminophen (TYLENOL) 650 MG CR tablet 650 mg See admin instructions. 650 mg per PEG-Tube every 8 hours as needed for pain   Yes [provider]  Allantoin-Pramoxine (NEOSPORIN LT) 1.5-1 % OINT Apply 1 application topically See admin instructions. TO ABRASIONS ON THE TOES OF RIGHT FOOT   Yes [provider]  Amino Acids-Protein Hydrolys (FEEDING SUPPLEMENT, PRO-STAT 64,) LIQD Place 30 mLs into feeding tube daily.   Yes [provider]  aspirin 81 MG chewable tablet Place 81 mg into feeding tube daily.   Yes [provider]  bisacodyl (DULCOLAX) 10 MG suppository Place 10 mg rectally as needed for moderate constipation.   Yes [provider]  chlorhexidine (PERIDEX) 0.12 % solution Use as directed 15 mLs in the mouth or throat 2 (two) times daily.   Yes [provider]  clonazePAM (KLONOPIN) 0.5 MG tablet Place 0.5 mg into feeding tube daily.   Yes [provider]  diphenhydrAMINE (BENADRYL) 25 MG tablet 25 mg See admin instructions. 25 mg per PEG-Tube every 6 hours as needed for itching   Yes [provider]  fludrocortisone (FLORINEF) 0.1 MG tablet Place 0.2 mg into feeding tube daily.   Yes [provider]  fluticasone (FLONASE) 50 MCG/ACT nasal spray Place 1 spray into both nostrils daily.  07/04/14  Yes [provider]  gabapentin (NEURONTIN) 300 MG capsule Place 300 mg into feeding tube 3 (three) times daily.   Yes [provider]  ipratropium-albuterol (DUONEB) 0.5-2.5 (3) MG/3ML SOLN Take 3 mLs by nebulization every 6 (six) hours.    Yes [provider]  levothyroxine (SYNTHROID, LEVOTHROID) 75 MCG tablet Place 75 mcg into feeding tube daily before breakfast.   Yes [provider]  LORazepam (ATIVAN) 2 MG/ML concentrated solution 1 mg See admin instructions. 1 mg PER IV EVERY 4 HOURS AS  NEEDED FOR ITCHING   Yes [provider]  Menthol-Zinc Oxide (CALMOSEPTINE) 0.44-20.6 % OINT Apply topically 2 (two) times daily. To groin and buttocks   Yes [provider]  metoCLOPramide (REGLAN) 5 MG/ML injection Inject 5 mg into the vein every 8 (eight) hours.   Yes [provider]  mineral oil enema Place 1 enema rectally once as needed for mild constipation or severe constipation.    Yes [provider]  Multiple Vitamin (MULTIVITAMIN WITH MINERALS) TABS tablet Place 1 tablet into feeding tube daily.   Yes [provider]  Nutritional Supplements (FEEDING SUPPLEMENT, VITAL 1.5 CAL,) LIQD Place 60 mLs into feeding tube See admin instructions. "Every shift @@ 60 ml's/hr"   Yes [provider]  ondansetron (ZOFRAN) 4 MG tablet Place 1 tablet (4 mg total) into feeding tube every 6 (six) hours as needed for nausea. 06/25/16  Yes [name], MD  pantoprazole (PROTONIX) 40 MG injection Inject 40 mg into the vein daily. Patient taking differently: Inject 40 mg into the vein daily.  06/26/16  Yes [name], MD  polyethylene glycol (MIRALAX / GLYCOLAX)  packet Place 17 g into feeding tube daily as needed for mild constipation.   Yes [provider]  polyvinyl alcohol (LIQUIFILM TEARS) 1.4 % ophthalmic solution Place 1 drop into both eyes 3 (three) times daily. 06/25/16  Yes [name], MD  QUEtiapine (SEROQUEL) 50 MG tablet Place 50 mg into feeding tube 2 (two) times daily.   Yes [provider]  scopolamine (TRANSDERM-SCOP) 1 MG/3DAYS Place 1 patch (1.5 mg total) onto the skin every 3 (three) days. 12/24/16  Yes Rizwan, Eunice Blase, MD  senna-docusate (SENOKOT-S) 8.6-50 MG tablet Place 1 tablet into feeding tube at bedtime. 06/25/16  Yes [name], MD  sertraline (ZOLOFT) 50 MG tablet Place 50 mg into feeding tube daily.   Yes [provider]  simethicone (MYLICON) 80 MG chewable tablet Place 160 mg into feeding  tube 2 times daily at 12 noon and 4 pm.   Yes [provider]  sucralfate (CARAFATE) 1 g tablet Place 1 g into feeding tube 2 (two) times daily.   Yes [provider]  thiamine (VITAMIN B-1) 100 MG tablet Place 100 mg into feeding tube daily.   Yes [provider]  thiamine 100 MG tablet Place 1 tablet (100 mg total) into feeding tube daily. 06/26/16  Yes [name], MD  vitamin C (ASCORBIC ACID) 500 MG tablet Place 500 mg into feeding tube 2 (two) times daily.   Yes [provider]  Vitamins A & D (VITAMIN A & D) ointment Apply 1 application topically daily.   Yes [provider]  zinc sulfate 220 (50 Zn) MG capsule Place 220 mg into feeding tube daily.   Yes [provider]    Family History Family History  Problem Relation Age of Onset  . COPD Mother   . Hyperthyroidism Mother   . Hyperthyroidism Sister     Social History Social History  Substance Use Topics  . Smoking status: Current Every Day Smoker    Packs/day: 0.50    Years: 39.00    Types: Cigarettes  . Smokeless tobacco: Never Used  . Alcohol use No     Comment: occasionally     Allergies   Codeine; Penicillins; Latex; and Strawberry extract   Review of Systems Review of Systems Level 5 caveat cannot obtain review systems due to patient condition and dementia.  Physical Exam Updated Vital Signs BP 97/60   Pulse 89   Temp 98.6 F (37 C) (Axillary)   Resp (!) 28   SpO2 96%   Physical Exam  Constitutional:  Patient is extremely deconditioned appearing male. Cachectic. Somnolent. Tracheostomy with continuous oxygen.  HENT:  Head: Normocephalic and atraumatic.  Mucous membranes moist.  Eyes:  Patient does not follow commands for extraocular motions but grossly intact.  Cardiovascular: Normal rate and regular rhythm.   Pulmonary/Chest:  Patient does not make effort for deep inspiration for exam. Breath sounds fairly quiet throughout. No gross  rhonchi, wheeze or rail.  Abdominal:  Abdomen is flat. Patient has feeding tube in place. Clean dry and intact.  Musculoskeletal:  Extremities are very cachectic. Patient keeps lower extremities flexed and crossed. Very minor superficial abrasions to toes but patient is wearing padded boots. No significant edema or signs of secondary infection at this time.  Neurological:  Patient is generally fairly somnolent. He does not follow commands. He occasionally opens eyes to verbal commands. He makes limited but purposeful movements of arms and legs.  Skin: Skin is warm and dry. There is pallor.  ED Treatments / Results  Labs (all labs ordered are listed, but only abnormal results are displayed) Labs Reviewed  COMPREHENSIVE METABOLIC PANEL - Abnormal; Notable for the following:       Result Value   Glucose, Bld 132 (*)    BUN 65 (*)    Calcium 8.6 (*)    Total Protein 6.1 (*)    Albumin 2.2 (*)    All other components within normal limits  CBC WITH DIFFERENTIAL/PLATELET - Abnormal; Notable for the following:    WBC 13.8 (*)    RBC 2.61 (*)    Hemoglobin 7.3 (*)    HCT 22.6 (*)    RDW 18.2 (*)    Neutro Abs 12.0 (*)    All other components within normal limits  PROTIME-INR - Abnormal; Notable for the following:    Prothrombin Time 16.4 (*)    All other components within normal limits  URINALYSIS, ROUTINE W REFLEX MICROSCOPIC - Abnormal; Notable for the following:    Color, Urine PINK (*)    APPearance CLOUDY (*)    Hgb urine dipstick LARGE (*)    Protein, ur 100 (*)    Leukocytes, UA LARGE (*)    Bacteria, UA MANY (*)    All other components within normal limits  CULTURE, BLOOD (ROUTINE X 2)  CULTURE, BLOOD (ROUTINE X 2)  URINE CULTURE  HEMOGLOBIN A1C  FERRITIN  IRON AND TIBC  FOLATE RBC  VITAMIN B12  I-STAT CG4 LACTIC ACID, ED  POC OCCULT BLOOD, ED  I-STAT CG4 LACTIC ACID, ED  TYPE AND SCREEN    EKG  EKG Interpretation None       Radiology Dg Chest  Portable 1 View  Result Date: 01/10/2017 CLINICAL DATA:  Rule out infection EXAM: PORTABLE CHEST 1 VIEW COMPARISON:  12/20/2016 FINDINGS: Tracheostomy remains in place, unchanged. Previously seen patchy airspace opacities throughout the right lung have improved with mild residual opacity in the right upper lobe, favor scarring. New patchy airspace disease in the left upper lobe in the left mid lung, cannot exclude pneumonia. Heart is normal size. No effusions. IMPRESSION: Improving airspace opacity throughout the right lung with residual right upper lobe scarring. New patchy opacity in the left mid lung, question pneumonia. Electronically Signed   By: Rolm Baptise M.D.   On: 01/10/2017 18:20    Procedures Procedures (including critical care time) CRITICAL CARE Performed by: Charlesetta Shanks   Total critical care time: 30 minutes  Critical care time was exclusive of separately billable procedures and treating other patients.  Critical care was necessary to treat or prevent imminent or life-threatening deterioration.  Critical care was time spent personally by me on the following activities: development of treatment plan with patient and/or surrogate as well as nursing, discussions with consultants, evaluation of patient's response to treatment, examination of patient, obtaining history from patient or surrogate, ordering and performing treatments and interventions, ordering and review of laboratory studies, ordering and review of radiographic studies, pulse oximetry and re-evaluation of patient's condition. Medications Ordered in ED Medications  aztreonam (AZACTAM) 2 g in dextrose 5 % 50 mL IVPB (0 g Intravenous Stopped 01/10/17 2113)  sodium chloride 0.9 % bolus 1,000 mL (0 mLs Intravenous Stopped 01/10/17 1957)    Followed by  sodium chloride 0.9 % bolus 1,000 mL (0 mLs Intravenous Stopped 01/10/17 2043)    Followed by  0.9 %  sodium chloride infusion (1,000 mLs Intravenous New Bag/Given  01/10/17 2043)  vancomycin (VANCOCIN) IVPB 1000 mg/200 mL  premix (0 mg Intravenous Stopped 01/10/17 1958)     Initial Impression / Assessment and Plan / ED Course  I have reviewed the triage vital signs and the nursing notes.  Pertinent labs & imaging results that were available during my care of the patient were reviewed by me and considered in my medical decision making (see chart for details).      Final Clinical Impressions(s) / ED Diagnoses   Final diagnoses:  Hypotension, unspecified hypotension type  Healthcare-associated pneumonia  Dehydration  AKI (acute kidney injury) ([employer])  At baseline patient appears to have severely debilitated state. He is referred to the emergency department for hypotension. Chest x-ray shows new area of consolidation suggestive of possible pneumonia.HCAP antibiotics initiated, fluid hydration administered. Patient is also anemic but does not appear to have acute GI bleed. Stool is heme-negative. At this time, etiology of anemia uncertain. Will admit for ongoing evaluation and treatment.  New Prescriptions New Prescriptions   No medications on file     Charlesetta Shanks, MD 01/10/17 2157

## 2017-01-10 NOTE — ED Triage Notes (Signed)
Per [facility]: Pt to ED from [facility] called out for reports of blood seen when trach suctioned and blood in urinary catheter, as well as low blood pressure. Hx Guillian Barre Syndrome, ARF, CHF. Patient's baseline unknown. Patient opens eyes to voice, answers some questions with head nod or shake, does not follow commands. Patient appears malnourished. Patient has urinary catheter - tubing has dried blood and debris caked around outside of tubing. This was noticed around 10am this morning, EMS called out at 2:55pm. EMS VS: 82/48, P 90 regular, 96% on 6L 40% venturi. Patient denies pain, coughs on his own, but appears lethargic.

## 2017-01-10 NOTE — ED Notes (Signed)
MD at bedside. 

## 2017-01-11 ENCOUNTER — Inpatient Hospital Stay (HOSPITAL_COMMUNITY): Payer: PPO

## 2017-01-11 DIAGNOSIS — J9611 Chronic respiratory failure with hypoxia: Secondary | ICD-10-CM

## 2017-01-11 DIAGNOSIS — G9340 Encephalopathy, unspecified: Secondary | ICD-10-CM

## 2017-01-11 DIAGNOSIS — Z515 Encounter for palliative care: Secondary | ICD-10-CM

## 2017-01-11 DIAGNOSIS — Z7189 Other specified counseling: Secondary | ICD-10-CM

## 2017-01-11 DIAGNOSIS — E119 Type 2 diabetes mellitus without complications: Secondary | ICD-10-CM

## 2017-01-11 DIAGNOSIS — I5030 Unspecified diastolic (congestive) heart failure: Secondary | ICD-10-CM

## 2017-01-11 DIAGNOSIS — I9589 Other hypotension: Secondary | ICD-10-CM

## 2017-01-11 DIAGNOSIS — L89890 Pressure ulcer of other site, unstageable: Secondary | ICD-10-CM

## 2017-01-11 DIAGNOSIS — E43 Unspecified severe protein-calorie malnutrition: Secondary | ICD-10-CM

## 2017-01-11 DIAGNOSIS — N179 Acute kidney failure, unspecified: Secondary | ICD-10-CM

## 2017-01-11 DIAGNOSIS — I5032 Chronic diastolic (congestive) heart failure: Secondary | ICD-10-CM

## 2017-01-11 LAB — HIV ANTIBODY (ROUTINE TESTING W REFLEX): HIV Screen 4th Generation wRfx: NONREACTIVE

## 2017-01-11 LAB — CBC
HCT: 24.5 % — ABNORMAL LOW (ref 39.0–52.0)
HCT: 26.1 % — ABNORMAL LOW (ref 39.0–52.0)
HCT: 27.2 % — ABNORMAL LOW (ref 39.0–52.0)
HEMOGLOBIN: 7.8 g/dL — AB (ref 13.0–17.0)
HEMOGLOBIN: 8.2 g/dL — AB (ref 13.0–17.0)
HEMOGLOBIN: 8.6 g/dL — AB (ref 13.0–17.0)
MCH: 27.3 pg (ref 26.0–34.0)
MCH: 27.6 pg (ref 26.0–34.0)
MCH: 27.8 pg (ref 26.0–34.0)
MCHC: 31.4 g/dL (ref 30.0–36.0)
MCHC: 31.6 g/dL (ref 30.0–36.0)
MCHC: 31.8 g/dL (ref 30.0–36.0)
MCV: 87.2 fL (ref 78.0–100.0)
MCV: 87.2 fL (ref 78.0–100.0)
MCV: 87 fL (ref 78.0–100.0)
PLATELETS: 235 10*3/uL (ref 150–400)
PLATELETS: 269 10*3/uL (ref 150–400)
Platelets: 261 10*3/uL (ref 150–400)
RBC: 2.81 MIL/uL — ABNORMAL LOW (ref 4.22–5.81)
RBC: 3 MIL/uL — AB (ref 4.22–5.81)
RBC: 3.12 MIL/uL — AB (ref 4.22–5.81)
RDW: 18 % — ABNORMAL HIGH (ref 11.5–15.5)
RDW: 17.7 % — ABNORMAL HIGH (ref 11.5–15.5)
RDW: 17.8 % — AB (ref 11.5–15.5)
WBC: 10.8 10*3/uL — AB (ref 4.0–10.5)
WBC: 7.3 10*3/uL (ref 4.0–10.5)
WBC: 7.4 10*3/uL (ref 4.0–10.5)

## 2017-01-11 LAB — ECHOCARDIOGRAM COMPLETE
CHL CUP MV DEC (S): 11
CHL CUP TV REG PEAK VELOCITY: 283 cm/s
E decel time: 11 msec
FS: 38 % (ref 28–44)
IVS/LV PW RATIO, ED: 1.44
LA ID, A-P, ES: 35 mm
LA diam index: 1.9 cm/m2
LEFT ATRIUM END SYS DIAM: 35 mm
LV PW d: 9 mm — AB (ref 0.6–1.1)
LV TDI E'LATERAL: 12.8
LV e' LATERAL: 12.8 cm/s
LVOT area: 2.84 cm2
LVOT diameter: 19 mm
RV LATERAL S' VELOCITY: 11.5 cm/s
RV TAPSE: 30.9 mm
TDI e' medial: 9.36
TR max vel: 283 cm/s
Weight: 2342.4 oz

## 2017-01-11 LAB — IRON AND TIBC
IRON: 16 ug/dL — AB (ref 45–182)
Iron: 41 ug/dL — ABNORMAL LOW (ref 45–182)
Saturation Ratios: 10 % — ABNORMAL LOW (ref 17.9–39.5)
Saturation Ratios: 22 % (ref 17.9–39.5)
TIBC: 155 ug/dL — ABNORMAL LOW (ref 250–450)
TIBC: 185 ug/dL — AB (ref 250–450)
UIBC: 139 ug/dL
UIBC: 144 ug/dL

## 2017-01-11 LAB — COMPREHENSIVE METABOLIC PANEL
ALK PHOS: 69 U/L (ref 38–126)
ALT: 20 U/L (ref 17–63)
AST: 23 U/L (ref 15–41)
Albumin: 2.1 g/dL — ABNORMAL LOW (ref 3.5–5.0)
Anion gap: 7 (ref 5–15)
BUN: 53 mg/dL — ABNORMAL HIGH (ref 6–20)
CO2: 24 mmol/L (ref 22–32)
CREATININE: 0.76 mg/dL (ref 0.61–1.24)
Calcium: 8.1 mg/dL — ABNORMAL LOW (ref 8.9–10.3)
Chloride: 108 mmol/L (ref 101–111)
GFR calc Af Amer: >60 mL/min (ref >60)
GFR calc non Af Amer: >60 mL/min (ref >60)
Glucose, Bld: 94 mg/dL (ref 65–99)
Potassium: 3.8 mmol/L (ref 3.5–5.1)
SODIUM: 139 mmol/L (ref 135–145)
Total Bilirubin: 0.4 mg/dL (ref 0.3–1.2)
Total Protein: 5.7 g/dL — ABNORMAL LOW (ref 6.5–8.1)

## 2017-01-11 LAB — TROPONIN I

## 2017-01-11 LAB — VITAMIN B12
VITAMIN B 12: 1356 pg/mL — AB (ref 180–914)
Vitamin B-12: 881 pg/mL (ref 180–914)

## 2017-01-11 LAB — STREP PNEUMONIAE URINARY ANTIGEN: STREP PNEUMO URINARY ANTIGEN: NEGATIVE

## 2017-01-11 LAB — GLUCOSE, CAPILLARY
Glucose-Capillary: 76 mg/dL (ref 65–99)
Glucose-Capillary: 95 mg/dL (ref 65–99)

## 2017-01-11 LAB — CORTISOL: CORTISOL PLASMA: 16.1 ug/dL

## 2017-01-11 LAB — FERRITIN
FERRITIN: 220 ng/mL (ref 24–336)
FERRITIN: 282 ng/mL (ref 24–336)

## 2017-01-11 LAB — RETICULOCYTES
RBC.: 3.38 MIL/uL — AB (ref 4.22–5.81)
RETIC CT PCT: 0.6 % (ref 0.4–3.1)
Retic Count, Absolute: 20.3 10*3/uL (ref 19.0–186.0)

## 2017-01-11 LAB — HEMOGLOBIN A1C
Hgb A1c MFr Bld: 5.9 % — ABNORMAL HIGH (ref 4.8–5.6)
MEAN PLASMA GLUCOSE: 122.63 mg/dL

## 2017-01-11 LAB — FOLATE: Folate: 27 ng/mL (ref >5.9)

## 2017-01-11 MED ORDER — INSULIN ASPART 100 UNIT/ML ~~LOC~~ SOLN
0.0000 [IU] | SUBCUTANEOUS | Status: DC
  Administered 2017-01-12 (×3): 1 [IU] via SUBCUTANEOUS

## 2017-01-11 MED ORDER — ZINC OXIDE 20 % EX OINT
TOPICAL_OINTMENT | Freq: Two times a day (BID) | CUTANEOUS | Status: DC
  Administered 2017-01-12 – 2017-01-14 (×5): via TOPICAL
  Filled 2017-01-11: qty 28.35

## 2017-01-11 MED ORDER — ALBUMIN HUMAN 25 % IV SOLN
50.0000 g | Freq: Once | INTRAVENOUS | Status: DC
  Filled 2017-01-11: qty 200

## 2017-01-11 MED ORDER — IPRATROPIUM-ALBUTEROL 0.5-2.5 (3) MG/3ML IN SOLN
3.0000 mL | Freq: Four times a day (QID) | RESPIRATORY_TRACT | Status: DC
  Administered 2017-01-11 – 2017-01-12 (×3): 3 mL via RESPIRATORY_TRACT
  Filled 2017-01-11 (×3): qty 3

## 2017-01-11 MED ORDER — VANCOMYCIN HCL IN DEXTROSE 750-5 MG/150ML-% IV SOLN
750.0000 mg | Freq: Two times a day (BID) | INTRAVENOUS | Status: DC
  Administered 2017-01-11 – 2017-01-13 (×5): 750 mg via INTRAVENOUS
  Filled 2017-01-11 (×9): qty 150

## 2017-01-11 MED ORDER — METHYLPREDNISOLONE SODIUM SUCC 125 MG IJ SOLR
60.0000 mg | INTRAMUSCULAR | Status: DC
  Administered 2017-01-11: 60 mg via INTRAVENOUS
  Filled 2017-01-11: qty 2

## 2017-01-11 MED ORDER — SODIUM CHLORIDE 0.9 % IV BOLUS (SEPSIS)
500.0000 mL | Freq: Once | INTRAVENOUS | Status: AC
  Administered 2017-01-11: 500 mL via INTRAVENOUS

## 2017-01-11 MED ORDER — GUAIFENESIN-DM 100-10 MG/5ML PO SYRP
5.0000 mL | ORAL_SOLUTION | Freq: Three times a day (TID) | ORAL | Status: DC
  Filled 2017-01-11: qty 5

## 2017-01-11 NOTE — ED Notes (Signed)
Pt having occasional PVC's-- admitting dr paged.

## 2017-01-11 NOTE — Progress Notes (Signed)
Transport venti O2 setup for patient.

## 2017-01-11 NOTE — ED Notes (Signed)
Moved patient from stretcher to hospital bed

## 2017-01-11 NOTE — Progress Notes (Signed)
Sputum induction performed on patient through trachel aspirate and sent to main lab.

## 2017-01-11 NOTE — Progress Notes (Signed)
PROGRESS NOTE    Marc Rose  QQP:[id number] DOB: 1951/01/09 DOA: 01/10/2017 PCP: Patient, [name]   Brief Narrative:  66 y.o. 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     [facility] Fever and acute hypoxic resp failure. On arrival to the ER he was in shock with BP 50/30 and minimally responsive only withdrawing to pain. He had foul smelling urine, a large unstageable ulcer on his foot and pulmonary infiltrates on CXR.Admitted by pulmonary critical care. 8/15: bronchoscopy- Obstruction RLL, RML, BI, lavaged(pus)-  brushing performed  8/15- care transferred to hospitalist service Tracheal aspirate grew Pseudomonas. Pt was discharge on 8/22 on levaquin/ doxycycline.     Pt today apparently had hypotension, and blood in foley catheter.   CXR => 9/10 at [facility]=> increasing diffuse interstial opacities may represent increasing pneumonitis or pneumonia.     CXR 9/10=> [facility] IMPRESSION: Improving airspace opacity throughout the right lung with residual right upper lobe scarring. New patchy opacity in the left mid lung, question pneumonia.           Subjective: 9/11 alert, nods yes and no to questions, follows commands. Indicates negative CP, negative SOB, negative N/V, negative abdominal pain.    Assessment & Plan:   Active Problems:   Hypotension   Hyperglycemia   Miller-Fisher variant Guillain-Barre syndrome (HCC)   HCAP (healthcare-associated pneumonia)   Normochromic normocytic anemia   Leukocytosis   HCAP  [place]? --Not fully convinced patient has pneumonia. Lactic acid negative, negative fever,  negative leukocytosis. PCXR patchy opacification in lingula -Given patient's debilitated state continue antibiotics until tracheal aspirate cultures return. -Patient spikes fever or leukocytosis would double cover for Pseudomonas given patient's previous/recent pneumonia. -DuoNeb QID -Solu-Medrol 60 mg daily -Robitussin-DM TID    Chronic Hypoxic respiratory failure -Possible developing lingula pneumonia currently on trach collar at FiO2 40% which is baseline -Frequent suctioning  -Chronic vent since GBS 06/2016: PRN    Anemia(baseline Hgb 8-10)  Recent Labs Lab 01/10/17 1559 01/11/17 0048 01/11/17 1010 01/11/17 1839  HGB 7.3* 7.8* 8.2* 8.6*  -Baseline -Anemia panel pending -Occult blood pending    Acute vs Chronic Encephalopathy -Patient appears at baseline, follows all commands nods head yes and no questions    Chronic diastolic CHF -Strict in and out since admission + 12.5 L -Daily weight -Transfuse for hemoglobin<8   Pulmonary hypertension -See CHF    Chronic Hypotension -Continue home fludrocortisone -Albumin 50 g 1 -MAP goal> 65 -Normal saline 75 ml/hr   Acute renal failure  -Resolved from previous admission   Nutrition/Severe Protein Calorie malnutrition -PEG tube dependent -Continue pro-stat 30 ml per day -Nutrition consult -Free water 205mBID   Unstageable LEFT Foot Ulcer  -Continue  to space boots -Wound care consult -Consider MRI   DM type II controlled without, complication -2/7/12emoglobin A1c = 5.7  -Sensitive SSI   Hypothyroidism -Synthroid 75 g daily   Hypokalemia -Potassium goal> 4   Hypomagnesemia -Wheezing though> 2    Goals of care -9/11 PALLIATIVE CARE: Patient just discharged on 8/22 to [facility] discuss DO NOT RESUSCITATE, hospice with patient       DVT prophylaxis:  Lovenox Code Status: Full Family Communication: None Disposition Plan: TBD. Awaiting PALLIATIVE CARE recommendations   Consultants:   None   Procedures/Significant Events:  8/15 BAL (previous admission):-Obstruction RLL, RML, BI, lavaged clean moderate -BAL RLL, yellow pus.- Left l;ung wnl 8/22 discharged to [facility]     I have personally reviewed and interpreted all radiology studies and my findings are as above.  VENTILATOR SETTINGS: Trach collar -FiO2: 40% -SPO2: 93%    Cultures FROM PREVIOUS ADMISSION 8/12 Tracheal Aspirate positive pseudomonas aeruginosa 8/12 Blood cultures: Negative final  8/12 Urine culture Negative final 8/12 strep pneumo/Legionella urine antigen negative 8/15 BAL: Positive multiple organisms none predominant 8/20 blood 1/2 Positive GPC   9/10 blood pending 9/10 urine pending 9/10 tracheal aspirate pending 9/11 abdominal discharge PEG pending      Antimicrobials: Anti-infectives    Start     Stop   01/11/17 0600  vancomycin (VANCOCIN) IVPB 750 mg/150 ml premix     01/19/17 0559   01/10/17 2200  aztreonam (AZACTAM) 2 g in dextrose 5 % 50 mL IVPB         01/10/17 1845  vancomycin (VANCOCIN) IVPB 1000 mg/200 mL premix     01/10/17 1958       Devices    LINES / TUBES:  Trach>>    Continuous Infusions: . sodium chloride 1,000 mL (01/10/17 2043)  . sodium chloride    . sodium chloride 75 mL/hr at 01/10/17 2355  . aztreonam Stopped (01/10/17 2113)  . vancomycin 750 mg (01/11/17 0636)     Objective: Vitals:   01/11/17 0400 01/11/17 0415 01/11/17 0430 01/11/17 0445  BP: (!) 85/53 (!) 75/51 (!) 85/55 (!) 88/56  Pulse: 74 74 73 63  Resp: _0 Temp:      TempSrc:      SpO2: 100% 100% 100% 100%  Weight:        Intake/Output Summary (Last 24 hours) at 01/11/17 0823 Last data filed at 01/11/17 6720  Gross per 24 hour  Intake             3450 ml  Output             1200 ml  Net             2250 ml   Filed Weights   01/10/17 2318  Weight: 146 lb 6.4 oz (66.4 kg)    Examination:  General: Alert, nods yes and no questions, follows  commands positive chronic respiratory distress, cachectic Neck:  Negative scars, masses, torticollis, lymphadenopathy, JVD, #6 cuffed trach in place covered and clean, thick milky tracheal discharge Lungs: coarse breath sounds diffusely, negative wheezes or crackles Cardiovascular: Regular rate and rhythm without murmur gallop or rub normal S1 and S2 Abdomen: negative abdominal pain, nondistended, positive soft, bowel sounds, no rebound, no ascites, no appreciable mass, PEG tube present : Green discharge around PEG site  Extremities: No significant cyanosis, clubbing, bilateral lower extremity atrophy, left foot with unstageable plantar lesion (thick scab, chronic)  Psychiatric:  Unable to assess Central nervous system:  Patient moves all extremities to command, nods yes and no to questions  negative receptive aphasia.  .     Data Reviewed: Care during the described time interval was provided by me .  I have reviewed this patient's available data, including medical history, events of note, physical examination, and all test results as part of my evaluation.   CBC:  Recent Labs Lab 01/10/17 1559 01/11/17 0048  WBC 13.8* 10.8*  NEUTROABS 12.0*  --   HGB 7.3* 7.8*  HCT 22.6* 24.5*  MCV 86.6 87.2  PLT 290 329   Basic Metabolic Panel:  Recent Labs Lab 01/10/17 1559 01/11/17 0048  NA 138 139  K 3.8 3.8  CL 101 108  CO2 29 24  GLUCOSE 132* 94  BUN 65* 53*  CREATININE 1.05 0.76  CALCIUM 8.6* 8.1*   GFR: Estimated Creatinine Clearance: 85.3 mL/min (by C-G formula based on SCr of 0.76 mg/dL). Liver Function Tests:  Recent Labs Lab 01/10/17 1559 01/11/17 0048  AST 24 23  ALT 19 20  ALKPHOS 79 69  BILITOT 0.3 0.4  PROT 6.1* 5.7*  ALBUMIN 2.2* 2.1*   No results for input(s): LIPASE, AMYLASE in the last 168 hours. No results for input(s): AMMONIA in the last 168 hours. Coagulation Profile:  Recent Labs Lab 01/10/17 1559  INR 1.33   Cardiac Enzymes:  Recent  Labs Lab 01/11/17 0048  TROPONINI <0.03   BNP (last 3 results) No results for input(s): PROBNP in the last 8760 hours. HbA1C:  Recent Labs  01/11/17 0048  HGBA1C 5.9*   CBG: No results for input(s): GLUCAP in the last 168 hours. Lipid Profile: No results for input(s): CHOL, HDL, LDLCALC, TRIG, CHOLHDL, LDLDIRECT in the last 72 hours. Thyroid Function Tests: No results for input(s): TSH, T4TOTAL, FREET4, T3FREE, THYROIDAB in the last 72 hours. Anemia Panel:  Recent Labs  01/11/17 0048  VITAMINB12 881  FERRITIN 220  TIBC 155*  IRON 16*   Urine analysis:    Component Value Date/Time   COLORURINE PINK (A) 01/10/2017 1725   APPEARANCEUR CLOUDY (A) 01/10/2017 1725   LABSPEC 1.017 01/10/2017 1725   PHURINE 7.0 01/10/2017 1725   GLUCOSEU NEGATIVE 01/10/2017 1725   HGBUR LARGE (A) 01/10/2017 1725   BILIRUBINUR NEGATIVE 01/10/2017 1725   KETONESUR NEGATIVE 01/10/2017 1725   PROTEINUR 100 (A) 01/10/2017 1725   UROBILINOGEN 1.0 09/16/2013 2001   NITRITE NEGATIVE 01/10/2017 1725   LEUKOCYTESUR LARGE (A) 01/10/2017 1725   Sepsis Labs: _0 (procalcitonin:4,lacticidven:4)  ) Recent Results (from the past 240 hour(s))  Culture, respiratory (NON-Expectorated)     Status: None (Preliminary result)   Collection Time: 01/10/17 11:44 PM  Result Value Ref Range Status   Specimen Description TRACHEAL ASPIRATE  Final   Special Requests NONE  Final   Gram Stain   Final    ABUNDANT WBC PRESENT, PREDOMINANTLY PMN FEW SQUAMOUS EPITHELIAL CELLS PRESENT FEW GRAM POSITIVE RODS RARE GRAM POSITIVE COCCI RARE GRAM NEGATIVE RODS RARE YEAST    Culture PENDING  Incomplete   Report Status PENDING  Incomplete         Radiology Studies: Dg Chest Portable 1 View  Result Date: 01/10/2017 CLINICAL DATA:  Rule out infection EXAM: PORTABLE CHEST 1 VIEW COMPARISON:  12/20/2016 FINDINGS: Tracheostomy remains in place, unchanged. Previously seen patchy airspace opacities throughout  the right lung have improved with mild residual opacity in the right upper lobe, favor scarring. New patchy airspace disease in the left upper lobe in the left mid lung, cannot exclude pneumonia. Heart is normal size. No effusions. IMPRESSION: Improving airspace opacity throughout the right lung with residual right upper lobe scarring. New patchy opacity in the left mid lung, question pneumonia. Electronically Signed   By: Rolm Baptise M.D.   On: 01/10/2017 18:20        Scheduled Meds: . aspirin  81 mg Per Tube Daily  . chlorhexidine  15 mL Mouth/Throat BID  .  clonazePAM  0.5 mg Per Tube Daily  . enoxaparin (LOVENOX) injection  40 mg Subcutaneous Q24H  . feeding supplement (PRO-STAT SUGAR FREE 64)  30 mL Per Tube Daily  . fludrocortisone  0.2 mg Per Tube Daily  . free water  200 mL Per Tube BID  . gabapentin  300 mg Per Tube TID  . levothyroxine  75 mcg Per Tube QAC breakfast  . Menthol-Zinc Oxide   Apply externally BID  . metoCLOPramide  5 mg Intravenous Q8H  . multivitamin with minerals  1 tablet Per Tube Daily  . pantoprazole  40 mg Intravenous BID  . polyvinyl alcohol  1 drop Both Eyes TID  . QUEtiapine  50 mg Per Tube BID  . scopolamine  1 patch Transdermal Q72H  . sertraline  50 mg Per Tube Daily  . simethicone  160 mg Per Tube q12n4p  . sodium chloride flush  3 mL Intravenous Q12H  . sucralfate  1 g Per Tube BID  . thiamine  100 mg Per Tube Daily  . vitamin A & D  1 application Topical Daily  . vitamin C  500 mg Per Tube BID  . zinc sulfate  220 mg Per Tube Daily   Continuous Infusions: . sodium chloride 1,000 mL (01/10/17 2043)  . sodium chloride    . sodium chloride 75 mL/hr at 01/10/17 2355  . aztreonam Stopped (01/10/17 2113)  . vancomycin 750 mg (01/11/17 0636)     LOS: 1 day    Time spent: 40 minutes    Jayleen Afonso, Geraldo Docker, MD [facility] Pager [phone]   If 7PM-7AM, please contact night-coverage [email or web address] Password [email or web address] 01/11/2017, 8:23 AM

## 2017-01-11 NOTE — ED Notes (Signed)
Report given to [facility]

## 2017-01-11 NOTE — Progress Notes (Signed)
Daily Progress Note   Patient Name: Marc Rose       Date: 01/11/2017 DOB: 1950-09-30  Age: 66 y.o. MRN#: [id number] Attending Physician: Allie Bossier, MD Primary Care Physician: Patient, [name] Admit Date: 01/10/2017  Reason for Consultation/Follow-up: Establishing goals of care and Psychosocial/spiritual support  Subjective: Mr. Marc Rose is lying quietly on the stretcher.  Mr. Marc Rose will open eyes to request, briefly makes, but does not keep eye contact. He will answer questions with delayed response, mouthing answers.  He is difficult to understand and only has simple responses. He tells me that we are in "my room".  There is no family at bedside at this time.    Call to sister/NOK/ [facility] surrogate, Chauncy Lean.  Left generic VM message on her home phone.  Call to cell phone, no answer, unable to leave VM.    Call to hospitalist, Dr. Sherral Hammers. Case discussed. PMT will continue to reach out to sister, Chauncy Lean in [place].  Length of Stay: 1  Current Medications: Scheduled Meds:  . aspirin  81 mg Per Tube Daily  . chlorhexidine  15 mL Mouth/Throat BID  . clonazePAM  0.5 mg Per Tube Daily  . enoxaparin (LOVENOX) injection  40 mg Subcutaneous Q24H  . feeding supplement (PRO-STAT SUGAR FREE 64)  30 mL Per Tube Daily  . fludrocortisone  0.2 mg Per Tube Daily  . free water  200 mL Per Tube BID  . gabapentin  300 mg Per Tube TID  . levothyroxine  75 mcg Per Tube QAC breakfast  . metoCLOPramide  5 mg Intravenous Q8H  . multivitamin with minerals  1 tablet Per Tube Daily  . pantoprazole  40 mg Intravenous BID  . polyvinyl alcohol  1 drop Both Eyes TID  . QUEtiapine  50 mg Per Tube BID  . scopolamine  1 patch Transdermal Q72H  . sertraline  50 mg Per Tube Daily  . simethicone   160 mg Per Tube q12n4p  . sodium chloride flush  3 mL Intravenous Q12H  . sucralfate  1 g Per Tube BID  . thiamine  100 mg Per Tube Daily  . vitamin A & D  1 application Topical Daily  . vitamin C  500 mg Per Tube BID  . zinc oxide   Topical BID  .  zinc sulfate  220 mg Per Tube Daily    Continuous Infusions: . sodium chloride 1,000 mL (01/10/17 2043)  . sodium chloride    . sodium chloride 75 mL/hr at 01/10/17 2355  . albumin human    . aztreonam Stopped (01/11/17 1051)  . vancomycin Stopped (01/11/17 0800)    PRN Meds: sodium chloride, albuterol, bisacodyl, diphenhydrAMINE, fluticasone, lidocaine, LORazepam, mineral oil, ondansetron, polyethylene glycol, senna-docusate, sodium chloride flush  Physical Exam  Constitutional: No distress.  Appears frail, acutely/chronically ill. Briefly makes eye contact, sleepy  HENT:  Temporal wasting  Cardiovascular: Normal rate and regular rhythm.   Pulmonary/Chest: No respiratory distress.  Trach collar, humidified   Abdominal: Soft. He exhibits no distension.  PEG tube site  Musculoskeletal: He exhibits no edema.  Neurological:  Lethargic, briefly makes but does not keep eye contact, Mouths answers  Skin: Skin is warm and dry.  Nursing note and vitals reviewed.           Vital Signs: BP 91/61   Pulse 76   Temp 98.6 F (37 C) (Axillary)   Resp 12   Wt 66.4 kg (146 lb 6.4 oz)   SpO2 99%   BMI 18.80 kg/m  SpO2: SpO2: 99 % O2 Device: O2 Device: Tracheostomy Collar O2 Flow Rate: O2 Flow Rate (L/min): 8 L/min  Intake/output summary:  Intake/Output Summary (Last 24 hours) at 01/11/17 1448 Last data filed at 01/11/17 0800  Gross per 24 hour  Intake             3600 ml  Output             1200 ml  Net             2400 ml   LBM:   Baseline Weight: Weight: 66.4 kg (146 lb 6.4 oz) Most recent weight: Weight: 66.4 kg (146 lb 6.4 oz)       Palliative Assessment/Data:      Patient Active Problem List   Diagnosis Date Noted   . Leukocytosis 01/10/2017  . Palliative care by specialist   . Tracheostomy status ([place])   . Acute on chronic respiratory failure with hypoxemia ([place])   . Protein-calorie malnutrition, severe 12/14/2016  . HCAP (healthcare-associated pneumonia)   . Sepsis due to undetermined organism ([place])   . Normochromic normocytic anemia   . Septic shock ([place]) 12/12/2016  . DNR (do not resuscitate) discussion   . Urinary tract infection without hematuria   . Pressure injury of skin 06/21/2016  . Palliative care encounter   . Goals of care, counseling/discussion   . Miller-Fisher variant Guillain-Barre syndrome ([place]) 06/10/2016  . Acute respiratory failure ([place]) 06/10/2016  . Malnutrition of moderate degree 06/10/2016  . Ataxic gait   . Muscle weakness (generalized)   . 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 Acute kidney injury (nontraumatic) ([place]) 06/04/2016  . Drug rash 11/12/2015  . Hyperpigmentation 05/15/2015  . Abdominal pain, right lower quadrant 02/05/2015  . Abscess, psoas ([place]) 02/05/2015  . Acute upper respiratory infection 02/05/2015  . Long term current use of antibiotics 02/05/2015  . Benign essential HTN 02/05/2015  . Edema leg 02/05/2015  . Complex regional pain syndrome type 2 of upper extremity 02/05/2015  . Back pain, chronic 02/05/2015  . CN (constipation) 02/05/2015  . CD (contact dermatitis) 02/05/2015  . Hyperglycemia 02/05/2015  . LBP (low back pain) 02/05/2015  . Discitis of lumbar region 02/05/2015  . Disorder of nutrition 02/05/2015  . Feeling  bilious 02/05/2015  . Flu vaccine need 02/05/2015  . Peripheral neuropathic pain 02/05/2015  . Non compliance with medical treatment 02/05/2015  . Abnormal blood chemistry 02/05/2015  . Angiopathy, peripheral ([place]) 02/05/2015  . Nerve disease, peripheral 02/05/2015  . Hypercholesterolemia without hypertriglyceridemia 02/05/2015  . Spinal stenosis 02/05/2015  . Buzzing in ear 02/05/2015   . Compulsive tobacco user syndrome 02/05/2015  . Bladder retention 02/05/2015  . Abnormal weight loss 02/05/2015  . Diskitis 02/05/2015  . Hardware complicating wound infection ([place]) 02/05/2015  . Hyperlipidemia 07/05/2014  . Peripheral arterial disease ([place]) 07/05/2014  . Hypotension 09/16/2013  . HTN (hypertension) 09/16/2013  . Anxiety 09/16/2013  . Back pain 09/16/2013    Palliative Care Assessment & Plan   Patient Profile: Mr. 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 Continue to reach out to sister/NOK/HC surrogate for code status discussions.   Goals of Care and Additional Recommendations:  Limitations on Scope of Treatment: Full Scope Treatment  Code Status:    Code Status Orders        Start     Ordered   01/10/17 2300  Full code  Continuous     01/10/17 2300    Code Status History    Date Active Date Inactive Code Status Order ID Comments User Context   12/12/2016  3:56 AM 12/22/2016  7:26 PM Full Code [id number]  Reyne Dumas, MD ED   06/03/2016  3:17 PM 06/25/2016  9:21 PM Full Code [id number]  Tawni Millers, MD Inpatient   09/16/2013 10:03 PM 09/19/2013  4:23 PM Full Code [id number]  Doree Albee, MD ED       Prognosis:  < 3 months, would not be surprising based on functional status, chronic illness.  Discharge Planning:  likely return to [facility] for LTC.   Care plan  was discussed with nursing staff, and Dr. Sherral Hammers.  Thank you for allowing the Palliative Medicine Team to assist in the care of this patient.   Time In: 1245 Time Out: 1320 Total Time 35 minutes Prolonged Time Billed  no       Greater than 50%  of this time was spent counseling and coordinating care related to the above assessment and plan.  Drue Novel, NP  Please contact Palliative Medicine Team phone at [phone] for questions and concerns.

## 2017-01-11 NOTE — Progress Notes (Signed)
  Echocardiogram 2D Echocardiogram has been performed.  Marc Rose 01/11/2017, 5:48 PM

## 2017-01-12 ENCOUNTER — Inpatient Hospital Stay (HOSPITAL_COMMUNITY): Payer: PPO

## 2017-01-12 DIAGNOSIS — I959 Hypotension, unspecified: Secondary | ICD-10-CM

## 2017-01-12 DIAGNOSIS — G610 Guillain-Barre syndrome: Secondary | ICD-10-CM

## 2017-01-12 DIAGNOSIS — J189 Pneumonia, unspecified organism: Secondary | ICD-10-CM

## 2017-01-12 LAB — GLUCOSE, CAPILLARY
GLUCOSE-CAPILLARY: 127 mg/dL — AB (ref 65–99)
Glucose-Capillary: 125 mg/dL — ABNORMAL HIGH (ref 65–99)
Glucose-Capillary: 135 mg/dL — ABNORMAL HIGH (ref 65–99)
Glucose-Capillary: 126 mg/dL — ABNORMAL HIGH (ref 65–99)

## 2017-01-12 LAB — FOLATE RBC
FOLATE, HEMOLYSATE: 430.8 ng/mL
Folate, RBC: 1491 ng/mL (ref >498)
Hematocrit: 28.9 % — ABNORMAL LOW (ref 37.5–51.0)

## 2017-01-12 MED ORDER — SENNOSIDES 8.8 MG/5ML PO SYRP
5.0000 mL | ORAL_SOLUTION | Freq: Every day | ORAL | Status: DC | PRN
  Filled 2017-01-12: qty 5

## 2017-01-12 MED ORDER — DIPHENHYDRAMINE HCL 12.5 MG/5ML PO ELIX
25.0000 mg | ORAL_SOLUTION | Freq: Four times a day (QID) | ORAL | Status: DC | PRN
  Filled 2017-01-12: qty 10

## 2017-01-12 MED ORDER — ENOXAPARIN SODIUM 40 MG/0.4ML ~~LOC~~ SOLN
40.0000 mg | SUBCUTANEOUS | Status: DC
  Administered 2017-01-12 – 2017-01-13 (×2): 40 mg via SUBCUTANEOUS
  Filled 2017-01-12: qty 0.4

## 2017-01-12 MED ORDER — SODIUM CHLORIDE 0.9 % IV SOLN
1000.0000 mL | INTRAVENOUS | Status: DC
  Administered 2017-01-12 (×2): 1000 mL via INTRAVENOUS

## 2017-01-12 MED ORDER — LORAZEPAM 1 MG PO TABS
1.0000 mg | ORAL_TABLET | ORAL | Status: DC | PRN
  Administered 2017-01-13 (×2): 1 mg
  Filled 2017-01-12 (×2): qty 1

## 2017-01-12 MED ORDER — ALBUTEROL SULFATE (2.5 MG/3ML) 0.083% IN NEBU: 2.5000 mg | INHALATION_SOLUTION | RESPIRATORY_TRACT | Status: DC | PRN

## 2017-01-12 MED ORDER — JEVITY 1.2 CAL PO LIQD
1000.0000 mL | ORAL | Status: DC
  Administered 2017-01-12: 1000 mL
  Filled 2017-01-12 (×5): qty 1000

## 2017-01-12 MED ORDER — ADULT MULTIVITAMIN LIQUID CH
15.0000 mL | Freq: Every day | ORAL | Status: DC
  Administered 2017-01-12: 15 mL via ORAL
  Filled 2017-01-12 (×2): qty 15

## 2017-01-12 MED ORDER — ADULT MULTIVITAMIN LIQUID CH
15.0000 mL | Freq: Every day | ORAL | Status: DC
  Administered 2017-01-13: 15 mL
  Filled 2017-01-12: qty 15

## 2017-01-12 MED ORDER — GUAIFENESIN-DM 100-10 MG/5ML PO SYRP
5.0000 mL | ORAL_SOLUTION | Freq: Three times a day (TID) | ORAL | Status: DC
  Administered 2017-01-12 – 2017-01-14 (×5): 5 mL
  Filled 2017-01-12 (×5): qty 5

## 2017-01-12 MED ORDER — DOCUSATE SODIUM 50 MG/5ML PO LIQD: 50.0000 mg | Freq: Every day | ORAL | Status: DC | PRN

## 2017-01-12 MED ORDER — IPRATROPIUM-ALBUTEROL 0.5-2.5 (3) MG/3ML IN SOLN
3.0000 mL | Freq: Three times a day (TID) | RESPIRATORY_TRACT | Status: DC
  Administered 2017-01-12 – 2017-01-13 (×5): 3 mL via RESPIRATORY_TRACT
  Filled 2017-01-12 (×6): qty 3

## 2017-01-12 MED ORDER — METOCLOPRAMIDE HCL 5 MG/5ML PO SOLN
10.0000 mg | Freq: Three times a day (TID) | ORAL | Status: DC
  Administered 2017-01-12 – 2017-01-14 (×5): 10 mg
  Filled 2017-01-12 (×7): qty 10

## 2017-01-12 MED ORDER — PANTOPRAZOLE SODIUM 40 MG PO PACK
40.0000 mg | PACK | Freq: Two times a day (BID) | ORAL | Status: DC
  Administered 2017-01-12 – 2017-01-14 (×4): 40 mg
  Filled 2017-01-12 (×4): qty 20

## 2017-01-12 NOTE — Care Management Note (Signed)
Case Management Note Marvetta Gibbons RN, BSN Unit 4E-Case Manager [phone]  Patient Details  Name: Marc Rose MRN: [id number] Date of Birth: 1950/12/03  Subjective/Objective:   Pt  Admitted with hypotension and ?pna- pt with hx of Guillain-Barre syndrome, with trach and PEG.                  Action/Plan: PTA pt from [facility]- on last admission pt was denied [facility] by insurance and there was no peer 2 peer attempted by discharging MD. With readmission within 30 days may be worth looking at [facility] again with insurance??- will see how pt progresses.  Have spoken with [facility] liaison who will follow along.   Expected Discharge Date:                  Expected Discharge Plan:  Skilled Nursing Facility  In-House Referral:  Clinical Social Work  Discharge planning Services  CM Consult  Post Acute Care Choice:    Choice offered to:     DME Arranged:    DME Agency:     HH Arranged:    [place] Agency:     Status of Service:  In process, will continue to follow  If discussed at Long Length of Stay Meetings, dates discussed:    Discharge Disposition:   Additional Comments:  Dawayne Patricia, RN 01/12/2017, 11:21 AM

## 2017-01-12 NOTE — Progress Notes (Signed)
Initial Nutrition Assessment  DOCUMENTATION CODES:   Severe malnutrition in context of chronic illness  INTERVENTION:    Initiate Jevity 1.2 at 20 ml/hr and increase by 10 ml every 4 hours to goal rate of 60 ml/hr   Continue Prostat liquid protein 30 ml daily   Continue free water flushes at 200 ml BID  Total TF regimen to provide 1828 kcals, 95 gm protein, 1162 ml of free water daily  NUTRITION DIAGNOSIS:   Malnutrition (severe) related to chronic illness (Guillain Barre with trach) as evidenced by severe depletion of body fat, severe depletion of muscle mass  GOAL:   Patient will meet greater than or equal to 90% of their needs  MONITOR:   TF tolerance, Labs, Weight trends, Skin, I & O's  REASON FOR ASSESSMENT:   Consult Enteral/tube feeding initiation and management  ASSESSMENT:   66 y.o. 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   Pt currently on trach collar. Familiar to Clinical Nutrition from previous admissions. From [facility]. TF regimen via PEG tube PTA unknown.  Severe malnutrition identified and ongoing. Labs and medications reviewed. CBG's [phone].  Palliative Medicine Team note 9/11 reviewed.  Nutrition-Focused physical exam completed. Findings are severe fat depletion, severe muscle depletion, and no edema.   Diet Order:  Diet NPO time specified  Skin:  Wound (see comment) (unstageable R foot, Stage I sacrum)  Last BM:  N/A  Height:   Ht Readings from Last 1 Encounters:  12/12/16 6\' 2"  (1.88 m)   Weight:   Wt Readings from Last 1 Encounters:  01/12/17 145 lb 3.2 oz (65.9 kg)   Wt Readings from Last 10 Encounters:   01/12/17 145 lb 3.2 oz (65.9 kg)  12/22/16 136 lb 0.4 oz (61.7 kg)  06/25/16 149 lb 11.1 oz (67.9 kg)  05/17/16 165 lb (74.8 kg)  01/21/16 175 lb (79.4 kg)  11/12/15 178 lb (80.7 kg)  05/15/15 187 lb 8 oz (85 kg)  02/05/15 179 lb (81.2 kg)  07/05/14 169 lb 6.4 oz (76.8 kg)  09/18/13 141 lb 8.6 oz (64.2 kg)   Ideal Body Weight:  86.4 kg  BMI:  Body mass index is 18.64 kg/m.  Estimated Nutritional Needs:   Kcal:  1700-1900  Protein:  90-110 gm  Fluid:  1.7-1.9 L  EDUCATION NEEDS:   No education needs identified at this time  Arthur Holms, RD, LDN Pager #: [phone] After-Hours Pager #: [phone]

## 2017-01-12 NOTE — Progress Notes (Signed)
[place] TEAM 1 - Stepdown/ICU TEAM  JANET DECESARE  NTI:[id number] DOB: March 27, 1951 DOA: 01/10/2017 PCP: Patient, [name]    Brief Narrative:  66yo M with history of Guillain-Barre Syndrome (06/2016) for which he has a Tracheostomy and PEG and is on Chronic Mechanical ventilation living at [facility]. He also has a h/o Anxiety, OA, Chronic pain, DM, HTN, PAD, and Orthostatic hypotension. He was transferred from [facility] to the ER again on 9/10 w/ reports of hypotension and blood in his foley catheter.  A CXR at [place] reportedly noted "increasing diffuse interstitial opacities."  In the ED a CXR actually suggested overall improved aeration compared to prior CXRs, but w/ a possible new opacity in the L mid lung.    Significant Events: 8/12 admit to [facility] ICU 8/15 [facility] assumed care   Subjective: The pt is alert and interactive.  He denies sob, n/v, abdom pain, or chest pain.  He appears to be at his baseline.    Assessment & Plan:  ?Reccurrent PNA - L mid lung I doubt this pt has a new lung infection - he likely has recurrent aspiration pneumonitis - will D/C abx after 3 days of tx - stop steroids today - follow resp cx   Chronic Hypoxic respiratory failure Stable on trach collar - wean O2 as able - baseline support reportedly 35% FiO2  Proteus in Urine Cx W/ his hx will be difficult to distinguish colonizer from pathogen - w/ high colony count will cont abx for now   CHRONIC Hypotension / Orthostasis  On daily florinef - continue - BP stable   Anemia  baseline Hgb 8-10 - no evidence of acute blood loss   Recent Labs Lab 01/10/17 1559 01/11/17 0048 01/11/17 1010 01/11/17 1839  HGB 7.3* 7.8* 8.2* 8.6*   Severe malnutrition in context of chronic illness PEG tube dependent - continue TF  Unstageable Foot Ulcer  Continue dressing changes and pressure relief boots  DM2 CBG well controlled   Hypothyroidism Continue synthroid   DVT prophylaxis: lovenox    Code Status: FULL CODE Family Communication: no family present at time of exam  Disposition Plan: will monitor pt another 24hrs - unless new issues come up pt should be ready to return to [facility] on 9/13  Consultants:  Palliative Care   Antimicrobials:  Vancomycin 9/10 > Aztreonam 9/10 >   Objective: Blood pressure 115/81, pulse 82, temperature (!) 97.3 F (36.3 C), temperature source Axillary, resp. rate 15, weight 65.9 kg (145 lb 3.2 oz), SpO2 98 %.  Intake/Output Summary (Last 24 hours) at 01/12/17 1036 Last data filed at 01/11/17 1843  Gross per 24 hour  Intake           4612.5 ml  Output              500 ml  Net           4112.5 ml   Filed Weights   01/10/17 2318 01/12/17 0500  Weight: 66.4 kg (146 lb 6.4 oz) 65.9 kg (145 lb 3.2 oz)    Examination: General: No acute respiratory distress - alert  Lungs: Clear to auscultation bilaterally without wheezes or crackles - trach collar  Cardiovascular: Regular rate and rhythm without murmur  Abdomen: Nontender, nondistended, soft, bowel sounds positive, no rebound, no ascites, no appreciable mass Extremities: No significant edema bilateral lower extremities   CBC:  Recent Labs Lab 01/10/17 1559 01/11/17 0048 01/11/17 1010 01/11/17 1839  WBC 13.8* 10.8* 7.3  7.4  NEUTROABS 12.0*  --   --   --   HGB 7.3* 7.8* 8.2* 8.6*  HCT 22.6* 24.5* 26.1* 27.2*  MCV 86.6 87.2 87.0 87.2  PLT 290 261 235 409   Basic Metabolic Panel:  Recent Labs Lab 01/10/17 1559 01/11/17 0048  NA 138 139  K 3.8 3.8  CL 101 108  CO2 29 24  GLUCOSE 132* 94  BUN 65* 53*  CREATININE 1.05 0.76  CALCIUM 8.6* 8.1*   GFR: Estimated Creatinine Clearance: 84.7 mL/min (by C-G formula based on SCr of 0.76 mg/dL).  Liver Function Tests:  Recent Labs Lab 01/10/17 1559 01/11/17 0048  AST 24 23  ALT 19 20  ALKPHOS 79 69  BILITOT 0.3 0.4  PROT 6.1* 5.7*  ALBUMIN 2.2* 2.1*    HbA1C: Hgb A1c MFr Bld  Date/Time Value Ref Range Status   01/11/2017 12:48 AM 5.9 (H) 4.8 - 5.6 % Final    Comment:    (NOTE) Pre diabetes:          5.7%-6.4% Diabetes:              >6.4% Glycemic control for   <7.0% adults with diabetes   06/19/2016 05:00 AM 5.7 (H) 4.8 - 5.6 % Final    Comment:    (NOTE)         Pre-diabetes: 5.7 - 6.4         Diabetes: >6.4         Glycemic control for adults with diabetes: <7.0     CBG:  Recent Labs Lab 01/11/17 2138 01/12/17 0003 01/12/17 0551  GLUCAP 76 95 125*    Recent Results (from the past 240 hour(s))  Culture, blood (Routine x 2)     Status: None (Preliminary result)   Collection Time: 01/10/17  3:59 PM  Result Value Ref Range Status   Specimen Description BLOOD RIGHT FOREARM  Final   Special Requests   Final    BOTTLES DRAWN AEROBIC AND ANAEROBIC Blood Culture adequate volume   Culture NO GROWTH < 24 HOURS  Final   Report Status PENDING  Incomplete  Culture, blood (Routine x 2)     Status: None (Preliminary result)   Collection Time: 01/10/17  4:02 PM  Result Value Ref Range Status   Specimen Description BLOOD LEFT FOREARM  Final   Special Requests   Final    BOTTLES DRAWN AEROBIC AND ANAEROBIC Blood Culture adequate volume   Culture NO GROWTH < 24 HOURS  Final   Report Status PENDING  Incomplete  Urine culture     Status: Abnormal (Preliminary result)   Collection Time: 01/10/17  5:25 PM  Result Value Ref Range Status   Specimen Description URINE, CATHETERIZED  Final   Special Requests NONE  Final   Culture (A)  Final    >=100,000 COLONIES/mL PROTEUS MIRABILIS SUSCEPTIBILITIES TO FOLLOW    Report Status PENDING  Incomplete  Culture, respiratory (NON-Expectorated)     Status: None (Preliminary result)   Collection Time: 01/10/17 11:44 PM  Result Value Ref Range Status   Specimen Description TRACHEAL ASPIRATE  Final   Special Requests NONE  Final   Gram Stain   Final    ABUNDANT WBC PRESENT, PREDOMINANTLY PMN FEW SQUAMOUS EPITHELIAL CELLS PRESENT FEW GRAM POSITIVE  RODS RARE GRAM POSITIVE COCCI RARE GRAM NEGATIVE RODS RARE YEAST    Culture PENDING  Incomplete   Report Status PENDING  Incomplete  Aerobic Culture (superficial specimen)  Status: None (Preliminary result)   Collection Time: 01/11/17  4:42 PM  Result Value Ref Range Status   Specimen Description PEG SITE  Final   Special Requests Normal  Final   Gram Stain   Final    RARE WBC PRESENT,BOTH PMN AND MONONUCLEAR NO ORGANISMS SEEN    Culture TOO YOUNG TO READ  Final   Report Status PENDING  Incomplete     Scheduled Meds: . aspirin  81 mg Per Tube Daily  . chlorhexidine  15 mL Mouth/Throat BID  . clonazePAM  0.5 mg Per Tube Daily  . enoxaparin (LOVENOX) injection  40 mg Subcutaneous Q24H  . feeding supplement (PRO-STAT SUGAR FREE 64)  30 mL Per Tube Daily  . fludrocortisone  0.2 mg Per Tube Daily  . free water  200 mL Per Tube BID  . gabapentin  300 mg Per Tube TID  . guaiFENesin-dextromethorphan  5 mL Oral Q8H  . insulin aspart  0-9 Units Subcutaneous Q4H  . ipratropium-albuterol  3 mL Nebulization Q6H  . levothyroxine  75 mcg Per Tube QAC breakfast  . methylPREDNISolone (SOLU-MEDROL) injection  60 mg Intravenous Q24H  . metoCLOPramide  5 mg Intravenous Q8H  . multivitamin  15 mL Oral Daily  . pantoprazole  40 mg Intravenous BID  . polyvinyl alcohol  1 drop Both Eyes TID  . QUEtiapine  50 mg Per Tube BID  . scopolamine  1 patch Transdermal Q72H  . sertraline  50 mg Per Tube Daily  . simethicone  160 mg Per Tube q12n4p  . sodium chloride flush  3 mL Intravenous Q12H  . sucralfate  1 g Per Tube BID  . thiamine  100 mg Per Tube Daily  . vitamin A & D  1 application Topical Daily  . vitamin C  500 mg Per Tube BID  . zinc oxide   Topical BID  . zinc sulfate  220 mg Per Tube Daily     LOS: 2 days   Cherene Altes, MD [facility] Office  [phone] Pager - Text Page per Amion as per below:  On-Call/Text Page:      [email or web address]      password [email or web address]  If  7PM-7AM, please contact night-coverage [email or web address] Password [facility] 01/12/2017, 10:36 AM

## 2017-01-13 DIAGNOSIS — J690 Pneumonitis due to inhalation of food and vomit: Secondary | ICD-10-CM | POA: Diagnosis present

## 2017-01-13 DIAGNOSIS — Z7189 Other specified counseling: Secondary | ICD-10-CM

## 2017-01-13 LAB — GLUCOSE, CAPILLARY
GLUCOSE-CAPILLARY: 96 mg/dL (ref 65–99)
GLUCOSE-CAPILLARY: 107 mg/dL — AB (ref 65–99)
Glucose-Capillary: 101 mg/dL — ABNORMAL HIGH (ref 65–99)
Glucose-Capillary: 108 mg/dL — ABNORMAL HIGH (ref 65–99)
Glucose-Capillary: 83 mg/dL (ref 65–99)
Glucose-Capillary: 91 mg/dL (ref 65–99)

## 2017-01-13 LAB — CULTURE, RESPIRATORY: CULTURE: NORMAL

## 2017-01-13 LAB — BASIC METABOLIC PANEL
Anion gap: 5 (ref 5–15)
BUN: 27 mg/dL — ABNORMAL HIGH (ref 6–20)
CO2: 25 mmol/L (ref 22–32)
CREATININE: 0.52 mg/dL — AB (ref 0.61–1.24)
Calcium: 8.3 mg/dL — ABNORMAL LOW (ref 8.9–10.3)
Chloride: 109 mmol/L (ref 101–111)
GFR calc Af Amer: >60 mL/min (ref >60)
Glucose, Bld: 95 mg/dL (ref 65–99)
POTASSIUM: 3.3 mmol/L — AB (ref 3.5–5.1)
SODIUM: 139 mmol/L (ref 135–145)

## 2017-01-13 LAB — URINE CULTURE

## 2017-01-13 LAB — CBC
HEMATOCRIT: 27.3 % — AB (ref 39.0–52.0)
Hemoglobin: 8.6 g/dL — ABNORMAL LOW (ref 13.0–17.0)
MCH: 27.2 pg (ref 26.0–34.0)
MCHC: 31.5 g/dL (ref 30.0–36.0)
MCV: 86.4 fL (ref 78.0–100.0)
PLATELETS: 296 10*3/uL (ref 150–400)
RBC: 3.16 MIL/uL — ABNORMAL LOW (ref 4.22–5.81)
RDW: 17.3 % — AB (ref 11.5–15.5)
WBC: 4.8 10*3/uL (ref 4.0–10.5)

## 2017-01-13 LAB — LEGIONELLA PNEUMOPHILA SEROGP 1 UR AG: L. pneumophila Serogp 1 Ur Ag: NEGATIVE

## 2017-01-13 MED ORDER — SODIUM CHLORIDE 0.9 % IV SOLN
1.0000 g | Freq: Three times a day (TID) | INTRAVENOUS | Status: DC
  Administered 2017-01-13 (×2): 1 g via INTRAVENOUS
  Filled 2017-01-13 (×3): qty 1

## 2017-01-13 MED ORDER — MORPHINE SULFATE 10 MG/5ML PO SOLN: 2.5000 mg | ORAL | Status: DC | PRN

## 2017-01-13 MED ORDER — DEXTROSE-NACL 5-0.45 % IV SOLN
INTRAVENOUS | Status: DC
  Administered 2017-01-13 (×2): via INTRAVENOUS

## 2017-01-13 NOTE — Evaluation (Signed)
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 He also has a h/o Anxiety, OA, Chronic pain, DM, HTN, PAD, and Orthostatic hypotension    Clinical Impression  PT ordered for ROM and to see if we had more supportive chair to use to get pt OOB.  At this point nursing doesn't feel safe getting pt into standard recliner.  Pt reports he was getting OOB daily with lift at [facility].  Did some gentle ROM but no skilled needs identified today.  Pt at baseline physically.  He agrees he is happy to go back to [facility]    Follow Up Recommendations No PT follow up;LTACH    Equipment Recommendations  None recommended by PT    Recommendations for Other Services       Precautions / Restrictions Precautions Precautions: Fall Precaution Comments: Prevalon boots Restrictions Weight Bearing Restrictions: No      Mobility  Bed Mobility               General bed mobility comments: Requires total assist to roll right/left. Eyes remained closed with gross movements but open when interacting. No pt assist provided.   Transfers                    Ambulation/Gait                Stairs            Wheelchair Mobility    Modified Rankin (Stroke Patients Only)       Balance                                             Pertinent Vitals/Pain Pain Assessment:  (pt denied pain)    Home Living Family/patient expects to be discharged to:: Skilled nursing facility                 Additional Comments: From [facility] on mechanical ventilation at baseline    Prior Function           Comments: pt able to nod yes or no - mouthing words but difficult to understand (just  had ativan).  From the best i can tell - he was getting up daily to a chair via lift at [facility]     Hand Dominance   Dominant Hand: Left    Extremity/Trunk Assessment   Upper Extremity Assessment RUE Deficits / Details: both UE tight to ROM - pt limited in right hand/finger extension. DId some gentle PROM of all joints.  did see some UE movement but ataxic and gross motor with poor control    Lower Extremity Assessment RLE Deficits / Details: legs tight in knee and hip flexors and adductors.  Pt with unable to get DF to neutral.  Did some gentle PROM of all joints.  limited AROM movement seen       Communication   Communication: Tracheostomy  Cognition Arousal/Alertness: Lethargic (due to recent ativan)   Overall Cognitive Status: Difficult to assess  General Comments: pt followed 1 step simple commands - put up finger, put up thumb etc.  provided orientation of time and place and situation      General Comments General comments (skin integrity, edema, etc.): pt ataxic - hitting bed rails - talked to nurse about getting padding for rails for pt safety    Exercises     Assessment/Plan    PT Assessment Patent does not need any further PT services  PT Problem List Decreased strength;Decreased mobility;Impaired tone;Decreased cognition;Cardiopulmonary status limiting activity;Decreased skin integrity;Decreased activity tolerance;Decreased range of motion;Decreased safety awareness       PT Treatment Interventions      PT Goals (Current goals can be found in the Care Plan section)  Acute Rehab PT Goals Patient Stated Goal: all assessment and education complete.  DC therapy - no skilled needs PT Goal Formulation: All assessment and education complete, DC therapy    Frequency     Barriers to discharge        Co-evaluation               AM-PAC PT "6 Clicks" Daily Activity  Outcome Measure Difficulty turning over in  bed (including adjusting bedclothes, sheets and blankets)?: Unable Difficulty moving from lying on back to sitting on the side of the bed? : Unable Difficulty sitting down on and standing up from a chair with arms (e.g., wheelchair, bedside commode, etc,.)?: Unable Help needed moving to and from a bed to chair (including a wheelchair)?: Total Help needed walking in hospital room?: Total Help needed climbing 3-5 steps with a railing? : Total 6 Click Score: 6    End of Session   Activity Tolerance: Treatment limited secondary to medical complications (Comment) Patient left: in bed Nurse Communication: Need for lift equipment PT Visit Diagnosis: Other symptoms and signs involving the nervous system (R29.898);Muscle weakness (generalized) (M62.81)    Time: 9030-0923 PT Time Calculation (min) (ACUTE ONLY): 20 min   Charges:   PT Evaluation $PT Eval High Complexity: 1 High     PT G Codes:        02/10/2017   Rande Lawman, PT   Loyal Buba 02-10-2017, 2:01 PM

## 2017-01-13 NOTE — Progress Notes (Signed)
Patient noted to have feeding coming thru trach. Feeding tube was stopped. On call triad notified. CXR done showing aspiration. Patient in no respiratory distress. Heart rate high 40's-50's. BP soft. Will continue to monitor

## 2017-01-13 NOTE — NC FL2 (Signed)
[place] LEVEL OF CARE SCREENING TOOL     IDENTIFICATION  Patient Name: Marc Rose Birthdate: 03/13/1951 Sex: male Admission Date (Current Location): 01/10/2017  [facility] and [place] Number:  Herbalist and Address:  [facility] [place], [place] [address] [place], [place] [address]      Provider Number: [id number]  Attending Physician Name and Address:  Allie Bossier, MD  Relative Name and Phone Number:  Chauncy Lean, sister, [phone]    Current Level of Care: Hospital Recommended Level of Care: [place] Prior Approval Number:    Date Approved/Denied:   PASRR Number: [id number]  Discharge Plan: SNF    Current Diagnoses: Patient Active Problem List   Diagnosis Date Noted  . Leukocytosis 01/10/2017  . Palliative care by specialist   . Tracheostomy status ([place])   . Acute on chronic respiratory failure with hypoxemia ([place])   . Protein-calorie malnutrition, severe 12/14/2016  . HCAP (healthcare-associated pneumonia)   . Sepsis due to undetermined organism ([place])   . Normochromic normocytic anemia   . Septic shock ([place]) 12/12/2016  . DNR (do not resuscitate) discussion   . Urinary tract infection without hematuria   . Pressure injury of skin 06/21/2016  . Palliative care encounter   . Goals of care, counseling/discussion   . Miller-Fisher variant Guillain-Barre syndrome ([place]) 06/10/2016  . Acute respiratory failure ([place]) 06/10/2016  . Malnutrition of moderate degree 06/10/2016  . Ataxic gait   . Muscle weakness (generalized)   . 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 Acute kidney injury (nontraumatic) ([place]) 06/04/2016  . Drug rash 11/12/2015  . Hyperpigmentation 05/15/2015  . Abdominal pain, right lower quadrant 02/05/2015  . Abscess, psoas ([place]) 02/05/2015  . Acute upper respiratory infection 02/05/2015  . Long term current use of antibiotics 02/05/2015  . Benign  essential HTN 02/05/2015  . Edema leg 02/05/2015  . Complex regional pain syndrome type 2 of upper extremity 02/05/2015  . Back pain, chronic 02/05/2015  . CN (constipation) 02/05/2015  . CD (contact dermatitis) 02/05/2015  . Hyperglycemia 02/05/2015  . LBP (low back pain) 02/05/2015  . Discitis of lumbar region 02/05/2015  . Disorder of nutrition 02/05/2015  . Feeling bilious 02/05/2015  . Flu vaccine need 02/05/2015  . Peripheral neuropathic pain 02/05/2015  . Non compliance with medical treatment 02/05/2015  . Abnormal blood chemistry 02/05/2015  . Angiopathy, peripheral ([place]) 02/05/2015  . Nerve disease, peripheral 02/05/2015  . Hypercholesterolemia without hypertriglyceridemia 02/05/2015  . Spinal stenosis 02/05/2015  . Buzzing in ear 02/05/2015  . Compulsive tobacco user syndrome 02/05/2015  . Bladder retention 02/05/2015  . Abnormal weight loss 02/05/2015  . Diskitis 02/05/2015  . Hardware complicating wound infection ([place]) 02/05/2015  . Hyperlipidemia 07/05/2014  . Peripheral arterial disease ([place]) 07/05/2014  . Hypotension 09/16/2013  . HTN (hypertension) 09/16/2013  . Anxiety 09/16/2013  . Back pain 09/16/2013    Orientation RESPIRATION BLADDER Height & Weight        Tracheostomy Incontinent, Indwelling catheter Weight:  (unable; bed not zeroed) Height:     BEHAVIORAL SYMPTOMS/MOOD NEUROLOGICAL BOWEL NUTRITION STATUS      Incontinent  (please see DC summary)  AMBULATORY STATUS COMMUNICATION OF NEEDS Skin   Total Care Non-Verbally (patient nods or shakes head, opens mouth to attempt speech) PU Stage and Appropriate Care (PU unstageable R foot, gauze dressing; PU stage 1 sacrum, foam dressing)  Personal Care Assistance Level of Assistance  Total care       Total Care Assistance: Maximum assistance   Functional Limitations Info  Sight, Hearing, Speech Sight Info: Adequate Hearing Info: Adequate Speech Info: Impaired (trach)     SPECIAL CARE FACTORS FREQUENCY                       Contractures Contractures Info: Not present    Additional Factors Info  Code Status, Allergies, Psychotropic, Insulin Sliding Scale Code Status Info: DNR Allergies Info: Codeine, Penicillins, Latex, Strawberry Extract Psychotropic Info: klonipin, seroquel, zoloft Insulin Sliding Scale Info: insulin every 4 hours       Current Medications (01/13/2017):  This is the current hospital active medication list Current Facility-Administered Medications  Medication Dose Route Frequency Provider Last Rate Last Dose  . albuterol (PROVENTIL) (2.5 MG/3ML) 0.083% nebulizer solution 2.5 mg  2.5 mg Nebulization Q4H PRN Cherene Altes, MD      . aspirin chewable tablet 81 mg  81 mg Per Tube Daily Jani Gravel, MD   81 mg at 01/13/17 1019  . bisacodyl (DULCOLAX) suppository 10 mg  10 mg Rectal PRN Jani Gravel, MD      . chlorhexidine (PERIDEX) 0.12 % solution 15 mL  15 mL Mouth/Throat BID Jani Gravel, MD   15 mL at 01/13/17 1016  . clonazePAM (KLONOPIN) tablet 0.5 mg  0.5 mg Per Tube Daily Jani Gravel, MD   0.5 mg at 01/13/17 1018  . dextrose 5 %-0.45 % sodium chloride infusion   Intravenous Continuous Schorr, Rhetta Mura, NP 75 mL/hr at 01/13/17 0420    . diphenhydrAMINE (BENADRYL) 12.5 MG/5ML elixir 25 mg  25 mg Per Tube Q6H PRN Cherene Altes, MD      . sennosides (SENOKOT) 8.8 MG/5ML syrup 5 mL  5 mL Per Tube Daily PRN Cherene Altes, MD       And  . docusate (COLACE) 50 MG/5ML liquid 50 mg  50 mg Per Tube Daily PRN Joette Catching T, MD      . enoxaparin (LOVENOX) injection 40 mg  40 mg Subcutaneous Q24H Cherene Altes, MD   40 mg at 01/13/17 1016  . feeding supplement (JEVITY 1.2 CAL) liquid 1,000 mL  1,000 mL Per Tube Continuous Allie Bossier, MD   Stopped at 01/12/17 2310  . feeding supplement (PRO-STAT SUGAR FREE 64) liquid 30 mL  30 mL Per Tube Daily Jani Gravel, MD   30 mL at 01/13/17 1016  . fludrocortisone  (FLORINEF) tablet 0.2 mg  0.2 mg Per Tube Daily Jani Gravel, MD   0.2 mg at 01/13/17 1018  . fluticasone (FLONASE) 50 MCG/ACT nasal spray 1 spray  1 spray Each Nare Daily PRN Jani Gravel, MD      . free water 200 mL  200 mL Per Tube BID Jani Gravel, MD   200 mL at 01/13/17 1021  . gabapentin (NEURONTIN) capsule 300 mg  300 mg Per Tube TID Jani Gravel, MD   300 mg at 01/13/17 1018  . guaiFENesin-dextromethorphan (ROBITUSSIN DM) 100-10 MG/5ML syrup 5 mL  5 mL Per Tube Q8H Cherene Altes, MD   5 mL at 01/12/17 2205  . insulin aspart (novoLOG) injection 0-9 Units  0-9 Units Subcutaneous Q4H Allie Bossier, MD   1 Units at 01/12/17 1725  . ipratropium-albuterol (DUONEB) 0.5-2.5 (3) MG/3ML nebulizer solution 3 mL  3 mL Nebulization TID Cherene Altes, MD   3 mL  at 01/13/17 0848  . levothyroxine (SYNTHROID, LEVOTHROID) tablet 75 mcg  75 mcg Per Tube QAC breakfast Jani Gravel, MD   75 mcg at 01/13/17 1019  . LORazepam (ATIVAN) tablet 1 mg  1 mg Per Tube Q4H PRN Cherene Altes, MD   1 mg at 01/13/17 1018  . meropenem (MERREM) 1 g in sodium chloride 0.9 % 100 mL IVPB  1 g Intravenous Q8H Dang, Thuy D, RPH 200 mL/hr at 01/13/17 1019 1 g at 01/13/17 1019  . metoCLOPramide (REGLAN) 5 MG/5ML solution 10 mg  10 mg Per Tube Q8H Cherene Altes, MD   10 mg at 01/13/17 1017  . mineral oil enema 1 enema  1 enema Rectal Q12H PRN Jani Gravel, MD      . multivitamin liquid 15 mL  15 mL Per Tube Daily Cherene Altes, MD   15 mL at 01/13/17 1016  . ondansetron (ZOFRAN) tablet 4 mg  4 mg Per Tube Q6H PRN Jani Gravel, MD      . pantoprazole sodium (PROTONIX) 40 mg/20 mL oral suspension 40 mg  40 mg Per Tube BID Cherene Altes, MD   40 mg at 01/13/17 1016  . polyethylene glycol (MIRALAX / GLYCOLAX) packet 17 g  17 g Per Tube Daily PRN Jani Gravel, MD      . polyvinyl alcohol (LIQUIFILM TEARS) 1.4 % ophthalmic solution 1 drop  1 drop Both Eyes TID Jani Gravel, MD   1 drop at 01/13/17 1020  . QUEtiapine  (SEROQUEL) tablet 50 mg  50 mg Per Tube BID Jani Gravel, MD   50 mg at 01/13/17 1018  . scopolamine (TRANSDERM-SCOP) 1 MG/3DAYS 1.5 mg  1 patch Transdermal Q72H Jani Gravel, MD   1.5 mg at 01/11/17 1121  . sertraline (ZOLOFT) tablet 50 mg  50 mg Per Tube Daily Jani Gravel, MD   50 mg at 01/13/17 1018  . simethicone (MYLICON) chewable tablet 160 mg  160 mg Per Tube q12n4p Jani Gravel, MD   160 mg at 01/13/17 1018  . sucralfate (CARAFATE) tablet 1 g  1 g Per Tube BID Jani Gravel, MD   1 g at 01/13/17 1017  . thiamine (VITAMIN B-1) tablet 100 mg  100 mg Per Tube Daily Jani Gravel, MD   100 mg at 01/13/17 1019  . vancomycin (VANCOCIN) IVPB 750 mg/150 ml premix  750 mg Intravenous Q12H Honor Loh, RPH 150 mL/hr at 01/13/17 0538 750 mg at 01/13/17 0538  . vitamin A & D ointment 1 application  1 application Topical Daily Jani Gravel, MD   1 application at 22/29/79 1021  . vitamin C (ASCORBIC ACID) tablet 500 mg  500 mg Per Tube BID Jani Gravel, MD   500 mg at 01/13/17 1018  . zinc oxide 20 % ointment   Topical BID Allie Bossier, MD      . zinc sulfate capsule 220 mg  220 mg Per Tube Daily Jani Gravel, MD   220 mg at 01/13/17 1017     Discharge Medications: Please see discharge summary for a list of discharge medications.  Relevant Imaging Results:  Relevant Lab Results:   Additional Information SSN: [id number], tracheostomy 76mm shiley cuffed  Estanislado Emms, LCSW

## 2017-01-13 NOTE — Progress Notes (Signed)
Pharmacy Antibiotic Note  Marc Rose is a 66 y.o. male admitted on 01/10/2017 with PNA and started on vancomycin and aztreonam (PCN allergy, tolerated cephalosporin in the past).  Now with aspiration and Pharmacy has been consulted to change aztreonam to Merrem.  Renal function stable.   Plan: Merrem 1gm IV Q8H Monitor renal fxn, clinical progress   Weight: 145 lb 3.2 oz (65.9 kg)  Temp (24hrs), Avg:97.3 F (36.3 C), Min:96 F (35.6 C), Max:98.4 F (36.9 C)   Recent Labs Lab 01/10/17 1559 01/10/17 1613 01/11/17 0048 01/11/17 1010 01/11/17 1839  WBC 13.8*  --  10.8* 7.3 7.4  CREATININE 1.05  --  0.76  --   --   LATICACIDVEN  --  1.09  --   --   --     Estimated Creatinine Clearance: 84.7 mL/min (by C-G formula based on SCr of 0.76 mg/dL).    Allergies  Allergen Reactions  . Codeine Anaphylaxis, Hives and Swelling  . Penicillins Anaphylaxis, Hives and Swelling    Has patient had a PCN reaction causing immediate rash, facial/tongue/throat swelling, SOB or lightheadedness with hypotension: Yes Has patient had a PCN reaction causing severe rash involving mucus membranes or skin necrosis: Unknown Has patient had a PCN reaction that required hospitalization: Unknown Has patient had a PCN reaction occurring within the last 10 years: Unknown If all of the above answers are "NO", then may proceed with Cephalosporin use.   . Latex Itching  . Strawberry Extract Hives    9/10 Vanc>> (9/19) 9/10 Aztreonam>> 9/13 Merrem 9/13 >>  9/10 UCx>>  >100k Proteus Mirabilis 9/10 BCx2>> NGTD 9/10 TA>> GPR / GPC / GNR / yeast on Gram stain 9/11 PEG site >> NGTD   Libby Goehring D. Mina Marble, PharmD, BCPS Pager:  [phone] 01/13/2017, 2:23 AM

## 2017-01-13 NOTE — Clinical Social Work Note (Signed)
Clinical Social Work Assessment  Patient Details  Name: Marc Rose MRN: [id number] Date of Birth: 10/14/1950  Date of referral:  01/13/17               Reason for consult:  Facility Placement (from [facility])                Permission sought to share information with:  Family Supports, Facility Contact Representative Permission granted to share information::  Yes, Verbal Permission Granted  Name::     [name] Bell  Agency::  [facility]  Relationship::  sister  Contact Information:  [phone]  Housing/Transportation Living arrangements for the past 2 months:  Skilled Nursing Facility Source of Information:  Other (Comment Required) (sister) Patient Interpreter Needed:  None Criminal Activity/Legal Involvement Pertinent to Current Situation/Hospitalization:  No - Comment as needed Significant Relationships:  Siblings, Friend Lives with:  Facility Resident Do you feel safe going back to the place where you live?  Yes Need for family participation in patient care:  Yes (Comment)  Care giving concerns: Patient from [facility] since March. Sister lives in [place]. Patient on trach and unable to verbalize much.   Social Worker assessment / plan: CSW met with patient briefly at bedside. Patient nodded in response to questions. CSSW spoke to patient's sister, [name], via phone. Sister agreeable for patient to return to [facility]. CSW to follow and support with discharge to SNF when ready.  Employment status:  Retired Insurance information:  Managed Care ([employer]) PT Recommendations:  Not assessed at this time Information / Referral to community resources:  Skilled Nursing Facility  Patient/Family's Response to care: Sister appreciative of care.  Patient/Family's Understanding of and Emotional Response to Diagnosis, Current Treatment, and Prognosis: Sister with good understanding of patient's condition and in contact with medical team. Sister hopeful for some  recovery, as not long ago patient was able to call her and some other family members and speak via phone.  Emotional Assessment Appearance:  Appears stated age Attitude/Demeanor/Rapport:  Other Affect (typically observed):  Calm Orientation:    Alcohol / Substance use:  Not Applicable Psych involvement (Current and /or in the community):  No (Comment)  Discharge Needs  Concerns to be addressed:  Discharge Planning Concerns Readmission within the last 30 days:  Yes Current discharge risk:  Physical Impairment Barriers to Discharge:  Continued Medical Work up    , LCSW 01/13/2017, 12:08 PM  

## 2017-01-13 NOTE — Progress Notes (Signed)
Daily Progress Note   Patient Name: Marc Rose       Date: 01/13/2017 DOB: 1950/12/09  Age: 66 y.o. MRN#: [id number] Attending Physician: Allie Bossier, MD Primary Care Physician: Patient, [name] Admit Date: 01/10/2017  Reason for Consultation/Follow-up: Establishing goals of care, Hospice Evaluation and Psychosocial/spiritual support  Subjective: Marc Rose is resting quietly in bed, he will make and keep eye contact. He is able to mouth his answers to simple questions. There is no family of bedside at this time. We talk about my worry about his tube feeding aspiration. He has a wet productive cough. He nods his agreement when I share my concern over his cough. I share that we can't change his aspiration, and I want to make sure that he is comfortable. We talk about the concepts of treat the treatable, but allow a natural death (DNR). Marc Rose clearly agrees to DNR.  Call to sister, Chauncy Lean at her home number listed in chart. [name] also agrees to allow a natural death. She states that she had some hope that her brother would be able to recover, but she now sees that he is not improving. We talk about his continued decline, his frailty, his recurrent aspiration. Also talk about his muscle wasting, contractures, and wounds. [name] shares that Marc Rose had been aspirating previously, but "they fixed that". We talk about aspiration pneumonia, especially related to PEG tube feedings. [name] states that every time the facility tried to increase Marc Rose tube feeding rate, it led to aspiration. I share my concern over his nutritional status which is low, but his risk for aspiration with tube feeding.  [name] states that Marc Rose neighbor here in [place], Shawn Route, obtained  legal guardianship through the state. [name] states that she speaks to [name] regularly about Marc Rose care. I share that I will call [name].  Call to Shawn Route at her home number listed in chart. I update her on Marc Rose current condition. We talk about his frailty. I ask when [name] last saw Marc Rose. She states, "I've not seen him in a while". I ask if it's been 2 months, 4 months, she tells me "something like that". So I say 4 months, she does not dissuade me. I mention about his tube feeding aspiration. [name] states  that Marc Rose fell a week or 2 ago. I share that that's unfortunate that he fell, but I returned to the subject of tube feeding and aspiration. [name] seems to have limited health care literacy. We talk about pneumonia. I share that he has pneumonia and his left lung, and nail tube feeding in the base of his right lung. [name] is surprised by this. I share that there is no treatment that can change aspiration, and he will likely continue to have to feed regurgitate to his lungs.    I share my concern about his low nutritional status, albumin of 2.1. I share that this would qualify him for hospice services. [name] states that Marc Rose has fought hard, but he would not want to stay with this quality of life. She states that he was an active man and it is difficult for him to be in this condition. [name] also states is difficult for her to see him in this condition. She states that he is in or government donor and "I know what will happen, they would take his organs". We talk about organ donations, and that he would likely not qualify. We talk about the concepts of let nature take its course. I broached the subject of do not treat the next infection. [name] readily agrees. She states, "he's not getting better". We discuss do not rehospitalized, comfort treatment only. Again, [name] readily agrees. She agrees for him to [facility] to [facility] with comfort measures only, not to return to  the hospital. She states his quality is gone.  Conference with Dr. Sherral Hammers regarding patient care. Conference with Education officer, museum and case manager regarding patient care. Conference with nursing staff regarding patient care. Conference with speech therapy in respiratory therapy regarding patient care.   Length of Stay: 3  Current Medications: Scheduled Meds:  . aspirin  81 mg Per Tube Daily  . chlorhexidine  15 mL Mouth/Throat BID  . clonazePAM  0.5 mg Per Tube Daily  . enoxaparin (LOVENOX) injection  40 mg Subcutaneous Q24H  . feeding supplement (PRO-STAT SUGAR FREE 64)  30 mL Per Tube Daily  . fludrocortisone  0.2 mg Per Tube Daily  . free water  200 mL Per Tube BID  . gabapentin  300 mg Per Tube TID  . guaiFENesin-dextromethorphan  5 mL Per Tube Q8H  . insulin aspart  0-9 Units Subcutaneous Q4H  . ipratropium-albuterol  3 mL Nebulization TID  . levothyroxine  75 mcg Per Tube QAC breakfast  . metoCLOPramide  10 mg Per Tube Q8H  . multivitamin  15 mL Per Tube Daily  . pantoprazole sodium  40 mg Per Tube BID  . polyvinyl alcohol  1 drop Both Eyes TID  . QUEtiapine  50 mg Per Tube BID  . scopolamine  1 patch Transdermal Q72H  . sertraline  50 mg Per Tube Daily  . simethicone  160 mg Per Tube q12n4p  . sucralfate  1 g Per Tube BID  . thiamine  100 mg Per Tube Daily  . vitamin A & D  1 application Topical Daily  . vitamin C  500 mg Per Tube BID  . zinc oxide   Topical BID  . zinc sulfate  220 mg Per Tube Daily    Continuous Infusions: . dextrose 5 % and 0.45% NaCl 75 mL/hr at 01/13/17 0420  . feeding supplement (JEVITY 1.2 CAL) Stopped (01/12/17 2310)  . meropenem (MERREM) IV 1 g (01/13/17 1019)  . vancomycin 750 mg (01/13/17  Maribel.Amber)    PRN Meds: albuterol, bisacodyl, diphenhydrAMINE, docusate **AND** sennosides, fluticasone, LORazepam, mineral oil, ondansetron, polyethylene glycol  Physical Exam  Constitutional: No distress.  HENT:  Head: Atraumatic.  Cardiovascular:  Normal rate and regular rhythm.   Pulmonary/Chest: No respiratory distress.  Productive cough  Abdominal: Soft. He exhibits no distension.  PEG tube  Musculoskeletal: He exhibits deformity. He exhibits no edema.  contractures  Neurological: He is alert.  Nursing note and vitals reviewed.           Vital Signs: BP 121/72 (BP Location: Right Arm)   Pulse 69   Temp 97.7 F (36.5 C) (Oral)   Resp 18   Wt 65.9 kg (145 lb 3.2 oz)   SpO2 100%   BMI 18.64 kg/m  SpO2: SpO2: 100 % O2 Device: O2 Device: Tracheostomy Collar O2 Flow Rate: O2 Flow Rate (L/min): 5 L/min  Intake/output summary:  Intake/Output Summary (Last 24 hours) at 01/13/17 1238 Last data filed at 01/13/17 1217  Gross per 24 hour  Intake           2572.5 ml  Output             1400 ml  Net           1172.5 ml   LBM: Last BM Date:  (prior to arrival) Baseline Weight: Weight: 66.4 kg (146 lb 6.4 oz) Most recent weight: Weight:  (unable; bed not zeroed)       Palliative Assessment/Data:      Patient Active Problem List   Diagnosis Date Noted  . Leukocytosis 01/10/2017  . Palliative care by specialist   . Tracheostomy status ([place])   . Acute on chronic respiratory failure with hypoxemia ([place])   . Protein-calorie malnutrition, severe 12/14/2016  . HCAP (healthcare-associated pneumonia)   . Sepsis due to undetermined organism ([place])   . Normochromic normocytic anemia   . Septic shock ([place]) 12/12/2016  . DNR (do not resuscitate) discussion   . Urinary tract infection without hematuria   . Pressure injury of skin 06/21/2016  . Palliative care encounter   . Goals of care, counseling/discussion   . 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 Acute respiratory failure ([place]) 06/10/2016  . Malnutrition of moderate degree 06/10/2016  . Ataxic gait   . Muscle weakness (generalized)   . 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 Acute kidney injury (nontraumatic)  ([place]) 06/04/2016  . Drug rash 11/12/2015  . Hyperpigmentation 05/15/2015  . Abdominal pain, right lower quadrant 02/05/2015  . Abscess, psoas ([place]) 02/05/2015  . Acute upper respiratory infection 02/05/2015  . Long term current use of antibiotics 02/05/2015  . Benign essential HTN 02/05/2015  . Edema leg 02/05/2015  . Complex regional pain syndrome type 2 of upper extremity 02/05/2015  . Back pain, chronic 02/05/2015  . CN (constipation) 02/05/2015  . CD (contact dermatitis) 02/05/2015  . Hyperglycemia 02/05/2015  . LBP (low back pain) 02/05/2015  . Discitis of lumbar region 02/05/2015  . Disorder of nutrition 02/05/2015  . Feeling bilious 02/05/2015  . Flu vaccine need 02/05/2015  . Peripheral neuropathic pain 02/05/2015  . Non compliance with medical treatment 02/05/2015  . Abnormal blood chemistry 02/05/2015  . Angiopathy, peripheral ([place]) 02/05/2015  . Nerve disease, peripheral 02/05/2015  . Hypercholesterolemia without hypertriglyceridemia 02/05/2015  . Spinal stenosis 02/05/2015  . Buzzing in ear 02/05/2015  . Compulsive tobacco user syndrome 02/05/2015  . Bladder retention 02/05/2015  . Abnormal weight loss 02/05/2015  .  Diskitis 02/05/2015  . Hardware complicating wound infection ([place]) 02/05/2015  . Hyperlipidemia 07/05/2014  . Peripheral arterial disease ([place]) 07/05/2014  . Hypotension 09/16/2013  . HTN (hypertension) 09/16/2013  . Anxiety 09/16/2013  . 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  Assessment: Septic shock dt Pseudomonas HCAP, acute on chronic hypoxic resp failure: antibx, continue with breathing treatments. Marc Rose elects DNR. Call to sister Albin Felling in [place], she agrees. Call to official guardian/HCPOA, Shawn Route, who also agrees. [name] states that she would like comfort and dignity for Marc Rose.   Recommendations/Plan:  Return to [facility], comfort measures only.   MOST form completed.   Do not rehospitalize  Goals of Care and Additional Recommendations:  Limitations on Scope of Treatment: Full Comfort Care  Code Status:    Code Status Orders        Start     Ordered   01/10/17 2300  Full code  Continuous     01/10/17 2300    Code Status History    Date Active Date Inactive Code Status Order ID Comments User Context   12/12/2016  3:56 AM 12/22/2016  7:26 PM Full Code [id number]  Reyne Dumas, MD ED   06/03/2016  3:17 PM 06/25/2016  9:21 PM Full Code [id number]  Tawni Millers, MD Inpatient   09/16/2013 10:03 PM 09/19/2013  4:23 PM Full Code [id number]  Doree Albee, MD ED       Prognosis:   < 4 weeks, not be surprising based on frailty, albumin of 2.1, recurrent aspiration, legal guardians desire to focus on comfort and dignity at end-of-life.  Discharge Planning:   [facility] to [facility], as comfort care only, comfort and dignity at end-of-life. Do not rehospitalize.   Care plan was discussed with nursing staff, case manager, social worker, speech therapy, respiratory therapy, [facility] Rep. Berniece Andreas, RN and Dr. Sherral Hammers.  Thank you for allowing the Palliative Medicine Team to assist in the care of this patient.   Time In: 0910 1300 Time Out: 1000 1430 Total Time 50+90=140 min  Prolonged Time Billed  yes       Greater than 50%  of this time was spent counseling and coordinating care related to the above assessment and plan.  Drue Novel, NP  Please contact Palliative Medicine Team phone at [phone] for  questions and concerns.

## 2017-01-13 NOTE — Progress Notes (Signed)
CSW following for discharge back to [facility]. CSW consulted with palliative NP and MD. Plan was made for patient to return to [facility] with comfort measures tomorrow, 9/14, with patient consent. SNF requesting med list by 3pm today to get it filled for patient discharge tomorrow. CSW unable to get meds list to them by 3. CSW left message with SNF requesting call back to discuss and make plan for SNF to obtain patient meds so patient can return 9/14 as planned. CSW to follow and support with discharge.  Marc Rose, [place]

## 2017-01-13 NOTE — Progress Notes (Signed)
PROGRESS NOTE    Marc Rose  WUJ:[id number] DOB: [birth year] DOA: 01/10/2017 PCP: Patient, [name]   Brief Narrative:  66 y.o. 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     [facility]/ Fever and acute hypoxic resp failure. On arrival to the ER he was in shock with BP 50/30 and minimally responsive only withdrawing to pain. He had foul smelling urine, a large unstageable ulcer on his foot and pulmonary infiltrates on CXR.Admitted by pulmonary critical care. 8/15: bronchoscopy- Obstruction RLL, RML, BI, lavaged(pus)-  brushing performed  8/15- care transferred to hospitalist service Tracheal aspirate grew Pseudomonas. Pt was discharge on 8/22 on levaquin/ doxycycline.     Pt today apparently had hypotension, and blood in foley catheter.   CXR => 9/10 at [facility]=> increasing diffuse interstial opacities may represent increasing pneumonitis or pneumonia.     CXR 9/10=> [facility] IMPRESSION: Improving airspace opacity throughout the right lung with residual right upper lobe scarring. New patchy opacity in the left mid lung, question pneumonia.           Subjective: 9/13 alert nods yes and no to questions indicates negative CP, negative SOB, negative N/V, negative abdominal pain    Assessment & Plan:   Active Problems:   Hypotension   Hyperglycemia   Miller-Fisher variant Guillain-Barre syndrome (HCC)   HCAP (healthcare-associated pneumonia)   Normochromic normocytic anemia   Leukocytosis   Encounter for hospice care discussion   Aspiration pneumonia of right lower lobe due to regurgitated food ([facility]aw   HCAP   [place]? --Not fully convinced patient has pneumonia. Lactic acid negative, negative fever, negative leukocytosis. PCXR patchy opacification in lingula -Given patient's debilitated state continue antibiotics until tracheal aspirate cultures return. -Patient spikes fever or leukocytosis would double cover for Pseudomonas given patient's previous/recent pneumonia. -DuoNeb QID -Solu-Medrol 60 mg daily -Robitussin-DM TID -After conference with PALLIATIVE CARE [facility]    Chronic Hypoxic respiratory failure -Possible developing lingula pneumonia currently on trach collar at FiO2 40% which is baseline -Frequent suctioning  -Chronic vent since GBS 06/2016: PRN    Anemia(baseline Hgb 8-10)  Recent Labs Lab 01/10/17 1559 01/11/17 0048 01/11/17 1010 01/11/17 1839 01/13/17 0219  HGB 7.3* 7.8* 8.2* 8.6* 8.6*  -Baseline -Anemia panel pending -Occult blood pending    Acute vs Chronic Encephalopathy -Patient appears at baseline, follows all commands nods head yes and no questions    Chronic diastolic CHF -Strict in and out since admission +9 L -Daily weight Filed Weights   01/10/17 2318 01/12/17 0500  Weight: 146 lb 6.4 oz (66.4 kg) 145 lb 3.2 oz (65.9 kg)  -Transfuse for hemoglobin<8   Pulmonary hypertension -See CHF    Chronic Hypotension --After conference with PALLIATIVE CARE [facility]   Acute renal failure  -Resolved from previous admission   Nutrition/Severe Protein Calorie malnutrition -PEG tube dependent -Continue pro-stat 30 ml per day -Nutrition consult -Free water 2062mBID   Unstageable LEFT Foot Ulcer  -Continue  to space boots   DM type II controlled without, complication -06/11/54  Hemoglobin A1c = 5.7  -Sensitive SSI   Hypothyroidism -Synthroid 75 g daily   Hypokalemia -Potassium goal> 4   Hypomagnesemia -Wheezing  though> 2    Goals of care --9/13 After conference with [place] patient will be transferred to [facility] with comfort measures on 9/14       DVT prophylaxis: Lovenox Code Status: Full Family Communication: None Disposition Plan: TBD. Awaiting PALLIATIVE CARE recommendations   Consultants:  None   Procedures/Significant Events:  8/15 BAL (previous admission):-Obstruction RLL, RML, BI, lavaged clean moderate -BAL RLL, yellow pus.- Left l;ung wnl 8/22 discharged to [facility]     I have personally reviewed and interpreted all radiology studies and my findings are as above.  VENTILATOR SETTINGS: Trach collar -FiO2: 40% -SPO2: 93%    Cultures FROM PREVIOUS ADMISSION 8/12 Tracheal Aspirate positive pseudomonas aeruginosa 8/12 Blood cultures: Negative final  8/12 Urine culture Negative final 8/12 strep pneumo/Legionella urine antigen negative 8/15 BAL: Positive multiple organisms none predominant 8/20 blood 1/2 Positive GPC   9/10 blood pending 9/10 urine pending 9/10 tracheal aspirate pending 9/11 abdominal discharge PEG pending      Antimicrobials: Anti-infectives    Start     Stop   01/11/17 0600  vancomycin (VANCOCIN) IVPB 750 mg/150 ml premix     01/19/17 0559   01/10/17 2200  aztreonam (AZACTAM) 2 g in dextrose 5 % 50 mL IVPB         01/10/17 1845  vancomycin (VANCOCIN) IVPB 1000 mg/200 mL premix     01/10/17 1958       Devices    LINES / TUBES:  Trach>>    Continuous Infusions: . dextrose 5 % and 0.45% NaCl 75 mL/hr at 01/13/17 1800  . feeding supplement (JEVITY 1.2 CAL) Stopped (01/12/17 2310)     Objective: Vitals:   01/13/17 1428 01/13/17 1600 01/13/17 2023 01/13/17 2050  BP:  111/83 (!) 129/106   Pulse:  62  69  Resp:  _0 Temp:  97.9 F (36.6 C) 97.9 F (36.6 C)   TempSrc:  Oral Axillary   SpO2: 99%  98% 91%  Weight:        Intake/Output Summary (Last 24 hours) at 01/13/17 2217 Last data  filed at 01/13/17 1835  Gross per 24 hour  Intake          2853.75 ml  Output             1325 ml  Net          1528.75 ml   Filed Weights   01/10/17 2318 01/12/17 0500  Weight: 146 lb 6.4 oz (66.4 kg) 145 lb 3.2 oz (65.9 kg)    Examination:  General: Alert, nods yes and no questions, follows commands positive chronic respiratory distress, cachectic Neck:  Negative scars, masses, torticollis, lymphadenopathy, JVD, #6 cuffed trach in place covered and clean, thick milky tracheal discharge Lungs: coarse breath sounds diffusely, negative wheezes or crackles Cardiovascular: Regular rate and rhythm without murmur gallop or rub normal S1 and S2 Abdomen: negative abdominal pain, nondistended, positive soft, bowel sounds, no rebound, no ascites, no appreciable mass, PEG tube present : Green discharge around PEG site  Extremities: No significant cyanosis, clubbing, bilateral lower extremity atrophy, left foot with unstageable plantar lesion (thick scab, chronic)  Psychiatric:  Unable to assess Central nervous system:  Patient moves all extremities to command, nods yes and no to questions  negative receptive aphasia.  Marland Kitchen  Data Reviewed: Care during the described time interval was provided by me .  I have reviewed this patient's available data, including medical history, events of note, physical examination, and all test results as part of my evaluation.   CBC:  Recent Labs Lab 01/10/17 1559 01/11/17 0048 01/11/17 1010 01/11/17 1839 01/13/17 0219  WBC 13.8* 10.8* 7.3 7.4 4.8  NEUTROABS 12.0*  --   --   --   --   HGB 7.3* 7.8* 8.2* 8.6* 8.6*  HCT 22.6* 24.5*  28.9* 26.1* 27.2* 27.3*  MCV 86.6 87.2 87.0 87.2 86.4  PLT 290 261 235 269 045   Basic Metabolic Panel:  Recent Labs Lab 01/10/17 1559 01/11/17 0048 01/13/17 0219  NA 138 139 139  K 3.8 3.8 3.3*  CL 101 108 109  CO2 _0 GLUCOSE 132* 94 95  BUN 65* 53* 27*  CREATININE 1.05 0.76 0.52*  CALCIUM 8.6* 8.1* 8.3*     GFR: Estimated Creatinine Clearance: 84.7 mL/min (A) (by C-G formula based on SCr of 0.52 mg/dL (L)). Liver Function Tests:  Recent Labs Lab 01/10/17 1559 01/11/17 0048  AST 24 23  ALT 19 20  ALKPHOS 79 69  BILITOT 0.3 0.4  PROT 6.1* 5.7*  ALBUMIN 2.2* 2.1*   No results for input(s): LIPASE, AMYLASE in the last 168 hours. No results for input(s): AMMONIA in the last 168 hours. Coagulation Profile:  Recent Labs Lab 01/10/17 1559  INR 1.33   Cardiac Enzymes:  Recent Labs Lab 01/11/17 0048 01/11/17 1010  TROPONINI <0.03 <0.03   BNP (last 3 results) No results for input(s): PROBNP in the last 8760 hours. HbA1C:  Recent Labs  01/11/17 0048  HGBA1C 5.9*   CBG:  Recent Labs Lab 01/13/17 0403 01/13/17 0905 01/13/17 1212 01/13/17 1656 01/13/17 2038  GLUCAP 83 96 101* 107* 91   Lipid Profile: No results for input(s): CHOL, HDL, LDLCALC, TRIG, CHOLHDL, LDLDIRECT in the last 72 hours. Thyroid Function Tests: No results for input(s): TSH, T4TOTAL, FREET4, T3FREE, THYROIDAB in the last 72 hours. Anemia Panel:  Recent Labs  01/11/17 0048 01/11/17 2050  VITAMINB12 881 1,356*  FOLATE  --  27.0  FERRITIN 220 282  TIBC 155* 185*  IRON 16* 41*  RETICCTPCT  --  0.6   Urine analysis:    Component Value Date/Time   COLORURINE PINK (A) 01/10/2017 1725   APPEARANCEUR CLOUDY (A) 01/10/2017 1725   LABSPEC 1.017 01/10/2017 1725   PHURINE 7.0 01/10/2017 1725   GLUCOSEU NEGATIVE 01/10/2017 1725   HGBUR LARGE (A) 01/10/2017 1725   BILIRUBINUR NEGATIVE 01/10/2017 1725   KETONESUR NEGATIVE 01/10/2017 1725   PROTEINUR 100 (A) 01/10/2017 1725   UROBILINOGEN 1.0 09/16/2013 2001   NITRITE NEGATIVE 01/10/2017 1725   LEUKOCYTESUR LARGE (A) 01/10/2017 1725   Sepsis Labs: _1 (procalcitonin:4,lacticidven:4)  ) Recent Results (from the past 240 hour(s))  Culture, blood (Routine x 2)     Status: None (Preliminary result)   Collection Time: 01/10/17  3:59 PM   Result Value Ref Range Status   Specimen Description BLOOD RIGHT FOREARM  Final   Special Requests   Final    BOTTLES DRAWN AEROBIC AND ANAEROBIC Blood Culture adequate volume   Culture NO GROWTH 3 DAYS  Final   Report Status PENDING  Incomplete  Culture, blood (Routine x 2)     Status: None (Preliminary result)   Collection Time: 01/10/17  4:02 PM  Result Value Ref Range Status   Specimen Description BLOOD LEFT  FOREARM  Final   Special Requests   Final    BOTTLES DRAWN AEROBIC AND ANAEROBIC Blood Culture adequate volume   Culture NO GROWTH 3 DAYS  Final   Report Status PENDING  Incomplete  Urine culture     Status: Abnormal   Collection Time: 01/10/17  5:25 PM  Result Value Ref Range Status   Specimen Description URINE, CATHETERIZED  Final   Special Requests NONE  Final   Culture >=100,000 COLONIES/mL PROTEUS MIRABILIS (A)  Final   Report Status 01/13/2017 FINAL  Final   Organism ID, Bacteria PROTEUS MIRABILIS (A)  Final      Susceptibility   Proteus mirabilis - MIC*    AMPICILLIN <=2 SENSITIVE Sensitive     CEFAZOLIN <=4 SENSITIVE Sensitive     CEFTRIAXONE <=1 SENSITIVE Sensitive     CIPROFLOXACIN >=4 RESISTANT Resistant     GENTAMICIN <=1 SENSITIVE Sensitive     IMIPENEM 2 SENSITIVE Sensitive     NITROFURANTOIN 128 RESISTANT Resistant     TRIMETH/SULFA <=20 SENSITIVE Sensitive     AMPICILLIN/SULBACTAM <=2 SENSITIVE Sensitive     PIP/TAZO <=4 SENSITIVE Sensitive     * >=100,000 COLONIES/mL PROTEUS MIRABILIS  Culture, respiratory (NON-Expectorated)     Status: None   Collection Time: 01/10/17 11:44 PM  Result Value Ref Range Status   Specimen Description TRACHEAL ASPIRATE  Final   Special Requests NONE  Final   Gram Stain   Final    ABUNDANT WBC PRESENT, PREDOMINANTLY PMN FEW SQUAMOUS EPITHELIAL CELLS PRESENT FEW GRAM POSITIVE RODS RARE GRAM POSITIVE COCCI RARE GRAM NEGATIVE RODS RARE YEAST    Culture FEW Consistent with normal respiratory flora.  Final   Report  Status 01/13/2017 FINAL  Final  Aerobic Culture (superficial specimen)     Status: None (Preliminary result)   Collection Time: 01/11/17  4:42 PM  Result Value Ref Range Status   Specimen Description PEG SITE  Final   Special Requests Normal  Final   Gram Stain   Final    RARE WBC PRESENT,BOTH PMN AND MONONUCLEAR NO ORGANISMS SEEN    Culture CULTURE REINCUBATED FOR BETTER GROWTH  Final   Report Status PENDING  Incomplete         Radiology Studies: Dg Chest Port 1 View  Result Date: 01/12/2017 CLINICAL DATA:  Tube feeds coming out of tracheostomy. EXAM: PORTABLE CHEST 1 VIEW COMPARISON:  01/10/2017 FINDINGS: Tracheostomy tube at the thoracic inlet. Worsening right basilar opacities suspicious for aspiration in this setting. Right upper lobe opacity is unchanged from prior and likely scarring. Left lung base opacity is unchanged, improved left midlung opacity. Unchanged heart size and mediastinal contours. IMPRESSION: Increasing right basilar opacities suspicious for aspiration in this setting. Patchy left lower lobe opacity is unchanged. Mild improvement in left mid lung consolidation from prior. Electronically Signed   By: Jeb Levering M.D.   On: 01/12/2017 23:58        Scheduled Meds: . chlorhexidine  15 mL Mouth/Throat BID  . clonazePAM  0.5 mg Per Tube Daily  . feeding supplement (PRO-STAT SUGAR FREE 64)  30 mL Per Tube Daily  . free water  200 mL Per Tube BID  . gabapentin  300 mg Per Tube TID  . guaiFENesin-dextromethorphan  5 mL Per Tube Q8H  . insulin aspart  0-9 Units Subcutaneous Q4H  . ipratropium-albuterol  3 mL Nebulization TID  . levothyroxine  75 mcg Per Tube QAC breakfast  . metoCLOPramide  10 mg Per Tube Q8H  .  pantoprazole sodium  40 mg Per Tube BID  . polyvinyl alcohol  1 drop Both Eyes TID  . QUEtiapine  50 mg Per Tube BID  . scopolamine  1 patch Transdermal Q72H  . sertraline  50 mg Per Tube Daily  . simethicone  160 mg Per Tube q12n4p  .  sucralfate  1 g Per Tube BID  . vitamin A & D  1 application Topical Daily  . zinc oxide   Topical BID  . zinc sulfate  220 mg Per Tube Daily   Continuous Infusions: . dextrose 5 % and 0.45% NaCl 75 mL/hr at 01/13/17 1800  . feeding supplement (JEVITY 1.2 CAL) Stopped (01/12/17 2310)     LOS: 3 days    Time spent: 40 minutes    Tametha Banning, Geraldo Docker, MD [facility] Pager [phone]   If 7PM-7AM, please contact night-coverage [email or web address] Password [facility] 01/13/2017, 10:17 PM

## 2017-01-13 NOTE — Progress Notes (Signed)
Shift event note:  Notified by RN at approx 2315 regarding tube feeding noted coming out of trache. 02 sats remain WNL and there is no overt resp distress. Tube feeding was stopped. CXR obtained and findings c/w acute aspiration event. RN reports 02 sats remain WNL at this time and there are no changes in resp status. Interestingly,  he has become somewhat bradycardic in the 50's and 40's at times. No diaphoresis or significant hypotension. MAP currently 79. Meropenem added. Will continue to monitor closely on SDU.   Jeryl Columbia, NP-C [facility] Pager [phone]

## 2017-01-14 DIAGNOSIS — E860 Dehydration: Secondary | ICD-10-CM

## 2017-01-14 DIAGNOSIS — J690 Pneumonitis due to inhalation of food and vomit: Principal | ICD-10-CM

## 2017-01-14 LAB — GLUCOSE, CAPILLARY
GLUCOSE-CAPILLARY: 101 mg/dL — AB (ref 65–99)
Glucose-Capillary: 108 mg/dL — ABNORMAL HIGH (ref 65–99)
Glucose-Capillary: 101 mg/dL — ABNORMAL HIGH (ref 65–99)
Glucose-Capillary: 107 mg/dL — ABNORMAL HIGH (ref 65–99)

## 2017-01-14 MED ORDER — MINERAL OIL RE ENEM: 1.0000 | ENEMA | Freq: Two times a day (BID) | RECTAL | Status: DC | PRN

## 2017-01-14 MED ORDER — PRO-STAT SUGAR FREE PO LIQD: 30.0000 mL | Freq: Every day | ORAL | 0 refills | Status: AC

## 2017-01-14 MED ORDER — GUAIFENESIN-DM 100-10 MG/5ML PO SYRP: 5.0000 mL | ORAL_SOLUTION | Freq: Three times a day (TID) | ORAL | Status: DC | PRN

## 2017-01-14 MED ORDER — MORPHINE SULFATE 10 MG/5ML PO SOLN: 2.5000 mg | ORAL | 0 refills | Status: AC | PRN

## 2017-01-14 MED ORDER — GUAIFENESIN-DM 100-10 MG/5ML PO SYRP: 5.0000 mL | ORAL_SOLUTION | Freq: Three times a day (TID) | ORAL | 0 refills | Status: AC | PRN

## 2017-01-14 MED ORDER — SENNOSIDES 8.8 MG/5ML PO SYRP: 5.0000 mL | ORAL_SOLUTION | Freq: Every day | ORAL | 0 refills | Status: AC | PRN

## 2017-01-14 MED ORDER — MINERAL OIL RE ENEM: 1.0000 | ENEMA | Freq: Two times a day (BID) | RECTAL | 0 refills | Status: AC | PRN

## 2017-01-14 MED ORDER — JEVITY 1.2 CAL PO LIQD: 1000.0000 mL | ORAL | 0 refills | Status: AC

## 2017-01-14 MED ORDER — PANTOPRAZOLE SODIUM 40 MG PO PACK: 40.0000 mg | PACK | Freq: Two times a day (BID) | ORAL | Status: AC

## 2017-01-14 MED ORDER — METOCLOPRAMIDE HCL 5 MG/5ML PO SOLN: 10.0000 mg | Freq: Three times a day (TID) | ORAL | 0 refills | Status: AC

## 2017-01-14 MED ORDER — LORAZEPAM 1 MG PO TABS: 1.0000 mg | ORAL_TABLET | ORAL | 0 refills | Status: AC | PRN

## 2017-01-14 MED ORDER — LORAZEPAM 1 MG PO TABS: 1.0000 mg | ORAL_TABLET | ORAL | 0 refills | Status: DC | PRN

## 2017-01-14 MED ORDER — MORPHINE SULFATE 10 MG/5ML PO SOLN: 2.5000 mg | ORAL | 0 refills | Status: DC | PRN

## 2017-01-14 MED ORDER — IPRATROPIUM-ALBUTEROL 0.5-2.5 (3) MG/3ML IN SOLN: 3.0000 mL | Freq: Four times a day (QID) | RESPIRATORY_TRACT | Status: DC | PRN

## 2017-01-14 MED ORDER — ZINC OXIDE 20 % EX OINT: TOPICAL_OINTMENT | Freq: Two times a day (BID) | CUTANEOUS | 0 refills | Status: AC

## 2017-01-14 MED ORDER — ALBUTEROL SULFATE (2.5 MG/3ML) 0.083% IN NEBU: 2.5000 mg | INHALATION_SOLUTION | RESPIRATORY_TRACT | 12 refills | Status: AC | PRN

## 2017-01-14 MED ORDER — DOCUSATE SODIUM 50 MG/5ML PO LIQD: 50.0000 mg | Freq: Every day | ORAL | 0 refills | Status: AC | PRN

## 2017-01-14 NOTE — Discharge Summary (Signed)
DISCHARGE SUMMARY  Marc Rose  MR#: [id number]  DOB:03/31/1951  Date of Admission: 01/10/2017 Date of Discharge: 01/14/2017  Attending Physician:Nazier Neyhart T  Patient's OJJ:[id number], [name]  Consults: Palliative Care   Disposition: return to [facility]   Follow-up Appts: Contact information for after-discharge care    [facility] .   Specialty:  Skilled Nursing Engineer, manufacturing information: [place]. [place] [phone]              Discharge Diagnoses: DNR - NO CODE BLUE - DO NOT HOSPITALIZE Reccurrent aspiration pneumonitis +/- PNA - L mid lung Chronic Hypoxic respiratory failure Proteus in Urine Cx - contaminant v/s UTI CHRONIC Hypotension / Orthostasis  Anemia due to chronic illness  Severe malnutrition in context of chronic illness Unstageable Foot Ulcer  DM2 Hypothyroidism  Initial presentation: 66yo M with history of Guillain-Barre Syndrome (06/2016) for which he has a Tracheostomy and PEG and is on Chronic Mechanical ventilation living at [facility]. He also has a h/o Anxiety, OA, Chronic pain, DM, HTN, PAD, and Orthostatic hypotension. He was transferred from [facility] to the ER again on 9/10 w/ reports of hypotension and blood in his foley catheter.  A CXR at [place] reportedly noted "increasing diffuse interstitial opacities."  In the ED at [facility] a CXR actually suggested overall improved aeration compared to prior CXRs, but w/ a possible new opacity in the L mid lung.    Hospital Course:  Reccurrent aspiration pneumonitis +/- PNA - L mid lung I doubt this pt has a new lung infection, but instead likely an aspiration pneumonitis - completed a 3 day course of abx tx - Palliative Care consulted and spoke w/ POA/family, as well as pt - it is recognized that aspiration will continue (reflux of tube feeding noted during this hospital stay) and that we are powerless to cure this  issue - in light of his overall declining status, the patient and all parties agreed that comfort focused care was most appropriate - he is NOT TO BE HOSPITALIZED again following this discharge   Chronic Hypoxic respiratory failure Stable on trach collar - baseline support reportedly 35% FiO2  Proteus in Urine Cx W/ his hx will be difficult to distinguish colonizer from pathogen - w/ high colony count abx were continued to complete a 4 day course    CHRONIC Hypotension / Orthostasis  On daily florinef - BP stable   Anemia  baseline Hgb 8-10 - no evidence of acute blood loss   Severe malnutrition in context of chronic illness PEG tube dependent - continue TF as able, accepting that recurrent aspiration will occur   Unstageable Foot Ulcer  Continue dressing changes and pressure relief boots  DM2 CBG well controlled   Hypothyroidism Continue synthroid   Allergies as of 01/14/2017      Reactions   Codeine Anaphylaxis, Hives, Swelling   Penicillins Anaphylaxis, Hives, Swelling   Has patient had a PCN reaction causing immediate rash, facial/tongue/throat swelling, SOB or lightheadedness with hypotension: Yes Has patient had a PCN reaction causing severe rash involving mucus membranes or skin necrosis: Unknown Has patient had a PCN reaction that required hospitalization: Unknown Has patient had a PCN reaction occurring within the last 10 years: Unknown If all of the above answers are "NO", then may proceed with Cephalosporin use.   Latex Itching   Strawberry Extract Hives      Medication List    STOP taking these medications  aspirin 81 MG chewable tablet   CALMOSEPTINE 0.44-20.6 % Oint Generic drug:  Menthol-Zinc Oxide   clonazePAM 0.5 MG tablet Commonly known as:  KLONOPIN   fludrocortisone 0.1 MG tablet Commonly known as:  FLORINEF   fluticasone 50 MCG/ACT nasal spray Commonly known as:  FLONASE   ipratropium-albuterol 0.5-2.5 (3) MG/3ML Soln Commonly known  as:  DUONEB   LORazepam 2 MG/ML concentrated solution Commonly known as:  ATIVAN Replaced by:  LORazepam 1 MG tablet   metoCLOPramide 5 MG/ML injection Commonly known as:  REGLAN Replaced by:  metoCLOPramide 5 MG/5ML solution   multivitamin with minerals Tabs tablet   NEOSPORIN LT 1.5-1 % Oint Generic drug:  Allantoin-Pramoxine   pantoprazole 40 MG injection Commonly known as:  PROTONIX Replaced by:  pantoprazole sodium 40 mg/20 mL Pack   polyethylene glycol packet Commonly known as:  MIRALAX / GLYCOLAX   senna-docusate 8.6-50 MG tablet Commonly known as:  Senokot-S   sucralfate 1 g tablet Commonly known as:  CARAFATE   thiamine 100 MG tablet Commonly known as:  VITAMIN B-1   vitamin C 500 MG tablet Commonly known as:  ASCORBIC ACID   zinc sulfate 220 (50 Zn) MG capsule     TAKE these medications   acetaminophen 650 MG CR tablet Commonly known as:  TYLENOL 650 mg See admin instructions. 650 mg per PEG-Tube every 8 hours as needed for pain   albuterol (2.5 MG/3ML) 0.083% nebulizer solution Commonly known as:  PROVENTIL Take 3 mLs (2.5 mg total) by nebulization every 4 (four) hours as needed for wheezing or shortness of breath.   bisacodyl 10 MG suppository Commonly known as:  DULCOLAX Place 10 mg rectally as needed for moderate constipation.   chlorhexidine 0.12 % solution Commonly known as:  PERIDEX Use as directed 15 mLs in the mouth or throat 2 (two) times daily.   diphenhydrAMINE 25 MG tablet Commonly known as:  BENADRYL 25 mg See admin instructions. 25 mg per PEG-Tube every 6 hours as needed for itching   docusate 50 MG/5ML liquid Commonly known as:  COLACE Place 5 mLs (50 mg total) into feeding tube daily as needed for mild constipation.   feeding supplement (JEVITY 1.2 CAL) Liqd Place 1,000 mLs into feeding tube continuous. What changed:  how much to take  when to take this  additional instructions   feeding supplement (PRO-STAT SUGAR FREE  64) Liqd Place 30 mLs into feeding tube daily. What changed:  Another medication with the same name was removed. Continue taking this medication, and follow the directions you see here.   gabapentin 300 MG capsule Commonly known as:  NEURONTIN Place 300 mg into feeding tube 3 (three) times daily.   guaiFENesin-dextromethorphan 100-10 MG/5ML syrup Commonly known as:  ROBITUSSIN DM Place 5 mLs into feeding tube every 8 (eight) hours as needed for cough.   levothyroxine 75 MCG tablet Commonly known as:  SYNTHROID, LEVOTHROID Place 75 mcg into feeding tube daily before breakfast.   LORazepam 1 MG tablet Commonly known as:  ATIVAN Place 1 tablet (1 mg total) into feeding tube every 4 (four) hours as needed for anxiety. Replaces:  LORazepam 2 MG/ML concentrated solution   metoCLOPramide 5 MG/5ML solution Commonly known as:  REGLAN Place 10 mLs (10 mg total) into feeding tube every 8 (eight) hours. Replaces:  metoCLOPramide 5 MG/ML injection   mineral oil enema Place 133 mLs (1 enema total) rectally every 12 (twelve) hours as needed for severe constipation. What changed:  when to  take this  reasons to take this   morphine 10 MG/5ML solution Place 1.3 mLs (2.6 mg total) into feeding tube every 2 (two) hours as needed for moderate pain (breathlessness).   ondansetron 4 MG tablet Commonly known as:  ZOFRAN Place 1 tablet (4 mg total) into feeding tube every 6 (six) hours as needed for nausea.   pantoprazole sodium 40 mg/20 mL Pack Commonly known as:  PROTONIX Place 20 mLs (40 mg total) into feeding tube 2 (two) times daily. Replaces:  pantoprazole 40 MG injection   polyvinyl alcohol 1.4 % ophthalmic solution Commonly known as:  LIQUIFILM TEARS Place 1 drop into both eyes 3 (three) times daily.   QUEtiapine 50 MG tablet Commonly known as:  SEROQUEL Place 50 mg into feeding tube 2 (two) times daily.   scopolamine 1 MG/3DAYS Commonly known as:  TRANSDERM-SCOP Place 1 patch  (1.5 mg total) onto the skin every 3 (three) days.   sennosides 8.8 MG/5ML syrup Commonly known as:  SENOKOT Place 5 mLs into feeding tube daily as needed for mild constipation.   sertraline 50 MG tablet Commonly known as:  ZOLOFT Place 50 mg into feeding tube daily.   simethicone 80 MG chewable tablet Commonly known as:  MYLICON Place 244 mg into feeding tube 2 times daily at 12 noon and 4 pm.   vitamin A & D ointment Apply 1 application topically daily.   zinc oxide 20 % ointment Apply topically 2 (two) times daily.            Discharge Care Instructions        Start     Ordered   01/15/17 0000  Amino Acids-Protein Hydrolys (FEEDING SUPPLEMENT, PRO-STAT SUGAR FREE 64,) LIQD  Daily     01/14/17 1038   01/14/17 0000  albuterol (PROVENTIL) (2.5 MG/3ML) 0.083% nebulizer solution  Every 4 hours PRN     01/14/17 1038   01/14/17 0000  docusate (COLACE) 50 MG/5ML liquid  Daily PRN     01/14/17 1038   01/14/17 0000  sennosides (SENOKOT) 8.8 MG/5ML syrup  Daily PRN     01/14/17 1038   01/14/17 0000  guaiFENesin-dextromethorphan (ROBITUSSIN DM) 100-10 MG/5ML syrup  Every 8 hours PRN     01/14/17 1038   01/14/17 0000  LORazepam (ATIVAN) 1 MG tablet  Every 4 hours PRN     01/14/17 1038   01/14/17 0000  metoCLOPramide (REGLAN) 5 MG/5ML solution  Every 8 hours     01/14/17 1038   01/14/17 0000  mineral oil enema  Every 12 hours PRN     01/14/17 1038   01/14/17 0000  morphine 10 MG/5ML solution  Every 2 hours PRN     01/14/17 1038   01/14/17 0000  Nutritional Supplements (FEEDING SUPPLEMENT, JEVITY 1.2 CAL,) LIQD  Continuous     01/14/17 1038   01/14/17 0000  pantoprazole sodium (PROTONIX) 40 mg/20 mL PACK  2 times daily     01/14/17 1038   01/14/17 0000  zinc oxide 20 % ointment  2 times daily     01/14/17 1038      Day of Discharge BP 131/90 (BP Location: Right Arm)   Pulse 64   Temp 98.6 F (37 C) (Oral)   Resp 18   Ht 6\' 2"  (1.88 m)   Wt 65.9 kg (145 lb 3.2 oz)    SpO2 99%   BMI 18.64 kg/m   Physical Exam: General: No acute respiratory distress at rest in bed  Lungs: poor air movement B bases - no wheezing  Cardiovascular: Regular rate and rhythm without murmur  Abdomen: Nontender, very thin, soft, bowel sounds positive Extremities: No significant edema bilateral lower extremities  Basic Metabolic Panel:  Recent Labs Lab 01/10/17 1559 01/11/17 0048 01/13/17 0219  NA 138 139 139  K 3.8 3.8 3.3*  CL 101 108 109  CO2 29 24 25   GLUCOSE 132* 94 95  BUN 65* 53* 27*  CREATININE 1.05 0.76 0.52*  CALCIUM 8.6* 8.1* 8.3*    Liver Function Tests:  Recent Labs Lab 01/10/17 1559 01/11/17 0048  AST 24 23  ALT 19 20  ALKPHOS 79 69  BILITOT 0.3 0.4  PROT 6.1* 5.7*  ALBUMIN 2.2* 2.1*    Coags:  Recent Labs Lab 01/10/17 1559  INR 1.33   CBC:  Recent Labs Lab 01/10/17 1559 01/11/17 0048 01/11/17 1010 01/11/17 1839 01/13/17 0219  WBC 13.8* 10.8* 7.3 7.4 4.8  NEUTROABS 12.0*  --   --   --   --   HGB 7.3* 7.8* 8.2* 8.6* 8.6*  HCT 22.6* 24.5*  28.9* 26.1* 27.2* 27.3*  MCV 86.6 87.2 87.0 87.2 86.4  PLT 290 261 235 269 296    Cardiac Enzymes:  Recent Labs Lab 01/11/17 0048 01/11/17 1010  TROPONINI <0.03 <0.03    CBG:  Recent Labs Lab 01/13/17 1656 01/13/17 2038 01/14/17 0027 01/14/17 0352 01/14/17 0815  GLUCAP 107* 91 101* 108* 101*    Recent Results (from the past 240 hour(s))  Culture, blood (Routine x 2)     Status: None (Preliminary result)   Collection Time: 01/10/17  3:59 PM  Result Value Ref Range Status   Specimen Description BLOOD RIGHT FOREARM  Final   Special Requests   Final    BOTTLES DRAWN AEROBIC AND ANAEROBIC Blood Culture adequate volume   Culture NO GROWTH 4 DAYS  Final   Report Status PENDING  Incomplete  Culture, blood (Routine x 2)     Status: None (Preliminary result)   Collection Time: 01/10/17  4:02 PM  Result Value Ref Range Status   Specimen Description BLOOD LEFT FOREARM   Final   Special Requests   Final    BOTTLES DRAWN AEROBIC AND ANAEROBIC Blood Culture adequate volume   Culture NO GROWTH 4 DAYS  Final   Report Status PENDING  Incomplete  Urine culture     Status: Abnormal   Collection Time: 01/10/17  5:25 PM  Result Value Ref Range Status   Specimen Description URINE, CATHETERIZED  Final   Special Requests NONE  Final   Culture >=100,000 COLONIES/mL PROTEUS MIRABILIS (A)  Final   Report Status 01/13/2017 FINAL  Final   Organism ID, Bacteria PROTEUS MIRABILIS (A)  Final      Susceptibility   Proteus mirabilis - MIC*    AMPICILLIN <=2 SENSITIVE Sensitive     CEFAZOLIN <=4 SENSITIVE Sensitive     CEFTRIAXONE <=1 SENSITIVE Sensitive     CIPROFLOXACIN >=4 RESISTANT Resistant     GENTAMICIN <=1 SENSITIVE Sensitive     IMIPENEM 2 SENSITIVE Sensitive     NITROFURANTOIN 128 RESISTANT Resistant     TRIMETH/SULFA <=20 SENSITIVE Sensitive     AMPICILLIN/SULBACTAM <=2 SENSITIVE Sensitive     PIP/TAZO <=4 SENSITIVE Sensitive     * >=100,000 COLONIES/mL PROTEUS MIRABILIS  Culture, respiratory (NON-Expectorated)     Status: None   Collection Time: 01/10/17 11:44 PM  Result Value Ref Range Status   Specimen Description TRACHEAL ASPIRATE  Final   Special Requests NONE  Final   Gram Stain   Final    ABUNDANT WBC PRESENT, PREDOMINANTLY PMN FEW SQUAMOUS EPITHELIAL CELLS PRESENT FEW GRAM POSITIVE RODS RARE GRAM POSITIVE COCCI RARE GRAM NEGATIVE RODS RARE YEAST    Culture FEW Consistent with normal respiratory flora.  Final   Report Status 01/13/2017 FINAL  Final  Aerobic Culture (superficial specimen)     Status: Abnormal (Preliminary result)   Collection Time: 01/11/17  4:42 PM  Result Value Ref Range Status   Specimen Description PEG SITE  Final   Special Requests Normal  Final   Gram Stain   Final    RARE WBC PRESENT,BOTH PMN AND MONONUCLEAR NO ORGANISMS SEEN    Culture MULTIPLE ORGANISMS PRESENT, NONE PREDOMINANT (A)  Final   Report Status  PENDING  Incomplete     Time spent in discharge (includes decision making & examination of pt): 35 minutes  01/14/2017, 10:38 AM   Cherene Altes, MD [facility] Office  [phone] Pager [phone]  On-Call/Text Page:      [email or web address]      password [facility]

## 2017-01-14 NOTE — Progress Notes (Signed)
Patient will discharge back to [facility]. Anticipated discharge date: 01/14/17 Family notified: Chauncy Lean, sister Transportation by: [facility]  Nurse to call report to [phone].   CSW signing off.  Estanislado Emms, [place]  Clinical Social Worker

## 2017-01-14 NOTE — Care Management Note (Signed)
Case Management Note Marvetta Gibbons RN, BSN Unit 4E-Case Manager [phone]  Patient Details  Name: Marc Rose MRN: [id number] Date of Birth: 1951/02/25  Subjective/Objective:   Pt  Admitted with hypotension and ?pna- pt with hx of Guillain-Barre syndrome, with trach and PEG.                  Action/Plan: PTA pt from [facility]- on last admission pt was denied [facility]ACH by insurance and there was no peer 2 peer attempted by discharging MD. With readmission within 30 days may be worth looking at [facility] again with insurance??- will see how pt progresses.  Have spoken with [facility] liaison who will follow along.   Expected Discharge Date:  01/14/17               Expected Discharge Plan:  [place]  In-House Referral:  Clinical Social Work  Discharge planning Services  CM Consult  Post Acute Care Choice:  NA Choice offered to:  NA  DME Arranged:    DME Agency:     HH Arranged:    [place] Agency:     Status of Service:  Completed, signed off  If discussed at [employer], dates discussed:    Discharge Disposition: skilled facility   Additional Comments:  01/14/17- [place], CM- pt ready for d/c back to [facility]- CSW following for d/c needs for return to [facility]- pt to transport via [facility].   Dawayne Patricia, RN 01/14/2017, 1:37 PM

## 2017-01-14 NOTE — Clinical Social Work Placement (Signed)
   CLINICAL SOCIAL WORK PLACEMENT  NOTE  Date:  01/14/2017  Patient Details  Name: Marc Rose MRN: [id number] Date of Birth: Nov 18, 1950  Clinical Social Work is seeking post-discharge placement for this patient at the [place] level of care (*CSW will initial, date and re-position this form in  chart as items are completed):  Yes   Patient/family provided with [place] Work Department's list of facilities offering this level of care within the geographic area requested by the patient (or if unable, by the patient's family).  Yes   Patient/family informed of their freedom to choose among providers that offer the needed level of care, that participate in Medicare, Medicaid or managed care program needed by the patient, have an available bed and are willing to accept the patient.  Yes   Patient/family informed of [facility]'s ownership interest in [facility] and [facility], as well as of the fact that they are under no obligation to receive care at these facilities.  PASRR submitted to EDS on       PASRR number received on       Existing PASRR number confirmed on 01/13/17     FL2 transmitted to all facilities in geographic area requested by pt/family on       FL2 transmitted to all facilities within larger geographic area on       Patient informed that his/her managed care company has contracts with or will negotiate with certain facilities, including the following:  [facility]     Yes   Patient/family informed of bed offers received.  Patient chooses bed at [place]     Physician recommends and patient chooses bed at      Patient to be transferred to [facility] on 01/14/17.  Patient to be transferred to facility by [facility]     Patient family notified on 01/14/17 of transfer.  Name of family member notified:  Chauncy Lean, sister      PHYSICIAN       Additional Comment:    _______________________________________________ Estanislado Emms, LCSW 01/14/2017, 1:11 PM

## 2017-01-14 NOTE — Discharge Instructions (Signed)
Aspiration Pneumonia °Aspiration pneumonia is an infection in your lungs. It occurs when food, liquid, or stomach contents (vomit) are inhaled (aspirated) into your lungs. When these things get into your lungs, swelling (inflammation) and infection can occur. This can make it difficult for you to breathe. Aspiration pneumonia is a serious condition and can be life threatening. °What increases the risk? °Aspiration pneumonia is more likely to occur when a person's cough (gag) reflex or ability to swallow has been decreased. Some things that can do this include: °· Having a brain injury or disease, such as stroke, seizures, Parkinson's disease, dementia, or amyotrophic lateral sclerosis (ALS). °· Being given general anesthetic for procedures. °· Being in a coma (unconscious). °· Having a narrowing of the tube that carries food to the stomach (esophagus). °· Drinking too much alcohol. If a person passes out and vomits, vomit can be swallowed into the lungs. °· Taking certain medicines, such as tranquilizers or sedatives. ° °What are the signs or symptoms? °· Coughing after swallowing food or liquids. °· Breathing problems, such as wheezing or shortness of breath. °· Bluish skin. This can be caused by lack of oxygen. °· Coughing up food or mucus. The mucus might contain blood, greenish material, or yellowish-white fluid (pus). °· Fever. °· Chest pain. °· Being more tired than usual (fatigue). °· Sweating more than usual. °· Bad breath. °How is this diagnosed? °A physical exam will be done. During the exam, the health care provider will listen to your lungs with a stethoscope to check for: °· Crackling sounds in the lungs. °· Decreased breath sounds. °· A rapid heartbeat. ° °Various tests may be ordered. These may include: °· Chest X-ray. °· CT scan. °· Swallowing study. This test looks at how food is swallowed and whether it goes into your breathing tube (trachea) or food pipe (esophagus). °· Sputum culture. Saliva and  mucus (sputum) are collected from the lungs or the tubes that carry air to the lungs (bronchi). The sputum is then tested for bacteria. °· Bronchoscopy. This test uses a flexible tube (bronchoscope) to see inside the lungs. ° °How is this treated? °Treatment will usually include antibiotic medicines. Other medicines may also be used to reduce fever or pain. You may need to be treated in the hospital. In the hospital, your breathing will be carefully monitored. Depending on how well you are breathing, you may need to be given oxygen, or you may need breathing support from a breathing machine (ventilator). For people who fail a swallowing study, a feeding tube might be placed in the stomach, or they may be asked to avoid certain food textures or liquids when they eat. °Follow these instructions at home: °· Carefully follow any special eating instructions you were given, such as avoiding certain food textures or thickening liquids. This reduces the risk of developing aspiration pneumonia again. °· Only take over-the-counter or prescription medicines as directed by your health care provider. Follow the directions carefully. °· If you were prescribed antibiotics, take them as directed. Finish them even if you start to feel better. °· Rest as instructed by your health care provider. °· Keep all follow-up appointments with your health care provider. °Contact a health care provider if: °· You develop worsening shortness of breath, wheezing, or difficulty breathing. °· You develop a fever. °· You have chest pain. °This information is not intended to replace advice given to you by your health care provider. Make sure you discuss any questions you have with your health care   provider. °Document Released: 02/14/2009 Document Revised: 10/01/2015 Document Reviewed: 10/05/2012 °Elsevier Interactive Patient Education © 2017 Elsevier Inc. ° °

## 2017-01-15 LAB — AEROBIC CULTURE W GRAM STAIN (SUPERFICIAL SPECIMEN)

## 2017-01-15 LAB — CULTURE, BLOOD (ROUTINE X 2)
CULTURE: NO GROWTH
Culture: NO GROWTH
SPECIAL REQUESTS: ADEQUATE
Special Requests: ADEQUATE

## 2017-01-15 LAB — AEROBIC CULTURE  (SUPERFICIAL SPECIMEN): SPECIAL REQUESTS: NORMAL

## 2017-01-17 DIAGNOSIS — Z9911 Dependence on respirator [ventilator] status: Secondary | ICD-10-CM | POA: Diagnosis not present

## 2017-01-17 DIAGNOSIS — J9600 Acute respiratory failure, unspecified whether with hypoxia or hypercapnia: Secondary | ICD-10-CM | POA: Diagnosis not present

## 2017-01-25 DIAGNOSIS — J9600 Acute respiratory failure, unspecified whether with hypoxia or hypercapnia: Secondary | ICD-10-CM | POA: Diagnosis not present

## 2017-01-25 DIAGNOSIS — Z9911 Dependence on respirator [ventilator] status: Secondary | ICD-10-CM | POA: Diagnosis not present

## 2017-01-28 DIAGNOSIS — J9600 Acute respiratory failure, unspecified whether with hypoxia or hypercapnia: Secondary | ICD-10-CM | POA: Diagnosis not present

## 2017-01-28 DIAGNOSIS — Z9911 Dependence on respirator [ventilator] status: Secondary | ICD-10-CM | POA: Diagnosis not present

## 2017-01-31 ENCOUNTER — Other Ambulatory Visit: Payer: Self-pay | Admitting: *Deleted

## 2017-01-31 DIAGNOSIS — M6281 Muscle weakness (generalized): Secondary | ICD-10-CM | POA: Diagnosis not present

## 2017-01-31 DIAGNOSIS — R1312 Dysphagia, oropharyngeal phase: Secondary | ICD-10-CM | POA: Diagnosis not present

## 2017-01-31 DIAGNOSIS — J9620 Acute and chronic respiratory failure, unspecified whether with hypoxia or hypercapnia: Secondary | ICD-10-CM | POA: Diagnosis not present

## 2017-01-31 NOTE — Patient Outreach (Signed)
[facility]) Care Management  01/31/2017  Marc Rose June 04, 1950 [id number]  Received referral from  [place] for high ED frequency:  Per hx: patient is on chronic mechanical ventilation & living in SNF-[facility] stay 9/10-9/14/2018-Recurrent aspiration pneumonitis. Patient was treated with antibiotics, palliative Consult completed. Patient was transferred back to [facility] with care plan-comfort care measures.   Telephone call to care management at [facility]. Advised that patient was in long term care & would not be returning home. States patient is receiving comfort care.   Plan: Send to care management to close out.  Sherrin Daisy, RN BSN [place] Management Coordinator [facility] Care Management  [phone]

## 2017-02-07 DIAGNOSIS — Z7982 Long term (current) use of aspirin: Secondary | ICD-10-CM | POA: Diagnosis not present

## 2017-02-07 DIAGNOSIS — I739 Peripheral vascular disease, unspecified: Secondary | ICD-10-CM | POA: Diagnosis not present

## 2017-02-07 DIAGNOSIS — B351 Tinea unguium: Secondary | ICD-10-CM | POA: Diagnosis not present

## 2017-02-09 DIAGNOSIS — J9600 Acute respiratory failure, unspecified whether with hypoxia or hypercapnia: Secondary | ICD-10-CM | POA: Diagnosis not present

## 2017-02-09 DIAGNOSIS — Z9911 Dependence on respirator [ventilator] status: Secondary | ICD-10-CM | POA: Diagnosis not present

## 2017-02-19 DIAGNOSIS — Z9911 Dependence on respirator [ventilator] status: Secondary | ICD-10-CM | POA: Diagnosis not present

## 2017-02-19 DIAGNOSIS — J9600 Acute respiratory failure, unspecified whether with hypoxia or hypercapnia: Secondary | ICD-10-CM | POA: Diagnosis not present

## 2017-03-03 DEATH — deceased

## 2018-11-05 IMAGING — MR MR HEAD W/O CM
8 of 10 series · 33 of 48 positions shown · non-contrast
Comparison: None.

CLINICAL DATA: 65-year-old male with bilateral upper extremity
weakness. Initial encounter.

EXAM:
MRI HEAD WITHOUT CONTRAST
TECHNIQUE: Multiplanar, multiecho pulse sequences of the brain and surrounding
structures were obtained without intravenous contrast.

[Series 3: DWI · axial · 3.0mm · 0.72mm/px · z∈[-81,+80]mm · 6 of 55 slices shown (1 of 4)]
[im 1/55]
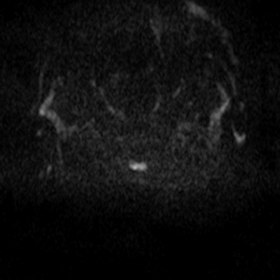
[im 11/55]
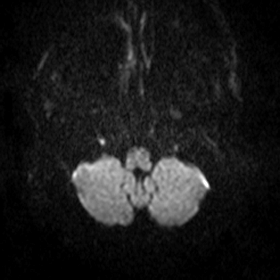
[im 22/55]
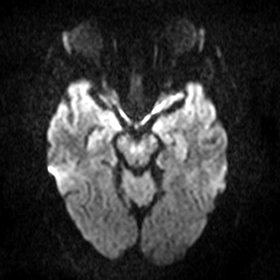
[im 33/55]
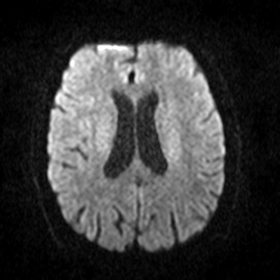
[im 44/55]
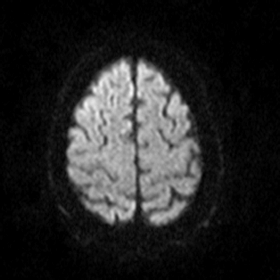
[im 55/55]
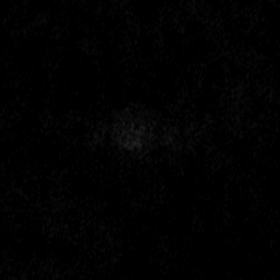

[Series 4: DWI · axial · 3.0mm · 0.72mm/px · z∈[-81,+80]mm · 6 of 54 slices shown (2 of 4)]
[im 1/54]
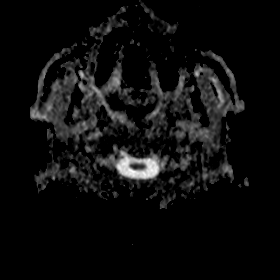
[im 11/54]
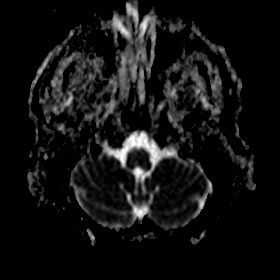
[im 22/54]
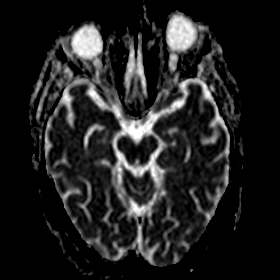
[im 32/54]
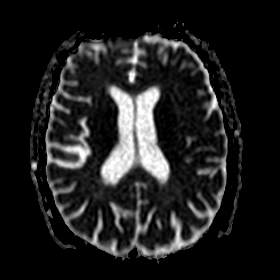
[im 43/54]
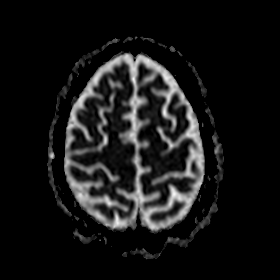
[im 54/54]
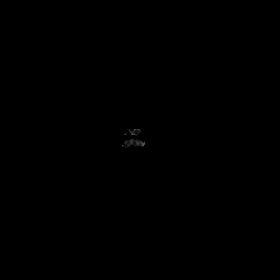

[Series 5: DWI · coronal · 5.0mm · 0.48mm/px · 5 of 38 slices shown (3 of 4)]
[im 1/38]
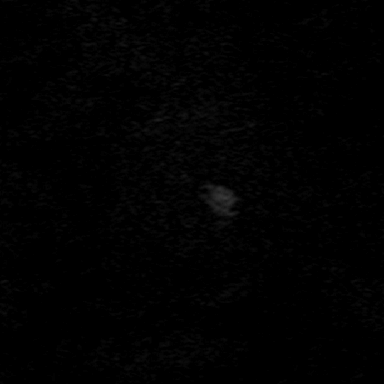
[im 10/38]
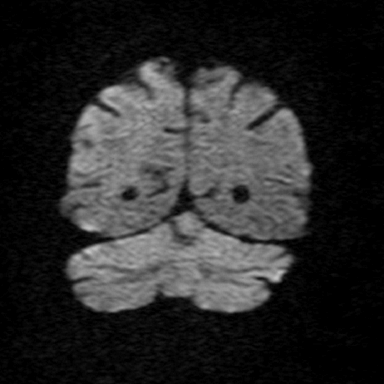
[im 19/38]
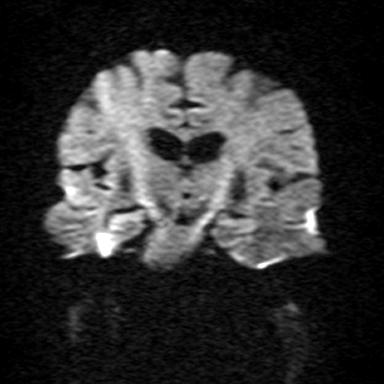
[im 28/38]
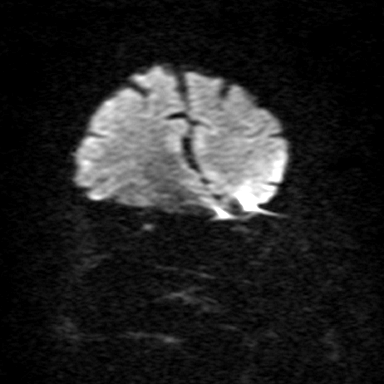
[im 38/38]
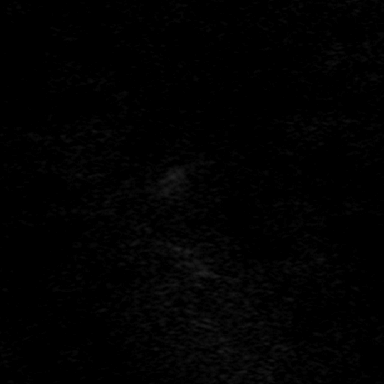

[Series 6: DWI · coronal · 5.0mm · 0.48mm/px · 5 of 37 slices shown (4 of 4)]
[im 1/37]
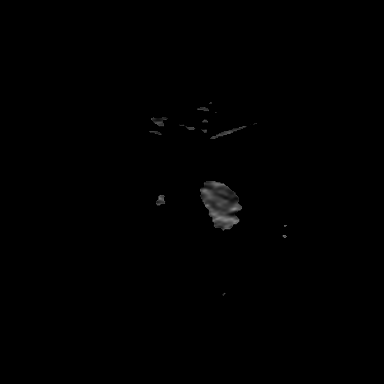
[im 10/37]
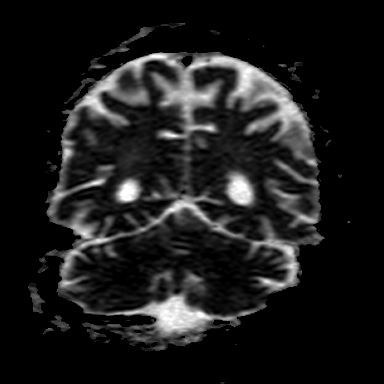
[im 19/37]
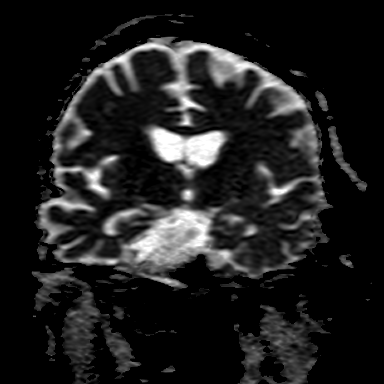
[im 28/37]
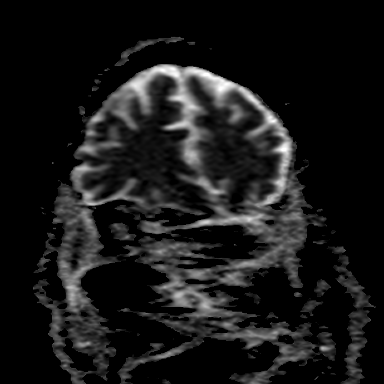
[im 37/37]
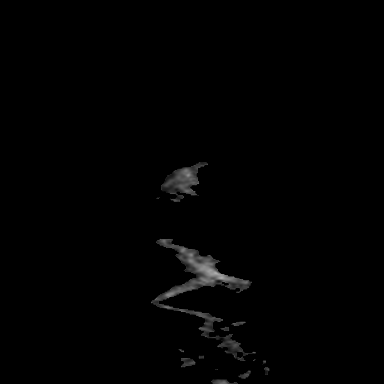

[Series 7: T1 · sagittal · 5.0mm · 0.43mm/px · 1 of 21 slices shown]
[im 1/21]
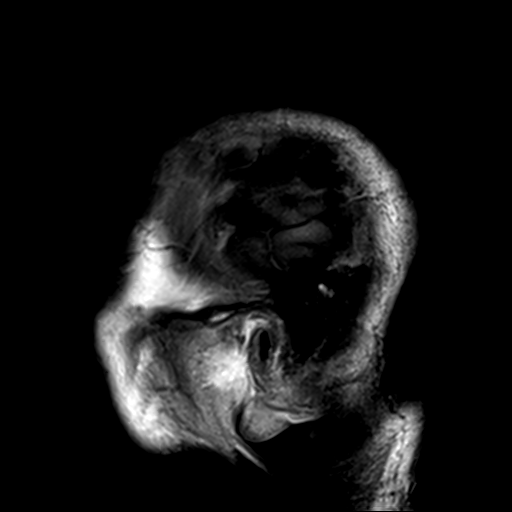

[Series 8: T2 · axial · 5.0mm · 0.50mm/px · z∈[-76,+67]mm · 3 of 23 slices shown (1 of 2)]
[im 1/23]
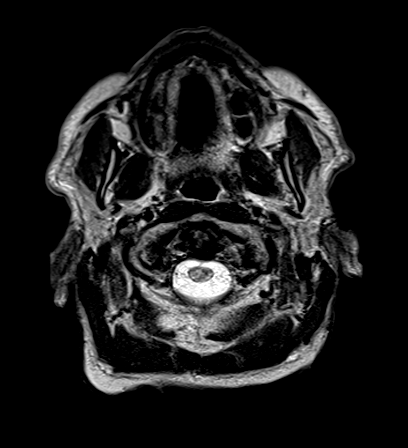
[im 12/23]
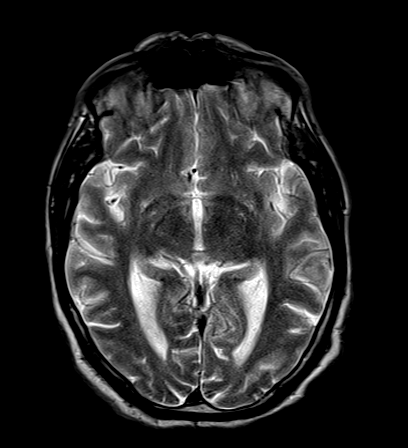
[im 23/23]
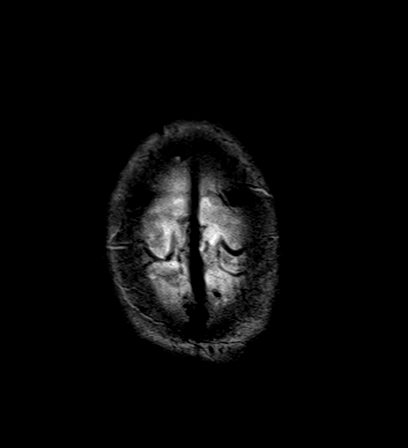

[Series 9: FLAIR · axial · 5.0mm · 0.34mm/px · z∈[-76,+67]mm · 3 of 23 slices shown]
[im 1/23]
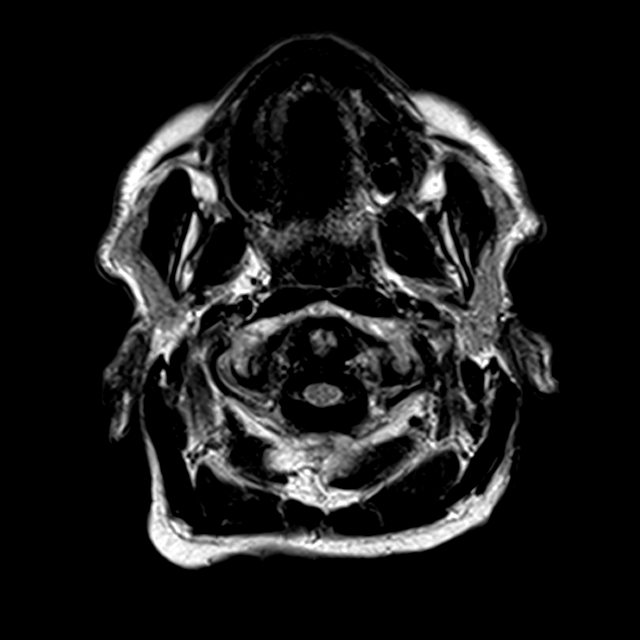
[im 12/23]
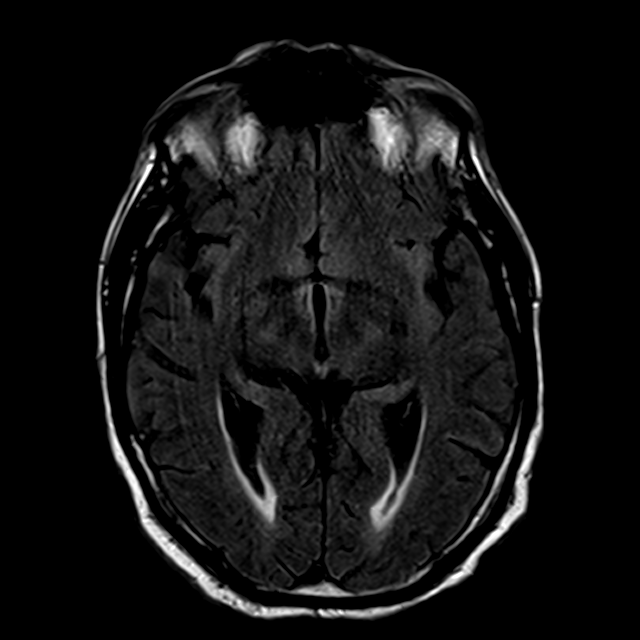
[im 23/23]
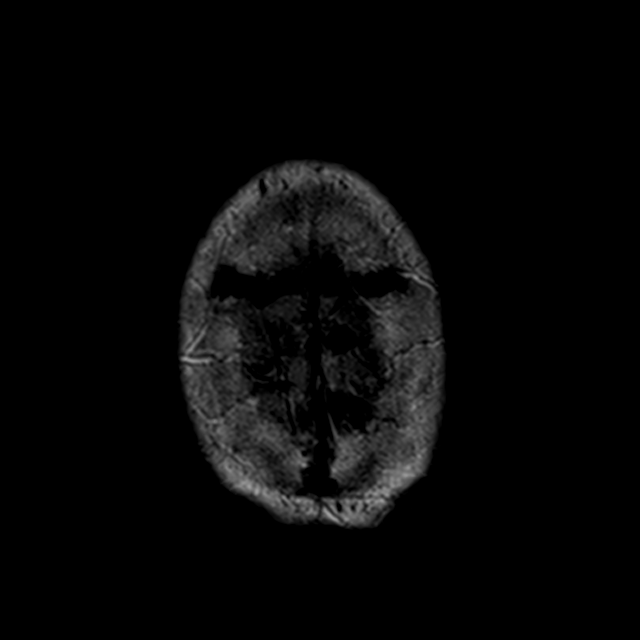

[Series 12: T2 · coronal · 5.0mm · 0.46mm/px · 4 of 28 slices shown (2 of 2)]
[im 1/28]
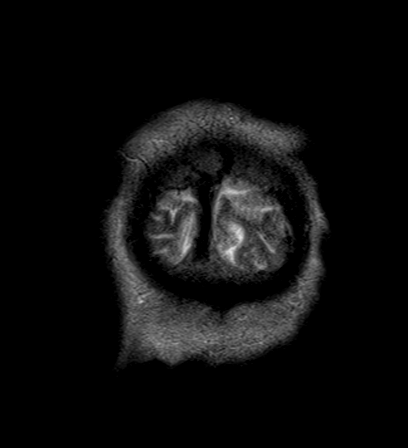
[im 10/28]
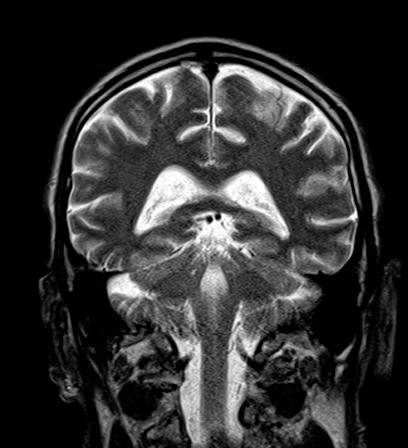
[im 19/28]
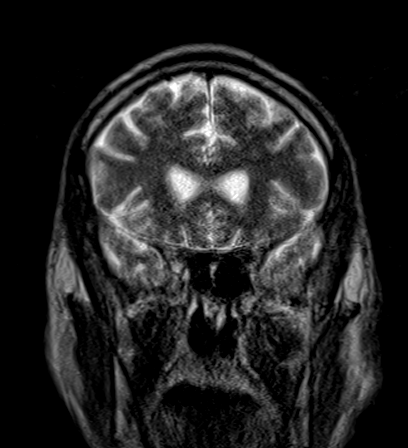
[im 28/28]
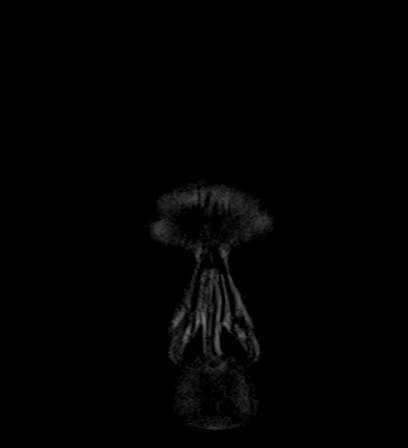

[33 of 48 positions shown; findings below may reference images not displayed]

FINDINGS: Brain: Cerebral volume is within normal limits for age. No
restricted diffusion to suggest acute infarction. No midline shift,
mass effect, evidence of mass lesion, ventriculomegaly, extra-axial
collection or acute intracranial hemorrhage. Cervicomedullary
junction and pituitary are within normal limits.

Gray and white matter signal is within normal limits for age
throughout the brain. No cortical encephalomalacia or chronic
cerebral blood products.

Vascular: Major intracranial vascular flow voids are preserved.

Skull and upper cervical spine: Negative. Normal bone marrow signal.

Sinuses/Orbits: Normal orbits soft tissues. Trace paranasal sinus
mucosal thickening.

Other: Visible internal auditory structures appear normal. Mastoids
are clear. Negative scalp soft tissues.
IMPRESSION: No acute intracranial abnormality. Normal for age noncontrast MRI
appearance of the brain.
